# Patient Record
Sex: Male | Born: 1990 | Race: Black or African American | Hispanic: No | Marital: Single | Smoking: Never smoker
Health system: Southern US, Community
[De-identification: ages and names within clinical notes are randomized; demographics above are authoritative.]

## PROBLEM LIST (undated history)

## (undated) DIAGNOSIS — R45851 Suicidal ideations: Secondary | ICD-10-CM

## (undated) DIAGNOSIS — F603 Borderline personality disorder: Secondary | ICD-10-CM

## (undated) DIAGNOSIS — F319 Bipolar disorder, unspecified: Secondary | ICD-10-CM

## (undated) DIAGNOSIS — J45909 Unspecified asthma, uncomplicated: Secondary | ICD-10-CM

## (undated) DIAGNOSIS — F913 Oppositional defiant disorder: Secondary | ICD-10-CM

## (undated) DIAGNOSIS — F909 Attention-deficit hyperactivity disorder, unspecified type: Secondary | ICD-10-CM

## (undated) DIAGNOSIS — F209 Schizophrenia, unspecified: Secondary | ICD-10-CM

## (undated) DIAGNOSIS — R45850 Homicidal ideations: Secondary | ICD-10-CM

---

## 2013-07-01 ENCOUNTER — Encounter (HOSPITAL_COMMUNITY): Payer: Self-pay | Admitting: Emergency Medicine

## 2013-07-01 ENCOUNTER — Emergency Department (HOSPITAL_COMMUNITY)
Admission: EM | Admit: 2013-07-01 | Discharge: 2013-07-01 | Disposition: A | Discharge status: Home / Self Care | Payer: Medicaid Other | Attending: Emergency Medicine | Admitting: Emergency Medicine

## 2013-07-01 DIAGNOSIS — J45909 Unspecified asthma, uncomplicated: Secondary | ICD-10-CM | POA: Diagnosis not present

## 2013-07-01 DIAGNOSIS — M545 Low back pain, unspecified: Secondary | ICD-10-CM | POA: Diagnosis present

## 2013-07-01 DIAGNOSIS — G8929 Other chronic pain: Secondary | ICD-10-CM | POA: Insufficient documentation

## 2013-07-01 DIAGNOSIS — R52 Pain, unspecified: Secondary | ICD-10-CM | POA: Diagnosis not present

## 2013-07-01 HISTORY — DX: Unspecified asthma, uncomplicated: J45.909

## 2013-07-01 MED ORDER — METHOCARBAMOL 500 MG PO TABS: 1000.0000 mg | ORAL_TABLET | Freq: Four times a day (QID) | ORAL | Status: DC | PRN

## 2013-07-01 NOTE — ED Notes (Signed)
i just triaged this pt no change

## 2013-07-01 NOTE — ED Provider Notes (Signed)
CSN: [id number]     Arrival date & time 07/01/13  1701 History   First MD Initiated Contact with Patient 07/01/13 1711  This chart was scribed for non-physician practitioner Wynetta EmeryNicole Dawnisha Marquina, PA-C working with Merrie RoofJohn David Wofford III, * by Valera CastleSteven Perry, ED scribe. This patient was seen in room TR05C/TR05C and the patient's care was started at 5:21 PM.    Chief Complaint  Patient presents with  . Back Pain   (Consider location/radiation/quality/duration/timing/severity/associated sxs/prior Treatment) The history is provided by the patient. No language interpreter was used.   HPI Comments: [name] is a 23 y.o. male who presents to the Emergency Department complaining of 8/10, sharp, pulling, lower back pain, onset 3 weeks ago. He reports he used to have back pain, but states that it had subsided for some time. He denies his back pain radiating to his LE. He denies any recent injuries and trauma to his back. He reports taking Motrin for his pain without relief. He denies any associated symptoms. He denies allergies to medications. He denies having a PCP due to not having insurance. He reports being in the process of applying for insurance. Denies fever, chills, change in bowel or bladder habits, h/o IDVU or cancer, numbness or weakness.   PCP - No PCP Per Patient  Past Medical History  Diagnosis Date  . Asthma    History reviewed. No pertinent past surgical history. No family history on file. History  Substance Use Topics  . Smoking status: Never Smoker   . Smokeless tobacco: Not on file  . Alcohol Use: Yes    Review of Systems  Musculoskeletal: Positive for back pain (lower). Negative for gait problem, joint swelling and neck pain.  Skin: Negative for wound.   Allergies  Review of patient's allergies indicates no known allergies.  Home Medications   Prior to Admission medications   Not on File   BP 148/68  Pulse 86  Temp(Src) 98.7 F (37.1 C) (Oral)  Resp 18  Wt 220  lb (99.791 kg)  SpO2 98%  Physical Exam  Nursing note and vitals reviewed. Constitutional: He is oriented to person, place, and time. He appears well-developed and well-nourished. No distress.  HENT:  Head: Normocephalic and atraumatic.  Mouth/Throat: Oropharynx is clear and moist.  Eyes: EOM are normal. Pupils are equal, round, and reactive to light.  Neck: Normal range of motion. Neck supple.  Cardiovascular: Normal rate, regular rhythm and intact distal pulses.   Pulmonary/Chest: Effort normal. No respiratory distress.  Musculoskeletal: Normal range of motion. He exhibits no edema.  No point tenderness to percussion of lumbar spinal processes.  No TTP or paraspinal muscular spasm. Strength is 5 out of 5 to bilateral lower extremities at hip and knee No saddle anaesthesia.     Ambulates with non-antalgic gait   Neurological: He is alert and oriented to person, place, and time.  Skin: Skin is warm and dry.  Psychiatric: He has a normal mood and affect. His behavior is normal.    ED Course  Procedures (including critical care time)  DIAGNOSTIC STUDIES: Oxygen Saturation is 98% on room air, normal by my interpretation.    COORDINATION OF CARE: 5:24 PM-Discussed treatment plan which includes pain medication, muscle relaxant, and [facility] bedside and pt agreed to plan.   Labs Review Labs Reviewed - No data to display  Imaging Review No results found.   EKG Interpretation None     Medications - No data  to display MDM   Final diagnoses:  Acute exacerbation of chronic low back pain    Filed Vitals:   07/01/13 1709  BP: 148/68  Pulse: 86  Temp: 98.7 F (37.1 C)  TempSrc: Oral  Resp: 18  Weight: 220 lb (99.791 kg)  SpO2: 98%    Medications - No data to display  [name] is a 23 y.o. male presenting with  back pain.  Declines Toradol. No neurological deficits and normal neuro exam.  Patient can walk but states is painful.   No loss of bowel or bladder control.  No concern for cauda equina.  No fever, night sweats, weight loss, h/o cancer, IVDU.  RICE protocol and pain medicine indicated and discussed with patient.  Evaluation does not show pathology that would require ongoing emergent intervention or inpatient treatment. Pt is hemodynamically stable and mentating appropriately. Discussed findings and plan with patient/guardian, who agrees with care plan. All questions answered. Return precautions discussed and outpatient follow up given.    Note: Portions of this report may have been transcribed using voice recognition software. Every effort was made to ensure accuracy; however, inadvertent computerized transcription errors may be present   I personally performed the services described in this documentation, which was scribed in my presence. The recorded information has been reviewed and is accurate.   Wynetta Emeryicole Evelynn Hench, PA-C 07/01/13 1746

## 2013-07-01 NOTE — ED Notes (Signed)
The pt has had lower back pain for 3 weeks.  No known injury

## 2013-07-01 NOTE — Discharge Instructions (Signed)
For pain control you may take up to 800mg of Motrin (also known as ibuprofen). That is usually 4 over the counter pills,  3 times a day. Take with food to minimize stomach irritation  ° °You can also take  tylenol (acetaminophen) 975mg (this is 3 over the counter pills) four times a day. Do not drink alcohol or combine with other medications that have acetaminophen as an ingredient (Read the labels!).   ° °For breakthrough pain you may take Robaxin. Do not drink alcohol, drive or operate heavy machinery when taking Robaxin. ° °Do not hesitate to return to the Emergency Department for any new, worsening or concerning symptoms.  ° °If you do not have a primary care doctor you can establish one at the  ° °[facility]: °[address] °[place] [address] °[phone] ° °After you establish care. Let them know you were seen in the emergency room. They must obtain records for further management.  ° °

## 2013-07-02 ENCOUNTER — Emergency Department (HOSPITAL_COMMUNITY)
Admission: EM | Admit: 2013-07-02 | Discharge: 2013-07-02 | Disposition: A | Discharge status: Home / Self Care | Payer: Medicaid Other | Attending: Emergency Medicine | Admitting: Emergency Medicine

## 2013-07-02 ENCOUNTER — Encounter (HOSPITAL_COMMUNITY): Payer: Self-pay | Admitting: Emergency Medicine

## 2013-07-02 DIAGNOSIS — M545 Low back pain, unspecified: Secondary | ICD-10-CM | POA: Diagnosis not present

## 2013-07-02 DIAGNOSIS — R51 Headache: Secondary | ICD-10-CM | POA: Diagnosis not present

## 2013-07-02 DIAGNOSIS — R52 Pain, unspecified: Secondary | ICD-10-CM | POA: Diagnosis not present

## 2013-07-02 DIAGNOSIS — R110 Nausea: Secondary | ICD-10-CM | POA: Insufficient documentation

## 2013-07-02 DIAGNOSIS — J45909 Unspecified asthma, uncomplicated: Secondary | ICD-10-CM | POA: Insufficient documentation

## 2013-07-02 DIAGNOSIS — M549 Dorsalgia, unspecified: Secondary | ICD-10-CM

## 2013-07-02 DIAGNOSIS — G8929 Other chronic pain: Secondary | ICD-10-CM | POA: Diagnosis not present

## 2013-07-02 DIAGNOSIS — R519 Headache, unspecified: Secondary | ICD-10-CM

## 2013-07-02 DIAGNOSIS — H53149 Visual discomfort, unspecified: Secondary | ICD-10-CM | POA: Insufficient documentation

## 2013-07-02 LAB — COMPREHENSIVE METABOLIC PANEL
ALK PHOS: 67 U/L (ref 39–117)
ALT: 50 U/L (ref 0–53)
AST: 41 U/L — AB (ref 0–37)
Albumin: 4 g/dL (ref 3.5–5.2)
BILIRUBIN TOTAL: 0.3 mg/dL (ref 0.3–1.2)
BUN: 9 mg/dL (ref 6–23)
CO2: 24 mEq/L (ref 19–32)
CREATININE: 1 mg/dL (ref 0.50–1.35)
Calcium: 9.1 mg/dL (ref 8.4–10.5)
Chloride: 100 mEq/L (ref 96–112)
GFR calc Af Amer: >90 mL/min (ref >90)
GFR calc non Af Amer: >90 mL/min (ref >90)
Glucose, Bld: 79 mg/dL (ref 70–99)
POTASSIUM: 4.6 meq/L (ref 3.7–5.3)
Sodium: 137 mEq/L (ref 137–147)
Total Protein: 7.7 g/dL (ref 6.0–8.3)

## 2013-07-02 LAB — CBC WITH DIFFERENTIAL/PLATELET
BASOS ABS: 0 10*3/uL (ref 0.0–0.1)
BASOS PCT: 0 % (ref 0–1)
Eosinophils Absolute: 0.3 10*3/uL (ref 0.0–0.7)
Eosinophils Relative: 5 % (ref 0–5)
HCT: 46.2 % (ref 39.0–52.0)
Hemoglobin: 15.7 g/dL (ref 13.0–17.0)
Lymphocytes Relative: 37 % (ref 12–46)
Lymphs Abs: 1.9 10*3/uL (ref 0.7–4.0)
MCH: 27.7 pg (ref 26.0–34.0)
MCHC: 34 g/dL (ref 30.0–36.0)
MCV: 81.6 fL (ref 78.0–100.0)
Monocytes Absolute: 0.6 10*3/uL (ref 0.1–1.0)
Monocytes Relative: 11 % (ref 3–12)
NEUTROS ABS: 2.5 10*3/uL (ref 1.7–7.7)
NEUTROS PCT: 47 % (ref 43–77)
Platelets: 311 10*3/uL (ref 150–400)
RBC: 5.66 MIL/uL (ref 4.22–5.81)
RDW: 13.2 % (ref 11.5–15.5)
WBC: 5.2 10*3/uL (ref 4.0–10.5)

## 2013-07-02 LAB — URINALYSIS, ROUTINE W REFLEX MICROSCOPIC
Bilirubin Urine: NEGATIVE
Glucose, UA: NEGATIVE mg/dL
Hgb urine dipstick: NEGATIVE
KETONES UR: NEGATIVE mg/dL
Leukocytes, UA: NEGATIVE
Nitrite: NEGATIVE
PH: 6.5 (ref 5.0–8.0)
Protein, ur: NEGATIVE mg/dL
SPECIFIC GRAVITY, URINE: 1.017 (ref 1.005–1.030)
Urobilinogen, UA: 1 mg/dL (ref 0.0–1.0)

## 2013-07-02 LAB — LIPASE, BLOOD: Lipase: 20 U/L (ref 11–59)

## 2013-07-02 MED ORDER — PROMETHAZINE HCL 25 MG PO TABS: 25.0000 mg | ORAL_TABLET | Freq: Four times a day (QID) | ORAL | Status: DC | PRN

## 2013-07-02 MED ORDER — KETOROLAC TROMETHAMINE 60 MG/2ML IM SOLN
60.0000 mg | Freq: Once | INTRAMUSCULAR | Status: AC
  Administered 2013-07-02: 60 mg via INTRAMUSCULAR
  Filled 2013-07-02: qty 2

## 2013-07-02 MED ORDER — ONDANSETRON 4 MG PO TBDP
8.0000 mg | ORAL_TABLET | Freq: Once | ORAL | Status: AC
  Administered 2013-07-02: 8 mg via ORAL
  Filled 2013-07-02: qty 2

## 2013-07-02 NOTE — ED Provider Notes (Signed)
CSN: [id number]     Arrival date & time 07/02/13  1846 History   First MD Initiated Contact with Patient 07/02/13 2008     Chief Complaint  Patient presents with  . Headache  . Back Pain  . Nausea     (Consider location/radiation/quality/duration/timing/severity/associated sxs/prior Treatment) HPI History provided by pt.   Pt presents w/ multiple complaints.  Has acute on chronic, non-traumatic pain mid-back that radiates down to his buttocks.  Pain typical.  No relief w/ robaxin that was prescribed in ER yesterday for symptomatic treatment of same complaint.  Also c/o constant  frontal/occipital headache since this morning.  Gradual onset.  Associated w/ photophobia and nausea as well as nasal congestion.  Had dizziness at onset as well as abdominal pain this morning,  But both have resolved.  Denies vision changes, extremity weakness/paresthesias, urinary sx, bowel dysfunction, sore throat and cough.  No other pertinent PMH.  Past Medical History  Diagnosis Date  . Asthma    History reviewed. No pertinent past surgical history. No family history on file. History  Substance Use Topics  . Smoking status: Never Smoker   . Smokeless tobacco: Not on file  . Alcohol Use: Yes    Review of Systems  All other systems reviewed and are negative.     Allergies  Review of patient's allergies indicates no known allergies.  Home Medications   Prior to Admission medications   Medication Sig Start Date End Date Taking? Authorizing Provider  methocarbamol (ROBAXIN) 500 MG tablet Take 2 tablets (1,000 mg total) by mouth 4 (four) times daily as needed (Pain). 07/01/13  Yes Nicole Pisciotta, PA-C   BP 120/56  Pulse 80  Temp(Src) 98.9 F (37.2 C) (Oral)  Resp 20  SpO2 100% Physical Exam  Nursing note and vitals reviewed. Constitutional: He is oriented to person, place, and time. He appears well-developed and well-nourished.  HENT:  Head: Normocephalic and atraumatic.  Mild tenderness of  frontal and maxillary sinuses  Eyes:  Normal appearance  Neck: Normal range of motion. Neck supple. No rigidity. No Brudzinski's sign and no Kernig's sign noted.  Cardiovascular: Normal rate, regular rhythm and intact distal pulses.   Pulmonary/Chest: Effort normal and breath sounds normal.  Musculoskeletal: Normal range of motion.  5/5 hip abduction/adduction and ankle plantar/dorsiflexion strength.  Nml patellar reflexes.  No saddle anesthesia.  Lymphadenopathy:    He has no cervical adenopathy.  Neurological: He is alert and oriented to person, place, and time. No sensory deficit. Coordination normal.  CN 3-12 intact.  No nystagmus.  5/5 and equal upper and lower extremity strength.  No past pointing.    Skin: Skin is warm and dry. No rash noted.  Psychiatric: He has a normal mood and affect. His behavior is normal.    ED Course  Procedures (including critical care time) Labs Review Labs Reviewed  COMPREHENSIVE METABOLIC PANEL - Abnormal; Notable for the following:    AST 41 (*)    All other components within normal limits  CBC WITH DIFFERENTIAL  LIPASE, BLOOD  URINALYSIS, ROUTINE W REFLEX MICROSCOPIC    Imaging Review No results found.   EKG Interpretation None      MDM   Final diagnoses:  Headache  Chronic back pain    23yo M w/ asthma and chronic back pain, presents w/ typical acute on chronic back pain as well as gradual onset headache and nausea since this morning.  Seen for back pain in ED yesterday and prescribed robaxin for  symptomatic treatment.  Has not had relief w/ this medication.  On exam, afebrile, NAD and non-toxic appearing, mild sinus ttp, no focal neuro deficits or meningeal signs, abd benign, diffuse, mild tenderness mid-low back.  History and exam are not concerning for central cord, cauda equina, CNS infection, intracranial hemorrhage.  Will treat symptomatically for headache and back pain w/ IM toradol and nausea w/ Zofran ODT and reassess shortly.   8:57 PM   Headache and nausea resolved.  Pt d/c'd home w/ phenergan for nausea and to be taken with benadryl to trial for headache.  Referred to [facility] for chronic back pain.  Return precautions discussed. 10:03 PM   Otilio Miuatherine E Lailani Tool, PA-C 07/03/13 [phone]

## 2013-07-02 NOTE — ED Provider Notes (Signed)
Medical screening examination/treatment/procedure(s) were performed by non-physician practitioner and as supervising physician I was immediately available for consultation/collaboration.   EKG Interpretation None        [name] ChurnJohn David Laytoya Ion III, MD 07/02/13 0001

## 2013-07-02 NOTE — Discharge Instructions (Signed)
Take phenergan as needed for nausea.  If you take with benadryl, it may give you headache relief as well, but this may make you sleepy.  Apply a heating pad to sore back muscles and take an anti-inflammatory such as 800mg  ibuprofen for pain.  Avoid activities that aggravate pain.   Follow up with the neurosurgeon you have been referred to.  Return to the ER if headache worsens, you have uncontrolled vomiting, change from typical back pain, associated fever, inability to walk due to leg weakness or loss of control of bladder/bowels.

## 2013-07-02 NOTE — ED Notes (Signed)
Pt here with multiple complaints. Reports 10/10 progressively worsening HA x 3 hours; denies taking anything. Denies blurred vision, double vision but feels "lightheaded". Ambulatory to triage. Pt also has chronic back pain, seen here yesterday for same but states pain medications didn't work. Pt also reports lower abdominal pain starting 3 hours ago with nausea. NAD. Pt AO x 4. PERRLA.

## 2013-07-06 NOTE — ED Provider Notes (Signed)
Medical screening examination/treatment/procedure(s) were performed by non-physician practitioner and as supervising physician I was immediately available for consultation/collaboration.   EKG Interpretation None        Rito Lecomte W. Berkeley Veldman, MD 07/06/13 0337 

## 2013-07-30 ENCOUNTER — Encounter (HOSPITAL_COMMUNITY): Payer: Self-pay | Admitting: Emergency Medicine

## 2013-07-30 ENCOUNTER — Emergency Department (HOSPITAL_COMMUNITY)
Admission: EM | Admit: 2013-07-30 | Discharge: 2013-08-03 | Disposition: A | Discharge status: Home / Self Care | Payer: Medicaid Other | Attending: Emergency Medicine | Admitting: Emergency Medicine

## 2013-07-30 DIAGNOSIS — R443 Hallucinations, unspecified: Secondary | ICD-10-CM | POA: Insufficient documentation

## 2013-07-30 DIAGNOSIS — J45909 Unspecified asthma, uncomplicated: Secondary | ICD-10-CM | POA: Diagnosis not present

## 2013-07-30 DIAGNOSIS — Z8659 Personal history of other mental and behavioral disorders: Secondary | ICD-10-CM | POA: Insufficient documentation

## 2013-07-30 HISTORY — DX: Bipolar disorder, unspecified: F31.9

## 2013-07-30 HISTORY — DX: Schizophrenia, unspecified: F20.9

## 2013-07-30 HISTORY — DX: Oppositional defiant disorder: F91.3

## 2013-07-30 HISTORY — DX: Attention-deficit hyperactivity disorder, unspecified type: F90.9

## 2013-07-30 LAB — COMPREHENSIVE METABOLIC PANEL
ALK PHOS: 65 U/L (ref 39–117)
ALT: 22 U/L (ref 0–53)
AST: 22 U/L (ref 0–37)
Albumin: 4.2 g/dL (ref 3.5–5.2)
BILIRUBIN TOTAL: 0.6 mg/dL (ref 0.3–1.2)
BUN: 12 mg/dL (ref 6–23)
CHLORIDE: 101 meq/L (ref 96–112)
CO2: 28 mEq/L (ref 19–32)
Calcium: 9.6 mg/dL (ref 8.4–10.5)
Creatinine, Ser: 0.96 mg/dL (ref 0.50–1.35)
GLUCOSE: 96 mg/dL (ref 70–99)
POTASSIUM: 4 meq/L (ref 3.7–5.3)
Sodium: 140 mEq/L (ref 137–147)
Total Protein: 7.7 g/dL (ref 6.0–8.3)

## 2013-07-30 LAB — CBC
HCT: 43.6 % (ref 39.0–52.0)
HEMOGLOBIN: 14.9 g/dL (ref 13.0–17.0)
MCH: 27.6 pg (ref 26.0–34.0)
MCHC: 34.2 g/dL (ref 30.0–36.0)
MCV: 80.9 fL (ref 78.0–100.0)
Platelets: 288 10*3/uL (ref 150–400)
RBC: 5.39 MIL/uL (ref 4.22–5.81)
RDW: 12.8 % (ref 11.5–15.5)
WBC: 4.8 10*3/uL (ref 4.0–10.5)

## 2013-07-30 LAB — ETHANOL: Alcohol, Ethyl (B): <11 mg/dL (ref 0–11)

## 2013-07-30 LAB — RAPID URINE DRUG SCREEN, HOSP PERFORMED
AMPHETAMINES: NOT DETECTED
BARBITURATES: NOT DETECTED
Benzodiazepines: NOT DETECTED
Cocaine: NOT DETECTED
Opiates: NOT DETECTED
TETRAHYDROCANNABINOL: NOT DETECTED

## 2013-07-30 LAB — SALICYLATE LEVEL: Salicylate Lvl: <2 mg/dL — ABNORMAL LOW (ref 2.8–20.0)

## 2013-07-30 LAB — ACETAMINOPHEN LEVEL: Acetaminophen (Tylenol), Serum: <15 ug/mL (ref 10–30)

## 2013-07-30 MED ORDER — IBUPROFEN 400 MG PO TABS
600.0000 mg | ORAL_TABLET | Freq: Three times a day (TID) | ORAL | Status: DC | PRN
  Administered 2013-08-01 – 2013-08-03 (×5): 600 mg via ORAL
  Filled 2013-07-30 (×10): qty 1

## 2013-07-30 MED ORDER — ACETAMINOPHEN 325 MG PO TABS
650.0000 mg | ORAL_TABLET | ORAL | Status: DC | PRN
  Administered 2013-08-01: 650 mg via ORAL
  Filled 2013-07-30: qty 2

## 2013-07-30 NOTE — ED Notes (Signed)
Trained Sitters switched. New sitter at the bedside. Sitter taking patient to the bathroom and instructed to get a urine sample.
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
## 2013-07-30 NOTE — ED Provider Notes (Signed)
CSN: [id number]     Arrival date & time 07/30/13  1713 History   First MD Initiated Contact with Patient 07/30/13 1745     Chief Complaint  Patient presents with  . Hallucinations     (Consider location/radiation/quality/duration/timing/severity/associated sxs/prior Treatment) HPI  Pt is a 23 yo M with hx of bipolar w/ psychotic features, schizophrenia, ADD, ODD who presents with visual and auditory hallucinations. Hallucinations began earlier today. He has seen two people, named [place] and [place]. They tell him to go and get bombs and guns and go to a school to kill people. He denies any thoughts of actually wanting to do this himself. He denies thoughts of hurting himself or others. He used to cut his wrists. He states he was last hospitalized for psychiatric issues in [place] about 3 months ago. He previously took multiple psychiatric medications, including Adderall, clonidine, respiratory, and Depakote. He most recently was on Abilify but has not taken this in 2 months. He has a Therapist, sports and psychologist here in [place] and last saw them about 2 months ago.  Past Medical History  Diagnosis Date  . Asthma   . Bipolar 1 disorder   . ODD (oppositional defiant disorder)   . ADHD (attention deficit hyperactivity disorder)    History reviewed. No pertinent past surgical history. History reviewed. No pertinent family history. History  Substance Use Topics  . Smoking status: Never Smoker   . Smokeless tobacco: Not on file  . Alcohol Use: Yes    Review of Systems  Psychiatric/Behavioral: Positive for hallucinations. Negative for suicidal ideas.  All other systems reviewed and are negative.     Allergies  Review of patient's allergies indicates no known allergies.  Home Medications   Prior to Admission medications   Not on File   BP 129/85  Pulse 95  Temp(Src) 98.1 F (36.7 C) (Oral)  Resp 18  Ht 5\' 10"  (1.778 m)  Wt 220 lb (99.791 kg)  BMI 31.57 kg/m2  SpO2  95% Physical Exam  Constitutional: He appears well-developed and well-nourished.  HENT:  Head: Normocephalic and atraumatic.  Mouth/Throat: Oropharynx is clear and moist.  Eyes: Pupils are equal, round, and reactive to light.  Cardiovascular: Normal rate, regular rhythm and normal heart sounds.   Pulmonary/Chest: Effort normal and breath sounds normal.  Abdominal: Soft. He exhibits no mass. There is no tenderness. There is no guarding.  Neurological: He is alert. He has normal strength.  Psychiatric: He is actively hallucinating. He is not agitated, not aggressive and not combative. He expresses no homicidal and no suicidal ideation.    ED Course  Procedures (including critical care time) Labs Review Labs Reviewed  SALICYLATE LEVEL - Abnormal; Notable for the following:    Salicylate Lvl <2.0 (*)    All other components within normal limits  ACETAMINOPHEN LEVEL  CBC  COMPREHENSIVE METABOLIC PANEL  ETHANOL  URINE RAPID DRUG SCREEN (HOSP PERFORMED)    Imaging Review No results found.   EKG Interpretation None      MDM   Final diagnoses:  Hallucinations    23 yo M with hx of schizophrenia, bipolar w/ psychotic features, and other psychiatric problems who presents with active hallucinations in setting of not taking any psychiatric medicines for the last two months. No acute medical issues, and labwork is unremarkable. Medically cleared for psychiatric evaluation.  Levert Feinstein, MD Family Medicine PGY-2   Latrelle Dodrill, MD 07/30/13 [phone]

## 2013-07-30 NOTE — ED Notes (Signed)
AC and security called about pt status.

## 2013-07-30 NOTE — ED Notes (Signed)
Dr. Plunkett at the bedside.  

## 2013-07-30 NOTE — ED Notes (Signed)
Pt reports that today he started hearing and seeing things that he knows are not there. Reports that the voices tell him to hurt others around him. No SI. Denies any drug or alcohol abuse. No other complaints.

## 2013-07-30 NOTE — ED Notes (Signed)
Psychologist, occupational at the bedside.

## 2013-07-30 NOTE — ED Notes (Signed)
Patient reports having hallucinations. Patient states, "I see humans that no one else can see and they tell me to kill people. For instance, they do not like policemen and they tell me to kill policemen. When I am on my medication, I have nightmares and they threaten to hurt me if I do not get off my medication. I have not been on my medication for two months."

## 2013-07-30 NOTE — ED Notes (Signed)
Jane, NT at the bedside sitting with patient.

## 2013-07-31 ENCOUNTER — Encounter (HOSPITAL_COMMUNITY): Payer: Self-pay | Admitting: Emergency Medicine

## 2013-07-31 MED ORDER — ARIPIPRAZOLE 15 MG PO TABS
15.0000 mg | ORAL_TABLET | Freq: Every day | ORAL | Status: DC
  Administered 2013-07-31 – 2013-08-01 (×2): 15 mg via ORAL
  Filled 2013-07-31 (×3): qty 1

## 2013-07-31 MED ORDER — LORAZEPAM 1 MG PO TABS
1.0000 mg | ORAL_TABLET | Freq: Four times a day (QID) | ORAL | Status: DC | PRN
  Administered 2013-07-31 – 2013-08-02 (×4): 1 mg via ORAL
  Filled 2013-07-31 (×6): qty 1

## 2013-07-31 NOTE — BH Assessment (Signed)
Per Keneisha Herbin, AC at [facility], adult unit is at capacity. Contacted the following facilities for placement:  BED AVAILABLE. FAXED CLINICAL INFORMATION: [name]: Per Paula [facility]: Per Maria [facility]: Per Debbie  AT CAPACITY: [facility]: Per [facility]: Per Carla [facility]: Per [facility]: Per Gis[facility]: Per [facility]: Per [facility]: Per [facility]: Per Cedric [facility]: Per [facility]: Per Dana [facility]: Per Kim [facility]: Per Kathleen   Pawan Knechtel Ellis Areal Cochrane Jr, LPC, [facility] Triage Specialist [phone]  

## 2013-07-31 NOTE — ED Provider Notes (Signed)
I saw and evaluated the patient, reviewed the resident's note and I agree with the findings and plan.   EKG Interpretation None      Patient with a history of bipolar disease has been off of his meds and has a auditory hallucinations telling him to hurt others.  He is currently calm and cooperative. Medically clear. TTS to evaluate  Gwyneth Sprout, MD 07/31/13 [phone]

## 2013-07-31 NOTE — ED Notes (Signed)
Resume sitter position

## 2013-07-31 NOTE — ED Notes (Signed)
SPOKE TO  [facility] AND OBTAINED PT ABILIFY DOSE.

## 2013-08-01 ENCOUNTER — Encounter (HOSPITAL_COMMUNITY): Payer: Self-pay | Admitting: Emergency Medicine

## 2013-08-01 LAB — CBG MONITORING, ED: Glucose-Capillary: 116 mg/dL — ABNORMAL HIGH (ref 70–99)

## 2013-08-01 NOTE — ED Notes (Signed)
MD at bedside. 

## 2013-08-01 NOTE — B[facility] Counselor (Addendum)
Tele-reassessment scheduled for 0820 with Becky.  Per Earmon Phoenix, Extender, continue to seek placement.

## 2013-08-01 NOTE — BH Assessment (Signed)
PT was reassessed on this date.  PT denied current AH, command voices, and VH and he reported last episode was yesterday.  He denied current SI, HI, and self harm.  He reported not having an appetite, however he did eat a "little breakfast" this morning.  PT reported  sleeping "okay" last night and waking up at 0700 or 0800 this morning.  He was unsure of his current mood and reported needing to go to another hospital so that he can receive medication in order for [facility] and [facility] not to return.  The PT presented with a flat affect orientated x3.

## 2013-08-01 NOTE — Progress Notes (Signed)
Weekend CSW attempting to contact [facility] regarding disposition plan.   Priyana Mccarey, MSW, LCSWA  Clinical Social Worker   Emergency Dept.  [phone]   

## 2013-08-02 MED ORDER — ARIPIPRAZOLE 15 MG PO TABS
15.0000 mg | ORAL_TABLET | Freq: Every day | ORAL | Status: DC
  Administered 2013-08-02: 15 mg via ORAL
  Filled 2013-08-02 (×3): qty 1

## 2013-08-02 MED ORDER — KETOTIFEN FUMARATE 0.025 % OP SOLN
1.0000 [drp] | Freq: Two times a day (BID) | OPHTHALMIC | Status: DC
  Administered 2013-08-02 – 2013-08-03 (×2): 1 [drp] via OPHTHALMIC
  Filled 2013-08-02: qty 5

## 2013-08-02 NOTE — Progress Notes (Signed)
Per Carlis Abbott at [facility] bed available, referral faxed.  Follow-up calls were placed to the following facilities:  [facility]- per Merita Norton currently at capacity but my have d/c's later and will be accepting from wait list at that time [facility]- per Olegario Messier at capacity [facility]- per Joyce Gross at [employer] Disposition [facility]

## 2013-08-02 NOTE — BH Assessment (Addendum)
[facility] Assessment Progress Note Completed reassessment of pt.  Pt states that he continues to see people and hear voices telling him to harm others, but he would never act on it.  Pt says he thinks he needs to go into the hospital and get back on his meds to feel better.  Spoke with pt's mom, who says that pt is refusing his meds and thinks he should not have to take them since he is now 23 yrs old. She said he was doing well on his meds when d/c'd from the hospital 3 months ago and she wants him to get started back on meds.  Pt has been to [facility] 2x in past 3 months, and mom says they have been waiting to get [facility] record [facility] to re-start him on some meds. She now has that information and says she will take it to [facility] to get it entered into the system.  Mom says that he does not sleep much, paces around, talks and laughs to himself, eats a lot and has gained a lot of weight recently.  She says he has been in and out of hospitals since he was 23 y/o--mostly in the hospital.  She says he sometimes thinks people are saying things about him that they are not, and got in a fight with his brother last week because of that.  Pt's brothers are 70 and 63, living in the home. Mom also says pt gets in occasional fights with step-dad.  Spoke with Julieanne Cotton, NP to assess if pt currently meets criteria for IP, and she says he does and that EDP can get him back started on his medications when mom brings the information over.  TTS will continue to pursue IP placement.

## 2013-08-02 NOTE — ED Notes (Signed)
New sitter, Hazel, CNA, oriented to environment and discussed plan of care with her.

## 2013-08-02 NOTE — ED Provider Notes (Signed)
Patient states he has chronic intermittent itching of his eyes and the like it drops to help with the itching of his eyes he is no conjunctival injection no conjunctival discharge no change in vision no eye pain extraocular movements are intact peripheral vision fields are full to confrontation no shortness of breath no nasal congestion so Zaditor eye drops ordered.  Hurman Horn, MD 08/04/13 0001

## 2013-08-03 ENCOUNTER — Emergency Department (HOSPITAL_COMMUNITY)
Admission: EM | Admit: 2013-08-03 | Discharge: 2013-08-04 | Disposition: A | Discharge status: Other Acute Inpatient Hospital | Payer: Medicaid Other | Source: Home / Self Care | Attending: Emergency Medicine | Admitting: Emergency Medicine

## 2013-08-03 ENCOUNTER — Encounter (HOSPITAL_COMMUNITY): Payer: Self-pay | Admitting: Emergency Medicine

## 2013-08-03 DIAGNOSIS — IMO0002 Reserved for concepts with insufficient information to code with codable children: Secondary | ICD-10-CM

## 2013-08-03 DIAGNOSIS — F319 Bipolar disorder, unspecified: Secondary | ICD-10-CM | POA: Insufficient documentation

## 2013-08-03 DIAGNOSIS — S50812A Abrasion of left forearm, initial encounter: Secondary | ICD-10-CM

## 2013-08-03 DIAGNOSIS — Z79899 Other long term (current) drug therapy: Secondary | ICD-10-CM

## 2013-08-03 DIAGNOSIS — J45909 Unspecified asthma, uncomplicated: Secondary | ICD-10-CM

## 2013-08-03 DIAGNOSIS — X789XXA Intentional self-harm by unspecified sharp object, initial encounter: Secondary | ICD-10-CM | POA: Insufficient documentation

## 2013-08-03 DIAGNOSIS — F209 Schizophrenia, unspecified: Secondary | ICD-10-CM

## 2013-08-03 DIAGNOSIS — Z7289 Other problems related to lifestyle: Secondary | ICD-10-CM

## 2013-08-03 DIAGNOSIS — Z8659 Personal history of other mental and behavioral disorders: Secondary | ICD-10-CM

## 2013-08-03 LAB — CBC
HEMATOCRIT: 45.2 % (ref 39.0–52.0)
Hemoglobin: 16 g/dL (ref 13.0–17.0)
MCH: 28.2 pg (ref 26.0–34.0)
MCHC: 35.4 g/dL (ref 30.0–36.0)
MCV: 79.7 fL (ref 78.0–100.0)
Platelets: 301 10*3/uL (ref 150–400)
RBC: 5.67 MIL/uL (ref 4.22–5.81)
RDW: 12.5 % (ref 11.5–15.5)
WBC: 6.4 10*3/uL (ref 4.0–10.5)

## 2013-08-03 LAB — ACETAMINOPHEN LEVEL: Acetaminophen (Tylenol), Serum: <15 ug/mL (ref 10–30)

## 2013-08-03 LAB — COMPREHENSIVE METABOLIC PANEL
ALBUMIN: 4.8 g/dL (ref 3.5–5.2)
ALK PHOS: 67 U/L (ref 39–117)
ALT: 19 U/L (ref 0–53)
AST: 23 U/L (ref 0–37)
BUN: 7 mg/dL (ref 6–23)
CHLORIDE: 97 meq/L (ref 96–112)
CO2: 27 mEq/L (ref 19–32)
CREATININE: 1.07 mg/dL (ref 0.50–1.35)
Calcium: 9.5 mg/dL (ref 8.4–10.5)
GFR calc Af Amer: >90 mL/min (ref >90)
GFR calc non Af Amer: >90 mL/min (ref >90)
Glucose, Bld: 90 mg/dL (ref 70–99)
Potassium: 3.9 mEq/L (ref 3.7–5.3)
Sodium: 138 mEq/L (ref 137–147)
Total Bilirubin: 0.5 mg/dL (ref 0.3–1.2)
Total Protein: 8.5 g/dL — ABNORMAL HIGH (ref 6.0–8.3)

## 2013-08-03 LAB — ETHANOL: Alcohol, Ethyl (B): <11 mg/dL (ref 0–11)

## 2013-08-03 LAB — SALICYLATE LEVEL

## 2013-08-03 MED ORDER — LORAZEPAM 1 MG PO TABS
1.0000 mg | ORAL_TABLET | Freq: Once | ORAL | Status: AC
  Administered 2013-08-03: 1 mg via ORAL

## 2013-08-03 MED ORDER — LORAZEPAM 1 MG PO TABS
1.0000 mg | ORAL_TABLET | Freq: Three times a day (TID) | ORAL | Status: DC | PRN
  Administered 2013-08-04: 1 mg via ORAL
  Filled 2013-08-03: qty 1

## 2013-08-03 MED ORDER — IBUPROFEN 400 MG PO TABS
600.0000 mg | ORAL_TABLET | Freq: Three times a day (TID) | ORAL | Status: DC | PRN
  Administered 2013-08-04: 600 mg via ORAL
  Filled 2013-08-03 (×2): qty 1

## 2013-08-03 MED ORDER — ARIPIPRAZOLE 15 MG PO TABS: 15.0000 mg | ORAL_TABLET | Freq: Every day | ORAL | Status: DC

## 2013-08-03 MED ORDER — ACETAMINOPHEN 325 MG PO TABS
650.0000 mg | ORAL_TABLET | ORAL | Status: DC | PRN
  Administered 2013-08-04: 650 mg via ORAL
  Filled 2013-08-03: qty 2

## 2013-08-03 NOTE — ED Notes (Signed)
Patient moved up to the nursing station to sit in chair with sitter present as well.

## 2013-08-03 NOTE — ED Notes (Signed)
Patient requested drink. Gave patient sprite to drink.

## 2013-08-03 NOTE — ED Notes (Signed)
Pt bit his right hand and broke the skin.

## 2013-08-03 NOTE — ED Notes (Signed)
At the bedside with the patient to discuss what hallucinations are saying.  They are currently telling him to bite himself, and he reports that "I probably will".  Reviewed ways to cope with voices while awaiting medication.  Patient verbalizes that drinking was an old coping mechanism, but that he can play sports on game system, and watch TV to ignore when hallucinations start.

## 2013-08-03 NOTE — ED Notes (Signed)
Pt. Asked to take a shower.   Pt. Given a change of scrubs and bed linens changed.  Pt. Back in the room,  When asked pt. What is wrong, pt. Stated, "I do not want someone watching me take a shower"  Offered pt. To have a male sitter with him and he stated, "I don't want anyone in there."  Explained to pt. Due to his behaviors this morning, pt. Biting one self and attempting to cut himself with the foil from the applesauce, that I cannot let him be alone.  Explained to pt. That we have to protect him and that we have rules set up to protect all of our patients.  Pt. Is not very happy with our rules.  Reported to Dr. Patria Mane.  He will be into speak with pt.

## 2013-08-03 NOTE — ED Notes (Signed)
Patient attempting to bite himself again.  Discussed with him that if he attempts again, staff will have to keep him safe with use of restraints.  Reviewed again ways to ignore hallucinations, and at this time he will continue to sit at nursing station.

## 2013-08-03 NOTE — ED Notes (Signed)
Per Dr. Patria Mane.  Pt. Can wait out in the waiting area for his mom.

## 2013-08-03 NOTE — ED Notes (Signed)
Pt. Continues to wait for his mom,.  He called her back /.  Pt. Stated, "She is on her way."

## 2013-08-03 NOTE — ED Notes (Signed)
Patient here with suicidal ideations.  Patient states that he is hearing voices and seeing people.  Patient with history of suicidal ideations.  Patient has cut marks on anterior left forearm.

## 2013-08-03 NOTE — ED Notes (Signed)
Pt. Called his mother.  Pt. Stated, "My dad and mom are coming to pick me up. "

## 2013-08-03 NOTE — ED Notes (Signed)
MD aware of pt's behavior.

## 2013-08-03 NOTE — BH Assessment (Signed)
Per Clint Bolder, [facility], adult unit is at capacity. Contacted the following facilities for placement:  BED AVAILABLE. FAXED CLINICAL INFORMATION:  [facility]: Per Rinaldo Cloud   AT CAPACITY:  [facility]: Per [facility]: Per Earl [facility]: Per [facility]: Per [facility]: Per [facility]: Per Marchelle Folks [facility]: Per [facility]: Per La[facility]: Per [facility]: Per Ot[facility]: Per [facility]: Per Dixie  [facility]: Per [facility]: Per [facility]: Per Renae Fickle  DECLINED: [facility]: Per Bertis Ruddy, [facility], [facility] Triage Specialist [phone]

## 2013-08-03 NOTE — ED Notes (Signed)
Patient given a sandwich.

## 2013-08-03 NOTE — ED Provider Notes (Signed)
10:02 AM Patient is doing much better at this time.  He states he was having hallucinations because he was off of his Abilify.  He states that his been back on his Abilify is doing much better.  No longer having hallucinations.  He denies homicidal or suicidal thoughts.  He states he bit his hand today because he is upset and he wanted to go home and no one would listen to him.  He now is calm and compliant.  He is very pleasant to talk with.  He has good insight into his illness.  He follows that [facility] and has a Therapist, sports and psychologist there.  He states his mom is involved.  Lyanne Co, MD 08/03/13 1004

## 2013-08-03 NOTE — ED Provider Notes (Signed)
CSN: [id number]     Arrival date & time 08/03/13  2202 History   First MD Initiated Contact with Patient 08/03/13 2314     Chief Complaint  Patient presents with  . Suicidal     (Consider location/radiation/quality/duration/timing/severity/associated sxs/prior Treatment) HPI Comments: Patient is a 23 year old male with a history of bipolar 1 disorder, ODD, ADHD, and schizophrenia. Patient presents to the emergency department for suicidal thoughts and command hallucinations. Patient states that he sees a boy names [name] and a girl named Field seismologist. He has seen him since he was 23 years old. Patient states that [name] told him to kill himself today because "it would make it easier". Patient states that he was in the emergency department earlier, but discharged himself because [name] told him to do this. Patient states that he cut his left forearm with a knife, then a paperclip, then his brother's hunting knife. He states that his hallucinations have told him to kill himself before and that he attempted to hang himself once as a result. Patient states he has been noncompliant with his psychiatric medications for the last 2 months. His last behavioral health hospitalization was 3 months ago, per patient. Patient is followed by [facility] for psychiatric management. He states he is noncompliant with his medications because when he is on them he sees [place] and [name] in his dreams and they hurt him there.  The history is provided by the patient. No language interpreter was used.    Past Medical History  Diagnosis Date  . Asthma   . Bipolar 1 disorder   . ODD (oppositional defiant disorder)   . ADHD (attention deficit hyperactivity disorder)   . Schizophrenia    History reviewed. No pertinent past surgical history. History reviewed. No pertinent family history. History  Substance Use Topics  . Smoking status: Never Smoker   . Smokeless tobacco: Not on file  . Alcohol Use: Yes    Review of Systems   Psychiatric/Behavioral: Positive for suicidal ideas and hallucinations.  All other systems reviewed and are negative.     Allergies  Review of patient's allergies indicates no known allergies.  Home Medications   Prior to Admission medications   Medication Sig Start Date End Date Taking? Authorizing Provider  ARIPiprazole (ABILIFY) 15 MG tablet Take 1 tablet (15 mg total) by mouth daily. 08/03/13  Yes Lyanne Co, MD   BP 132/76  Pulse 91  Temp(Src) 98.3 F (36.8 C) (Oral)  Resp 17  SpO2 98%  Physical Exam  Nursing note and vitals reviewed. Constitutional: He is oriented to person, place, and time. He appears well-developed and well-nourished. No distress.  HENT:  Head: Normocephalic and atraumatic.  Eyes: Conjunctivae and EOM are normal. No scleral icterus.  Neck: Normal range of motion.  Cardiovascular: Normal rate, regular rhythm and normal heart sounds.   Pulmonary/Chest: Effort normal and breath sounds normal. No respiratory distress. He has no wheezes. He has no rales.  Musculoskeletal: Normal range of motion.  Neurological: He is alert and oriented to person, place, and time.  Skin: Skin is warm and dry. No rash noted. He is not diaphoretic. No erythema. No pallor.  Linear abrasion to volar aspect of L forearm.  Psychiatric: He has a normal mood and affect. His speech is normal. He is actively hallucinating. He expresses suicidal ideation. He expresses no homicidal ideation. He expresses no suicidal plans and no homicidal plans.    ED Course  Procedures (including critical care time) Labs Review Labs Reviewed  COMPREHENSIVE METABOLIC PANEL - Abnormal; Notable for the following:    Total Protein 8.5 (*)    All other components within normal limits  SALICYLATE LEVEL - Abnormal; Notable for the following:    Salicylate Lvl <2.0 (*)    All other components within normal limits  ACETAMINOPHEN LEVEL  CBC  ETHANOL  URINE RAPID DRUG SCREEN (HOSP PERFORMED)     Imaging Review No results found.   EKG Interpretation None      MDM   Final diagnoses:  History of command hallucinations  Self-inflicted injury  Abrasion of forearm, left    Patient presents to the emergency department for suicidal ideations secondary to command hallucinations. Patient noted to have linear abrasion to his volar left forearm secondary to attempting to slit his wrists. Endorses medication compliance. Patient medically cleared and TTS pending. IVC papers have been completed.   Filed Vitals:   08/03/13 2229  BP: 132/76  Pulse: 91  Temp: 98.3 F (36.8 C)  TempSrc: Oral  Resp: 17  SpO2: 98%       Antony MaduraKelly Ewart Carrera, PA-C 08/04/13 [phone]

## 2013-08-03 NOTE — ED Notes (Signed)
Pt agitated and upset because another patient woke him up. Sitter informed this RN that pt took the tinfoil off of his applesauce and tried to use it to cut his wrist, RN at [facility] immediately. Tinfoil and all food items on pt's bedside table removed. Charge RN informed of incident. No pt harm occurred.

## 2013-08-03 NOTE — ED Notes (Signed)
Patient walked to nursing station to ask "who woke him up" so he can "neurtalize the situation".  Informed the patient that he is safe, just as the staff are safe, while here.  Patient ambulated back to room.

## 2013-08-03 NOTE — ED Provider Notes (Signed)
The patient has become acting out this morning.  He was woke by another patient in pod C. who is being loud.  Patient voices staff that he wanted to kill the other patients, that was trying to cut his wrists with applesauce aluminum top.  Patient reports voices are telling him to bite himself, and cause himself to bleed.  Patient states his hand and broke the skin, but no active bleeding noted.  Nursing staff has washed the area we'll place a Band-Aid.  Patient is now sitting in line site at the nursing station.  He has received Ativan.  I have put in a consult to psychiatry for help with medication to help stabilize his mood.  If he continues to escalate, may need Geodon, but at this time it appears to be more behavioral than psychosis.  [name], MD 08/03/13 [phone]

## 2013-08-03 NOTE — ED Notes (Signed)
Spoke with Irving Burton at [facility] and informed her that pt. Will be discharged home per Dr. Patria Mane

## 2013-08-03 NOTE — Progress Notes (Signed)
  CARE MANAGEMENT ED NOTE 08/03/2013  Patient:  [name], [name]   Account Number:  [id number]  Date Initiated:  08/03/2013  Documentation initiated by:  Ferdinand Cava  Subjective/Objective Assessment:   23 yo male presenting to the ED with hallucinations, SI, and HI     Subjective/Objective Assessment Detail:   Patient is doing much better at this time.  He states he was having hallucinations because he was off of his Abilify.  He states that his been back on his Abilify is doing much better.  No longer having hallucinations.  He denies homicidal or suicidal thoughts.  He states he bit his hand today because he is upset and he wanted to go home and no one would listen to him.  He now is calm and compliant.  He is very pleasant to talk with.  He has good insight into his illness.  He follows that [facility] and has a Therapist, sports and psychologist there.  He states his mom is involved.     Action/Plan:   Patient will follow up with [facility] for treatment and medication management   Action/Plan Detail:   Anticipated DC Date:       Status Recommendation to Physician:   Result of Recommendation:      DC Planning Services  Other    Choice offered to / List presented to:            Status of service:  Completed, signed off  ED Comments:   ED Comments Detail:  CM spoke with patient regarding ED visit. Discussed no documented health insurance and discussed affordability of the discharge medication Abilify. Patient stated that he goes through [facility] to obtain his Abilify and he has not had a problem with affordability since he has been with [facility]. Patient had no other questions or concerns.

## 2013-08-03 NOTE — ED Notes (Signed)
Patient request to go back to room.  Discussed that at this time, he will sit at the nursing station.

## 2013-08-03 NOTE — ED Notes (Signed)
Patient sitting in chair, looking around himself. Not wanting to face staff.

## 2013-08-03 NOTE — ED Notes (Signed)
Dr. Campos in to see pt.  

## 2013-08-03 NOTE — ED Notes (Signed)
Pt informed this RN that [name] and Luther Parody are telling him to hurt himself, pt states Luther Parody is telling him to bite himself until he bleeds. Pt states [name] states that this RN is holding him back from being able to hurt himself. Pt states he will not hurt this RN. However pt reports he will hurt himself right now by biting himself. MD informed. See new orders.

## 2013-08-04 ENCOUNTER — Encounter (HOSPITAL_COMMUNITY): Payer: Self-pay | Admitting: Behavioral Health

## 2013-08-04 ENCOUNTER — Inpatient Hospital Stay (HOSPITAL_COMMUNITY)
Admission: AD | Admit: 2013-08-04 | Discharge: 2013-08-13 | DRG: 885 | Disposition: A | Discharge status: Home / Self Care | Payer: Federal, State, Local not specified - Other | Attending: Psychiatry | Admitting: Psychiatry

## 2013-08-04 DIAGNOSIS — F913 Oppositional defiant disorder: Secondary | ICD-10-CM | POA: Diagnosis present

## 2013-08-04 DIAGNOSIS — R45851 Suicidal ideations: Secondary | ICD-10-CM

## 2013-08-04 DIAGNOSIS — F259 Schizoaffective disorder, unspecified: Principal | ICD-10-CM | POA: Diagnosis present

## 2013-08-04 DIAGNOSIS — J45909 Unspecified asthma, uncomplicated: Secondary | ICD-10-CM | POA: Diagnosis present

## 2013-08-04 DIAGNOSIS — G4700 Insomnia, unspecified: Secondary | ICD-10-CM | POA: Diagnosis present

## 2013-08-04 DIAGNOSIS — F411 Generalized anxiety disorder: Secondary | ICD-10-CM | POA: Diagnosis present

## 2013-08-04 DIAGNOSIS — F319 Bipolar disorder, unspecified: Secondary | ICD-10-CM | POA: Diagnosis present

## 2013-08-04 DIAGNOSIS — F909 Attention-deficit hyperactivity disorder, unspecified type: Secondary | ICD-10-CM | POA: Diagnosis present

## 2013-08-04 LAB — RAPID URINE DRUG SCREEN, HOSP PERFORMED
Amphetamines: NOT DETECTED
Barbiturates: NOT DETECTED
Benzodiazepines: NOT DETECTED
Cocaine: NOT DETECTED
Opiates: NOT DETECTED
Tetrahydrocannabinol: NOT DETECTED

## 2013-08-04 MED ORDER — POLYVINYL ALCOHOL 1.4 % OP SOLN
1.0000 [drp] | OPHTHALMIC | Status: DC | PRN
  Filled 2013-08-04: qty 15

## 2013-08-04 MED ORDER — ARIPIPRAZOLE 15 MG PO TABS
15.0000 mg | ORAL_TABLET | Freq: Every day | ORAL | Status: DC
  Administered 2013-08-05: 15 mg via ORAL
  Filled 2013-08-04 (×3): qty 1

## 2013-08-04 MED ORDER — ARIPIPRAZOLE 15 MG PO TABS
15.0000 mg | ORAL_TABLET | Freq: Every day | ORAL | Status: DC
  Administered 2013-08-04: 15 mg via ORAL
  Filled 2013-08-04: qty 1

## 2013-08-04 MED ORDER — OLANZAPINE 10 MG PO TBDP
10.0000 mg | ORAL_TABLET | Freq: Three times a day (TID) | ORAL | Status: DC | PRN
  Administered 2013-08-04 – 2013-08-12 (×9): 10 mg via ORAL
  Filled 2013-08-04 (×9): qty 1

## 2013-08-04 MED ORDER — BACITRACIN-NEOMYCIN-POLYMYXIN OINTMENT TUBE
TOPICAL_OINTMENT | Freq: Every day | CUTANEOUS | Status: DC
  Administered 2013-08-04: 1 via TOPICAL
  Administered 2013-08-05 – 2013-08-08 (×4): 15 via TOPICAL
  Administered 2013-08-09: 1 via TOPICAL
  Administered 2013-08-10: 15 via TOPICAL
  Filled 2013-08-04: qty 15
  Filled 2013-08-04: qty 1

## 2013-08-04 MED ORDER — ZOLPIDEM TARTRATE 5 MG PO TABS
5.0000 mg | ORAL_TABLET | Freq: Every evening | ORAL | Status: DC | PRN
Start: 2013-08-03 — End: 2013-08-04

## 2013-08-04 MED ORDER — ALUM & MAG HYDROXIDE-SIMETH 200-200-20 MG/5ML PO SUSP: 30.0000 mL | ORAL | Status: DC | PRN

## 2013-08-04 MED ORDER — ACETAMINOPHEN 325 MG PO TABS
650.0000 mg | ORAL_TABLET | Freq: Four times a day (QID) | ORAL | Status: DC | PRN
  Administered 2013-08-10 – 2013-08-11 (×2): 650 mg via ORAL
  Filled 2013-08-04 (×2): qty 2

## 2013-08-04 MED ORDER — TRAZODONE HCL 50 MG PO TABS
50.0000 mg | ORAL_TABLET | Freq: Every evening | ORAL | Status: DC | PRN
  Filled 2013-08-04: qty 1

## 2013-08-04 MED ORDER — ALUM & MAG HYDROXIDE-SIMETH 200-200-20 MG/5ML PO SUSP
30.0000 mL | ORAL | Status: DC | PRN
  Administered 2013-08-05: 30 mL via ORAL

## 2013-08-04 MED ORDER — MAGNESIUM HYDROXIDE 400 MG/5ML PO SUSP
30.0000 mL | Freq: Every day | ORAL | Status: DC | PRN
  Administered 2013-08-12: 30 mL via ORAL

## 2013-08-04 MED ORDER — POLYVINYL ALCOHOL 1.4 % OP SOLN
1.0000 [drp] | OPHTHALMIC | Status: DC | PRN
  Administered 2013-08-04 – 2013-08-07 (×4): 1 [drp] via OPHTHALMIC
  Filled 2013-08-04: qty 15

## 2013-08-04 MED ORDER — NICOTINE 21 MG/24HR TD PT24
21.0000 mg | MEDICATED_PATCH | Freq: Every day | TRANSDERMAL | Status: DC
  Filled 2013-08-04: qty 1

## 2013-08-04 MED ORDER — ONDANSETRON HCL 4 MG PO TABS: 4.0000 mg | ORAL_TABLET | Freq: Three times a day (TID) | ORAL | Status: DC | PRN

## 2013-08-04 NOTE — ED Provider Notes (Signed)
Medical screening examination/treatment/procedure(s) were performed by non-physician practitioner and as supervising physician I was immediately available for consultation/collaboration.   EKG Interpretation None       [name], MD 08/04/13 [phone]

## 2013-08-04 NOTE — Progress Notes (Signed)
Adult Psychoeducational Group Note  Date:  08/04/2013 Time:  9:51 PM  Group Topic/Focus:  Wrap-Up Group:   The focus of this group is to help patients review their daily goal of treatment and discuss progress on daily workbooks.  Participation Level:  Minimal  Participation Quality:  Sharing  Affect:  Flat  Cognitive:  Appropriate  Insight: Limited  Engagement in Group:  Limited  Modes of Intervention:  Socialization and Support  Additional Comments:  Patient attended and participated in group tonight. He reports having an OK day. He was in the hospital before coming to [facility] at 2pm. He went for dinner this evening and has just been observing   Zoya Sprecher Fabiola Backer 08/04/2013, 9:51 PM

## 2013-08-04 NOTE — Tx Team (Signed)
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  The patient and family have been given the opportunity to ask questions and make suggestions.  [name] L Aaronjames Kelsay 08/04/2013, 4:21 PM

## 2013-08-04 NOTE — ED Notes (Signed)
PATIENT IS AGREEABLE TO HAVING A ROOM MATE AT [facility].

## 2013-08-04 NOTE — Progress Notes (Signed)
D: Patient in the hallway on approach.  Patient states he had an ok day.  Patient appears flat and suspicious.  Patient states he hears voices that tell him to hurt others.  Patient would not disclose who the voices were telling him to hurt.  Patint states he has passive SI but verbally contracts for safety.  Patient visible on the unit.  Patient calm and cooperative with Clinical research associate during assessment.  Patient states he is unsure if he will take his prescribed medication. A: Staff to monitor Q 15 mins for safety.  Encouragement and support offered. No scheduled medications administered per orders. R: Patient remains safe on the unit.  Patient attended group tonight.  Patient visible on the unit.
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
## 2013-08-05 DIAGNOSIS — R45851 Suicidal ideations: Secondary | ICD-10-CM

## 2013-08-05 MED ORDER — IBUPROFEN 200 MG PO TABS
800.0000 mg | ORAL_TABLET | Freq: Three times a day (TID) | ORAL | Status: DC | PRN
  Administered 2013-08-05 – 2013-08-12 (×5): 800 mg via ORAL
  Filled 2013-08-05 (×6): qty 4

## 2013-08-05 MED ORDER — OLANZAPINE 10 MG PO TBDP
10.0000 mg | ORAL_TABLET | Freq: Every day | ORAL | Status: DC
  Administered 2013-08-05: 10 mg via ORAL
  Filled 2013-08-05 (×2): qty 1

## 2013-08-05 MED ORDER — DIPHENHYDRAMINE HCL 50 MG/ML IJ SOLN
50.0000 mg | INTRAMUSCULAR | Status: AC
  Filled 2013-08-05: qty 1

## 2013-08-05 MED ORDER — TRAZODONE HCL 100 MG PO TABS
100.0000 mg | ORAL_TABLET | Freq: Every day | ORAL | Status: DC
  Administered 2013-08-07: 100 mg via ORAL
  Filled 2013-08-05 (×4): qty 1

## 2013-08-05 MED ORDER — DIPHENHYDRAMINE HCL 50 MG/ML IJ SOLN
INTRAMUSCULAR | Status: AC
  Filled 2013-08-05: qty 1

## 2013-08-05 MED ORDER — LORAZEPAM 2 MG/ML IJ SOLN
INTRAMUSCULAR | Status: AC
  Filled 2013-08-05: qty 1

## 2013-08-05 MED ORDER — LORAZEPAM 2 MG/ML IJ SOLN: 2.0000 mg | Freq: Once | INTRAMUSCULAR | Status: DC

## 2013-08-05 NOTE — [facility] Suicide Risk Assessment (Signed)
   Nursing information obtained from:  Patient Demographic factors:  Male;Adolescent or young adult;Low socioeconomic status;Unemployed Current Mental Status:  Suicidal ideation indicated by patient;Self-harm thoughts;Self-harm behaviors Loss Factors:  Financial problems / change in socioeconomic status Historical Factors:  Prior suicide attempts Risk Reduction Factors:  Sense of responsibility to family;Positive social support Total Time spent with patient: 30 minutes  CLINICAL FACTORS:   Depression:   Anhedonia Comorbid alcohol abuse/dependence Delusional Hopelessness Impulsivity Insomnia Schizophrenia:   Command hallucinatons Less than 78 years old Paranoid or undifferentiated type Currently Psychotic  Psychiatric Specialty Exam: Physical Exam  Psychiatric: His speech is rapid and/or pressured. He is hyperactive and actively hallucinating. Thought content is paranoid and delusional. Cognition and memory are normal. He expresses impulsivity. He exhibits a depressed mood. He expresses suicidal ideation. He expresses suicidal plans.    Review of Systems  Constitutional: Negative.   HENT: Negative.   Eyes: Negative.   Respiratory: Negative.   Cardiovascular: Negative.   Gastrointestinal: Negative.   Genitourinary: Negative.   Musculoskeletal: Negative.   Skin: Negative.   Neurological: Negative.   Endo/Heme/Allergies: Negative.   Psychiatric/Behavioral: Positive for suicidal ideas and hallucinations. The patient is nervous/anxious and has insomnia.     Blood pressure 114/81, pulse 69, temperature 97.6 F (36.4 C), temperature source Oral, resp. rate 20, height 5\' 11"  (1.803 m), weight 112.946 kg (249 lb).Body mass index is 34.74 kg/(m^2).  General Appearance: Fairly Groomed  Patent attorney::  Fair  Speech:  Pressured  Volume:  Increased  Mood:  Irritable  Affect:  Labile  Thought Process:  Circumstantial and Disorganized  Orientation:  Full (Time, Place, and Person)   Thought Content:  Delusions and Hallucinations: Auditory Visual  Suicidal Thoughts:  Yes.  without intent/plan  Homicidal Thoughts:  No  Memory:  Immediate;   Fair Recent;   Fair Remote;   Fair  Judgement:  Impaired  Insight:  Shallow  Psychomotor Activity:  Increased  Concentration:  Fair  Recall:  [employer] of Knowledge:Fair  Language: Good  Akathisia:  No  Handed:  Right  AIMS (if indicated):     Assets:  Communication Skills Desire for Improvement Physical Health  Sleep:  Number of Hours: 6.25   Musculoskeletal: Strength & Muscle Tone: within normal limits Gait & Station: normal Patient leans: N/A  COGNITIVE FEATURES THAT CONTRIBUTE TO RISK:  Closed-mindedness Polarized thinking    SUICIDE RISK:   Moderate:  Frequent suicidal ideation with limited intensity, and duration, some specificity in terms of plans, no associated intent, good self-control, limited dysphoria/symptomatology, some risk factors present, and identifiable protective factors, including available and accessible social support.  PLAN OF CARE:1. Admit for crisis management and stabilization. 2. Medication management to reduce current symptoms to base line and improve the     patient's overall level of functioning 3. Treat health problems as indicated. 4. Develop treatment plan to decrease risk of relapse upon discharge and the need for     readmission. 5. Psycho-social education regarding relapse prevention and self care. 6. Health care follow up as needed for medical problems. 7. Restart home medications where appropriate.   I certify that inpatient services furnished can reasonably be expected to improve the patient's condition.  Bernadene Garside,MD 08/05/2013, 9:27 AM
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
## 2013-08-05 NOTE — BHH Group Notes (Signed)
[facility] [employer] Group Therapy  08/05/2013 , 1:32 PM    Type of Therapy:  M[facility] [employer] Presentation  Participation Level:  Active  Participation Quality:  Attentive  Affect:  Blunted  Cognitive:  Oriented  Insight:  Limited  Engagement in Therapy:  Engaged  Modes of Intervention:  Discussion, Education and Socialization  Summary of Progress/Problems:  Onalee Hua from [employer] came to present his recovery story and play the guitar.  Overall, attentive.  Got up and down multiple times to leave the room and return.    Daryel Gerald B 08/05/2013 , 1:32 PM

## 2013-08-05 NOTE — Tx Team (Signed)
  Interdisciplinary Treatment Plan Update   Date Reviewed:  08/05/2013  Time Reviewed:  8:15 AM  Progress in Treatment:   Attending groups: Yes Participating in groups: Yes Taking medication as prescribed: Yes  Tolerating medication: Yes Family/Significant other contact made: No  Patient understands diagnosis: Yes AEB asking for help getting back on meds for AH/VH Discussing patient identified problems/goals with staff: Yes See initial care plan Medical problems stabilized or resolved: Yes Denies suicidal/homicidal ideation: Yes  In tx team Patient has not harmed self or others: Yes  For review of initial/current patient goals, please see plan of care.  Estimated Length of Stay:  4-5 days  Reason for Continuation of Hospitalization: Depression Hallucinations Medication stabilization  New Problems/Goals identified:  N/A  Discharge Plan or Barriers:   return home with mother, follow up with [name]  Additional Comments:     [name] is an 23 y.o. male.  -Patient was seen by this clinician on 06/04. Patient had been discharged on 06/08 with the understanding that he was going to go to [place]. Patient said that he did not go to [place] on discharge but rather went home and made cuts to his left wrist. He said that he at first used a paper clip then used his brother's knife.  Pt says that he is afraid that "[name] & [name]" will continue to tell him to harm himself. Pt denies that he currently wants to kill himself but says that he does not feel safe by himself. Pt denies current HI.  Patient has been seeing & hearing people that are telling him to go to a school and blow it up and shoot people.  Patient reports that he told his mother that he was seeing people and they were talking to him.  Today they appeared and told him that he should go blow up a school and shoot people. They say they will stop bothering him if he does these things. He knows that it is wrong. Patient has cut  himself in the past after hallucinations have told him to do so. Last incident of this happening was 5-6 months ago. Patient denies HI or SI at this time.  Patient says that he was set up with services at [facility] three months ago when the family moved from [place]. He has been in psychiatric facilities off and on for most of his life he says. Patient's last hospitalization was 3 months ago in [place]. Patient cannot remember the names of the psychiatrist and therapist at [facility]. Patient has not taken medications in two months and has a poor memory of dosages and names of meds.  Today states that when he takes medication, the voices disappear, but admits to non-compliance with medication.  Says he and mother moved her several months ago "for religious reasons."   Attendees:  Signature: Thedore Mins, MD 08/05/2013 8:15 AM   Signature: Richelle Ito, LCSW 08/05/2013 8:15 AM  Signature: Fransisca Kaufmann, NP 08/05/2013 8:15 AM  Signature: Joslyn Devon, RN 08/05/2013 8:15 AM  Signature: Liborio Nixon, RN 08/05/2013 8:15 AM  Signature:  08/05/2013 8:15 AM  Signature:   08/05/2013 8:15 AM  Signature:    Signature:    Signature:    Signature:    Signature:    Signature:      Scribe for Treatment Team:   Richelle Ito, LCSW  08/05/2013 8:15 AM

## 2013-08-05 NOTE — Progress Notes (Signed)
NUTRITION ASSESSMENT  Pt identified as at risk on the Malnutrition Screen Tool  INTERVENTION: 1. Educated patient on the importance of nutrition and encouraged intake of food and beverages. 2. Discussed weight goals. 3. Supplements: none at this time.  NUTRITION DIAGNOSIS: Obesity related to excessive caloric intake related to needs AEB BMI>34.  Goal: Pt to meet >/= 90% of their estimated nutrition needs.  Monitor:  PO intake  Assessment: Patient admitted with hx that includes.   bipolar, oppositional defiant disorder, ADHD, schizophrenia.  Admitted for SI and cutting.   Patient reports fair intake currently and prior to admit.  Weight per epic with noted gain over the past month of 29 lbs.  23 y.o. male  Height: Ht Readings from Last 1 Encounters:  08/04/13 5\' 11"  (1.803 m)    Weight: Wt Readings from Last 1 Encounters:  08/04/13 249 lb (112.946 kg)    Weight Hx: Wt Readings from Last 10 Encounters:  08/04/13 249 lb (112.946 kg)  07/30/13 220 lb (99.791 kg)  07/01/13 220 lb (99.791 kg)    BMI:  Body mass index is 34.74 kg/(m^2). Pt meets criteria for obesity grade 1 based on current BMI.  Estimated Nutritional Needs: Kcal: 25-30 kcal/kg Protein: > 1 gram protein/kg Fluid: 1 ml/kcal  Diet Order: General Pt is also offered choice of unit snacks mid-morning and mid-afternoon.  Pt is eating as desired.   Lab results and medications reviewed.   Oran Rein, RD, LDN Clinical Inpatient Dietitian Pager:  [phone] Weekend and after hours pager:  [phone]

## 2013-08-05 NOTE — Progress Notes (Signed)
Patient ID: [name], male   DOB: 1990-03-21, 21 y.o.   MRN: [id number] D: Pt is awake and active on the unit this AM. Pt endorses AVH to include seeing people who tell him to hurt himself and others. Pt does contract for safety and states that he no longer acts on the hallucinations, although he used to. Pt mood is appropriate and his affect is blunted. However, pt did relate to staff and was open to discussing sports and conversation about TV programming in general. Pt is pleasant and cooperative with staff.   A: Encouraged pt to express needs with staff and administered medication per MD orders. Writer also encouraged pt to participate in groups.  R: Writer will continue to monitor. 15 minute checks are ongoing for safety.
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
## 2013-08-06 MED ORDER — OLANZAPINE 5 MG PO TBDP
15.0000 mg | ORAL_TABLET | Freq: Every day | ORAL | Status: DC
  Administered 2013-08-06: 15 mg via ORAL
  Filled 2013-08-06 (×2): qty 1

## 2013-08-06 MED ORDER — PANTOPRAZOLE SODIUM 40 MG PO TBEC
DELAYED_RELEASE_TABLET | ORAL | Status: AC
  Filled 2013-08-06: qty 1

## 2013-08-06 MED ORDER — PANTOPRAZOLE SODIUM 40 MG PO TBEC
40.0000 mg | DELAYED_RELEASE_TABLET | Freq: Every day | ORAL | Status: DC
  Administered 2013-08-06: 40 mg via ORAL
  Filled 2013-08-06 (×2): qty 1

## 2013-08-06 NOTE — Progress Notes (Signed)
Adult Psychoeducational Group Note  Date:  08/06/2013 Time:  12:34 AM  Group Topic/Focus:  Wrap-Up Group:   The focus of this group is to help patients review their daily goal of treatment and discuss progress on daily workbooks.  Participation Level:  Did Not Attend  Participation Quality:  Did not attend  Affect:  Did not attend  Cognitive:  Did not attend  Insight: None  Engagement in Group:  Did not attend  Modes of Intervention:  Socialization and Support  Additional Comments:  Patient did not attend group  Scot Dock 08/06/2013, 12:34 AM

## 2013-08-06 NOTE — [facility] Suicide Risk Assessment (Signed)
[facility] INPATIENT:  Family/Significant Other Suicide Prevention Education  Suicide Prevention Education:  Education Completed; [name], mother, [phone] has been identified by the patient as the family member/significant other with whom the patient will be residing, and identified as the person(s) who will aid the patient in the event of a mental health crisis (suicidal ideations/suicide attempt).  With written consent from the patient, the family member/significant other has been provided the following suicide prevention education, prior to the and/or following the discharge of the patient.  The suicide prevention education provided includes the following:  Suicide risk factors  Suicide prevention and interventions  National Suicide Hotline telephone number  [facility] assessment telephone number  [facility] Emergency Assistance 911  [facility] and/or Residential Mobile Crisis Unit telephone number  Request made of family/significant other to:  Remove weapons (e.g., guns, rifles, knives), all items previously/currently identified as safety concern.    Remove drugs/medications (over-the-counter, prescriptions, illicit drugs), all items previously/currently identified as a safety concern.  The family member/significant other verbalizes understanding of the suicide prevention education information provided.  The family member/significant other agrees to remove the items of safety concern listed above.  [name] 08/06/2013, 3:38 PM

## 2013-08-06 NOTE — [facility] Group Notes (Signed)
Adult Psychoeducational Group Note  Date:  08/06/2013 Time: 11am Group Topic/Focus:  Leisure and lifestyle changes.  Participation Level:  Active  Participation Quality:  Appropriate  Affect:  Appropriate  Cognitive:  Appropriate  Insight: Good  Engagement in Group:  Engaged  Modes of Intervention:  Support  Additional Comments:    Bethel Born 08/06/2013, 4:13 PM

## 2013-08-06 NOTE — Progress Notes (Signed)
D: Patient denies SI/HI and states that the voices are starting to quiet.  Pt. Is somewhat guarded and depressed appearing.  He states that he is sleeping well and that his appetite is good.  Pt. Attended morning group, participated and showed good insight into the topic of leisure and lifestyle changes.  Pt. Has shown good self control on the unit today, has been up in the dayroom and interacting with others appropriately within the milieu.  A: Patient given emotional support from RN. Patient encouraged to come to staff with concerns and/or questions. Patient's medication routine continued. Patient's orders and plan of care reviewed.   R: Patient remains appropriate and cooperative. Will continue to monitor patient q15 minutes for safety.
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
## 2013-08-06 NOTE — Progress Notes (Signed)
D   Pt paces the hall he is hyperverbal and agitated   He continues to say I can do what I want to to my own body   He did allow staff to put bandages on his arms   He is unpredictable and cannot contract for safety A   Verbal support given   Medications administered and effectiveness monitored   1:1 sitter R   Pt safe at present

## 2013-08-06 NOTE — [facility] Group Notes (Signed)
[facility] Group Notes:  (Counselor/Nursing/MHT/Case Management/Adjunct)  08/06/2013 1:15PM  Type of Therapy:  Group Therapy  Participation Level:  Active  Participation Quality:  Appropriate  Affect:  Flat  Cognitive:  Oriented  Insight:  Improving  Engagement in Group:  Limited  Engagement in Therapy:  Limited  Modes of Intervention:  Discussion, Exploration and Socialization  Summary of Progress/Problems: The topic for group was balance in life.  Pt participated in the discussion about when their life was in balance and out of balance and how this feels.  Pt discussed ways to get back in balance and short term goals they can work on to get where they want to be.  [name] vacillated about whether he is balanced or not.  On one hand, he thinks of himself as "OK" and doing all right.  On the other hand, he acknowledged that he gets angry easily, and will strike out at others particularly if they invade his space or touch him.  When the subject of community came up, he talked about the importance of respecting each other's boundaries.He also talked about the importance of taking personal responsibility, and apologizing.   [name] 08/06/2013 2:37 PM

## 2013-08-07 LAB — URINALYSIS, ROUTINE W REFLEX MICROSCOPIC
BILIRUBIN URINE: NEGATIVE
Glucose, UA: NEGATIVE mg/dL
Hgb urine dipstick: NEGATIVE
Ketones, ur: NEGATIVE mg/dL
LEUKOCYTES UA: NEGATIVE
NITRITE: NEGATIVE
PH: 5.5 (ref 5.0–8.0)
Protein, ur: NEGATIVE mg/dL
Specific Gravity, Urine: 1.016 (ref 1.005–1.030)
UROBILINOGEN UA: 1 mg/dL (ref 0.0–1.0)

## 2013-08-07 MED ORDER — DIPHENHYDRAMINE HCL 50 MG PO CAPS
50.0000 mg | ORAL_CAPSULE | Freq: Once | ORAL | Status: AC
Start: 2013-08-07 — End: 2013-08-07
  Administered 2013-08-07: 50 mg via ORAL
  Filled 2013-08-07: qty 1
  Filled 2013-08-07: qty 2

## 2013-08-07 MED ORDER — PANTOPRAZOLE SODIUM 40 MG PO TBEC
40.0000 mg | DELAYED_RELEASE_TABLET | Freq: Every day | ORAL | Status: DC
Start: 2013-08-08 — End: 2013-08-13
  Administered 2013-08-08 – 2013-08-12 (×5): 40 mg via ORAL
  Filled 2013-08-07 (×6): qty 1

## 2013-08-07 MED ORDER — LORAZEPAM 1 MG PO TABS
1.0000 mg | ORAL_TABLET | Freq: Four times a day (QID) | ORAL | Status: DC | PRN
  Administered 2013-08-07 – 2013-08-10 (×4): 1 mg via ORAL
  Filled 2013-08-07 (×4): qty 1

## 2013-08-07 MED ORDER — OLANZAPINE 10 MG PO TBDP
10.0000 mg | ORAL_TABLET | Freq: Once | ORAL | Status: AC
  Administered 2013-08-07: 10 mg via ORAL
  Filled 2013-08-07: qty 1

## 2013-08-07 MED ORDER — OLANZAPINE 10 MG PO TBDP
20.0000 mg | ORAL_TABLET | Freq: Every day | ORAL | Status: DC
  Administered 2013-08-07: 20 mg via ORAL
  Filled 2013-08-07 (×3): qty 2

## 2013-08-07 MED ORDER — CARBAMAZEPINE ER 200 MG PO TB12
200.0000 mg | ORAL_TABLET | Freq: Two times a day (BID) | ORAL | Status: DC
  Administered 2013-08-07 – 2013-08-13 (×13): 200 mg via ORAL
  Filled 2013-08-07 (×6): qty 1
  Filled 2013-08-07: qty 28
  Filled 2013-08-07: qty 1
  Filled 2013-08-07: qty 28
  Filled 2013-08-07 (×9): qty 1

## 2013-08-07 NOTE — Progress Notes (Signed)
Patient ID: [name], male   DOB: 04/07/1990, 23 y.o.   MRN: [id number] 1-1 Monitoring Note. D. Patient continues to require frequent redirection from staff. [name] continues to be easily agitated and have various somatic complaints. A. 1.1. Monitoring in place for pt safety. R. Patient is safe. Will continue to monitor 1.1. As ordered.    
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
## 2013-08-07 NOTE — Progress Notes (Signed)
Patient ID: [name], male   DOB: 10/31/1990, 23 y.o.   MRN: [id number] 1-1 Monitoring Note. D. Patient presents with irritable mood, affect labile. [name] has been very intrusive, irritable and somatic this morning. He states to Clinical research associatewriter, '' I'm not taking that stomach medication, it's not working. I want to talk to the doctor. What medication is it? What is the Zyprexa for? I need eye drops. You need to put them in my eye for me. Let me sit down. '' He was later noted to pace up the hallway, and then when asking to use the phone became irritable when staff at arms length as ordered. He states '' This is fucking ridiculous, I want to use the phone in private, I don't want a 1.1. '' Patient with poor insight into prior behaviors, and mood remains labile. A. 1.1. Monitored due to patients continued labile mood and unpredictable behaviors. Medications given as ordered, including prn medications for agitation. Discussed above information with Dr. Akintayo/Treatment team. R. Patient continues to require frequent redirection from staff. Will continue to monitor 1/1 as ordered.

## 2013-08-07 NOTE — Progress Notes (Addendum)
Patient ID: [name], male   DOB: April 03, 1990, 23 y.o.   MRN: [id number] 1:1 nursing Note: The patient was very resistant to taking Benadryl for sleep. Stated that he did not want to sleep, that he preferred to remain awake. When he took the medication, it appeared that he cheeked the medication. However, when his mouth was examined the pills were not detected. He was standing next to his bed and the tech asked him to please get into bed so that he does not fall. He then deliberately dropped to one knee. He smiles inappropriately when doing these behaviors. He was examined for potential injury. No injury noted. Vital signs stable with no noted orthostatic changes. Fall risk plan reviewed with patient. Gretta ArabKenisha Herbin, A.C. and Alberteen SamFran Hobson NP notified and felt that this was behavioral and not a true fall.1:1 maintained for safety.

## 2013-08-07 NOTE — Progress Notes (Signed)
Patient ID: [name], male   DOB: 03-15-1990, 23 y.o.   MRN: [id number] 1-1 Monitoring Note. D. Pt continues to be very intrusive, irritable and labile with somatic complaints. He continues to require 1.1 staff for redirection and unpredictable behaviors. Pt has been continuously speaking with staff today due to various somatic complaints. He states ''My stomach hurts, I might have diarrhea, I need to go see a real doctor. Who is my doctors boss? I want to see him. He told me to take a pill for it but he doesn't know my body. '' Patient was redirected and educated by Clinical research associatewriter and MD as he had refused his protonix earlier. He continues to show poor insight. Later he states '' oh that's fine now my stomach has resolved. When can I go home? I want a date to go . '' Patient later back to medication window requesting medication for stomach ache, when he was offered prn maalox he refused. He states ''that doesn't work for me. Don't you have some kind of pill. '' He was offered ginger ale and reminded that his protonix is scheduled at hs per his request. He stomped down hallway back into group. A. 1.1. Monitored due to patients continued labile mood and unpredictable behaviors, and for patients safety. R. Patient continues to require frequent redirection from staff. Will continue to monitor 1/1 as ordered.

## 2013-08-07 NOTE — Progress Notes (Signed)
[facility] Group Notes:  (Nursing/MHT/Case Management/Adjunct)  Date:  08/07/2013  Time:  10:01 PM  Type of Therapy:  Psychoeducational Skills  Participation Level:  Active  Participation Quality:  Attentive  Affect:  Labile  Cognitive:  Appropriate  Insight:  Improving  Engagement in Group:  Developing/Improving  Modes of Intervention:  Education  Summary of Progress/Problems: The patient described his day as having gone "fairly well". He talked about spending his day thinking about some of his past history. He came out and stated that he was still feeling agitated, but could not identify its source. In terms of the theme for the day, his relapse prevention will include taking his medication, listening to music, and trying to be less angry.   [name], [name] S 08/07/2013, 10:01 PM

## 2013-08-07 NOTE — Progress Notes (Signed)
08/07/13 2331  Pain Assessment  Pain Assessment No/denies pain  Pain Score 0  Neurological  Neuro (WDL) WDL  Musculoskeletal  Musculoskeletal (WDL) WDL  Integumentary  Integumentary (WDL) WDL
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
## 2013-08-07 NOTE — Progress Notes (Signed)
Patient ID: [name], male   DOB: [birth year], 23 y.o.   MRN: [id number] [facility]  08/07/2013 10:08 AM Larrell Laural BenesJohnson  MRN:  [id number] Subjective: " I scratched myself and punched the wall yesterday because I was upset. I think I have anger problem.''m hearing voices and it is getting very bad, the voices are telling me to harm people or myself.'' Objective: Patient is seen and his chart is reviewed with the members of the treatment team. Patient reports irritable mood, difficulty controlling his anger, racing thoughts and auditory/visual hallucinations. He states that he is still hearing voices of two people: [name] who is an Hispanic who tells him good stuffs and [name] who is a girl that tells him bad stuffs He is also endorsing recurrent suicidal thoughts but says he has no plan to end his own life. He is very somatic, evasive and demanding. Patient is very paranoid and always request to speak to the provider alone because he does not want other people to hear and use his words against him. Patient can get agitated very easily and his behavior could be unpredictable.  Diagnosis:   DSM5: Schizophrenia Disorders:  Delusional Disorder (297.1) and  Psychotic Disorder (298.8) Obsessive-Compulsive Disorders:   Trauma-Stressor Disorders:   Substance/Addictive Disorders:   Depressive Disorders:  Disruptive Mood Dysregulation Disorder (296.99) Total Time spent with patient: 30 minutes  Axis I: Schizoaffective disorder bipolar type  Axis II: Cluster B Traits Axis III:  Past Medical History  Diagnosis Date  . Asthma   . Bipolar 1 disorder   . ODD (oppositional defiant disorder)   . ADHD (attention deficit hyperactivity disorder)   . Schizophrenia    Axis IV: other psychosocial or environmental problems and problems related to social environment  ADL's:  Intact  Sleep: Fair  Appetite:  Fair  Suicidal Ideation: yes Plan:  denies Intent:  denies Means:  denies Homicidal  Ideation:   AEB (as evidenced by):  Psychiatric Specialty Exam: Physical Exam  Psychiatric: His mood appears anxious. His affect is labile. His speech is rapid and/or pressured. He is aggressive and actively hallucinating. Thought content is paranoid and delusional. Cognition and memory are normal. He expresses impulsivity. He exhibits a depressed mood. He expresses suicidal ideation.    Review of Systems  Constitutional: Negative.   HENT: Negative.   Eyes: Negative.   Respiratory: Negative.   Cardiovascular: Negative.   Gastrointestinal: Negative.   Genitourinary: Negative.   Musculoskeletal: Negative.   Skin: Negative.   Neurological: Negative.   Endo/Heme/Allergies: Negative.   Psychiatric/Behavioral: Positive for depression, suicidal ideas and hallucinations. The patient is nervous/anxious.     Blood pressure 112/72, pulse 57, temperature 97.8 F (36.6 C), temperature source Oral, resp. rate 16, height 5\' 11"  (1.803 m), weight 112.946 kg (249 lb), SpO2 100.00%.Body mass index is 34.74 kg/(m^2).  General Appearance: Casual  Eye Contact::  Good  Speech:  Pressured  Volume:  Increased  Mood:  Angry and Irritable  Affect:  Labile  Thought Process:  Circumstantial and Disorganized  Orientation:  Full (Time, Place, and Person)  Thought Content:  Delusions and Hallucinations: Auditory  Suicidal Thoughts:  Yes.  without intent/plan  Homicidal Thoughts:  No  Memory:  Immediate;   Fair Recent;   Fair Remote;   Fair  Judgement:  Impaired  Insight:  Shallow  Psychomotor Activity:  Increased  Concentration:  Fair  Recall:  [employer]  Fund of Knowledge:Fair  Language: Good  Akathisia:  No  Handed:  Right  AIMS (if indicated):     Assets:  Communication Skills Desire for Improvement Physical Health  Sleep:  Number of Hours: 5.5   Musculoskeletal: Strength & Muscle Tone: within normal limits Gait & Station: normal Patient leans: N/A  Current Medications: Current  Facility-Administered Medications  Medication Dose Route Frequency Provider Last Rate Last Dose  . acetaminophen (TYLENOL) tablet 650 mg  650 mg Oral Q6H PRN Fransisca Kaufmann, NP      . alum & mag hydroxide-simeth (MAALOX/MYLANTA) 200-200-20 MG/5ML suspension 30 mL  30 mL Oral Q4H PRN Fransisca Kaufmann, NP   30 mL at 08/05/13 0615  . carbamazepine (TEGRETOL XR) 12 hr tablet 200 mg  200 mg Oral BID Philomene Haff      . ibuprofen (ADVIL,MOTRIN) tablet 800 mg  800 mg Oral Q8H PRN Kerry Hough, PA-C   800 mg at 08/05/13 2019  . LORazepam (ATIVAN) injection 2 mg  2 mg Intramuscular Once Spencer E Simon, PA-C      . magnesium hydroxide (MILK OF MAGNESIA) suspension 30 mL  30 mL Oral Daily PRN Fransisca Kaufmann, NP      . neomycin-bacitracin-polymyxin (NEOSPORIN) ointment   Topical Daily Fransisca Kaufmann, NP   15 application at 08/07/13 0749  . OLANZapine zydis (ZYPREXA) disintegrating tablet 10 mg  10 mg Oral Q8H PRN Fransisca Kaufmann, NP   10 mg at 08/07/13 0746  . OLANZapine zydis (ZYPREXA) disintegrating tablet 10 mg  10 mg Oral Once Tascha Casares      . OLANZapine zydis (ZYPREXA) disintegrating tablet 20 mg  20 mg Oral QHS Shequila Neglia      . [START ON 08/08/2013] pantoprazole (PROTONIX) EC tablet 40 mg  40 mg Oral QHS Gaspare Netzel      . polyvinyl alcohol (LIQUIFILM TEARS) 1.4 % ophthalmic solution 1 drop  1 drop Both Eyes PRN Fransisca Kaufmann, NP   1 drop at 08/06/13 1034  . traZODone (DESYREL) tablet 100 mg  100 mg Oral QHS Hokulani Rogel        Lab Results:  Results for orders placed during the hospital encounter of 08/04/13 (from the past 48 hour(s))  URINALYSIS, ROUTINE W REFLEX MICROSCOPIC     Status: None   Collection Time    08/06/13 11:00 PM      Result Value Ref Range   Color, Urine YELLOW  YELLOW   APPearance CLEAR  CLEAR   Specific Gravity, Urine 1.016  1.005 - 1.030   pH 5.5  5.0 - 8.0   Glucose, UA NEGATIVE  NEGATIVE mg/dL   Hgb urine dipstick NEGATIVE  NEGATIVE   Bilirubin Urine NEGATIVE   NEGATIVE   Ketones, ur NEGATIVE  NEGATIVE mg/dL   Protein, ur NEGATIVE  NEGATIVE mg/dL   Urobilinogen, UA 1.0  0.0 - 1.0 mg/dL   Nitrite NEGATIVE  NEGATIVE   Leukocytes, UA NEGATIVE  NEGATIVE   Comment: MICROSCOPIC NOT DONE ON URINES WITH NEGATIVE PROTEIN, BLOOD, LEUKOCYTES, NITRITE, OR GLUCOSE <1000 mg/dL.     Performed at [facility]    Physical Findings: AIMS: Facial and Oral Movements Muscles of Facial Expression: None, normal Lips and Perioral Area: None, normal Jaw: None, normal Tongue: None, normal,Extremity Movements Upper (arms, wrists, hands, fingers): None, normal Lower (legs, knees, ankles, toes): None, normal,  , Overall Severity Severity of abnormal movements (highest score from questions above): None, normal Incapacitation due to abnormal movements: None, normal Patient's awareness of abnormal movements (rate only patient's report): No Awareness, Dental Status Current problems with  teeth and/or dentures?: No Does patient usually wear dentures?: No  CIWA:    COWS:     Treatment Plan Summary: Daily contact with patient to assess and evaluate symptoms and progress in treatment Medication management  Plan:1. Admit for crisis management and stabilization. 2. Medication management to reduce current symptoms to base line and improve the  patient's overall level of functioning: -Increase Zyprexa to 20mg  po Qhs for psychosis/delusions -Continue Zyprexa Zydis 10mg  po q8 as needed for Agitation/psychosis -Initiate Tegretol XR 200mg  bid for mood stabilization. 3. Treat health problems as indicated. 4. Develop treatment plan to decrease risk of relapse upon discharge and the need for readmission. 5. Psycho-social education regarding relapse prevention and self care. 6. Health care follow up as needed for medical problems. 7. Continue 1:1 observation for safety.    Medical Decision Making Problem Points:  Established problem, worsening (2), Review of  last therapy session (1) and Review of psycho-social stressors (1) Data Points:  Order Aims Assessment (2) Review or order clinical lab tests (1) Review of medication regiment & side effects (2)  I certify that inpatient services furnished can reasonably be expected to improve the patient's condition.   Thedore MinsAkintayo, Damier Disano, MD 08/07/2013, 10:08 AM

## 2013-08-07 NOTE — Progress Notes (Signed)
Pt has finally settled down to his bed and is resting with eyes closed   Prior to going to his room he continued to ruminate about his abdomen pain and said he has been complaining of it for days but there is no record of same   He became argumentative but eventually did redirect and decided to lay down   He is on a 1:1 for self harm behaviors   He is presently safe

## 2013-08-07 NOTE — [facility] Group Notes (Signed)
[facility] LCSW Group Therapy  08/07/2013  1:05 PM  Type of Therapy:  Group therapy  Participation Level:  Active  Participation Quality:  Attentive  Affect:  Flat  Cognitive:  Oriented  Insight:  Limited  Engagement in Therapy:  Limited  Modes of Intervention:  Discussion, Socialization  Summary of Progress/Problems:  Chaplain was here to lead a group on themes of hope and courage.  Courage is bravery-you know-stand up for yourelf."  'I remind myself that others are dependent on me. You know, family.  I usually don't tell them when I am discouraged, but they still help me."  "I've been in places like this before, so it doesn't really bother me."  Pt was drowsey and drifted off several times.  Daryel Geraldorth, Dina Mobley B 08/07/2013 12:12 PM

## 2013-08-07 NOTE — Progress Notes (Signed)
Pt is in bed resting with eyes closed  No distress noted   Pt on 1:1 for self harm behaviors   Safe at present

## 2013-08-08 LAB — GC/CHLAMYDIA PROBE AMP
CT Probe RNA: NEGATIVE
GC Probe RNA: NEGATIVE

## 2013-08-08 MED ORDER — BENZTROPINE MESYLATE 1 MG PO TABS
1.0000 mg | ORAL_TABLET | Freq: Every day | ORAL | Status: DC
  Administered 2013-08-08: 22:00:00 via ORAL
  Administered 2013-08-09 – 2013-08-12 (×4): 1 mg via ORAL
  Filled 2013-08-08 (×6): qty 1
  Filled 2013-08-08: qty 14
  Filled 2013-08-08: qty 1

## 2013-08-08 MED ORDER — ZIPRASIDONE MESYLATE 20 MG IM SOLR
20.0000 mg | Freq: Once | INTRAMUSCULAR | Status: AC
  Administered 2013-08-08: 20 mg via INTRAMUSCULAR
  Filled 2013-08-08: qty 20

## 2013-08-08 MED ORDER — HALOPERIDOL 5 MG PO TABS
10.0000 mg | ORAL_TABLET | Freq: Every day | ORAL | Status: DC
  Administered 2013-08-08 – 2013-08-12 (×5): 10 mg via ORAL
  Filled 2013-08-08 (×7): qty 2
  Filled 2013-08-08: qty 28

## 2013-08-08 MED ORDER — DIPHENHYDRAMINE HCL 50 MG/ML IJ SOLN
INTRAMUSCULAR | Status: AC
  Administered 2013-08-08: 50 mg via INTRAMUSCULAR
  Filled 2013-08-08: qty 1

## 2013-08-08 MED ORDER — LORAZEPAM 2 MG/ML IJ SOLN
2.0000 mg | Freq: Once | INTRAMUSCULAR | Status: AC
  Administered 2013-08-08: 2 mg via INTRAMUSCULAR

## 2013-08-08 MED ORDER — ZIPRASIDONE MESYLATE 20 MG IM SOLR
INTRAMUSCULAR | Status: AC
  Administered 2013-08-08: 20 mg via INTRAMUSCULAR
  Filled 2013-08-08: qty 20

## 2013-08-08 MED ORDER — TRAZODONE HCL 100 MG PO TABS: 100.0000 mg | ORAL_TABLET | Freq: Every evening | ORAL | Status: DC | PRN

## 2013-08-08 MED ORDER — DIPHENHYDRAMINE HCL 50 MG/ML IJ SOLN
50.0000 mg | Freq: Once | INTRAMUSCULAR | Status: AC
  Administered 2013-08-08: 50 mg via INTRAMUSCULAR
  Filled 2013-08-08: qty 1

## 2013-08-08 MED ORDER — LORAZEPAM 2 MG/ML IJ SOLN
INTRAMUSCULAR | Status: AC
  Administered 2013-08-08: 2 mg via INTRAMUSCULAR
  Filled 2013-08-08: qty 1

## 2013-08-08 MED ORDER — TRAZODONE HCL 100 MG PO TABS
100.0000 mg | ORAL_TABLET | Freq: Every day | ORAL | Status: DC
Start: 2013-08-08 — End: 2013-08-10
  Administered 2013-08-08 – 2013-08-09 (×2): 100 mg via ORAL
  Filled 2013-08-08 (×4): qty 1

## 2013-08-08 NOTE — Progress Notes (Signed)
Patient ID: [name], male   DOB: 11-Jan-1991, 23 y.o.   MRN: [id number] Psychoeducational Group Note  Date:  08/08/2013 Time:0910am  Group Topic/Focus:  Identifying Needs:   The focus of this group is to help patients identify their personal needs that have been historically problematic and identify healthy behaviors to address their needs.  Participation Level:  Active  Participation Quality:  Appropriate  Affect:  Appropriate  Cognitive:  Appropriate  Insight:  Supportive  Engagement in Group:  Supportive  Additional Comments:  Inventory group   [name], Florean Hoobler Brooks 08/08/2013,2:14 PM

## 2013-08-08 NOTE — Progress Notes (Signed)
Patient ID: [name], male   DOB: [birth year], 23 y.o.   MRN: [id number] Psychoeducational Group Note  Date:  08/08/2013 Time:0930am  Group Topic/Focus:  Identifying Needs:   The focus of this group is to help patients identify their personal needs that have been historically problematic and identify healthy behaviors to address their needs.  Participation Level:  Active  Participation Quality:  Appropriate  Affect:  Anxious  Cognitive:  Appropriate  Insight:  Supportive  Engagement in Group:  Supportive  Additional Comments:  Healthy coping skills.   [name], [name] 08/08/2013,2:15 PM

## 2013-08-08 NOTE — [facility] Group Notes (Signed)
[facility] Group Notes:  (Clinical Social Work)  08/08/2013  11:00-11:45AM  Summary of Progress/Problems:   The main focus of today's process group was for the patient to identify ways in which they have in the past sabotaged their own recovery and reasons they may have done this/what they received from doing it.  We then worked to identify a specific plan to avoid doing this when discharged from the hospital for this admission.  The patient initially expressed appropriately that one reason he stops medication when he is discharged from the hospital is that he does not like the side effects of it, that often the medication makes him feel tired.  CSW attempted to process what can be done about this in terms of communicating with the doctor about the side effects.  The patient instead went in the direction of not trusting doctors and was resistant to the ideas presented by CSW and group about how to develop trust.  It was mentioned that patients of any sort should know what medications they are taking, and CSW emphasized that we all have a right to know what we are taking and what the medication is for.  The patient said that if he knows that a medication has a bad side-effect he is not going to take it, that he would rather die without it first, even if his chances of dying without it are significantly greater than if he took the medication.  CSW attempted several times to demonstrate to him what he was saying, and put the question out to the group members who all disagreed with him.  However, this did not help to improve his insight at all.  Type of Therapy:  Group Therapy - Process  Participation Level:  Active  Participation Quality:  Resistant and Sharing  Affect:  Flat  Cognitive:  Alert  Insight:  Lacking  Engagement in Therapy:  Improving  Modes of Intervention:  Clarification, Education, Exploration, Discussion  Ambrose MantleMareida Grossman-Orr, LCSW 08/08/2013, 12:53 PM

## 2013-08-08 NOTE — Progress Notes (Signed)
Patient ID: [name], male   DOB: 01-08-1991, 23 y.o.   MRN: [id number] 1-1 Monitoring Note. D. Patient continues to require frequent redirection from staff. [name] continues to be easily agitated and have various somatic complaints. A. 1.1. Monitoring in place for pt safety. R. Patient is safe. Will continue to monitor 1.1. As ordered.

## 2013-08-08 NOTE — Progress Notes (Signed)
[facility] Group Notes:  (Nursing/MHT/Case Management/Adjunct)  Date:  08/08/2013  Time:  10:34 PM  Type of Therapy:  Psychoeducational Skills  Participation Level:  Active  Participation Quality:  Appropriate  Affect:  Anxious  Cognitive:  Appropriate  Insight:  Improving  Engagement in Group:  Improving  Modes of Intervention:  Education  Summary of Progress/Problems: The patient expressed in group this evening that he felt the same as he did earlier in the day. He stated in group that he did not agree with the medication changes that were made today. He felt that he knew more about his medications than the doctor and that he would like to receive Zyprexa. As a theme for the day, his support system will be comprised of his mother, stepfather, friends, and brothers.   Hazle CocaGOODMAN, Lakaisha Danish S 08/08/2013, 10:34 PM

## 2013-08-08 NOTE — Progress Notes (Signed)
Patient ID: [name], male   DOB: Apr 26, 1990, 23 y.o.   MRN: [id number]  [facility]  08/08/2013 1:17 PM [name]  MRN:  [id number] Subjective: " I did not sleep well last night, I am still hearing voices telling me to hurt myself or somebody. Also, my night medications makes me so drowsy and dizzy.''  Objective: Patient is seen and his chart is reviewed with the members of the treatment team. Patient continues to have mood lability, irritable mood, difficulty controlling his anger, racing thoughts, auditory/visual hallucinations and poor impulse control as evidenced by scratching himself with his finger nails. His is very somatic and demanding and verbalizes that he hearing the voice of [name] who is an Hispanic who tells him good stuffs and [name] who is a girl that tells him bad stuffs. He is endorsing recurrent suicidal thoughts but says he has no plan to end his own life. Patient is very paranoid, he is fixated on people trying to harm him. Diagnosis:   DSM5: Schizophrenia Disorders:  Delusional Disorder (297.1) and  Psychotic Disorder (298.8) Obsessive-Compulsive Disorders:   Trauma-Stressor Disorders:   Substance/Addictive Disorders:   Depressive Disorders:  Disruptive Mood Dysregulation Disorder (296.99) Total Time spent with patient: 30 minutes  Axis I: Schizoaffective disorder bipolar type  Axis II: Cluster B Traits Axis III:  Past Medical History  Diagnosis Date  . Asthma   . Bipolar 1 disorder   . ODD (oppositional defiant disorder)   . ADHD (attention deficit hyperactivity disorder)   . Schizophrenia    Axis IV: other psychosocial or environmental problems and problems related to social environment  ADL's:  Intact  Sleep: Fair  Appetite:  Fair  Suicidal Ideation: yes Plan:  denies Intent:  denies Means:  denies Homicidal Ideation:   AEB (as evidenced by):  Psychiatric Specialty Exam: Physical Exam  Psychiatric: His mood appears anxious.  His affect is labile. His speech is rapid and/or pressured. He is aggressive and actively hallucinating. Thought content is paranoid and delusional. Cognition and memory are normal. He expresses impulsivity. He exhibits a depressed mood. He expresses suicidal ideation.    Review of Systems  Constitutional: Negative.   HENT: Negative.   Eyes: Negative.   Respiratory: Negative.   Cardiovascular: Negative.   Gastrointestinal: Negative.   Genitourinary: Negative.   Musculoskeletal: Negative.   Skin: Negative.   Neurological: Negative.   Endo/Heme/Allergies: Negative.   Psychiatric/Behavioral: Positive for depression, suicidal ideas and hallucinations. The patient is nervous/anxious.     Blood pressure 125/80, pulse 71, temperature 96.7 F (35.9 C), temperature source Oral, resp. rate 18, height 5\' 11"  (1.803 m), weight 112.946 kg (249 lb), SpO2 100.00%.Body mass index is 34.74 kg/(m^2).  General Appearance: Casual  Eye Contact::  Good  Speech:  Pressured  Volume:  Increased  Mood:  Angry and Irritable  Affect:  Labile  Thought Process:  Circumstantial and Disorganized  Orientation:  Full (Time, Place, and Person)  Thought Content:  Delusions and Hallucinations: Auditory  Suicidal Thoughts:  Yes.  without intent/plan  Homicidal Thoughts:  No  Memory:  Immediate;   Fair Recent;   Fair Remote;   Fair  Judgement:  Impaired  Insight:  Shallow  Psychomotor Activity:  Increased  Concentration:  Fair  Recall:  [employer]Fair  Fund of Knowledge:Fair  Language: Good  Akathisia:  No  Handed:  Right  AIMS (if indicated):     Assets:  Communication Skills Desire for Improvement Physical Health  Sleep:  Number of  Hours: 5.5   Musculoskeletal: Strength & Muscle Tone: within normal limits Gait & Station: normal Patient leans: N/A  Current Medications: Current Facility-Administered Medications  Medication Dose Route Frequency Provider Last Rate Last Dose  . acetaminophen (TYLENOL) tablet 650  mg  650 mg Oral Q6H PRN Fransisca KaufmannLaura Davis, NP      . alum & mag hydroxide-simeth (MAALOX/MYLANTA) 200-200-20 MG/5ML suspension 30 mL  30 mL Oral Q4H PRN Fransisca KaufmannLaura Davis, NP   30 mL at 08/05/13 0615  . carbamazepine (TEGRETOL XR) 12 hr tablet 200 mg  200 mg Oral BID Briteny Fulghum   200 mg at 08/08/13 0816  . ibuprofen (ADVIL,MOTRIN) tablet 800 mg  800 mg Oral Q8H PRN [name], PA-C   800 mg at 08/05/13 2019  . LORazepam (ATIVAN) injection 2 mg  2 mg Intramuscular Once IntelSpencer E Simon, PA-C      . LORazepam (ATIVAN) tablet 1 mg  1 mg Oral Q6H PRN Fransisca KaufmannLaura Davis, NP   1 mg at 08/07/13 2036  . magnesium hydroxide (MILK OF MAGNESIA) suspension 30 mL  30 mL Oral Daily PRN Fransisca KaufmannLaura Davis, NP      . neomycin-bacitracin-polymyxin (NEOSPORIN) ointment   Topical Daily Fransisca KaufmannLaura Davis, NP   15 application at 08/08/13 0816  . OLANZapine zydis (ZYPREXA) disintegrating tablet 10 mg  10 mg Oral Q8H PRN Fransisca KaufmannLaura Davis, NP   10 mg at 08/08/13 0606  . OLANZapine zydis (ZYPREXA) disintegrating tablet 20 mg  20 mg Oral QHS Berkeley Veldman   20 mg at 08/07/13 2036  . pantoprazole (PROTONIX) EC tablet 40 mg  40 mg Oral QHS Damauri Minion      . polyvinyl alcohol (LIQUIFILM TEARS) 1.4 % ophthalmic solution 1 drop  1 drop Both Eyes PRN Fransisca KaufmannLaura Davis, NP   1 drop at 08/07/13 2000    Lab Results:  Results for orders placed during the hospital encounter of 08/04/13 (from the past 48 hour(s))  URINALYSIS, ROUTINE W REFLEX MICROSCOPIC     Status: None   Collection Time    08/06/13 11:00 PM      Result Value Ref Range   Color, Urine YELLOW  YELLOW   APPearance CLEAR  CLEAR   Specific Gravity, Urine 1.016  1.005 - 1.030   pH 5.5  5.0 - 8.0   Glucose, UA NEGATIVE  NEGATIVE mg/dL   Hgb urine dipstick NEGATIVE  NEGATIVE   Bilirubin Urine NEGATIVE  NEGATIVE   Ketones, ur NEGATIVE  NEGATIVE mg/dL   Protein, ur NEGATIVE  NEGATIVE mg/dL   Urobilinogen, UA 1.0  0.0 - 1.0 mg/dL   Nitrite NEGATIVE  NEGATIVE   Leukocytes, UA NEGATIVE   NEGATIVE   Comment: MICROSCOPIC NOT DONE ON URINES WITH NEGATIVE PROTEIN, BLOOD, LEUKOCYTES, NITRITE, OR GLUCOSE <1000 mg/dL.     [facility]  GC/CHLAMYDIA PROBE AMP     Status: None   Collection Time    08/06/13 11:00 PM      Result Value Ref Range   CT Probe RNA NEGATIVE  NEGATIVE   GC Probe RNA NEGATIVE  NEGATIVE   Comment: (NOTE)                                                                                               **  Normal Reference Range: Negative**          Assay performed using the Gen-Probe APTIMA COMBO2 (R) Assay.     Acceptable specimen types for this assay include APTIMA Swabs (Unisex,     endocervical, urethral, or vaginal), first void urine, and ThinPrep     liquid based cytology samples.     Performed at [facility]    Physical Findings: AIMS: Facial and Oral Movements Muscles of Facial Expression: None, normal Lips and Perioral Area: None, normal Jaw: None, normal Tongue: None, normal,Extremity Movements Upper (arms, wrists, hands, fingers): None, normal Lower (legs, knees, ankles, toes): None, normal,  , Overall Severity Severity of abnormal movements (highest score from questions above): None, normal Incapacitation due to abnormal movements: None, normal Patient's awareness of abnormal movements (rate only patient's report): No Awareness, Dental Status Current problems with teeth and/or dentures?: No Does patient usually wear dentures?: No  CIWA:    COWS:     Treatment Plan Summary: Daily contact with patient to assess and evaluate symptoms and progress in treatment Medication management  Plan:1. Admit for crisis management and stabilization. 2. Medication management to reduce current symptoms to base line and improve the  patient's overall level of functioning: -Discontinue Zyprexa -Initiate Haldol 10mg  po Qhs for psychosis/delusions -Initiate Trazodone 100mg  po Qhs for insomnia -Initiate Cogentin 1mg  po  qhs for EPS prevention. -Continue Zyprexa Zydis 10mg  po q8 as needed for Agitation/psychosis -Continue Tegretol XR 200mg  bid for mood stabilization. 3. Treat health problems as indicated. 4. Develop treatment plan to decrease risk of relapse upon discharge and the need for readmission. 5. Psycho-social education regarding relapse prevention and self care. 6. Health care follow up as needed for medical problems. 7. Continue 1:1 observation for safety.    Medical Decision Making Problem Points:  Established problem, worsening (2), Review of last therapy session (1) and Review of psycho-social stressors (1) Data Points:  Order Aims Assessment (2) Review or order clinical lab tests (1) Review of medication regiment & side effects (2)  I certify that inpatient services furnished can reasonably be expected to improve the patient's condition.   Thedore Mins, MD 08/08/2013, 1:17 PM

## 2013-08-08 NOTE — Progress Notes (Signed)
Patient ID: [name], male   DOB: 01/14/1991, 23 y.o.   MRN: [id number] 1-1 Monitoring Note. D. Patient continues to require frequent redirection from staff. [name] continues to be easily agitated and have various somatic complaints. A. 1.1. Monitoring in place for pt safety. R. Patient is safe. Will continue to monitor 1.1. As ordered.    

## 2013-08-08 NOTE — Progress Notes (Signed)
Patient ID: [name], male   DOB: [birth year], 23 y.o.   MRN: [id number] 1:1 Nursing notes: The patient is resting in bed with eyes closed. No distress noted. 1:1 maintained for safety. Will continue to monitor.

## 2013-08-08 NOTE — Progress Notes (Signed)
Patient ID: [name], male   DOB: [birth year], 23 y.o.   MRN: [id number] D. Pt reported to MD and staff ''I'm hearing voices in my head and they won't stop. I need some medication. '' A. Orders received from MD for Geodon 20mg  IM , Benadryl 50mg  IM and Ativan 2mg  IM stat.  Pt offered medications and accepted without issue. Pt later agitated requesting to speak to nurse and MD repeatedly questioning medications stating '' I know why the doctor ordered the shot for me, he knew I wouldn't take it in the pill form. He gave me something I'll have an allergic reaction to . '' Patient was educated but continued to show poor understanding of education. He then demanded to go to the quiet room, but immediately came back to unit requesting to speak to MD again. Pt was educated regarding medications and his complaints. Staff continue to have to redirect patient frequently. R. Patient is currently resting quietly, will continue to monitor q 15 minutes for safety.

## 2013-08-08 NOTE — Progress Notes (Signed)
Patient ID: [name], male   DOB: 1990/03/01, 23 y.o.   MRN: [id number] 1:1 Nursing Notes: The patient woke up in a better mood this morning. He was polite and respectful and stated that he hoped he would have a better day today. Requested his medication and was medicated with Zyprexia Zydis PO prn. C/o having some stomach distress. He did not want the Maalox that was available to him and instead opted to drink some ginger ale. No further episodes of self injury. 1:1 maintained for safety. Will continue to monitor.

## 2013-08-08 NOTE — Progress Notes (Signed)
Patient ID: [name], male   DOB: 1990-04-18, 23 y.o.   MRN: [id number] 1-1 Monitoring Note. D. Pt continues to be very intrusive, irritable and labile with somatic complaints again today. He continues to require 1.1 staff for redirection and unpredictable behaviors. Pt has been continuously speaking with staff again today due to various somatic complaints. He was resistant to taking his medication this morning stating ''It makes me tired. The doctor doesn't know my body, he should know that I can't be taking five pills and I don't need medication. '' Patient later did take the medication but he continues to show poor insight. His speech remains pressured and tangential.  A. 1.1. Monitored due to patients continued labile mood and unpredictable behaviors, and for patients safety. He remains very manipulative on the unit. R. Patient continues to require frequent redirection from staff, again today. Will continue to monitor 1/1 as ordered.

## 2013-08-08 NOTE — Progress Notes (Signed)
Patient ID: [name], male   DOB: 1990/11/22, 23 y.o.   MRN: [id number] 1:1 Nursing Note: The patient was in better control of his behavior this evening. He was upset about the doctor making changes to his medications, but was able to express his anger verbally using appropriate language. Praised for maintaining control. Reports that the auditory hallucinations have subsided and he attributes this to the Zyprexa. He was not pleased with the medication changes and initially refused his HS medication, agreed to take all of it and have a discussion with the doctor tomorrow. 1:1 maintained for safety. Will continue to monitor.

## 2013-08-09 NOTE — Progress Notes (Signed)
Patient ID: [name], male   DOB: 06/28/1990, 23 y.o.   MRN: [id number] 1-1 Monitoring Note. D. Patient has been interactive on the unit. [name] states '' I want to talk to the doctor about coming off 1.1. '' I was good yesterday, and I'm doing better. He is able this morning to contract for safety. A. 1.1. Monitoring in place for pt safety. R. Patient is safe. Will continue to monitor 1.1. As ordered.

## 2013-08-09 NOTE — Progress Notes (Signed)
Patient ID: Mare LoanRazell Camero, male   DOB: [birth year], 23 y.o.   MRN: [id number] Psychoeducational Group Note  Date:  08/09/2013 Time:  0930am  Group Topic/Focus:  Making Healthy Choices:   The focus of this group is to help patients identify negative/unhealthy choices they were using prior to admission and identify positive/healthier coping strategies to replace them upon discharge.  Participation Level:  Active  Participation Quality:  Monopolizing  Affect:  Blunted and Irritable  Cognitive:  Appropriate  Insight:  Lacking  Engagement in Group:  Lacking  Additional Comments:  Healthy support systems.   [name], Tykia Mellone Brooks 08/09/2013,10:27 AM

## 2013-08-09 NOTE — Progress Notes (Signed)
Patient ID: [name], male   DOB: 11-Feb-1991, 23 y.o.   MRN: [id number] D. The patient is calmer this evening with decreased mood lability, agitation and impulsivity. He is reporting decreased auditory hallucinations but states he occasionally hears voices talking to him. His behavior can still be unpredictable.  A. Met with patient frequently throughout the evening to assess and give verbal support. Encouraged to attend evening group. Reviewed and administered HS medication. R. The patient attended and actively participated in evening group. Responds well to positive reinforcement. Contracts for safety. Compliant with medication.

## 2013-08-09 NOTE — Progress Notes (Signed)
[facility] Post 1:1 Observation Documentation  For the first (8) hours following discontinuation of 1:1 precautions, a progress note entry by nursing staff should be documented at least every 2 hours, reflecting the patient's behavior, condition, mood, and conversation.  Use the progress notes for additional entries.  Time 1:1 discontinued:    Patient's Behavior:  The patient is calm and maintaining control. Interacting appropriately in the milieu. Patient's Condition:  His thoughts are relevant and logical, but still experiencing auditory hallucinations.  Patient's Conversation:   He requested prn medication for auditory hallucinations.  Talmadge ChadGlover, Briahna Pescador Lynn 08/09/2013, 8:00 PM

## 2013-08-09 NOTE — Progress Notes (Signed)
Patient ID: [name], male   DOB: October 05, 1990, 23 y.o.   MRN: [id number] 1:1 Nursing Note: The patient is awake in bathroom performing his ADLs. He did not sleep well last night and complained of having bad dreams. He is pleasant and a little more low key this morning. Denies any a/v hallucinations. Denies any thoughts of self harm and verbally contracted for safety. 1:1 maintained for safety. Will continue to monitor.

## 2013-08-09 NOTE — Progress Notes (Signed)
Patient ID: [name], male   DOB: 12-25-1990, [age] y.o.   MRN:  Post 1-1 Monitoring Note. D. Patient is cooperative and able to contract for safety. Orders received for 1.1. To be discontinued. His behavior is calm and cooperative, attending unit programming. Pt mood is appropriate. A. Support and encouragement provided. R. Patient is in no acute distress at this time.

## 2013-08-09 NOTE — Progress Notes (Signed)
[facility] Post 1:1 Observation Documentation  For the first (8) hours following discontinuation of 1:1 precautions, a progress note entry by nursing staff should be documented at least every 2 hours, reflecting the patient's behavior, condition, mood, and conversation.  Use the progress notes for additional entries.  Time 1:1 discontinued:  1050  Patient's Behavior:  Cooperative, calm and appropriate  Patient's Condition:  Appropriate   Patient's Conversation: appropriate, '' i just hope to go home next week''   Malva LimesStrader, Maryn Freelove 08/09/2013, 6:00 PM

## 2013-08-09 NOTE — Progress Notes (Signed)
[facility] Post 1:1 Observation Documentation  For the first (8) hours following discontinuation of 1:1 precautions, a progress note entry by nursing staff should be documented at least every 2 hours, reflecting the patient's behavior, condition, mood, and conversation.  Use the progress notes for additional entries.  Time 1:1 discontinued:  1050  Patient's Behavior:  Calm, cooperative   Patient's Condition:  appropriate  Patient's Conversation:  ''I'm doing fine.[name]''  [name] 08/09/2013, 4:23 PM

## 2013-08-09 NOTE — Progress Notes (Signed)
Patient ID: [name], male   DOB: 10/12/1990, 23 y.o.   MRN: [id number] 1:1 Nursing Note: The patient was resting in bed with eyes closed. No distress noted. 1:1 maintained for safety. Will continue to monitor for safety.

## 2013-08-09 NOTE — Progress Notes (Signed)
[facility] Group Notes:  (Nursing/MHT/Case Management/Adjunct)  Date:  08/09/2013  Time:  9:26 PM  Type of Therapy:  Psychoeducational Skills  Participation Level:  Active  Participation Quality:  Appropriate  Affect:  Appropriate  Cognitive:  Appropriate  Insight:  Improving  Engagement in Group:  Developing/Improving  Modes of Intervention:  Education  Summary of Progress/Problems: The patient stated that he had a good day overall, but would not offer any additional details. As a goal for tomorrow, he intends to focus more on himself and take all of his medication.   Hazle CocaGOODMAN, Angeliah Wisdom S 08/09/2013, 9:26 PM

## 2013-08-09 NOTE — Progress Notes (Signed)
Patient ID: [name], male   DOB: Feb 03, 1991, 23 y.o.   MRN: [id number] Post 1-1 Monitoring Note. D. Patient is cooperative and able to contract for safety.His behavior is calm and cooperative, attending unit programming. Pt mood is appropriate. A. Support and encouragement provided. R. Patient is in no acute distress at this time.

## 2013-08-09 NOTE — [facility] Group Notes (Signed)
[facility] Group Notes:  (Clinical Social Work)  08/09/2013   11:15am-12:00pm  Summary of Progress/Problems:  The main focus of today's process group was to listen to a variety of genres of music and to identify that different types of music provoke different responses.  The patient then was able to identify personally what was soothing for them, as well as energizing.  Handouts were used to record feelings evoked, as well as how patient can personally use this knowledge in sleep habits, with depression, and with other symptoms.  The patient expressed understanding of concepts, as well as knowledge of how each type of music affected him/her and how this can be used at home as a wellness/recovery tool.  He slept through the early part of group, but was eager to catch up once he awakened.  Type of Therapy:  Music Therapy   Participation Level:  Active  Participation Quality:  Attentive and Sharing  Affect:  Flat  Cognitive:  Oriented  Insight:  Engaged  Engagement in Therapy:  Engaged  Modes of Intervention:   Activity, Exploration  Ambrose MantleMareida Grossman-Orr, LCSW 08/09/2013, 12:30pm

## 2013-08-09 NOTE — Progress Notes (Signed)
Patient ID: [name], male   DOB: [birth year], 23 y.o.   MRN: [id number] Psychoeducational Group Note  Date:  08/09/2013 Time:  0910am  Group Topic/Focus:  Making Healthy Choices:   The focus of this group is to help patients identify negative/unhealthy choices they were using prior to admission and identify positive/healthier coping strategies to replace them upon discharge.  Participation Level:  Active  Participation Quality:  Intrusive  Affect:  Blunted  Cognitive:  Appropriate  Insight:  Resistant  Engagement in Group:  Resistant  Additional Comments:  Inventory group- pt is on a 1:1.   [name], [name] 08/09/2013,10:26 AM

## 2013-08-09 NOTE — Progress Notes (Signed)
Patient ID: [name], male   DOB: 1991/01/20, 23 y.o.   MRN: [id number]  [facility]  08/09/2013 2:40 PM Suresh Laural BenesJohnson  MRN:  [id number] Subjective: " I slept better last night but I am still hearing voices.''  Objective: Patient is seen and his chart is reviewed with the members of the treatment team. Patient reports decreased mood lability, agitation, impulsivity, racing thoughts and auditory/visual hallucinations. He is reporting decreased auditory hallucinations and suicidal thoughts. However, his behavior can be unpredictable.  Patient is very paranoid, he is fixated on people trying to harm him and repeatedly saying that the police are after him. He is now compliant with his medications and has not verbalized any adverse reactions. Diagnosis:   DSM5: Schizophrenia Disorders:  Delusional Disorder (297.1) and  Psychotic Disorder (298.8) Obsessive-Compulsive Disorders:   Trauma-Stressor Disorders:   Substance/Addictive Disorders:   Depressive Disorders:  Disruptive Mood Dysregulation Disorder (296.99) Total Time spent with patient: 30 minutes  Axis I: Schizoaffective disorder bipolar type  Axis II: Cluster B Traits Axis III:  Past Medical History  Diagnosis Date  . Asthma   . Bipolar 1 disorder   . ODD (oppositional defiant disorder)   . ADHD (attention deficit hyperactivity disorder)   . Schizophrenia    Axis IV: other psychosocial or environmental problems and problems related to social environment  ADL's:  Intact  Sleep: Fair  Appetite:  Fair  Suicidal Ideation: yes Plan:  denies Intent:  denies Means:  denies Homicidal Ideation:   AEB (as evidenced by):  Psychiatric Specialty Exam: Physical Exam  Psychiatric: His mood appears anxious. His affect is labile. His speech is rapid and/or pressured. He is aggressive and actively hallucinating. Thought content is paranoid and delusional. Cognition and memory are normal. He expresses impulsivity. He  exhibits a depressed mood. He expresses suicidal ideation.    Review of Systems  Constitutional: Negative.   HENT: Negative.   Eyes: Negative.   Respiratory: Negative.   Cardiovascular: Negative.   Gastrointestinal: Negative.   Genitourinary: Negative.   Musculoskeletal: Negative.   Skin: Negative.   Neurological: Negative.   Endo/Heme/Allergies: Negative.   Psychiatric/Behavioral: Positive for depression, suicidal ideas and hallucinations. The patient is nervous/anxious.     Blood pressure 115/76, pulse 79, temperature 96.5 F (35.8 C), temperature source Oral, resp. rate 18, height 5\' 11"  (1.803 m), weight 112.946 kg (249 lb), SpO2 100.00%.Body mass index is 34.74 kg/(m^2).  General Appearance: Casual  Eye Contact::  Good  Speech:  Pressured  Volume:  Increased  Mood:  Angry and Irritable  Affect:  Labile  Thought Process:  Circumstantial and Disorganized  Orientation:  Full (Time, Place, and Person)  Thought Content:  Delusions and Hallucinations: Auditory  Suicidal Thoughts:  Yes.  without intent/plan  Homicidal Thoughts:  No  Memory:  Immediate;   Fair Recent;   Fair Remote;   Fair  Judgement:  Impaired  Insight:  Shallow  Psychomotor Activity:  Increased  Concentration:  Fair  Recall:  [employer]Fair  Fund of Knowledge:Fair  Language: Good  Akathisia:  No  Handed:  Right  AIMS (if indicated):     Assets:  Communication Skills Desire for Improvement Physical Health  Sleep:  Number of Hours: 5.5   Musculoskeletal: Strength & Muscle Tone: within normal limits Gait & Station: normal Patient leans: N/A  Current Medications: Current Facility-Administered Medications  Medication Dose Route Frequency Provider Last Rate Last Dose  . acetaminophen (TYLENOL) tablet 650 mg  650 mg Oral Q6H PRN  Fransisca Kaufmann, NP      . alum & mag hydroxide-simeth (MAALOX/MYLANTA) 200-200-20 MG/5ML suspension 30 mL  30 mL Oral Q4H PRN Fransisca Kaufmann, NP   30 mL at 08/05/13 0615  . benztropine  (COGENTIN) tablet 1 mg  1 mg Oral QHS Oceane Fosse      . carbamazepine (TEGRETOL XR) 12 hr tablet 200 mg  200 mg Oral BID Steel Kerney   200 mg at 08/09/13 0630  . haloperidol (HALDOL) tablet 10 mg  10 mg Oral QHS Kamil Mchaffie   10 mg at 08/08/13 2137  . ibuprofen (ADVIL,MOTRIN) tablet 800 mg  800 mg Oral Q8H PRN Kerry Hough, PA-C   800 mg at 08/08/13 2001  . LORazepam (ATIVAN) injection 2 mg  2 mg Intramuscular Once [employer], PA-C      . LORazepam (ATIVAN) tablet 1 mg  1 mg Oral Q6H PRN Fransisca Kaufmann, NP   1 mg at 08/09/13 0025  . magnesium hydroxide (MILK OF MAGNESIA) suspension 30 mL  30 mL Oral Daily PRN Fransisca Kaufmann, NP      . neomycin-bacitracin-polymyxin (NEOSPORIN) ointment   Topical Daily Fransisca Kaufmann, NP   1 application at 08/09/13 0831  . OLANZapine zydis (ZYPREXA) disintegrating tablet 10 mg  10 mg Oral Q8H PRN Fransisca Kaufmann, NP   10 mg at 08/09/13 0025  . pantoprazole (PROTONIX) EC tablet 40 mg  40 mg Oral QHS Haiden Rawlinson   40 mg at 08/08/13 2137  . polyvinyl alcohol (LIQUIFILM TEARS) 1.4 % ophthalmic solution 1 drop  1 drop Both Eyes PRN Fransisca Kaufmann, NP   1 drop at 08/07/13 2000  . traZODone (DESYREL) tablet 100 mg  100 mg Oral QHS Shantella Blubaugh   100 mg at 08/08/13 2137    Lab Results:  No results found for this or any previous visit (from the past 48 hour(s)).  Physical Findings: AIMS: Facial and Oral Movements Muscles of Facial Expression: None, normal Lips and Perioral Area: None, normal Jaw: None, normal Tongue: None, normal,Extremity Movements Upper (arms, wrists, hands, fingers): None, normal Lower (legs, knees, ankles, toes): None, normal,  , Overall Severity Severity of abnormal movements (highest score from questions above): None, normal Incapacitation due to abnormal movements: None, normal Patient's awareness of abnormal movements (rate only patient's report): No Awareness, Dental Status Current problems with teeth and/or dentures?: No Does  patient usually wear dentures?: No  CIWA:    COWS:     Treatment Plan Summary: Daily contact with patient to assess and evaluate symptoms and progress in treatment Medication management  Plan:1. Admit for crisis management and stabilization. 2. Medication management to reduce current symptoms to base line and improve the  patient's overall level of functioning: -Discontinue Zyprexa -Continue Haldol 10mg  po Qhs for psychosis/delusions -Continue Trazodone 100mg  po Qhs for insomnia -Continue Cogentin 1mg  po qhs for EPS prevention. -Continue Zyprexa Zydis 10mg  po q8 as needed for Agitation/psychosis -Continue Tegretol XR 200mg  bid for mood stabilization. 3. Treat health problems as indicated. 4. Develop treatment plan to decrease risk of relapse upon discharge and the need for readmission. 5. Psycho-social education regarding relapse prevention and self care. 6. Health care follow up as needed for medical problems. 7. Discontinue 1:1 observation     Medical Decision Making Problem Points:  Established problem, improving (1), Review of last therapy session (1) and Review of psycho-social stressors (1) Data Points:  Order Aims Assessment (2) Review or order clinical lab tests (1) Review of medication regiment &  side effects (2)  I certify that inpatient services furnished can reasonably be expected to improve the patient's condition.   Thedore MinsAkintayo, Vern Prestia, MD 08/09/2013, 2:40 PM

## 2013-08-10 MED ORDER — TRAZODONE HCL 150 MG PO TABS
150.0000 mg | ORAL_TABLET | Freq: Every day | ORAL | Status: DC
  Administered 2013-08-10 – 2013-08-12 (×3): 150 mg via ORAL
  Filled 2013-08-10 (×4): qty 1
  Filled 2013-08-10: qty 14

## 2013-08-10 NOTE — Progress Notes (Signed)
Adult Activity Group Note   Group Topic: Art  Participation Level: Minimal  Participation Quality: Attentive and Resistant  Affect:  Blunted  Activity: Patients asked to create art to coincide with daily unit theme using any combination of markers, crayons, color pencils, construction paper, magazine clippings, glue, and scissors.   Additional Comments: Pt was encouraged to draw a picture of Wellness. Pt drew a picture of weights. He enjoys lifting wts. Pt did the activity quickly but was attentive during group.

## 2013-08-10 NOTE — Progress Notes (Signed)
Patient ID: [name], male   DOB: 16-May-1990, 23 y.o.   MRN: [id number]  [facility] MD Progress Note  08/10/2013 10:25 AM [name]  MRN:  [id number] Subjective: " I did not sleep well last night but the voices that I have been hearing are less.''  Objective: Patient is seen and his chart is reviewed with the members of the treatment team. Patient reports that he had difficulty sleeping last night but reporting decreased paranoia, mood lability, agitation, impulsivity, racing thoughts and auditory/visual hallucinations. However, he remains somatic and his behavior can be unpredictable. He continues to fixate ob people trying to poison his food and saying that the police are out to get him. He has been attending the unit milieu, compliant with his medications and has not verbalized any adverse reactions. Diagnosis:   DSM5: Schizophrenia Disorders:  Delusional Disorder (297.1) and  Psychotic Disorder (298.8) Obsessive-Compulsive Disorders:   Trauma-Stressor Disorders:   Substance/Addictive Disorders:   Depressive Disorders:  Disruptive Mood Dysregulation Disorder (296.99) Total Time spent with patient: 20 minutes  Axis I: Schizoaffective disorder bipolar type  Axis II: Cluster B Traits Axis III:  Past Medical History  Diagnosis Date  . Asthma   . Bipolar 1 disorder   . ODD (oppositional defiant disorder)   . ADHD (attention deficit hyperactivity disorder)   . Schizophrenia    Axis IV: other psychosocial or environmental problems and problems related to social environment  ADL's:  Intact  Sleep: Fair  Appetite:  Fair  Suicidal Ideation: yes Plan:  denies Intent:  denies Means:  denies Homicidal Ideation:   AEB (as evidenced by):  Psychiatric Specialty Exam: Physical Exam  Psychiatric: His mood appears anxious. His speech is rapid and/or pressured. He is aggressive. Thought content is paranoid and delusional. Cognition and memory are normal. He expresses impulsivity. He  exhibits a depressed mood.    Review of Systems  Constitutional: Negative.   HENT: Negative.   Eyes: Negative.   Respiratory: Negative.   Cardiovascular: Negative.   Gastrointestinal: Negative.   Genitourinary: Negative.   Musculoskeletal: Negative.   Skin: Negative.   Neurological: Negative.   Endo/Heme/Allergies: Negative.   Psychiatric/Behavioral: Positive for depression and hallucinations. The patient is nervous/anxious.     Blood pressure 125/71, pulse 82, temperature 97 F (36.1 C), temperature source Oral, resp. rate 18, height 5\' 11"  (1.803 m), weight 112.946 kg (249 lb), SpO2 100.00%.Body mass index is 34.74 kg/(m^2).  General Appearance: Casual  Eye Contact::  Good  Speech:  Pressured  Volume:  Increased  Mood:  Angry and Irritable  Affect:  Labile  Thought Process:  Circumstantial and Disorganized  Orientation:  Full (Time, Place, and Person)  Thought Content:  Delusions and Hallucinations: Auditory  Suicidal Thoughts:  Yes.  without intent/plan  Homicidal Thoughts:  No  Memory:  Immediate;   Fair Recent;   Fair Remote;   Fair  Judgement:  Impaired  Insight:  Shallow  Psychomotor Activity:  Increased  Concentration:  Fair  Recall:  [employer] of Knowledge:Fair  Language: Good  Akathisia:  No  Handed:  Right  AIMS (if indicated):     Assets:  Communication Skills Desire for Improvement Physical Health  Sleep:  Number of Hours: 5.5   Musculoskeletal: Strength & Muscle Tone: within normal limits Gait & Station: normal Patient leans: N/A  Current Medications: Current Facility-Administered Medications  Medication Dose Route Frequency Provider Last Rate Last Dose  . acetaminophen (TYLENOL) tablet 650 mg  650 mg Oral Q6H PRN  Fransisca KaufmannLaura Davis, NP      . alum & mag hydroxide-simeth (MAALOX/MYLANTA) 200-200-20 MG/5ML suspension 30 mL  30 mL Oral Q4H PRN Fransisca KaufmannLaura Davis, NP   30 mL at 08/05/13 0615  . benztropine (COGENTIN) tablet 1 mg  1 mg Oral QHS [name]    1 mg at 08/09/13 2157  . carbamazepine (TEGRETOL XR) 12 hr tablet 200 mg  200 mg Oral BID [name]   200 mg at 08/10/13 0607  . haloperidol (HALDOL) tablet 10 mg  10 mg Oral QHS [name]   10 mg at 08/09/13 2157  . ibuprofen (ADVIL,MOTRIN) tablet 800 mg  800 mg Oral Q8H PRN [name], PA-C   800 mg at 08/09/13 1706  . LORazepam (ATIVAN) tablet 1 mg  1 mg Oral Q6H PRN Fransisca KaufmannLaura Davis, NP   1 mg at 08/09/13 1952  . magnesium hydroxide (MILK OF MAGNESIA) suspension 30 mL  30 mL Oral Daily PRN Fransisca KaufmannLaura Davis, NP      . neomycin-bacitracin-polymyxin (NEOSPORIN) ointment   Topical Daily Fransisca KaufmannLaura Davis, NP   15 application at 08/10/13 0815  . OLANZapine zydis (ZYPREXA) disintegrating tablet 10 mg  10 mg Oral Q8H PRN Fransisca KaufmannLaura Davis, NP   10 mg at 08/09/13 1952  . pantoprazole (PROTONIX) EC tablet 40 mg  40 mg Oral QHS [name]   40 mg at 08/09/13 2157  . polyvinyl alcohol (LIQUIFILM TEARS) 1.4 % ophthalmic solution 1 drop  1 drop Both Eyes PRN Fransisca KaufmannLaura Davis, NP   1 drop at 08/07/13 2000  . traZODone (DESYREL) tablet 150 mg  150 mg Oral QHS [name]        Lab Results:  No results found for this or any previous visit (from the past 48 hour(s)).  Physical Findings: AIMS: Facial and Oral Movements Muscles of Facial Expression: None, normal Lips and Perioral Area: None, normal Jaw: None, normal Tongue: None, normal,Extremity Movements Upper (arms, wrists, hands, fingers): None, normal Lower (legs, knees, ankles, toes): None, normal,  , Overall Severity Severity of abnormal movements (highest score from questions above): None, normal Incapacitation due to abnormal movements: None, normal Patient's awareness of abnormal movements (rate only patient's report): No Awareness, Dental Status Current problems with teeth and/or dentures?: No Does patient usually wear dentures?: No  CIWA:    COWS:     Treatment Plan Summary: Daily contact with patient to assess and evaluate symptoms and  progress in treatment Medication management  Plan:1. Admit for crisis management and stabilization. 2. Medication management to reduce current symptoms to base line and improve the  patient's overall level of functioning: -Continue Haldol 10mg  po Qhs for psychosis/delusions -Increase Trazodone to 150mg  po Qhs for insomnia -Continue Cogentin 1mg  po qhs for EPS prevention. -Continue Zyprexa Zydis 10mg  po q8 as needed for Agitation/psychosis -Continue Tegretol XR 200mg  bid for mood stabilization. 3. Treat health problems as indicated. 4. Develop treatment plan to decrease risk of relapse upon discharge and the need for readmission. 5. Psycho-social education regarding relapse prevention and self care. 6. Health care follow up as needed for medical problems.     Medical Decision Making Problem Points:  Established problem, improving (1), Review of last therapy session (1) and Review of psycho-social stressors (1) Data Points:  Order Aims Assessment (2) Review or order clinical lab tests (1) Review of medication regiment & side effects (2)  I certify that inpatient services furnished can reasonably be expected to improve the patient's condition.   Thedore MinsAkintayo, Laurita Peron, MD  08/10/2013, 10:25 AM

## 2013-08-10 NOTE — Progress Notes (Signed)
D: pt stated he is wanting to go home and wants to know when he will be going home. Writer confirmed to pt that he will be leaving sometime this week. Pt denies si/hi/avh. Pt c/o HA 7/10. Pt is appropriate on unit. No self harm witnessed. Pt compliant and taking all medications A: ibuprofen 800mg  given for HA. scheduled medications given. q15 min safety check. Support and encouragement offered R: pt remains safe on unit. No signs of distress noted.

## 2013-08-10 NOTE — Progress Notes (Signed)
The focus of this group is to help patients review their daily goal of treatment and discuss progress on daily workbooks. Pt attended the evening group session and responded to all discussion prompts from the Writer. Pt said that today was a good day, the highlight of which was the strawberry ice cream he enjoyed during snack earlier. Pt was focused on knowing when exactly this week he would be discharged and was frustrated that the current shift's Nursing Staff was unable to tell him when. Pt told the group that he planned to drink less and take his meds upon leaving as a way to stay well. Pt appeared distracted during group and left early.

## 2013-08-10 NOTE — [facility] Group Notes (Signed)
[facility] LCSW Group Therapy  08/10/2013 1:15 pm  Type of Therapy: Process Group Therapy  Participation Level:  Active  Participation Quality:  Appropriate  Affect:  Flat  Cognitive:  Oriented  Insight:  Improving  Engagement in Group:  Limited  Engagement in Therapy:  Limited  Modes of Intervention:  Activity, Clarification, Education, Problem-solving and Support  Summary of Progress/Problems: Today's group addressed the issue of overcoming obstacles.  Patients were asked to identify their biggest obstacle post d/c that stands in the way of their on-going success, and then problem solve as to how to manage this.  [name] ParentsRaz talked about his desire to drink alcohol, and how he knows this is problematic.  "I like the taste, and when I am around others who drink, I can't stop."  Others suggested he not go to parties, at least not until he is not feeling tempted, that he not hang out with negative friends, and that he go to AA or NA mtgs.  They even told him of meetings he might like.  He appreciated the feedback.  [name], [name] 08/10/2013   4:39 PM

## 2013-08-10 NOTE — Progress Notes (Signed)
Patient ID: [name], male   DOB: 1990/03/16, 23 y.o.   MRN: [id number]  Activity: Music, Relaxation, and Wellness Pt's listen to music while they closed their eyes and visualized being in a quiet peaceful place.  Pt participated and described his peaceful place being at a war zone. Pt  appeared fixated on war.

## 2013-08-10 NOTE — [facility] Group Notes (Signed)
[facility]   08/10/2013 9:59 AM  Participation Quality:  Engaged  Mood/Affect:  Flat  Depression Rating:  denies  Anxiety Rating:  denies  Thoughts of Suicide:  No Will you contract for safety?   NA  Current AVH:  Denies  Plan for Discharge/Comments:  [name] is focused on a date of d/c.  When I told him I did not know, he initially accepted it, but then came back wanting to know if it was this week.  When I assured him it would be this week, he then wanted to know what day it would be.  "It would help me if I just knew the date."  Transportation Means:  mother  Supports: mother  [name], [name] DaubRodney Singleton

## 2013-08-10 NOTE — Progress Notes (Signed)
Patient ID: [name], male   DOB: December 30, 1990, 23 y.o.   MRN: [id number] D: pt. Somatic reports stomach pain, then diarrhea since three weeks ago at home, but none noted by staff. Pt. Smiling, childlike. A: Writer introduced self to client, reviewed symptoms, encouraged him to speak with physician. Staff will monitor q7015min for safety, group encouraged. R: pt. Is safe on the unit, attended group.

## 2013-08-10 NOTE — Tx Team (Signed)
  Interdisciplinary Treatment Plan Update   Date Reviewed:  08/10/2013  Time Reviewed:  8:20 AM  Progress in Treatment:   Attending groups: Yes Participating in groups: Yes Taking medication as prescribed: Yes  Tolerating medication: Yes Family/Significant other contact made: Yes  Patient understands diagnosis: Yes  Discussing patient identified problems/goals with staff: Yes Medical problems stabilized or resolved: Yes Denies suicidal/homicidal ideation: Yes Patient has not harmed self or others: Yes  For review of initial/current patient goals, please see plan of care.  Estimated Length of Stay:  3-4 days  Reason for Continuation of Hospitalization: Hallucinations Medication stabilization Other; describe mood instability, paranoia  New Problems/Goals identified:  N/A  Discharge Plan or Barriers:   return home, follow up outpt  Additional Comments:  " I slept better last night but I am still hearing voices.'' Erlin demonstrates decreased mood lability, agitation, impulsivity, racing thoughts and auditory/visual hallucinations. He is reporting decreased auditory hallucinations and suicidal thoughts. However, his behavior can be unpredictable. Patient is very paranoid, he is fixated on people trying to harm him and repeatedly saying that the police are after him.   Attendees:  Signature: Thedore MinsMojeed Akintayo, MD 08/10/2013 8:20 AM   Signature: Richelle Itood Letti Towell, LCSW 08/10/2013 8:20 AM  Signature: Fransisca KaufmannLaura Davis, NP 08/10/2013 8:20 AM  Signature: Joslyn Devonaroline Beaudry, RN 08/10/2013 8:20 AM  Signature: Liborio NixonPatrice White, RN 08/10/2013 8:20 AM  Signature:  08/10/2013 8:20 AM  Signature:   08/10/2013 8:20 AM  Signature:    Signature:    Signature:    Signature:    Signature:    Signature:      Scribe for Treatment Team:   Richelle Itood Tameka Hoiland, LCSW  08/10/2013 8:20 AM

## 2013-08-11 NOTE — Progress Notes (Signed)
D: Pt denies SI/HI/AVH. Pt is pleasant and cooperative. Pt avoided Clinical research associatewriter, but answered questions.   A: Pt was offered support and encouragement. Pt was given scheduled medications. Pt was encourage to attend groups. Q 15 minute checks were done for safety.   R:Pt attends groups and interacts well with peers and staff. Pt is taking medication. Pt has no complaints at this time.Pt receptive to treatment and safety maintained on unit.

## 2013-08-11 NOTE — [facility] Group Notes (Signed)
Adult Psychoeducational Group Note  Date:  08/11/2013 Time:  9:36 PM  Group Topic/Focus:  Wrap-Up Group:   The focus of this group is to help patients review their daily goal of treatment and discuss progress on daily workbooks.  Participation Level:  Minimal  Participation Quality:  Appropriate  Affect:  Appropriate  Cognitive:  Appropriate  Insight: Good  Engagement in Group:  Engaged  Modes of Intervention:  Discussion  Additional Comments:  [name] stated his day was good.  He is discharging on Thursday and plans to attend Singleton birthday party for his niece.  He also said his medication has been making him dizzy here and there.  [name], [name] 08/11/2013, 9:36 PM

## 2013-08-11 NOTE — Progress Notes (Signed)
Adult Art Group  Date:  08/11/2013 Time:  9:30 AM  Group Topic/Focus: Art  Participation Level:  Active  Participation Quality:  Appropriate  Affect:  Appropriate  Activity: Patients asked to create art to coincide with daily unit theme using any combination of markers, crayons, color pencils, construction paper, magazine clippings, glue, and scissors.   Additional Comments:  Pts talked about different aspects of hygiene and how it relates to recovery. Pts used pictures from magazines to identify different parts of hygiene and used them to make a collage.  [name] 08/11/2013, 2:49 PM 

## 2013-08-11 NOTE — Progress Notes (Signed)
Patient ID: [name], male   DOB: 1990/08/21, 21 y.o.   MRN: [id number]  [facility]  08/11/2013 10:57 AM [name]  MRN:  [id number] Subjective: Patient reports: '' I slept better last night and I have been feeling less irritable or agitated.''  Objective: Patient is seen and his chart is reviewed with the members of the treatment team. Patient is endorsing decreased mood swings, irritability, racing thoughts and auditory/visual hallucinations. However, he remains somatic, paranoid and his behavior can be unpredictable. He continues to fixate on people trying to poison his food and saying that the police are out to get him. He has been attending the unit milieu, compliant with his medications and has not endorsed any adverse reactions. Diagnosis:   DSM5: Schizophrenia Disorders:  Delusional Disorder (297.1) and  Psychotic Disorder (298.8) Obsessive-Compulsive Disorders:   Trauma-Stressor Disorders:   Substance/Addictive Disorders:   Depressive Disorders:  Disruptive Mood Dysregulation Disorder (296.99) Total Time spent with patient: 20 minutes  Axis I: Schizoaffective disorder bipolar type  Axis II: Cluster B Traits Axis III:  Past Medical History  Diagnosis Date  . Asthma    Axis IV: other psychosocial or environmental problems and problems related to social environment  ADL's:  Intact  Sleep: Fair  Appetite:  Fair  Suicidal Ideation: yes Plan:  denies Intent:  denies Means:  denies Homicidal Ideation:   AEB (as evidenced by):  Psychiatric Specialty Exam: Physical Exam  Psychiatric: His speech is normal. His mood appears anxious. He is actively hallucinating. Thought content is paranoid. Cognition and memory are normal. He expresses impulsivity. He exhibits a depressed mood.    Review of Systems  Constitutional: Negative.   HENT: Negative.   Eyes: Negative.   Respiratory: Negative.   Cardiovascular: Negative.   Gastrointestinal: Negative.    Genitourinary: Negative.   Musculoskeletal: Negative.   Skin: Negative.   Neurological: Negative.   Endo/Heme/Allergies: Negative.   Psychiatric/Behavioral: Positive for depression and hallucinations. The patient is nervous/anxious.     Blood pressure 107/74, pulse 73, temperature 97.9 F (36.6 C), temperature source Oral, resp. rate 16, height 5\' 11"  (1.803 m), weight 112.946 kg (249 lb), SpO2 100.00%.Body mass index is 34.74 kg/(m^2).  General Appearance: Casual  Eye Contact::  Good  Speech:  Pressured  Volume:  Increased  Mood:  Angry and Irritable  Affect:  Labile  Thought Process:  Circumstantial and Disorganized  Orientation:  Full (Time, Place, and Person)  Thought Content:  Delusions and Hallucinations: Auditory  Suicidal Thoughts:  Yes.  without intent/plan  Homicidal Thoughts:  No  Memory:  Immediate;   Fair Recent;   Fair Remote;   Fair  Judgement:  Impaired  Insight:  Shallow  Psychomotor Activity:  Increased  Concentration:  Fair  Recall:  [employer]  Fund of Knowledge:Fair  Language: Good  Akathisia:  No  Handed:  Right  AIMS (if indicated):     Assets:  Communication Skills Desire for Improvement Physical Health  Sleep:  Number of Hours: 6.75   Musculoskeletal: Strength & Muscle Tone: within normal limits Gait & Station: normal Patient leans: N/A  Current Medications: Current Facility-Administered Medications  Medication Dose Route Frequency Provider Last Rate Last Dose  . acetaminophen (TYLENOL) tablet 650 mg  650 mg Oral Q6H PRN Fransisca KaufmannLaura Davis, NP   650 mg at 08/11/13 0745  . alum & mag hydroxide-simeth (MAALOX/MYLANTA) 200-200-20 MG/5ML suspension 30 mL  30 mL Oral Q4H PRN Fransisca KaufmannLaura Davis, NP   30 mL at 08/05/13 0615  .  benztropine (COGENTIN) tablet 1 mg  1 mg Oral QHS Tashanti Dalporto   1 mg at 08/10/13 2124  . carbamazepine (TEGRETOL XR) 12 hr tablet 200 mg  200 mg Oral BID Teka Chanda   200 mg at 08/11/13 0737  . haloperidol (HALDOL) tablet 10 mg  10 mg  Oral QHS Tayten Bergdoll   10 mg at 08/10/13 2125  . ibuprofen (ADVIL,MOTRIN) tablet 800 mg  800 mg Oral Q8H PRN [name], PA-C   800 mg at 08/10/13 1152  . LORazepam (ATIVAN) tablet 1 mg  1 mg Oral Q6H PRN Fransisca KaufmannLaura Davis, NP   1 mg at 08/10/13 1530  . magnesium hydroxide (MILK OF MAGNESIA) suspension 30 mL  30 mL Oral Daily PRN Fransisca KaufmannLaura Davis, NP      . neomycin-bacitracin-polymyxin (NEOSPORIN) ointment   Topical Daily Fransisca KaufmannLaura Davis, NP   15 application at 08/10/13 0815  . OLANZapine zydis (ZYPREXA) disintegrating tablet 10 mg  10 mg Oral Q8H PRN Fransisca KaufmannLaura Davis, NP   10 mg at 08/09/13 1952  . pantoprazole (PROTONIX) EC tablet 40 mg  40 mg Oral QHS Shawntelle Ungar   40 mg at 08/10/13 2124  . polyvinyl alcohol (LIQUIFILM TEARS) 1.4 % ophthalmic solution 1 drop  1 drop Both Eyes PRN Fransisca KaufmannLaura Davis, NP   1 drop at 08/07/13 2000  . traZODone (DESYREL) tablet 150 mg  150 mg Oral QHS Rhys Lichty   150 mg at 08/10/13 2124    Lab Results:  No results found for this or any previous visit (from the past 48 hour(s)).  Physical Findings: AIMS: Facial and Oral Movements Muscles of Facial Expression: None, normal Lips and Perioral Area: None, normal Jaw: None, normal Tongue: None, normal,Extremity Movements Upper (arms, wrists, hands, fingers): None, normal Lower (legs, knees, ankles, toes): None, normal, Trunk Movements Neck, shoulders, hips: None, normal, Overall Severity Severity of abnormal movements (highest score from questions above): None, normal Incapacitation due to abnormal movements: None, normal Patient's awareness of abnormal movements (rate only patient's report): No Awareness, Dental Status Current problems with teeth and/or dentures?: No Does patient usually wear dentures?: No  CIWA:    COWS:     Treatment Plan Summary: Daily contact with patient to assess and evaluate symptoms and progress in treatment Medication management  Plan:1. Admit for crisis management and  stabilization. 2. Medication management to reduce current symptoms to base line and improve the  patient's overall level of functioning: -Continue Haldol 10mg  po Qhs for psychosis/delusions -Continue Trazodone  150mg  po Qhs for insomnia -Continue Cogentin 1mg  po qhs for EPS prevention. -Continue Zyprexa Zydis 10mg  po q8 as needed for Agitation/psychosis -Continue Tegretol XR 200mg  bid for mood stabilization. 3. Treat health problems as indicated. 4. Develop treatment plan to decrease risk of relapse upon discharge and the need for readmission. 5. Psycho-social education regarding relapse prevention and self care. 6. Health care follow up as needed for medical problems. 7. Tegretol level on 08/13/13.     Medical Decision Making Problem Points:  Established problem, improving (1), Review of last therapy session (1) and Review of psycho-social stressors (1) Data Points:  Order Aims Assessment (2) Review or order clinical lab tests (1) Review of medication regiment & side effects (2)  I certify that inpatient services furnished can reasonably be expected to improve the patient's condition.   Thedore MinsAkintayo, Maxamilian Amadon, MD 08/11/2013, 10:57 AM

## 2013-08-11 NOTE — Progress Notes (Signed)
D: Patient denies SI/HI and auditory and visual hallucinations. The patient has an anxious mood and affect. The patient is somatic and presents with multiple complaints throughout the day. The patient complained of headache, dizziness, pain, anxiety, and a stomach ache all within an hour. The patient was noticed to be calm and jovial when not interacting with staff after vocalizing complaints to RN.  A: Patient given emotional support from RN. Patient encouraged to come to staff with concerns and/or questions. Patient's medication routine continued. Patient's orders and plan of care reviewed. Patient's vital signs were reviewed and patient was given PRN medications as appropriate.   R: Patient remains cooperative. Patient continues to be somatic and remains incongruent with his statements (vocalizing pain, anxiety and then laughing in dayroom a moment later). Will continue to monitor patient q15 minutes for safety.

## 2013-08-11 NOTE — [facility] Group Notes (Signed)
[facility] LCSW Group Therapy  08/11/2013 , 1:21 PM   Type of Therapy:  Group Therapy  Participation Level:  Active  Participation Quality:  Attentive  Affect:  Appropriate  Cognitive:  Alert  Insight:  Improving  Engagement in Therapy:  Engaged  Modes of Intervention:  Discussion, Exploration and Socialization  Summary of Progress/Problems: Today's group focused on the term Diagnosis.  Participants were asked to define the term, and then pronounce whether it is a negative, positive or neutral term.  Was active throughout, other than the 2 times he went to use the bathroom.  He stated he would never relapse, but later talked about going to clubs and bars.  Other patients called him on this, and he sheepishly acknowledged the disparity.  He also talked about relapse by not taking meds "because my dreams tell me not to take them."  Daryel Geraldorth, Rodney B 08/11/2013 , 1:21 PM

## 2013-08-12 DIAGNOSIS — F259 Schizoaffective disorder, unspecified: Principal | ICD-10-CM

## 2013-08-12 NOTE — [facility] Group Notes (Signed)
[facility]   08/12/2013 10:22 AM  Participation Quality:  Did not attend    [name], [name] B

## 2013-08-12 NOTE — Progress Notes (Signed)
Adult Activity Group Note   Group Topic: Art  Participation Level: Minimal  Participation Quality: Inattentive  Affect:  Flat  Activity: Patients asked to create art to coincide with daily unit theme using any combination of markers, crayons, color pencils, construction paper, magazine clippings, glue, and scissors.   Additional Comments: Pt came late to group but did write a leaf describing what he likes about summer: beach and fun.

## 2013-08-12 NOTE — Progress Notes (Signed)
Patient ID: [name], male   DOB: 09-20-1990, 23 y.o.   MRN: [id number] D: Patient is somatic in behavior. Complaining of headache, but refusing medicine, complaining of dizziness etc. Patient is redirectable. Patient denied homicidal and suicidal thoughts.  A: Patient given support and encouragement.  R: Patient compliant with medications and treatment plan.

## 2013-08-12 NOTE — Progress Notes (Signed)
Patient ID: [name], male   DOB: May 02, 1990, 23 y.o.   MRN: [id number]  [facility]  08/12/2013 11:20 AM [name]  MRN:  [id number] Subjective: Patient reports "I'm excited about going home tomorrow. I'm better because of the medicine. My mood is more stable. My mother will help me take my medication."   Objective:  Patient is visible on on the unit. Patient is endorsing decreased mood swings, irritability, racing thoughts and auditory/visual hallucinations. He remains compliant with his medications and denies adverse effects. Patient remains somatically focused but is easily redirected to other topics. He continues to express some delusions about following the directions that he receives in dreams.  Nursing staff have documented his calm demeanor after expressing multiple somatic complaints. Patient has been attending groups and he has been attentive in his participation quality. He reports an intention to comply with medications after discharging acknowledging his behavior problems when not taking them.   Diagnosis:   DSM5: Schizophrenia Disorders:  Delusional Disorder (297.1) and  Psychotic Disorder (298.8) Obsessive-Compulsive Disorders:   Trauma-Stressor Disorders:   Substance/Addictive Disorders:   Depressive Disorders:  Disruptive Mood Dysregulation Disorder (296.99) Total Time spent with patient: 15 minutes  Axis I: Schizoaffective disorder bipolar type  Axis II: Cluster B Traits Axis III:  Past Medical History  Diagnosis Date  . Asthma    Axis IV: other psychosocial or environmental problems and problems related to social environment  ADL's:  Intact  Sleep: Fair  Appetite:  Fair  Suicidal Ideation:  Denies Homicidal Ideation:  Denies AEB (as evidenced by):  Psychiatric Specialty Exam: Physical Exam  Psychiatric: His speech is normal. His mood appears anxious. Cognition and memory are normal. He expresses impulsivity.    Review of Systems   Constitutional: Negative.   HENT: Negative.   Eyes: Negative.   Respiratory: Negative.   Cardiovascular: Negative.   Gastrointestinal: Negative.   Genitourinary: Negative.   Musculoskeletal: Negative.   Skin: Negative.   Neurological: Negative.   Endo/Heme/Allergies: Negative.   Psychiatric/Behavioral: Positive for depression and hallucinations. The patient is nervous/anxious.     Blood pressure 100/55, pulse 89, temperature 97 F (36.1 C), temperature source Oral, resp. rate 20, height 5\' 11"  (1.803 m), weight 112.946 kg (249 lb), SpO2 100.00%.Body mass index is 34.74 kg/(m^2).  General Appearance: Casual  Eye Contact::  Good  Speech:  Clear and Coherent  Volume:  Normal  Mood:  Euthymic  Affect:  Labile  Thought Process:  Circumstantial  Orientation:  Full (Time, Place, and Person)  Thought Content:  Delusions and Rumination  Suicidal Thoughts:  No  Homicidal Thoughts:  No  Memory:  Immediate;   Fair Recent;   Fair Remote;   Fair  Judgement:  Impaired  Insight:  Shallow  Psychomotor Activity:  Increased  Concentration:  Fair  Recall:  FiservFair  Fund of Knowledge:Fair  Language: Good  Akathisia:  No  Handed:  Right  AIMS (if indicated):     Assets:  Communication Skills Desire for Improvement Physical Health  Sleep:  Number of Hours: 6.75   Musculoskeletal: Strength & Muscle Tone: within normal limits Gait & Station: normal Patient leans: N/A  Current Medications: Current Facility-Administered Medications  Medication Dose Route Frequency Provider Last Rate Last Dose  . acetaminophen (TYLENOL) tablet 650 mg  650 mg Oral Q6H PRN Fransisca KaufmannLaura Davis, NP   650 mg at 08/11/13 0745  . alum & mag hydroxide-simeth (MAALOX/MYLANTA) 200-200-20 MG/5ML suspension 30 mL  30 mL Oral Q4H PRN  Fransisca KaufmannLaura Davis, NP   30 mL at 08/05/13 0615  . benztropine (COGENTIN) tablet 1 mg  1 mg Oral QHS Mojeed Akintayo   1 mg at 08/11/13 2104  . carbamazepine (TEGRETOL XR) 12 hr tablet 200 mg  200 mg Oral  BID Mojeed Akintayo   200 mg at 08/12/13 0741  . haloperidol (HALDOL) tablet 10 mg  10 mg Oral QHS Mojeed Akintayo   10 mg at 08/11/13 2103  . ibuprofen (ADVIL,MOTRIN) tablet 800 mg  800 mg Oral Q8H PRN [name], PA-C   800 mg at 08/10/13 1152  . LORazepam (ATIVAN) tablet 1 mg  1 mg Oral Q6H PRN Fransisca KaufmannLaura Davis, NP   1 mg at 08/10/13 1530  . magnesium hydroxide (MILK OF MAGNESIA) suspension 30 mL  30 mL Oral Daily PRN Fransisca KaufmannLaura Davis, NP   30 mL at 08/12/13 0603  . neomycin-bacitracin-polymyxin (NEOSPORIN) ointment   Topical Daily Fransisca KaufmannLaura Davis, NP   15 application at 08/10/13 0815  . OLANZapine zydis (ZYPREXA) disintegrating tablet 10 mg  10 mg Oral Q8H PRN Fransisca KaufmannLaura Davis, NP   10 mg at 08/09/13 1952  . pantoprazole (PROTONIX) EC tablet 40 mg  40 mg Oral QHS Mojeed Akintayo   40 mg at 08/11/13 2104  . polyvinyl alcohol (LIQUIFILM TEARS) 1.4 % ophthalmic solution 1 drop  1 drop Both Eyes PRN Fransisca KaufmannLaura Davis, NP   1 drop at 08/07/13 2000  . traZODone (DESYREL) tablet 150 mg  150 mg Oral QHS Mojeed Akintayo   150 mg at 08/11/13 2104    Lab Results:  No results found for this or any previous visit (from the past 48 hour(s)).  Physical Findings: AIMS: Facial and Oral Movements Muscles of Facial Expression: None, normal Lips and Perioral Area: None, normal Jaw: None, normal Tongue: None, normal,Extremity Movements Upper (arms, wrists, hands, fingers): None, normal Lower (legs, knees, ankles, toes): None, normal, Trunk Movements Neck, shoulders, hips: None, normal, Overall Severity Severity of abnormal movements (highest score from questions above): None, normal Incapacitation due to abnormal movements: None, normal Patient's awareness of abnormal movements (rate only patient's report): No Awareness, Dental Status Current problems with teeth and/or dentures?: No Does patient usually wear dentures?: No  CIWA:    COWS:     Treatment Plan Summary: Daily contact with patient to assess and evaluate  symptoms and progress in treatment Medication management  Plan:1. Continue crisis management and stabilization. 2. Medication management to reduce current symptoms to base line and improve the  patient's overall level of functioning: -Continue Haldol 10mg  po Qhs for psychosis/delusions -Continue Trazodone  150mg  po Qhs for insomnia -Continue Cogentin 1mg  po qhs for EPS prevention. -Continue Zyprexa Zydis 10mg  po q8 as needed for Agitation/psychosis -Continue Tegretol XR 200mg  bid for mood stabilization. 3. Treat health problems as indicated. 4. Develop treatment plan to decrease risk of relapse upon discharge and the need for readmission. 5. Psycho-social education regarding relapse prevention and self care. 6. Health care follow up as needed for medical problems. 7. Tegretol level on 08/13/13. Anticipate discharge tomorrow.   Medical Decision Making Problem Points:  Established problem, improving (1), Review of last therapy session (1) and Review of psycho-social stressors (1) Data Points:  Order Aims Assessment (2) Review or order clinical lab tests (1) Review of medication regiment & side effects (2)  I certify that inpatient services furnished can reasonably be expected to improve the patient's condition.   Fransisca KaufmannDAVIS, LAURA, NP-C 08/12/2013, 11:20 AM  Patient seen, evaluated and I  agree with notes by Nurse Practitioner. Corena Pilgrim, MD

## 2013-08-12 NOTE — Progress Notes (Signed)
Adult Activity Group Note   Date:08/12/2013    Time: 2:15pm  Group Topic: Increased Cognitive Functioning   Participation Level: Active   Participation Quality: Resistant   Affect: Slightly irratable, resistant to participate but did.  Activity: Trivia. Patients will be asked to commonalities on ordinary items that have no appeared direct connection.   Additional Comments:

## 2013-08-12 NOTE — Progress Notes (Signed)
D: Patient denies SI/HI and auditory and visual hallucinations. The patient has an anxious mood and affect. The patient remains somatic and has multiple complaints of anxiety, agitation, dizziness, stomach cramps, and headaches throughout the day. When observed within milieu, patient is jovial and upbeat with no signs of malaise.   A: Patient given emotional support from RN. Patient encouraged to come to staff with concerns and/or questions. Patient's medication routine continued. Patient's orders and plan of care reviewed.  R: Patient remains cooperative. Patient continues to be somatic and remains incongruent with his statements (vocalizing complaints and then appearing jovial and happy moments later). Will continue to monitor patient q15 minutes for safety.

## 2013-08-12 NOTE — [facility] Group Notes (Signed)
[facility]  08/12/2013 , 10:23 AM    Type of Therapy:  [employer] Presentation  Participation Level:  Active  Participation Quality:  Attentive  Affect:  Blunted  Cognitive:  Oriented  Insight:  Limited  Engagement in Therapy:  Engaged  Modes of Intervention:  Discussion, Education and Socialization  Summary of Progress/Problems:  Onalee HuaDavid from [employer] came to present his recovery story and play the guitar.  Slept for most of group.  Left, came back, and listened intently as Onalee HuaDavid played the guitar.  Daryel Geraldorth, Rodney B 08/12/2013 , 10:23 AM

## 2013-08-13 LAB — CARBAMAZEPINE LEVEL, TOTAL: Carbamazepine Lvl: 7.2 ug/mL (ref 4.0–12.0)

## 2013-08-13 MED ORDER — HALOPERIDOL 10 MG PO TABS: 10.0000 mg | ORAL_TABLET | Freq: Every day | ORAL | Status: DC

## 2013-08-13 MED ORDER — CARBAMAZEPINE ER 200 MG PO TB12: 200.0000 mg | ORAL_TABLET | Freq: Two times a day (BID) | ORAL | Status: DC

## 2013-08-13 MED ORDER — BENZTROPINE MESYLATE 1 MG PO TABS: 1.0000 mg | ORAL_TABLET | Freq: Every day | ORAL | Status: DC

## 2013-08-13 MED ORDER — TRAZODONE HCL 150 MG PO TABS: 150.0000 mg | ORAL_TABLET | Freq: Every day | ORAL | Status: DC

## 2013-08-13 NOTE — [facility] Suicide Risk Assessment (Signed)
   Demographic Factors:  Male, Low socioeconomic status and Unemployed  Total Time spent with patient: 20 minutes  Psychiatric Specialty Exam: Physical Exam  Psychiatric: He has a normal mood and affect. His speech is normal and behavior is normal. Judgment and thought content normal. Cognition and memory are normal.    Review of Systems  Constitutional: Negative.   HENT: Negative.   Eyes: Negative.   Respiratory: Negative.   Cardiovascular: Negative.   Gastrointestinal: Negative.   Genitourinary: Negative.   Musculoskeletal: Negative.   Skin: Negative.   Neurological: Negative.   Endo/Heme/Allergies: Negative.   Psychiatric/Behavioral: Negative.     Blood pressure 124/64, pulse 79, temperature 96.8 F (36 C), temperature source Oral, resp. rate 20, height 5\' 11"  (1.803 m), weight 112.946 kg (249 lb), SpO2 100.00%.Body mass index is 34.74 kg/(m^2).  General Appearance: Fairly Groomed  Patent attorneyye Contact::  Negative  Speech:  Clear and Coherent and Normal Rate  Volume:  Normal  Mood:  Euthymic  Affect:  Appropriate  Thought Process:  Negative  Orientation:  Full (Time, Place, and Person)  Thought Content:  Negative  Suicidal Thoughts:  No  Homicidal Thoughts:  No  Memory:  Immediate;   Poor Recent;   Fair Remote;   Fair  Judgement:  Fair  Insight:  Fair  Psychomotor Activity:  Normal  Concentration:  Fair  Recall:  FiservFair  Fund of Knowledge:Fair  Language: Fair  Akathisia:  No  Handed:  Right  AIMS (if indicated):     Assets:  Communication Skills Desire for Improvement Physical Health  Sleep:  Number of Hours: 6.75    Musculoskeletal: Strength & Muscle Tone: within normal limits Gait & Station: normal Patient leans: N/A   Mental Status Per Nursing Assessment::   On Admission:  Suicidal ideation indicated by patient;Self-harm thoughts;Self-harm behaviors  Current Mental Status by Physician: patient denies suicidal ideation, intent or plan  Loss  Factors: Financial problems/change in socioeconomic status  Historical Factors: poor impulse control  Risk Reduction Factors:   Sense of responsibility to family, Living with another person, especially a relative and Positive social support  Continued Clinical Symptoms:  Resolving paranoia, mood symptoms and psychosis  Cognitive Features That Contribute To Risk:  Closed-mindedness Polarized thinking    Suicide Risk:  Minimal: No identifiable suicidal ideation.  Patients presenting with no risk factors but with morbid ruminations; may be classified as minimal risk based on the severity of the depressive symptoms  Discharge Diagnoses:   AXIS I:  Schizoaffective disorder  AXIS II:  Cluster C Traits AXIS III:   Past Medical History  Diagnosis Date  . Asthma   . Bipolar 1 disorder   . ODD (oppositional defiant disorder)   . ADHD (attention deficit hyperactivity disorder)   . Schizophrenia    AXIS IV:  other psychosocial or environmental problems and problems related to social environment AXIS V:  61-70 mild symptoms  Plan Of Care/Follow-up recommendations:  Activity:  as tolerated Diet:  healthy Tests:  Tegretol level: 7.2 Other:  patient to keep his after care appointment  Is patient on multiple antipsychotic therapies at discharge:  No   Has Patient had three or more failed trials of antipsychotic monotherapy by history:  No  Recommended Plan for Multiple Antipsychotic Therapies: NA    Thedore MinsAkintayo, Mojeed, MD 08/13/2013, 9:13 AM

## 2013-08-13 NOTE — Discharge Summary (Signed)
Physician Discharge Summary Note  Patient:  [name] is an 23 y.o., male MRN:  [id number] DOB:  09-14-1990 Patient phone:  [phone] (home)  Patient address:   [address] [place] [id number],  Total Time spent with patient: 20 minutes  Date of Admission:  08/04/2013 Date of Discharge: 08/13/13  Reason for Admission:  Psychosis, Aggression  Discharge Diagnoses: Principal Problem:   Schizoaffective disorder   Psychiatric Specialty Exam: Physical Exam  Review of Systems  Constitutional: Negative.   HENT: Negative.   Eyes: Negative.   Respiratory: Negative.   Cardiovascular: Negative.   Gastrointestinal: Negative.   Genitourinary: Negative.   Musculoskeletal: Negative.   Skin: Negative.   Neurological: Negative.   Endo/Heme/Allergies: Negative.   Psychiatric/Behavioral: Negative.     Blood pressure 124/64, pulse 79, temperature 96.8 F (36 C), temperature source Oral, resp. rate 20, height 5\' 11"  (1.803 m), weight 112.946 kg (249 lb), SpO2 100.00%.Body mass index is 34.74 kg/(m^2).  See Physician SRA                                                  Past Psychiatric History: See H&P Diagnosis:  Hospitalizations:  Outpatient Care:  Substance Abuse Care:  Self-Mutilation:  Suicidal Attempts:  Violent Behaviors:   Musculoskeletal: Strength & Muscle Tone: within normal limits Gait & Station: normal Patient leans: N/A  DSM5:  AXIS I: Schizoaffective disorder  AXIS II: Cluster C Traits  AXIS III:  Past Medical History   Diagnosis  Date   .  Asthma    .  Bipolar 1 disorder    .  ODD (oppositional defiant disorder)    .  ADHD (attention deficit hyperactivity disorder)    .  Schizophrenia     AXIS IV: other psychosocial or environmental problems and problems related to social environment  AXIS V: 61-70 mild symptoms   Level of Care:  OP  Hospital Course:  [name] is a 23 year old male [facility] of [facility] thoughts and command auditory hallucinations. He was recently in the ED [facility] Patient did not go there but instead made cuts to his left wrist. He was also having thoughts of blowing up a school or shooting people. The patient reports seeing and hearing people tell him to do bad things. Patient states today during his psychiatric admission assessment "I tried to kill myself. People tell me to do that. I hear voices. I see them. There is an Hispanic guy named [name] and a girl named Field seismologistKayla. I have been experiencing them since I was [age]. I'm just used to it. I feel depressed sometimes. I get aggressive when I'm off my medications. I was biting my hand and acting up." The patient was cooperative with his admission assessment. Notes from the ED indicate that patient was hearing voices telling him to kill other and that he tried to cut his wrist with an aluminum container of applesauce. Erland indicates the desire to take his medications orally and is not open to the idea of a long acting injectable at this time.          [name] was admitted to the adult 400 unit. He was evaluated and his symptoms were identified. Medication management was discussed and initiated. Patient was started on  Tegretol XR 200 mg BID for improved mood stability, Haldol 10 mg hs for psychosis, Cogentin 1 mg hs for EPS prevention, and Trazodone 150 mg hs insomnia. He was oriented to the unit and encouraged to participate in unit programming. Medical problems were identified and treated appropriately. Home medication was restarted as needed.        The patient was evaluated each day by a clinical provider to ascertain the patient's response to treatment. The patient at first was very somatically focused as he complained of constant abdominal pain. However, he never appeared to be in any acute distress.  Improvement was noted by the patient's report of decreasing symptoms,  improved sleep and appetite, affect, medication tolerance, behavior, and participation in unit programming.  He was asked each day to complete a self inventory noting mood, mental status, pain, new symptoms, anxiety and concerns.         He responded well to medication and being in a therapeutic and supportive environment. Positive and appropriate behavior was noted and the patient was motivated for recovery. The patient worked closely with the treatment team and case manager to develop a discharge plan with appropriate goals. Coping skills, problem solving as well as relaxation therapies were also part of the unit programming.         By the day of discharge he was in much improved condition than upon admission. Symptoms were reported as significantly decreased or resolved completely.  The patient denied SI/HI and voiced no AVH. He was motivated to continue taking medication with a goal of continued improvement in mental health.  Jery Freas was discharged home with a plan to follow up as noted below.  Consults:  None  Significant Diagnostic Studies:  Routine admission labs, Tegretol level, UDS negative   Discharge Vitals:   Blood pressure 124/64, pulse 79, temperature 96.8 F (36 C), temperature source Oral, resp. rate 20, height 5\' 11"  (1.803 m), weight 112.946 kg (249 lb), SpO2 100.00%. Body mass index is 34.74 kg/(m^2). Lab Results:   Results for orders placed during the hospital encounter of 08/04/13 (from the past 72 hour(s))  CARBAMAZEPINE LEVEL, TOTAL     Status: None   Collection Time    08/13/13  6:14 AM      Result Value Ref Range   Carbamazepine Lvl 7.2  4.0 - 12.0 ug/mL   Comment: Performed at [facility]    Physical Findings: AIMS: Facial and Oral Movements Muscles of Facial Expression: None, normal Lips and Perioral Area: None, normal Jaw: None, normal Tongue: None, normal,Extremity Movements Upper (arms, wrists, hands, fingers): None, normal Lower (legs,  knees, ankles, toes): None, normal, Trunk Movements Neck, shoulders, hips: None, normal, Overall Severity Severity of abnormal movements (highest score from questions above): None, normal Incapacitation due to abnormal movements: None, normal Patient's awareness of abnormal movements (rate only patient's report): No Awareness, Dental Status Current problems with teeth and/or dentures?: No Does patient usually wear dentures?: No  CIWA:    COWS:     Psychiatric Specialty Exam: See Psychiatric Specialty Exam and Suicide Risk Assessment completed by Attending Physician prior to discharge.  Discharge destination:  Home  Is patient on multiple antipsychotic therapies at discharge:  No   Has Patient had three or more failed trials of antipsychotic monotherapy by history:  No  Recommended Plan for Multiple Antipsychotic Therapies: NA     Medication List    STOP taking these medications       ARIPiprazole 15 MG  tablet  Commonly known as:  ABILIFY      TAKE these medications     Indication   benztropine 1 MG tablet  Commonly known as:  COGENTIN  Take 1 tablet (1 mg total) by mouth at bedtime.   Indication:  Extrapyramidal Reaction caused by Medications     carbamazepine 200 MG 12 hr tablet  Commonly known as:  TEGRETOL XR  Take 1 tablet (200 mg total) by mouth 2 (two) times daily.   Indication:  Manic-Depression     haloperidol 10 MG tablet  Commonly known as:  HALDOL  Take 1 tablet (10 mg total) by mouth at bedtime.   Indication:  Psychosis, Schizophrenia, Severe Problems with Behavior     traZODone 150 MG tablet  Commonly known as:  DESYREL  Take 1 tablet (150 mg total) by mouth at bedtime.   Indication:  Trouble Sleeping           Follow-up Information   Follow up with [facility]. (Go to the walk-in clinic M-F between 8 and 9AM for your hospital follow up appointment)    Contact information:   [address]  [place]  [phone]      Follow-up  recommendations:   Activity: as tolerated  Diet: healthy  Tests: Tegretol level: 7.2  Other: patient to keep his after care appointment   Comments:   Take all your medications as prescribed by your mental healthcare provider.  Report any adverse effects and or reactions from your medicines to your outpatient provider promptly.  Patient is instructed and cautioned to not engage in alcohol and or illegal drug use while on prescription medicines.  In the event of worsening symptoms, patient is instructed to call the crisis hotline, 911 and or go to the nearest ED for appropriate evaluation and treatment of symptoms.  Follow-up with your primary care provider for your other medical issues, concerns and or health care needs.   Total Discharge Time:  Greater than 30 minutes.  SignedFransisca Kaufmann: DAVIS, LAURA  NP-C  08/13/2013, 9:16 AM  Patient seen, evaluated and I agree with notes by Nurse Practitioner. Thedore MinsMojeed Evamaria Detore, MD

## 2013-08-13 NOTE — Tx Team (Signed)
  Interdisciplinary Treatment Plan Update   Date Reviewed:  08/13/2013  Time Reviewed:  11:07 AM  Progress in Treatment:   Attending groups: Yes Participating in groups: Yes Taking medication as prescribed: Yes  Tolerating medication: Yes Family/Significant other contact made: Yes  Patient understands diagnosis: Yes  Discussing patient identified problems/goals with staff: Yes Medical problems stabilized or resolved: Yes Denies suicidal/homicidal ideation: Yes Patient has not harmed self or others: Yes  For review of initial/current patient goals, please see plan of care.  Estimated Length of Stay:  D/C today  Reason for Continuation of Hospitalization:   New Problems/Goals identified:  N/A  Discharge Plan or Barriers:   return home, follow up outpt  Additional Comments:  Attendees:  Signature: Thedore MinsMojeed Akintayo, MD 08/13/2013 11:07 AM   Signature: Richelle Itood North, LCSW 08/13/2013 11:07 AM  Signature: Fransisca KaufmannLaura Davis, NP 08/13/2013 11:07 AM  Signature: Joslyn Devonaroline Beaudry, RN 08/13/2013 11:07 AM  Signature: Liborio NixonPatrice White, RN 08/13/2013 11:07 AM  Signature:  08/13/2013 11:07 AM  Signature:   08/13/2013 11:07 AM  Signature:    Signature:    Signature:    Signature:    Signature:    Signature:      Scribe for Treatment Team:   Richelle Itood North, LCSW  08/13/2013 11:07 AM

## 2013-08-13 NOTE — [facility] Group Notes (Signed)
[facility] Group Notes:  (Nursing/MHT/Case Management/Adjunct)  Date:  08/13/2013  Time:  9:42 AM  Type of Therapy:  Nurse Education  Participation Level:  None  Participation Quality:  Inattentive  Affect:  Flat  Cognitive:  Oriented  Insight:  None  Engagement in Group:  Poor  Modes of Intervention:  Discussion  Summary of Progress/Problems: Patient attended group but had his eyes closed. Patient did not speak during group.  Marzetta BoardDopson, Christa E 08/13/2013, 9:42 AM

## 2013-08-13 NOTE — Progress Notes (Signed)
Adult Activity Group Note   Group Topic: Art  Participation Level: Minimal  Participation Quality: Drowsy, Inattentive and Redirectable  Affect:  Flat and Resistant  Activity: Patients asked to create art to coincide with daily unit theme using any combination of markers, crayons, color pencils, construction paper, magazine clippings, glue, and scissors.   Additional Comments: Pt drew a self-portrait quickly, a stick figure. He said he is going home today.

## 2013-08-13 NOTE — Progress Notes (Signed)
Patient ID: [name], male   DOB: December 08, 1990, 23 y.o.   MRN: [id number] D: Took over patient's care @ 2330. Patient in bed sleeping. Respiration regular and unlabored. No sign of distress noted at this time A: 15 mins checks for safety. R: Patient is safe.

## 2013-08-13 NOTE — Discharge Summary (Signed)
Pt went home with mother, belongings returned, d/c instructions reviewed with mother and patient

## 2013-08-13 NOTE — Progress Notes (Signed)
Patient ID: [name], male   DOB: 02-Jun-1990, 23 y.o.   MRN: [id number]  D: Pt. Denies SI/HI and A/V Hallucinations. Patient reported feeling pain in abdomen but refused intervention. Patient is observed in the milieu talking to other patients and staff with no distress.   A: Support and encouragement provided to the patient. Scheduled medications administered to patient per physician's orders.  R: Patient is receptive and cooperative but intrusive at times. Patient reports that he feels ready to go home today. Q15 minute checks are maintained for safety.

## 2013-08-13 NOTE — Progress Notes (Signed)
[facility] Adult Case [facility] Discharge Plan :  Will you be returning to the same living situation after discharge: Yes,  home At discharge, do you have transportation home?:Yes,  family Do you have the ability to pay for your medications:Yes,  Mental Health  Release of information consent forms completed and in the chart;  Patient's signature needed at discharge.  Patient to Follow up at: Follow-up Information   Follow up with [facility]. (Go to the walk-in clinic M-F between 8 and 9AM for your hospital follow up appointment)    Contact information:   [address]  [place]  [phone]      Patient denies SI/HI:   Yes,  yes    Safety Planning and Suicide Prevention discussed:  Yes,  yes  Daryel Geraldorth, Rodney B 08/13/2013, 11:07 AM

## 2013-08-18 ENCOUNTER — Encounter (HOSPITAL_COMMUNITY): Payer: Self-pay | Admitting: Emergency Medicine

## 2013-08-18 ENCOUNTER — Emergency Department (EMERGENCY_DEPARTMENT_HOSPITAL)
Admission: EM | Admit: 2013-08-18 | Discharge: 2013-08-19 | Disposition: A | Discharge status: Home / Self Care | Payer: Medicaid Other | Source: Home / Self Care | Attending: Emergency Medicine | Admitting: Emergency Medicine

## 2013-08-18 DIAGNOSIS — F319 Bipolar disorder, unspecified: Secondary | ICD-10-CM | POA: Insufficient documentation

## 2013-08-18 DIAGNOSIS — F209 Schizophrenia, unspecified: Secondary | ICD-10-CM | POA: Insufficient documentation

## 2013-08-18 DIAGNOSIS — R45851 Suicidal ideations: Secondary | ICD-10-CM | POA: Diagnosis not present

## 2013-08-18 DIAGNOSIS — R443 Hallucinations, unspecified: Secondary | ICD-10-CM | POA: Insufficient documentation

## 2013-08-18 DIAGNOSIS — F909 Attention-deficit hyperactivity disorder, unspecified type: Secondary | ICD-10-CM

## 2013-08-18 DIAGNOSIS — I2510 Atherosclerotic heart disease of native coronary artery without angina pectoris: Secondary | ICD-10-CM | POA: Diagnosis not present

## 2013-08-18 DIAGNOSIS — F259 Schizoaffective disorder, unspecified: Secondary | ICD-10-CM

## 2013-08-18 DIAGNOSIS — F913 Oppositional defiant disorder: Secondary | ICD-10-CM

## 2013-08-18 DIAGNOSIS — J45909 Unspecified asthma, uncomplicated: Secondary | ICD-10-CM

## 2013-08-18 DIAGNOSIS — Z008 Encounter for other general examination: Secondary | ICD-10-CM | POA: Diagnosis present

## 2013-08-18 DIAGNOSIS — Z79899 Other long term (current) drug therapy: Secondary | ICD-10-CM | POA: Diagnosis not present

## 2013-08-18 LAB — COMPREHENSIVE METABOLIC PANEL
ALK PHOS: 68 U/L (ref 39–117)
ALT: 32 U/L (ref 0–53)
AST: 28 U/L (ref 0–37)
Albumin: 4.1 g/dL (ref 3.5–5.2)
BILIRUBIN TOTAL: 0.2 mg/dL — AB (ref 0.3–1.2)
BUN: 8 mg/dL (ref 6–23)
CHLORIDE: 102 meq/L (ref 96–112)
CO2: 28 meq/L (ref 19–32)
Calcium: 9.7 mg/dL (ref 8.4–10.5)
Creatinine, Ser: 0.93 mg/dL (ref 0.50–1.35)
GFR calc non Af Amer: >90 mL/min (ref >90)
GLUCOSE: 96 mg/dL (ref 70–99)
POTASSIUM: 4.6 meq/L (ref 3.7–5.3)
SODIUM: 142 meq/L (ref 137–147)
Total Protein: 7.8 g/dL (ref 6.0–8.3)

## 2013-08-18 LAB — RAPID URINE DRUG SCREEN, HOSP PERFORMED
Amphetamines: NOT DETECTED
Barbiturates: NOT DETECTED
Benzodiazepines: NOT DETECTED
Cocaine: NOT DETECTED
Opiates: NOT DETECTED
Tetrahydrocannabinol: NOT DETECTED

## 2013-08-18 LAB — CBC
HEMATOCRIT: 43.5 % (ref 39.0–52.0)
HEMOGLOBIN: 14.9 g/dL (ref 13.0–17.0)
MCH: 27.5 pg (ref 26.0–34.0)
MCHC: 34.3 g/dL (ref 30.0–36.0)
MCV: 80.3 fL (ref 78.0–100.0)
Platelets: 284 10*3/uL (ref 150–400)
RBC: 5.42 MIL/uL (ref 4.22–5.81)
RDW: 12.8 % (ref 11.5–15.5)
WBC: 3.3 10*3/uL — AB (ref 4.0–10.5)

## 2013-08-18 LAB — SALICYLATE LEVEL: Salicylate Lvl: <2 mg/dL — ABNORMAL LOW (ref 2.8–20.0)

## 2013-08-18 LAB — ACETAMINOPHEN LEVEL: Acetaminophen (Tylenol), Serum: <15 ug/mL (ref 10–30)

## 2013-08-18 LAB — ETHANOL: Alcohol, Ethyl (B): <11 mg/dL (ref 0–11)

## 2013-08-18 MED ORDER — LORAZEPAM 1 MG PO TABS
2.0000 mg | ORAL_TABLET | Freq: Four times a day (QID) | ORAL | Status: DC | PRN
  Administered 2013-08-18 – 2013-08-19 (×2): 2 mg via ORAL
  Filled 2013-08-18 (×2): qty 2

## 2013-08-18 MED ORDER — ONDANSETRON HCL 4 MG PO TABS: 4.0000 mg | ORAL_TABLET | Freq: Three times a day (TID) | ORAL | Status: DC | PRN

## 2013-08-18 MED ORDER — CARBAMAZEPINE ER 200 MG PO TB12
200.0000 mg | ORAL_TABLET | Freq: Two times a day (BID) | ORAL | Status: DC
  Administered 2013-08-19: 200 mg via ORAL
  Filled 2013-08-18 (×4): qty 1

## 2013-08-18 MED ORDER — BENZTROPINE MESYLATE 1 MG PO TABS
1.0000 mg | ORAL_TABLET | Freq: Every day | ORAL | Status: DC
  Administered 2013-08-18: 1 mg via ORAL
  Filled 2013-08-18: qty 1

## 2013-08-18 MED ORDER — ZOLPIDEM TARTRATE 5 MG PO TABS: 5.0000 mg | ORAL_TABLET | Freq: Every evening | ORAL | Status: DC | PRN

## 2013-08-18 MED ORDER — HALOPERIDOL 5 MG PO TABS
10.0000 mg | ORAL_TABLET | Freq: Every day | ORAL | Status: DC
  Administered 2013-08-18: 10 mg via ORAL
  Filled 2013-08-18: qty 2

## 2013-08-18 MED ORDER — LORAZEPAM 1 MG PO TABS
ORAL_TABLET | ORAL | Status: AC
Start: 2013-08-18 — End: 2013-08-19
  Filled 2013-08-18: qty 2

## 2013-08-18 MED ORDER — TRAZODONE HCL 100 MG PO TABS
150.0000 mg | ORAL_TABLET | Freq: Every day | ORAL | Status: DC
  Administered 2013-08-18: 150 mg via ORAL
  Filled 2013-08-18: qty 3

## 2013-08-18 MED ORDER — ALUM & MAG HYDROXIDE-SIMETH 200-200-20 MG/5ML PO SUSP: 30.0000 mL | ORAL | Status: DC | PRN

## 2013-08-18 MED ORDER — ACETAMINOPHEN 325 MG PO TABS
650.0000 mg | ORAL_TABLET | ORAL | Status: DC | PRN
  Administered 2013-08-19: 650 mg via ORAL
  Filled 2013-08-18: qty 2

## 2013-08-18 NOTE — ED Notes (Signed)
Pt states "I've been hearing voices since I was [age];" has been hearing voices telling him "to kill myself because then everything would be over." Pt reports wanting to cut his wrists but mother has stopped him twice today; scars noted to L wrist. States "voices started telling me to kill myself yesterday, but I haven't heard them say that today; they are still talking to me though." Pt calm and cooperative. Pt reports compliance with medications, states "it's useless to take medications because they don't work." When asked if medications have worked in the past, pt reports "they used to work, but my doctor took me off of the ones that work. I don't know why." Unable to recall medications

## 2013-08-18 NOTE — Progress Notes (Signed)
Patient Discharge Instructions:  After Visit Summary (AVS):   Faxed to:  08/18/13 Discharge Summary Note:   Faxed to:  08/18/13 Psychiatric Admission Assessment Note:   Faxed to:  08/18/13 Suicide Risk Assessment - Discharge Assessment:   Faxed to:  08/18/13 Faxed/Sent to the Next Level Care provider:  08/18/13 [facility] @ [phone]  Jerelene ReddenSheena E Pendleton, 08/18/2013, 2:06 PM

## 2013-08-18 NOTE — BH Assessment (Signed)
Clinician consulted with [name] SievertSpencer Simon, PA who recommends pt for inpatient admission. Per [name] EndoKelly, [facility] as pt will need to have own room. [facility] Jannifer [facility].  It is recommended that [facility] to be re-evaluated by psychiatry in the the AM. Clinician provided updates to Dr. Preston Singleton.   [name], MSW, LCSW Triage Specialist [phone]

## 2013-08-18 NOTE — ED Notes (Signed)
Pt states hearing voices that are telling him to bite his hands, states the voices are "[name] and Dorathy DaftKayla" and occasionally

## 2013-08-18 NOTE — ED Provider Notes (Signed)
I saw and evaluated the patient, reviewed the resident's note and I agree with the findings and plan.   .Face to face Exam:  General:  Awake HEENT:  Atraumatic Resp:  Normal effort Abd:  Nondistended Neuro:No focal weakness Lymph: No adenopathy   Nelia Shiobert L Harvest Stanco, MD 08/18/13 1537

## 2013-08-18 NOTE — ED Notes (Signed)
Pt requesting more ativan, states that the voices are coming back and he's trying not to listen to them, but it is very hard. RN notified
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
## 2013-08-18 NOTE — ED Provider Notes (Signed)
[facility] awaiting psych team disposition. Pt notes hx intermittent mid abd pain w heavy lifting in the past 1-2 months, no acute or abrupt change today.  No fevers. No nv. Normal appetite.  abd soft nt. Normal bs. No hernia.      Suzi RootsKevin E Emberlyn Burlison, MD 08/19/13 0001

## 2013-08-18 NOTE — ED Provider Notes (Signed)
CSN: [id number]     Arrival date & time 08/18/13  1304 History   First MD Initiated Contact with Patient 08/18/13 1338     Chief Complaint  Patient presents with  . Suicidal  . Paranoid     (Consider location/radiation/quality/duration/timing/severity/associated sxs/prior Treatment) HPI  Patient is a 23 year old male with a history of schizophrenia, bipolar disorder, ADD, and ADHD who presents complaining of auditory hallucinations and suicidal ideation. He has been hearing voices of 2 different people who tell him to hurt himself and to hurt others. He had planned to harm himself with a knife earlier today, but his mother took the knife away and made him come to the emergency room. He did not actually harm himself today. He says he has been compliant with his medicine over the last few weeks, but dislikes the medicine and wants to switch to something different. He notes that he's been dizzy since being on this medicine. The dizziness is that the spinning sensation, and a feeling of lightheadedness. He endorses some occasional abdominal pain which occurs when he is lifting weights. Also endorses having had some diarrhea over the last few days. He's been able to tolerate oral intake without any nausea or vomiting. He denies any chest pain or shortness of breath. He states that he drinks about a fifth of liquor approximately 4 times per month. His last drink was 8 days ago. He denies any drug use. He does not smoke cigarettes.  Past Medical History  Diagnosis Date  . Asthma   . Bipolar 1 disorder   . ODD (oppositional defiant disorder)   . ADHD (attention deficit hyperactivity disorder)   . Schizophrenia    History reviewed. No pertinent past surgical history. History reviewed. No pertinent family history. History  Substance Use Topics  . Smoking status: Never Smoker   . Smokeless tobacco: Not on file  . Alcohol Use: Yes    Review of Systems  Constitutional: Negative for fever.   Respiratory: Negative for shortness of breath.   Cardiovascular: Negative for chest pain.  Gastrointestinal: Positive for abdominal pain and diarrhea.  Neurological: Positive for dizziness and light-headedness.  Psychiatric/Behavioral: Positive for suicidal ideas, hallucinations and self-injury.  All other systems reviewed and are negative.   Allergies  Review of patient's allergies indicates no known allergies.  Home Medications   Prior to Admission medications   Medication Sig Start Date End Date Taking? Authorizing Provider  benztropine (COGENTIN) 1 MG tablet Take 1 tablet (1 mg total) by mouth at bedtime. 08/13/13   Fransisca KaufmannLaura Davis, NP  carbamazepine (TEGRETOL XR) 200 MG 12 hr tablet Take 1 tablet (200 mg total) by mouth 2 (two) times daily. 08/13/13   Fransisca KaufmannLaura Davis, NP  haloperidol (HALDOL) 10 MG tablet Take 1 tablet (10 mg total) by mouth at bedtime. 08/13/13   Fransisca KaufmannLaura Davis, NP  traZODone (DESYREL) 150 MG tablet Take 1 tablet (150 mg total) by mouth at bedtime. 08/13/13   Fransisca KaufmannLaura Davis, NP   BP 123/62  Pulse 82  Temp(Src) 98.3 F (36.8 C) (Oral)  Resp 20  Ht 5\' 10"  (1.778 m)  Wt 230 lb (104.327 kg)  BMI 33.00 kg/m2  SpO2 95% Physical Exam  Constitutional: He is oriented to person, place, and time. He appears well-developed and well-nourished. No distress.  HENT:  Head: Normocephalic and atraumatic.  Eyes: EOM are normal.  Cardiovascular: Normal rate, regular rhythm and normal heart sounds.   Pulmonary/Chest: Effort normal and breath sounds normal.  Abdominal: Soft. Bowel sounds  are normal. He exhibits no distension. There is no rebound and no guarding.  Very mild TTP midline.  Musculoskeletal: He exhibits no tenderness.  Neurological: He is alert and oriented to person, place, and time. He has normal strength. No cranial nerve deficit. Coordination normal.  Skin: Skin is warm and dry. He is not diaphoretic.  Psychiatric: His speech is normal. He is slowed and actively  hallucinating. He is not aggressive, not hyperactive and not combative. He expresses inappropriate judgment. He expresses homicidal and suicidal ideation. He expresses suicidal plans.    ED Course  Procedures (including critical care time) Labs Review Labs Reviewed  CBC - Abnormal; Notable for the following:    WBC 3.3 (*)    All other components within normal limits  COMPREHENSIVE METABOLIC PANEL - Abnormal; Notable for the following:    Total Bilirubin 0.2 (*)    All other components within normal limits  SALICYLATE LEVEL - Abnormal; Notable for the following:    Salicylate Lvl <2.0 (*)    All other components within normal limits  ACETAMINOPHEN LEVEL  ETHANOL  URINE RAPID DRUG SCREEN (HOSP PERFORMED)    Imaging Review No results found.   EKG Interpretation None      MDM   Final diagnoses:  Schizoaffective disorder, unspecified type  Suicidal ideation   23 year old male with history of schizophrenia and multiple other mental health diagnoses, presenting with auditory hallucinations and suicidal and homicidal ideation. He endorses mild abdominal pain, and his abdominal exam is benign. His lab work is unremarkable. His dizziness is most likely secondary to his psychiatric medications. Neurological exam is unremarkable. He is stable for psychiatric evaluation and likely placement.  Levert FeinsteinBrittany McIntyre, MD Family Medicine PGY-2   Latrelle DodrillBrittany J McIntyre, MD 08/18/13 [phone]

## 2013-08-18 NOTE — ED Notes (Signed)
He states hes been cutting himself at home to try to kill himself. He states his mother stopped him before he could cut too deep. He has scars to L wrist. He states hes been hearing voices telling him to hurt himself and others and they wont leave him alone. States his head hurts because the voices wont stop. He denies alcohol or drug use

## 2013-08-18 NOTE — ED Notes (Addendum)
Sitter states pt has been trying to choke self with sheets, removed sheets, removed call bell, pt also biting self on right hand, pt states "[name] tells me to do this and she will leave me alone" .Dr. Preston FleetingGlick notified-- orders received

## 2013-08-18 NOTE — ED Notes (Addendum)
Patient endorses SI, AVH at present. States that he hears and sees "[place] and [place]". States that he does not want to take his Tegretol because he feels that it is not working along with his other medications."I don't know why I took the others before". States that medications need adjusting or changing because he still hears the voices. Rates anxiety 10/10, depression 2/10. Reports pain 7/10 in abdomen off and on for one to two months. Request an exam by a doctor. Reports fall 6/22, dizzy in shower. Patient upset [facility]. [facility] because he is familiar with staff and environment.  Encouragement offered. Patient declines Tegretol at present. Dr. Denton LankSteinl made aware of patient request. Reminded of fall prevention precautions to take [facility].  Patient safety maintained, Q 15 checks in place.

## 2013-08-18 NOTE — ED Provider Notes (Signed)
TTS consultation appreciated.They [facility] where [facility] can be evaluated by psychiatrist in the morning. Case has been [facility] with [facility]Steinl [facility] in transfer.  Dione Boozeavid Glick, MD 08/18/13 2144

## 2013-08-19 ENCOUNTER — Encounter (HOSPITAL_COMMUNITY): Payer: Self-pay | Admitting: Emergency Medicine

## 2013-08-19 ENCOUNTER — Emergency Department (HOSPITAL_COMMUNITY)
Admission: EM | Admit: 2013-08-19 | Discharge: 2013-08-22 | Disposition: A | Discharge status: Other Acute Inpatient Hospital | Payer: Medicaid Other | Attending: Emergency Medicine | Admitting: Emergency Medicine

## 2013-08-19 ENCOUNTER — Encounter (HOSPITAL_COMMUNITY): Payer: Self-pay | Admitting: Psychiatry

## 2013-08-19 DIAGNOSIS — R45850 Homicidal ideations: Secondary | ICD-10-CM

## 2013-08-19 DIAGNOSIS — R45851 Suicidal ideations: Secondary | ICD-10-CM

## 2013-08-19 DIAGNOSIS — F250 Schizoaffective disorder, bipolar type: Secondary | ICD-10-CM

## 2013-08-19 DIAGNOSIS — F259 Schizoaffective disorder, unspecified: Secondary | ICD-10-CM | POA: Diagnosis present

## 2013-08-19 DIAGNOSIS — R4689 Other symptoms and signs involving appearance and behavior: Secondary | ICD-10-CM | POA: Diagnosis present

## 2013-08-19 DIAGNOSIS — R443 Hallucinations, unspecified: Secondary | ICD-10-CM

## 2013-08-19 LAB — CBC
HCT: 46.4 % (ref 39.0–52.0)
Hemoglobin: 15.8 g/dL (ref 13.0–17.0)
MCH: 27.4 pg (ref 26.0–34.0)
MCHC: 34.1 g/dL (ref 30.0–36.0)
MCV: 80.4 fL (ref 78.0–100.0)
PLATELETS: 307 10*3/uL (ref 150–400)
RBC: 5.77 MIL/uL (ref 4.22–5.81)
RDW: 12.9 % (ref 11.5–15.5)
WBC: 5.7 10*3/uL (ref 4.0–10.5)

## 2013-08-19 LAB — RAPID URINE DRUG SCREEN, HOSP PERFORMED
Amphetamines: NOT DETECTED
Barbiturates: NOT DETECTED
Benzodiazepines: NOT DETECTED
Cocaine: NOT DETECTED
OPIATES: NOT DETECTED
Tetrahydrocannabinol: NOT DETECTED
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
## 2013-08-19 NOTE — Progress Notes (Signed)
CSW spoke with [facility] who states that patient is being considered for admission although [facility]. No beds expected to be available today. CSW will make referrals to other facilities and continue to monitor bed [facility].   Samuella BruinKristin Drinkard, MSW, [facility] Clinical Social Worker [facility] [phone]

## 2013-08-19 NOTE — [facility] Suicide Risk Assessment (Signed)
Suicide Risk Assessment  Discharge Assessment     Demographic Factors:  Male and Adolescent or young adult  Total Time spent with patient: 45 minutes  Psychiatric Specialty Exam:     Blood pressure 109/71, pulse 79, temperature 98.5 F (36.9 C), temperature source Oral, resp. rate 20, height 5\' 10"  (1.778 m), weight 104.327 kg (230 lb), SpO2 100.00%.Body mass index is 33 kg/(m^2).  General Appearance: Well Groomed  Patent attorneyye Contact::  Good  Speech:  Clear and Coherent  Volume:  Normal  Mood:  Euthymic  Affect:  Appropriate  Thought Process:  Coherent  Orientation:  Full (Time, Place, and Person)  Thought Content:  Negative  Suicidal Thoughts:  No  Homicidal Thoughts:  No  Memory:  Immediate;   Good Recent;   Good Remote;   Good  Judgement:  Intact  Insight:  Fair  Psychomotor Activity:  Normal  Concentration:  Good  Recall:  Good  Fund of Knowledge:Good  Language: Good  Akathisia:  Negative  Handed:  Right  AIMS (if indicated):     Assets:  Engineer, maintenanceCommunication Skills Housing Physical Health Social Support  Sleep:       Musculoskeletal: Strength & Muscle Tone: within normal limits Gait & Station: normal Patient leans: N/A   Mental Status Per Nursing Assessment::   On Admission:     Current Mental Status by Physician: NA  Loss Factors: NA  Historical Factors: NA  Risk Reduction Factors:   NA  Continued Clinical Symptoms:  Schizophrenia:   Depressive state  Cognitive Features That Contribute To Risk:  Closed-mindedness    Suicide Risk:  Mild:  Suicidal ideation of limited frequency, intensity, duration, and specificity.  There are no identifiable plans, no associated intent, mild dysphoria and related symptoms, good self-control (both objective and subjective assessment), few other risk factors, and identifiable protective factors, including available and accessible social support.  Discharge Diagnoses:   AXIS I:  Schizoaffective Disorder AXIS II:   Deferred AXIS III:   Past Medical History  Diagnosis Date  . Asthma   . Bipolar 1 disorder   . ODD (oppositional defiant disorder)   . ADHD (attention deficit hyperactivity disorder)   . Schizophrenia    AXIS IV:  chronic mental illness AXIS V:  61-70 mild symptoms  Plan Of Care/Follow-up recommendations:  Activity:  resume usual activity Diet:  resume usual diet  Is patient on multiple antipsychotic therapies at discharge:  No   Has Patient had three or more failed trials of antipsychotic monotherapy by history:  No  Recommended Plan for Multiple Antipsychotic Therapies: NA    TAYLOR,GERALD D 08/19/2013, 2:39 PM

## 2013-08-19 NOTE — ED Notes (Signed)
Pt aaox3. Pt very anxious.  Staff just left patient room.  Pt denies SI/HI/AH/VH.  Pt reports "I am just feeling very anxious right now."  Will continue to monitor.

## 2013-08-19 NOTE — ED Notes (Addendum)
Pt has 2 bags of belongs and  2 cellphones.  Seen and wanded by security.
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
## 2013-08-19 NOTE — ED Notes (Addendum)
Pt anxious at this time.  Pt requesting ativan.  See MAR.  Pt requesting to leave and wants to speak with doctor.  Pt reports "the voices are telling me to stab myself.  Their names are kayla and joshua.  See look at my wrist.  I took a plastic fork and stab my wrist."  Pt pointed to right wrist.  Pt wrist wnl, skin intact no redness or bleeding noted.  Pt now requesting to leave.  Dr Ladona Ridgeltaylor made aware.  MD at bedside

## 2013-08-19 NOTE — ED Notes (Signed)
Pt brought in by EMS for medical clearance  Pt was just discharged today at 1500 from here  Pt states he is suicidal and is hearing voices and states his medication is not helping  Pt cut his left wrist with a paperclip very superficial  Pt rating his pain 8/10

## 2013-08-20 DIAGNOSIS — R443 Hallucinations, unspecified: Secondary | ICD-10-CM

## 2013-08-20 DIAGNOSIS — F329 Major depressive disorder, single episode, unspecified: Secondary | ICD-10-CM

## 2013-08-20 DIAGNOSIS — R45851 Suicidal ideations: Secondary | ICD-10-CM

## 2013-08-20 DIAGNOSIS — F3289 Other specified depressive episodes: Secondary | ICD-10-CM

## 2013-08-20 LAB — COMPREHENSIVE METABOLIC PANEL
ALT: 37 U/L (ref 0–53)
AST: 30 U/L (ref 0–37)
Albumin: 4.3 g/dL (ref 3.5–5.2)
Alkaline Phosphatase: 74 U/L (ref 39–117)
BUN: 12 mg/dL (ref 6–23)
CO2: 25 meq/L (ref 19–32)
Calcium: 9.5 mg/dL (ref 8.4–10.5)
Chloride: 96 mEq/L (ref 96–112)
Creatinine, Ser: 0.98 mg/dL (ref 0.50–1.35)
GFR calc Af Amer: >90 mL/min (ref >90)
Glucose, Bld: 87 mg/dL (ref 70–99)
Potassium: 4.1 mEq/L (ref 3.7–5.3)
SODIUM: 137 meq/L (ref 137–147)
TOTAL PROTEIN: 7.9 g/dL (ref 6.0–8.3)
Total Bilirubin: <0.2 mg/dL — ABNORMAL LOW (ref 0.3–1.2)

## 2013-08-20 LAB — SALICYLATE LEVEL: Salicylate Lvl: <2 mg/dL — ABNORMAL LOW (ref 2.8–20.0)

## 2013-08-20 LAB — ETHANOL

## 2013-08-20 LAB — ACETAMINOPHEN LEVEL

## 2013-08-20 MED ORDER — IBUPROFEN 200 MG PO TABS
600.0000 mg | ORAL_TABLET | Freq: Three times a day (TID) | ORAL | Status: DC
  Administered 2013-08-20 (×2): 600 mg via ORAL
  Filled 2013-08-20 (×3): qty 3

## 2013-08-20 MED ORDER — HALOPERIDOL LACTATE 5 MG/ML IJ SOLN: 5.0000 mg | Freq: Once | INTRAMUSCULAR | Status: DC

## 2013-08-20 MED ORDER — DIPHENHYDRAMINE HCL 50 MG/ML IJ SOLN: 50.0000 mg | Freq: Once | INTRAMUSCULAR | Status: DC

## 2013-08-20 MED ORDER — HALOPERIDOL LACTATE 5 MG/ML IJ SOLN
INTRAMUSCULAR | Status: AC
  Administered 2013-08-20: 5 mg via INTRAMUSCULAR
  Filled 2013-08-20: qty 1

## 2013-08-20 MED ORDER — BENZTROPINE MESYLATE 1 MG PO TABS
1.0000 mg | ORAL_TABLET | Freq: Every day | ORAL | Status: DC
  Administered 2013-08-20 – 2013-08-21 (×2): 1 mg via ORAL
  Filled 2013-08-20 (×2): qty 1

## 2013-08-20 MED ORDER — ACETAMINOPHEN 325 MG PO TABS
650.0000 mg | ORAL_TABLET | ORAL | Status: DC | PRN
  Administered 2013-08-20 – 2013-08-21 (×2): 650 mg via ORAL
  Filled 2013-08-20 (×2): qty 2

## 2013-08-20 MED ORDER — LORAZEPAM 2 MG/ML IJ SOLN
INTRAMUSCULAR | Status: AC
  Administered 2013-08-20: 2 mg via INTRAMUSCULAR
  Filled 2013-08-20: qty 1

## 2013-08-20 MED ORDER — LORAZEPAM 2 MG/ML IJ SOLN: 2.0000 mg | Freq: Once | INTRAMUSCULAR | Status: DC

## 2013-08-20 MED ORDER — NICOTINE 21 MG/24HR TD PT24: 21.0000 mg | MEDICATED_PATCH | Freq: Every day | TRANSDERMAL | Status: DC

## 2013-08-20 MED ORDER — IBUPROFEN 200 MG PO TABS
600.0000 mg | ORAL_TABLET | Freq: Three times a day (TID) | ORAL | Status: DC | PRN
  Administered 2013-08-20 – 2013-08-22 (×2): 600 mg via ORAL
  Filled 2013-08-20 (×2): qty 3

## 2013-08-20 MED ORDER — CARBAMAZEPINE ER 200 MG PO TB12
200.0000 mg | ORAL_TABLET | Freq: Two times a day (BID) | ORAL | Status: DC
  Administered 2013-08-20 – 2013-08-22 (×4): 200 mg via ORAL
  Filled 2013-08-20 (×5): qty 1

## 2013-08-20 MED ORDER — ONDANSETRON HCL 4 MG PO TABS: 4.0000 mg | ORAL_TABLET | Freq: Three times a day (TID) | ORAL | Status: DC | PRN

## 2013-08-20 MED ORDER — ALUM & MAG HYDROXIDE-SIMETH 200-200-20 MG/5ML PO SUSP
30.0000 mL | ORAL | Status: DC | PRN
  Administered 2013-08-22: 30 mL via ORAL
  Filled 2013-08-20: qty 30

## 2013-08-20 MED ORDER — HALOPERIDOL LACTATE 5 MG/ML IJ SOLN
5.0000 mg | Freq: Once | INTRAMUSCULAR | Status: AC
  Administered 2013-08-20: 5 mg via INTRAMUSCULAR
  Filled 2013-08-20: qty 1

## 2013-08-20 MED ORDER — TRAZODONE HCL 50 MG PO TABS: 150.0000 mg | ORAL_TABLET | Freq: Every day | ORAL | Status: DC

## 2013-08-20 MED ORDER — LORAZEPAM 1 MG PO TABS
1.0000 mg | ORAL_TABLET | Freq: Three times a day (TID) | ORAL | Status: DC | PRN
  Administered 2013-08-20 – 2013-08-21 (×2): 1 mg via ORAL
  Filled 2013-08-20 (×4): qty 1

## 2013-08-20 MED ORDER — ZIPRASIDONE MESYLATE 20 MG IM SOLR
10.0000 mg | Freq: Once | INTRAMUSCULAR | Status: AC
  Administered 2013-08-20: 10 mg via INTRAMUSCULAR
  Filled 2013-08-20: qty 20

## 2013-08-20 MED ORDER — ZOLPIDEM TARTRATE 5 MG PO TABS
5.0000 mg | ORAL_TABLET | Freq: Every evening | ORAL | Status: DC | PRN
  Administered 2013-08-20: 5 mg via ORAL
  Filled 2013-08-20: qty 1

## 2013-08-20 MED ORDER — TRAZODONE HCL 50 MG PO TABS
150.0000 mg | ORAL_TABLET | Freq: Every day | ORAL | Status: DC
  Administered 2013-08-21: 150 mg via ORAL
  Filled 2013-08-20 (×2): qty 1

## 2013-08-20 MED ORDER — DIPHENHYDRAMINE HCL 50 MG/ML IJ SOLN
INTRAMUSCULAR | Status: AC
  Administered 2013-08-20: 50 mg via INTRAMUSCULAR
  Filled 2013-08-20: qty 1

## 2013-08-20 MED ORDER — HALOPERIDOL 5 MG PO TABS
10.0000 mg | ORAL_TABLET | Freq: Every day | ORAL | Status: DC
  Administered 2013-08-20 – 2013-08-21 (×2): 10 mg via ORAL
  Filled 2013-08-20 (×2): qty 2

## 2013-08-20 NOTE — ED Notes (Signed)
Patient reports that he has the right to say he is going to hurt himself. States that he is upset to be under IVC. Reports that he is going to starve himself to death and will throw any food or drink brought to his room down the hall. Patient is attention seeking. States that he wants to be restrained. States that he harms himself because of the voices and for relief and pleasure.

## 2013-08-20 NOTE — ED Notes (Signed)
Patient gave Clinical research associatewriter a paperclip that he had hidden in his sock. Patient asked for a drink. States that he "changed his mind". Patient hopes to go home within three days".

## 2013-08-20 NOTE — Consult Note (Signed)
  Psychiatric Specialty Exam: Physical Exam  ROS  Blood pressure 113/69, pulse 84, temperature 97.3 F (36.3 C), temperature source Oral, resp. rate 18, height 5\' 10"  (1.778 m), weight 113.399 kg (250 lb), SpO2 98.00%.Body mass index is 35.87 kg/(m^2).  General Appearance: Well Groomed  Patent attorneyye Contact::  Good  Speech:  Clear and Coherent  Volume:  Normal  Mood:  Irritable  Affect:  Labile  Thought Process:  seems to be enjoying accusing staff of being rude to him and being disruptive  Orientation:  Full (Time, Place, and Person)  Thought Content:  claims he hears voices he has heard for years and has given them names  Suicidal Thoughts:  Yes.  without intent/plan  Homicidal Thoughts:  No  Memory:  Immediate;   Good Recent;   Good Remote;   Good  Judgement:  Poor  Insight:  Lacking  Psychomotor Activity:  Normal  Concentration:  Good  Recall:  Good  Akathisia:  Negative  Handed:  Right  AIMS (if indicated):     Assets:  ArchitectCommunication Skills Financial Resources/Insurance Housing Leisure Time Physical Health Social Support Transportation  Sleep:   good  Mr Laural BenesJohnson was just here yesterday and was not suicidal.  He is frequently suicidal saying the 2 voices he has heard and named tell him to do things such as to kill himself. When it seems convenient he no longer responds to these "voices".  His mother yesterday indicated this had been going on for years and seemed not to be too concerned as she goes through this with him all the time.  This time he made scratches on his arms once again with the same story.  He was very silly on the unit, accusing the staff of being rude to him while was being rude to them.  He was given an injection because he was threatening staff and loudly threatening in front of other psychotic patients who began to get agitated themselves.  Currently he is calm and cooperative.the plan is [facility] as no one else will take him.  He  likely will clear up before that when he decides he wants to go home.

## 2013-08-20 NOTE — ED Notes (Addendum)
Patient resting with eyes closed. Respirations even and unlabored. Passive ROM.

## 2013-08-20 NOTE — ED Notes (Signed)
Patient is arousable and moves extremities on command.

## 2013-08-20 NOTE — ED Notes (Signed)
Patient pacing and verbally aggressive.  Unable to redirect.  Verbal threats to this Clinical research associatewriter.  IM medication per MD verbal order.  Security and [employer] present.

## 2013-08-20 NOTE — ED Notes (Signed)
Informed MD of restaint removal and pt contracts for safety.  MD in to see patient.

## 2013-08-20 NOTE — ED Notes (Signed)
Patient is positive for SI. Reports passive AVH. Rates anxiety 6/10 and depression 3/10. Patient is restless. Asking if he is voluntary or IVC.  Encouragement offered, given Motrin and bedside table, and snack.  Patient safety maintained, Q 15 checks continue.

## 2013-08-20 NOTE — Progress Notes (Signed)
Dud to pt aggression, need for 4 point restraints due to aggression towards self and staff, patient recommended for [facility], priority. CSW completed referral and obtained auth# [id number] for 08/20/2013 to 08/26/2013.   Byrd HesselbachKristen Reed, LCSW [phone]  ED CSW 08/20/2013 1149am

## 2013-08-20 NOTE — ED Notes (Signed)
Patient has SI, AH. States that he is going to hurt himself. Patient scratching at right arm and biting at his hands. [facility]  Patient encouraged to rest. Bed searched. Patient states he will jump off the bed to bust his head/break his neck. Stretcher removed.   Patient threw mattress, trash, and slammed door as [employer] and [employer] left his room.  Security called.

## 2013-08-20 NOTE — ED Notes (Signed)
Removed soft restaint from left arm.  ROM done.  Patient verbalizes he is "calmer"

## 2013-08-20 NOTE — ED Notes (Signed)
Patient is standing up in his room, and is digging his nails into his skin. [employer] and 3 security officers are standing with him

## 2013-08-20 NOTE — ED Notes (Signed)
Patient in room banging his head on glass window in the door. Writer went asked patient to stop. Writer informed patient that staff could not let him hurt himself and that we would put him in 4 points restraints if we had too. Patient told nurse " No you won't put me in restraints." "I have not even heard of anything like that." Patient grab his head and writer asked patient if he felt dizzy and patient replied "yes.". Writer told patient he needed to lay now. Patient verbalized understanding. Patient also asked writer to wipe his scratches with alcohol pads. Writer got alcohol pads and clean patient wounds. Patient informed Clinical research associatewriter he was going to lay down and go to sleep. Patient laid down and went to sleep.

## 2013-08-20 NOTE — Consult Note (Signed)
  Review of Systems  Constitutional: Negative.   HENT: Negative.   Eyes: Negative.   Respiratory: Negative.   Cardiovascular: Negative.   Gastrointestinal: Negative.   Genitourinary: Negative.   Musculoskeletal: Negative.   Skin: Negative.   Neurological: Negative.   Endo/Heme/Allergies: Negative.   Psychiatric/Behavioral: Positive for depression and hallucinations.    

## 2013-08-20 NOTE — ED Notes (Signed)
All resrtraints removed.  Contracted for safety.  Ambulated to BR.  NAD.

## 2013-08-20 NOTE — ED Notes (Signed)
Removed soft restaint from right leg. ROM done. Patient in NAD.  Requesting urinal.

## 2013-08-20 NOTE — ED Notes (Signed)
Patient went to bathroom. When he went back to his room he was given his mattress and he started banging his head up against the glass part of the door.

## 2013-08-20 NOTE — ED Notes (Signed)
Patient continues to be verbally and physically threatning. Picking at open wound.  Refuses to cooperate with requests to stay in room. 4 point restraints initiated.  BP 121/87. Pulse 78. Denies pain.  Fluids offered.  Urinal provided.  Continue to verbally deescalate.

## 2013-08-20 NOTE — ED Notes (Addendum)
Patient given pair of gray/blue checked boxers from his duffle bag. Bag returned to [employer] closet. Used boxers place in plastic bag with gym shoes. Bags returned to [facility] [facility] hall closet.

## 2013-08-20 NOTE — ED Provider Notes (Signed)
CSN: [id number]     Arrival date & time 08/19/13  2227 History   First MD Initiated Contact with Patient 08/19/13 2312     Chief Complaint  Patient presents with  . Medical Clearance     (Consider location/radiation/quality/duration/timing/severity/associated sxs/prior Treatment) HPI Pt with xh of bipolar, ODD, ADHD, schizophrenia presents with c/o hearing voices and feeling suicidal.  He was discharged this morning from [facility] ED after evaluation by psychiatry. However, he states that the voices have gotten worse since leaving the ED. He also states that he did not tell anyone about the voices earlier today.  He has no specific plan, but states the voices are telling him to kill himself and that he needs to be [facility].  No fever/chills, no vomiting.  Denies substance abuse.  There are no other associated systemic symptoms, there are no other alleviating or modifying factors.   Past Medical History  Diagnosis Date  . Asthma   . Bipolar 1 disorder   . ODD (oppositional defiant disorder)   . ADHD (attention deficit hyperactivity disorder)   . Schizophrenia    History reviewed. No pertinent past surgical history. History reviewed. No pertinent family history. History  Substance Use Topics  . Smoking status: Never Smoker   . Smokeless tobacco: Not on file  . Alcohol Use: Yes    Review of Systems ROS reviewed and all otherwise negative except for mentioned in HPI    Allergies  Review of patient's allergies indicates no known allergies.  Home Medications   Prior to Admission medications   Medication Sig Start Date End Date Taking? Authorizing Provider  carbamazepine (TEGRETOL XR) 200 MG 12 hr tablet Take 1 tablet (200 mg total) by mouth 2 (two) times daily. 08/13/13  Yes Fransisca KaufmannLaura Davis, NP  benztropine (COGENTIN) 1 MG tablet Take 1 tablet (1 mg total) by mouth at bedtime. 08/13/13   Fransisca KaufmannLaura Davis, NP  haloperidol (HALDOL) 10 MG tablet Take 1  tablet (10 mg total) by mouth at bedtime. 08/13/13   Fransisca KaufmannLaura Davis, NP  traZODone (DESYREL) 150 MG tablet Take 1 tablet (150 mg total) by mouth at bedtime. 08/13/13   Fransisca KaufmannLaura Davis, NP   BP 111/76  Pulse 78  Temp(Src) 97.5 F (36.4 C) (Oral)  Resp 18  Ht 5\' 10"  (1.778 m)  Wt 250 lb (113.399 kg)  BMI 35.87 kg/m2  SpO2 97% Vitals reviewed Physical Exam Physical Examination: General appearance - alert, well appearing, and in no distress Mental status - alert, oriented to person, place, and time Eyes - pupils equal and reactive, extraocular eye movements intact Mouth - mucous membranes moist, pharynx normal without lesions Chest - clear to auscultation, no wheezes, rales or rhonchi, symmetric air entry Heart - normal rate, regular rhythm, normal S1, S2, no murmurs, rubs, clicks or gallops Abdomen - soft, nontender, nondistended, no masses or organomegaly Extremities - peripheral pulses normal, no pedal edema, no clubbing or cyanosis Skin - normal coloration and turgor, no rashes, no suspicious skin lesions noted Psych- flat affect, calm and cooperatiev  ED Course  Procedures (including critical care time)  1:16 AM  I have filled out IVC paperwork on patient as he is continuing to threaten to kill himself while back in the psych ED. Please see nursing notes He is unwilling to take medications voluntarily.  I have ordered geodon 10mg  IM as well  CRITICAL CARE Performed by: Ethelda ChickLINKER,MARTHA K  ?  Total critical care time: 35  Critical care  time was exclusive of separately billable procedures and treating other patients.  Critical care was necessary to treat or prevent imminent or life-threatening deterioration.  Critical care was time spent personally by me on the following activities: development of treatment plan with patient and/or surrogate as well as nursing, discussions with consultants, evaluation of patient's response to treatment, examination of patient, obtaining history from  patient or surrogate, ordering and performing treatments and interventions, ordering and review of laboratory studies, ordering and review of radiographic studies, pulse oximetry and re-evaluation of patient's condition. Labs Review Labs Reviewed  COMPREHENSIVE METABOLIC PANEL - Abnormal; Notable for the following:    Total Bilirubin <0.2 (*)    All other components within normal limits  SALICYLATE LEVEL - Abnormal; Notable for the following:    Salicylate Lvl <2.0 (*)    All other components within normal limits  ACETAMINOPHEN LEVEL  CBC  ETHANOL  URINE RAPID DRUG SCREEN (HOSP PERFORMED)    Imaging Review No results found.   EKG Interpretation None      MDM   Final diagnoses:  Suicidal ideation  Hallucination   Pt presenting with hallucinations and c/o suicidal ideations.  He states symptoms have gotten worse since leaving ED earlier today.  Pt is medically cleared, he was moved back to psych ED where he became very agitated- see notes above, required chemical restraints.  Pt will be evaluated by TTS.  Psych holding orders written.     Ethelda ChickMartha K Linker, MD 08/21/13 [phone]

## 2013-08-20 NOTE — ED Notes (Signed)
Patient appears asleep. Respirations even and unlabored.

## 2013-08-20 NOTE — ED Notes (Signed)
Patient slammed his head against the wall. Patient laughed, said he was dizzy and refused to sit. Patient states "They think they can take me. I have the strength of superman".

## 2013-08-21 ENCOUNTER — Encounter (HOSPITAL_COMMUNITY): Payer: Self-pay | Admitting: Psychiatry

## 2013-08-21 DIAGNOSIS — F29 Unspecified psychosis not due to a substance or known physiological condition: Secondary | ICD-10-CM

## 2013-08-21 DIAGNOSIS — F259 Schizoaffective disorder, unspecified: Secondary | ICD-10-CM

## 2013-08-21 MED ORDER — DIPHENHYDRAMINE HCL 50 MG/ML IJ SOLN
25.0000 mg | Freq: Once | INTRAMUSCULAR | Status: AC
  Administered 2013-08-21: 25 mg via INTRAMUSCULAR
  Filled 2013-08-21: qty 1

## 2013-08-21 MED ORDER — ZIPRASIDONE MESYLATE 20 MG IM SOLR
INTRAMUSCULAR | Status: AC
  Administered 2013-08-21: 20 mg via INTRAMUSCULAR
  Filled 2013-08-21: qty 20

## 2013-08-21 MED ORDER — LORAZEPAM 2 MG/ML IJ SOLN
2.0000 mg | Freq: Four times a day (QID) | INTRAMUSCULAR | Status: DC
  Administered 2013-08-21: 2 mg via INTRAMUSCULAR
  Filled 2013-08-21 (×2): qty 1

## 2013-08-21 MED ORDER — LORAZEPAM 2 MG/ML IJ SOLN
2.0000 mg | Freq: Once | INTRAMUSCULAR | Status: AC
  Administered 2013-08-21: 2 mg via INTRAMUSCULAR

## 2013-08-21 MED ORDER — HALOPERIDOL LACTATE 5 MG/ML IJ SOLN
5.0000 mg | Freq: Once | INTRAMUSCULAR | Status: AC
  Administered 2013-08-21: 5 mg via INTRAMUSCULAR
  Filled 2013-08-21: qty 1

## 2013-08-21 MED ORDER — ZIPRASIDONE MESYLATE 20 MG IM SOLR
20.0000 mg | Freq: Once | INTRAMUSCULAR | Status: AC
  Administered 2013-08-21: 20 mg via INTRAMUSCULAR

## 2013-08-21 NOTE — ED Notes (Signed)
Patient was in front of nursing station and lowered himself to floor.  Escorted to room.  BP 136/73 97.7 pulse 84  o2 @ 100% on room air. Gatorade given and encouraged to stay in bed.  HOB elevated.

## 2013-08-21 NOTE — ED Notes (Signed)
Continuously at nurses desk asking questions over and over.  Unable to redirect.  Demanding.

## 2013-08-21 NOTE — Progress Notes (Signed)
CS[facility] with [place].   Byrd HesselbachKristen Reed, LCSW [phone]  ED CSW 08/21/2013 1050am

## 2013-08-21 NOTE — ED Notes (Signed)
Pt states he does not want to go to [facility].  He states, "[facility] then I want to go home.  I don't understand why I can't go back there." He states he is going to speak with the MD tomorrow to see if they can change his status back to voluntary so that he can be released to his mother.

## 2013-08-21 NOTE — Progress Notes (Signed)
Patient refused vitals. When the writer walked into the patients room with the vital sign machine he stated "No".

## 2013-08-21 NOTE — ED Notes (Signed)
Patient alert. States that he is having nightmares of [place] and [place]. States "I need to hurt". States that self harm stops [place]la and Joshua. States his medications are not strong enough. Patient requests more medications to sleep.  Encouragement offered.   Q 15 safety checks continue.

## 2013-08-21 NOTE — Progress Notes (Signed)
[facility], for pri[facility]. CSW awaiting return call.   Byrd HesselbachKristen Reed, LCSW [phone]  ED CSW 08/21/2013 1000am

## 2013-08-21 NOTE — ED Notes (Signed)
Patient is agitated and restless.  Continues to demand to see the doctor. Continues to demand an explanation for IVC.  Stated "I'll take care of that person" in reference to the magistrate who signed off on IVC paperwork.  Calm redirection given.  Is requesting prn IM medications.  Reported to provider.

## 2013-08-21 NOTE — Consult Note (Signed)
[facility] Face-to-Face Psychiatry Consult   Reason for Consult:  Psychosis Referring Physician:  EDP  [name] is an 23 y.o. male. Total Time spent with patient: 20 minutes  Assessment: AXIS I:  Schizoaffective Disorderwith psychosis AXIS II:  Deferred AXIS III:   Past Medical History  Diagnosis Date  . Asthma   . Bipolar 1 disorder   . ODD (oppositional defiant disorder)   . ADHD (attention deficit hyperactivity disorder)   . Schizophrenia    AXIS IV:  other psychosocial or environmental problems, problems related to social environment and problems with primary support group AXIS V:  21-30 behavior considerably influenced by delusions or hallucinations OR serious impairment in judgment, communication OR inability to function in almost all areas  Plan:  Recommend psychiatric Inpatient admission when medically cleared. .Dr. Louretta Shorten assessed the patient and concurs with the plan.  Subjective:   [name] is a 23 y.o. male patient admitted with psychosis.  HPI:  The patient was discharge and returned with command hallucinations to kill himself or others.  He had to be placed in restraints last night and had to be given Geodon this am and Ativan by lunch time.  Aggressive, pacing, irritable.  Past Psychiatric History: Past Medical History  Diagnosis Date  . Asthma   . Bipolar 1 disorder   . ODD (oppositional defiant disorder)   . ADHD (attention deficit hyperactivity disorder)   . Schizophrenia     reports that he has never smoked. He does not have any smokeless tobacco history on file. He reports that he drinks alcohol. He reports that he does not use illicit drugs. History reviewed. No pertinent family history.         Allergies:  No Known Allergies  ACT Assessment Complete:  Yes:    Educational Status    Risk to Self: Risk to self Is patient at risk for suicide?: Yes Substance abuse history and/or treatment for substance abuse?: No  Risk to Others:     Abuse:    Prior Inpatient Therapy:    Prior Outpatient Therapy:    Additional Information:                    Objective: Blood pressure 120/82, pulse 82, temperature 98.6 F (37 C), temperature source Oral, resp. rate 18, height $RemoveBe'5\' 10"'mDeOsLrRJ$  (1.778 m), weight 250 lb (113.399 kg), SpO2 98.00%.Body mass index is 35.87 kg/(m^2). Results for orders placed during the hospital encounter of 08/19/13 (from the past 72 hour(s))  ACETAMINOPHEN LEVEL     Status: None   Collection Time    08/19/13 10:36 PM      Result Value Ref Range   Acetaminophen (Tylenol), Serum <15.0  10 - 30 ug/mL   Comment:            THERAPEUTIC CONCENTRATIONS VARY     SIGNIFICANTLY. A RANGE OF 10-30     ug/mL MAY BE AN EFFECTIVE     CONCENTRATION FOR MANY PATIENTS.     HOWEVER, SOME ARE BEST TREATED     AT CONCENTRATIONS OUTSIDE THIS     RANGE.     ACETAMINOPHEN CONCENTRATIONS     >150 ug/mL AT 4 HOURS AFTER     INGESTION AND >50 ug/mL AT 12     HOURS AFTER INGESTION ARE     OFTEN ASSOCIATED WITH TOXIC     REACTIONS.  CBC     Status: None   Collection Time    08/19/13 10:36 PM  Result Value Ref Range   WBC 5.7  4.0 - 10.5 K/uL   RBC 5.77  4.22 - 5.81 MIL/uL   Hemoglobin 15.8  13.0 - 17.0 g/dL   HCT 46.4  39.0 - 52.0 %   MCV 80.4  78.0 - 100.0 fL   MCH 27.4  26.0 - 34.0 pg   MCHC 34.1  30.0 - 36.0 g/dL   RDW 12.9  11.5 - 15.5 %   Platelets 307  150 - 400 K/uL  COMPREHENSIVE METABOLIC PANEL     Status: Abnormal   Collection Time    08/19/13 10:36 PM      Result Value Ref Range   Sodium 137  137 - 147 mEq/L   Potassium 4.1  3.7 - 5.3 mEq/L   Chloride 96  96 - 112 mEq/L   CO2 25  19 - 32 mEq/L   Glucose, Bld 87  70 - 99 mg/dL   BUN 12  6 - 23 mg/dL   Creatinine, Ser 0.98  0.50 - 1.35 mg/dL   Calcium 9.5  8.4 - 10.5 mg/dL   Total Protein 7.9  6.0 - 8.3 g/dL   Albumin 4.3  3.5 - 5.2 g/dL   AST 30  0 - 37 U/L   ALT 37  0 - 53 U/L   Alkaline Phosphatase 74  39 - 117 U/L   Total Bilirubin  <0.2 (*) 0.3 - 1.2 mg/dL   GFR calc non Af Amer >90  >90 mL/min   GFR calc Af Amer >90  >90 mL/min   Comment: (NOTE)     The eGFR has been calculated using the CKD EPI equation.     This calculation has not been validated in all clinical situations.     eGFR's persistently <90 mL/min signify possible Chronic Kidney     Disease.  ETHANOL     Status: None   Collection Time    08/19/13 10:36 PM      Result Value Ref Range   Alcohol, Ethyl (B) <11  0 - 11 mg/dL   Comment:            LOWEST DETECTABLE LIMIT FOR     SERUM ALCOHOL IS 11 mg/dL     FOR MEDICAL PURPOSES ONLY  SALICYLATE LEVEL     Status: Abnormal   Collection Time    08/19/13 10:36 PM      Result Value Ref Range   Salicylate Lvl <3.7 (*) 2.8 - 20.0 mg/dL  URINE RAPID DRUG SCREEN (HOSP PERFORMED)     Status: None   Collection Time    08/19/13 10:46 PM      Result Value Ref Range   Opiates NONE DETECTED  NONE DETECTED   Cocaine NONE DETECTED  NONE DETECTED   Benzodiazepines NONE DETECTED  NONE DETECTED   Amphetamines NONE DETECTED  NONE DETECTED   Tetrahydrocannabinol NONE DETECTED  NONE DETECTED   Barbiturates NONE DETECTED  NONE DETECTED   Comment:            DRUG SCREEN FOR MEDICAL PURPOSES     ONLY.  IF CONFIRMATION IS NEEDED     FOR ANY PURPOSE, NOTIFY LAB     WITHIN 5 DAYS.                LOWEST DETECTABLE LIMITS     FOR URINE DRUG SCREEN     Drug Class       Cutoff (ng/mL)     Amphetamine  1000     Barbiturate      200     Benzodiazepine   144     Tricyclics       818     Opiates          300     Cocaine          300     THC              50   Labs are reviewed and are pertinent for no medical issues noted.  Current Facility-Administered Medications  Medication Dose Route Frequency Ahyana Skillin Last Rate Last Dose  . acetaminophen (TYLENOL) tablet 650 mg  650 mg Oral Q4H PRN Threasa Beards, MD   650 mg at 08/20/13 2311  . alum & mag hydroxide-simeth (MAALOX/MYLANTA) 200-200-20 MG/5ML suspension 30 mL   30 mL Oral PRN Threasa Beards, MD      . benztropine (COGENTIN) tablet 1 mg  1 mg Oral QHS Lurena Nida, NP   1 mg at 08/20/13 2237  . carbamazepine (TEGRETOL XR) 12 hr tablet 200 mg  200 mg Oral BID Lurena Nida, NP   200 mg at 08/21/13 0914  . diphenhydrAMINE (BENADRYL) injection 50 mg  50 mg Intravenous Once Clarene Reamer, MD      . haloperidol (HALDOL) tablet 10 mg  10 mg Oral QHS Lurena Nida, NP   10 mg at 08/20/13 2238  . ibuprofen (ADVIL,MOTRIN) tablet 600 mg  600 mg Oral Q8H PRN Threasa Beards, MD   600 mg at 08/20/13 2023  . LORazepam (ATIVAN) injection 2 mg  2 mg Intravenous Once Clarene Reamer, MD      . LORazepam (ATIVAN) tablet 1 mg  1 mg Oral Q8H PRN Threasa Beards, MD   1 mg at 08/21/13 0839  . ondansetron (ZOFRAN) tablet 4 mg  4 mg Oral Q8H PRN Threasa Beards, MD      . traZODone (DESYREL) tablet 150 mg  150 mg Oral QHS Lurena Nida, NP       Current Outpatient Prescriptions  Medication Sig Dispense Refill  . carbamazepine (TEGRETOL XR) 200 MG 12 hr tablet Take 1 tablet (200 mg total) by mouth 2 (two) times daily.  60 tablet  0  . benztropine (COGENTIN) 1 MG tablet Take 1 tablet (1 mg total) by mouth at bedtime.  30 tablet  0  . haloperidol (HALDOL) 10 MG tablet Take 1 tablet (10 mg total) by mouth at bedtime.  30 tablet  0  . traZODone (DESYREL) 150 MG tablet Take 1 tablet (150 mg total) by mouth at bedtime.  30 tablet  0    Psychiatric Specialty Exam:     Blood pressure 120/82, pulse 82, temperature 98.6 F (37 C), temperature source Oral, resp. rate 18, height $RemoveBe'5\' 10"'hAVteIaYU$  (1.778 m), weight 250 lb (113.399 kg), SpO2 98.00%.Body mass index is 35.87 kg/(m^2).  General Appearance: Casual  Eye Contact::  Fair  Speech:  Normal Rate  Volume:  Normal  Mood:  Anxious  Affect:  Flat  Thought Process:  Auditory hallucinations telling him to kill himself and others  Orientation:  Full (Time, Place, and Person)  Thought Content:  Hallucinations: Auditory  Suicidal  Thoughts:  No  Homicidal Thoughts:  No  Memory:  Immediate;   Fair Recent;   Fair Remote;   Fair  Judgement:  Poor  Insight:  Lacking  Psychomotor Activity:  Normal  Concentration:  Fair  Recall:  Smiley Houseman of [place]: Fair  Akathisia:  No  Handed:  Right  AIMS (if indicated):     Assets:  Leisure Time Physical Health Resilience  Sleep:      Musculoskeletal: Strength & Muscle Tone: within normal limits Gait & Station: normal Patient leans: N/A  Treatment Plan Summary: Daily contact with patient to assess and evaluate symptoms and progress in treatment Medication management; admit to [facility]--on the priority  list  Waylan Boga, [place] 08/21/2013 11:48 AM  Patient is seen face to face for psych evaluation, formulated treatment plan and reviewed the information documented and agree with the treatment plan.  JONNALAGADDA,JANARDHAHA R. 08/24/2013 12:36 PM

## 2013-08-21 NOTE — ED Notes (Signed)
Patient escalating and banging on wall and window.  Refuse to cooperate.  Hyperverbal.  Threatening to bang.

## 2013-08-21 NOTE — ED Notes (Signed)
NP requested staff to assist pt to restroom for safety (fall prevention).

## 2013-08-22 ENCOUNTER — Encounter (HOSPITAL_COMMUNITY): Payer: Self-pay | Admitting: Psychiatry

## 2013-08-22 DIAGNOSIS — R4689 Other symptoms and signs involving appearance and behavior: Secondary | ICD-10-CM | POA: Diagnosis present

## 2013-08-22 DIAGNOSIS — F259 Schizoaffective disorder, unspecified: Secondary | ICD-10-CM

## 2013-08-22 DIAGNOSIS — F909 Attention-deficit hyperactivity disorder, unspecified type: Secondary | ICD-10-CM

## 2013-08-22 MED ORDER — POLYVINYL ALCOHOL 1.4 % OP SOLN
1.0000 [drp] | OPHTHALMIC | Status: DC | PRN
  Filled 2013-08-22: qty 15

## 2013-08-22 MED ORDER — LORAZEPAM 1 MG PO TABS
2.0000 mg | ORAL_TABLET | Freq: Four times a day (QID) | ORAL | Status: DC | PRN
  Administered 2013-08-22: 2 mg via ORAL
  Filled 2013-08-22: qty 2

## 2013-08-22 NOTE — ED Notes (Signed)
Up at the desk looking at magazines.

## 2013-08-22 NOTE — ED Notes (Signed)
Heartburn resolved

## 2013-08-22 NOTE — ED Notes (Signed)
Pt reports increasing anxiety about transfer and when it will occur

## 2013-08-22 NOTE — ED Notes (Signed)
Up tot he bathroom to bathroom to shower and change scrubs

## 2013-08-22 NOTE — Consult Note (Signed)
[facility] Face-to-Face Psychiatry Consult   Reason for Consult:  Hallucinations Referring Physician:  EDP  [name] is an 23 y.o. male. Total Time spent with patient: 20 minutes  Assessment: AXIS I:  ADHD, hyperactive type and Schizoaffective Disorder AXIS II:  Deferred AXIS III:   Past Medical History  Diagnosis Date  . Asthma   . Bipolar 1 disorder   . ODD (oppositional defiant disorder)   . ADHD (attention deficit hyperactivity disorder)   . Schizophrenia    AXIS IV:  other psychosocial or environmental problems, problems related to social environment and problems with primary support group AXIS V:  21-30 behavior considerably influenced by delusions or hallucinations OR serious impairment in judgment, communication OR inability to function in almost all areas  Plan:  Recommend psychiatric Inpatient admission when medically cleared.  Subjective:   [name] is a 23 y.o. male patient admitted with command hallucinations to kill himself and others.  HPI:  Patient continues with attention seeking behaviors.  He requests to go home, if he can't go to [facility].  Pacing and agitated at times, redirection needed.  Past Psychiatric History: Past Medical History  Diagnosis Date  . Asthma   . Bipolar 1 disorder   . ODD (oppositional defiant disorder)   . ADHD (attention deficit hyperactivity disorder)   . Schizophrenia     reports that he has never smoked. He does not have any smokeless tobacco history on file. He reports that he drinks alcohol. He reports that he does not use illicit drugs. History reviewed. No pertinent family history.         Allergies:  No Known Allergies  ACT Assessment Complete:  Yes:    Educational Status    Risk to Self: Risk to self Is patient at risk for suicide?: Yes Substance abuse history and/or treatment for substance abuse?: No  Risk to Others:    Abuse:    Prior Inpatient Therapy:    Prior Outpatient Therapy:    Additional Information:                     Objective: Blood pressure 109/74, pulse 79, temperature 97.5 F (36.4 C), temperature source Oral, resp. rate 20, height $RemoveBe'5\' 10"'xNNavXnhw$  (1.778 m), weight 250 lb (113.399 kg), SpO2 97.00%.Body mass index is 35.87 kg/(m^2). Results for orders placed during the hospital encounter of 08/19/13 (from the past 72 hour(s))  ACETAMINOPHEN LEVEL     Status: None   Collection Time    08/19/13 10:36 PM      Result Value Ref Range   Acetaminophen (Tylenol), Serum <15.0  10 - 30 ug/mL   Comment:            THERAPEUTIC CONCENTRATIONS VARY     SIGNIFICANTLY. A RANGE OF 10-30     ug/mL MAY BE AN EFFECTIVE     CONCENTRATION FOR MANY PATIENTS.     HOWEVER, SOME ARE BEST TREATED     AT CONCENTRATIONS OUTSIDE THIS     RANGE.     ACETAMINOPHEN CONCENTRATIONS     >150 ug/mL AT 4 HOURS AFTER     INGESTION AND >50 ug/mL AT 12     HOURS AFTER INGESTION ARE     OFTEN ASSOCIATED WITH TOXIC     REACTIONS.  CBC     Status: None   Collection Time    08/19/13 10:36 PM      Result Value Ref Range   WBC 5.7  4.0 - 10.5 K/uL  RBC 5.77  4.22 - 5.81 MIL/uL   Hemoglobin 15.8  13.0 - 17.0 g/dL   HCT 46.4  39.0 - 52.0 %   MCV 80.4  78.0 - 100.0 fL   MCH 27.4  26.0 - 34.0 pg   MCHC 34.1  30.0 - 36.0 g/dL   RDW 12.9  11.5 - 15.5 %   Platelets 307  150 - 400 K/uL  COMPREHENSIVE METABOLIC PANEL     Status: Abnormal   Collection Time    08/19/13 10:36 PM      Result Value Ref Range   Sodium 137  137 - 147 mEq/L   Potassium 4.1  3.7 - 5.3 mEq/L   Chloride 96  96 - 112 mEq/L   CO2 25  19 - 32 mEq/L   Glucose, Bld 87  70 - 99 mg/dL   BUN 12  6 - 23 mg/dL   Creatinine, Ser 0.98  0.50 - 1.35 mg/dL   Calcium 9.5  8.4 - 10.5 mg/dL   Total Protein 7.9  6.0 - 8.3 g/dL   Albumin 4.3  3.5 - 5.2 g/dL   AST 30  0 - 37 U/L   ALT 37  0 - 53 U/L   Alkaline Phosphatase 74  39 - 117 U/L   Total Bilirubin <0.2 (*) 0.3 - 1.2 mg/dL   GFR calc non Af Amer >90  >90 mL/min   GFR calc Af Amer >90  >90  mL/min   Comment: (NOTE)     The eGFR has been calculated using the CKD EPI equation.     This calculation has not been validated in all clinical situations.     eGFR's persistently <90 mL/min signify possible Chronic Kidney     Disease.  ETHANOL     Status: None   Collection Time    08/19/13 10:36 PM      Result Value Ref Range   Alcohol, Ethyl (B) <11  0 - 11 mg/dL   Comment:            LOWEST DETECTABLE LIMIT FOR     SERUM ALCOHOL IS 11 mg/dL     FOR MEDICAL PURPOSES ONLY  SALICYLATE LEVEL     Status: Abnormal   Collection Time    08/19/13 10:36 PM      Result Value Ref Range   Salicylate Lvl <2.2 (*) 2.8 - 20.0 mg/dL  URINE RAPID DRUG SCREEN (HOSP PERFORMED)     Status: None   Collection Time    08/19/13 10:46 PM      Result Value Ref Range   Opiates NONE DETECTED  NONE DETECTED   Cocaine NONE DETECTED  NONE DETECTED   Benzodiazepines NONE DETECTED  NONE DETECTED   Amphetamines NONE DETECTED  NONE DETECTED   Tetrahydrocannabinol NONE DETECTED  NONE DETECTED   Barbiturates NONE DETECTED  NONE DETECTED   Comment:            DRUG SCREEN FOR MEDICAL PURPOSES     ONLY.  IF CONFIRMATION IS NEEDED     FOR ANY PURPOSE, NOTIFY LAB     WITHIN 5 DAYS.                LOWEST DETECTABLE LIMITS     FOR URINE DRUG SCREEN     Drug Class       Cutoff (ng/mL)     Amphetamine      1000     Barbiturate      200  Benzodiazepine   323     Tricyclics       557     Opiates          300     Cocaine          300     THC              50   Labs are reviewed and are pertinent for no medical issues needed.  Current Facility-Administered Medications  Medication Dose Route Frequency Okey Zelek Last Rate Last Dose  . acetaminophen (TYLENOL) tablet 650 mg  650 mg Oral Q4H PRN Threasa Beards, MD   650 mg at 08/21/13 1548  . alum & mag hydroxide-simeth (MAALOX/MYLANTA) 200-200-20 MG/5ML suspension 30 mL  30 mL Oral PRN Threasa Beards, MD   30 mL at 08/22/13 1101  . benztropine (COGENTIN)  tablet 1 mg  1 mg Oral QHS Lurena Nida, NP   1 mg at 08/21/13 2136  . carbamazepine (TEGRETOL XR) 12 hr tablet 200 mg  200 mg Oral BID Lurena Nida, NP   200 mg at 08/22/13 1043  . diphenhydrAMINE (BENADRYL) injection 50 mg  50 mg Intravenous Once Clarene Reamer, MD      . haloperidol (HALDOL) tablet 10 mg  10 mg Oral QHS Lurena Nida, NP   10 mg at 08/21/13 2136  . ibuprofen (ADVIL,MOTRIN) tablet 600 mg  600 mg Oral Q8H PRN Threasa Beards, MD   600 mg at 08/22/13 0133  . LORazepam (ATIVAN) injection 2 mg  2 mg Intramuscular Q6H Waylan Boga, NP   2 mg at 08/21/13 1239  . LORazepam (ATIVAN) tablet 2 mg  2 mg Oral Q6H PRN Waylan Boga, NP      . ondansetron [facility]) tablet 4 mg  4 mg Oral Q8H PRN Threasa Beards, MD      . polyvinyl alcohol (LIQUIFILM TEARS) 1.4 % ophthalmic solution 1 drop  1 drop Both Eyes PRN Lurena Nida, NP      . traZODone (DESYREL) tablet 150 mg  150 mg Oral QHS Lurena Nida, NP   150 mg at 08/21/13 2136   Current Outpatient Prescriptions  Medication Sig Dispense Refill  . carbamazepine (TEGRETOL XR) 200 MG 12 hr tablet Take 1 tablet (200 mg total) by mouth 2 (two) times daily.  60 tablet  0  . benztropine (COGENTIN) 1 MG tablet Take 1 tablet (1 mg total) by mouth at bedtime.  30 tablet  0  . haloperidol (HALDOL) 10 MG tablet Take 1 tablet (10 mg total) by mouth at bedtime.  30 tablet  0  . traZODone (DESYREL) 150 MG tablet Take 1 tablet (150 mg total) by mouth at bedtime.  30 tablet  0    Psychiatric Specialty Exam:     Blood pressure 109/74, pulse 79, temperature 97.5 F (36.4 C), temperature source Oral, resp. rate 20, height $RemoveBe'5\' 10"'SWOwOYDIn$  (1.778 m), weight 250 lb (113.399 kg), SpO2 97.00%.Body mass index is 35.87 kg/(m^2).  General Appearance: Casual  Eye Contact::  Fair  Speech:  Normal Rate  Volume:  Normal  Mood:  Anxious and Irritable  Affect:  Blunt  Thought Process:  Coherent  Orientation:  Full (Time, Place, and Person)  Thought Content:   Hallucinations: Auditory Command:  kill himself and others Visual  Suicidal Thoughts:  No  Homicidal Thoughts:  No  Memory:  Immediate;   Fair Recent;   Fair Remote;   Fair  Judgement:  Poor  Insight:  Lacking  Psychomotor Activity:  Increased  Concentration:  Fair  Recall:  McNeil: Fair  Akathisia:  No  Handed:  Right  AIMS (if indicated):     Assets:  Financial Resources/Insurance Housing Leisure Time Physical Health Resilience  Sleep:      Musculoskeletal: Strength & Muscle Tone: within normal limits Gait & Station: normal Patient leans: N/A  Treatment Plan Summary: Daily contact with patient to assess and evaluate symptoms and progress in treatment Medication management; admit to [facility].  Waylan Boga, [place] 08/22/2013 11:22 AM  I have personally seen the patient and agreed with the findings and involved in the treatment plan. Berniece Andreas, MD

## 2013-08-22 NOTE — ED Notes (Signed)
IVC papers, MAR, transfer report, EMTELA, belongings sent w/ sheriff.

## 2013-08-22 NOTE — ED Notes (Signed)
Up to the bathroom 

## 2013-08-22 NOTE — ED Notes (Signed)
Dr arfeen and jamison into see 

## 2013-08-22 NOTE — ED Notes (Signed)
May change ativan to Morrie SheldonPO-VORB jamison NP

## 2013-08-22 NOTE — ED Notes (Signed)
Sheriff will be here in 20 mins to transport

## 2013-08-22 NOTE — ED Notes (Signed)
MAR, IVC papers, and [facility]

## 2013-08-22 NOTE — ED Notes (Addendum)
[facility]

## 2013-08-22 NOTE — ED Notes (Signed)
Message left for sheriff 

## 2013-08-22 NOTE — ED Notes (Addendum)
Up to the desk asking about being transferred to other hospital.  Reports he slept well, requesting to talk to the MD, pt reports that if he can't go to the other hospital to day, he wants to go home.  Pt is aware that he will see the psychiatrist this morning.

## 2013-08-22 NOTE — ED Notes (Signed)
Up in hall/activity room walking around, nad, pt c/o dizzyness, encouraged to sit down until it resolves, pt initially declined to sit and reported that he was fine and only a little dizzy.  Dizzyness resolved after a few minutes.

## 2013-08-22 NOTE — ED Notes (Signed)
Sheriff will pick up in approx 2 hrs

## 2013-09-02 ENCOUNTER — Emergency Department (HOSPITAL_COMMUNITY)
Admission: EM | Admit: 2013-09-02 | Discharge: 2013-09-03 | Disposition: A | Discharge status: Home / Self Care | Payer: Medicaid Other | Attending: Emergency Medicine | Admitting: Emergency Medicine

## 2013-09-02 ENCOUNTER — Encounter (HOSPITAL_COMMUNITY): Payer: Self-pay | Admitting: Emergency Medicine

## 2013-09-02 DIAGNOSIS — F913 Oppositional defiant disorder: Secondary | ICD-10-CM | POA: Diagnosis not present

## 2013-09-02 DIAGNOSIS — F209 Schizophrenia, unspecified: Secondary | ICD-10-CM | POA: Diagnosis not present

## 2013-09-02 DIAGNOSIS — F259 Schizoaffective disorder, unspecified: Secondary | ICD-10-CM | POA: Diagnosis present

## 2013-09-02 DIAGNOSIS — F909 Attention-deficit hyperactivity disorder, unspecified type: Secondary | ICD-10-CM | POA: Insufficient documentation

## 2013-09-02 DIAGNOSIS — R443 Hallucinations, unspecified: Secondary | ICD-10-CM | POA: Diagnosis not present

## 2013-09-02 DIAGNOSIS — F319 Bipolar disorder, unspecified: Secondary | ICD-10-CM | POA: Diagnosis not present

## 2013-09-02 DIAGNOSIS — Z8659 Personal history of other mental and behavioral disorders: Secondary | ICD-10-CM

## 2013-09-02 DIAGNOSIS — J45909 Unspecified asthma, uncomplicated: Secondary | ICD-10-CM | POA: Insufficient documentation

## 2013-09-02 DIAGNOSIS — R45851 Suicidal ideations: Secondary | ICD-10-CM | POA: Diagnosis present

## 2013-09-02 DIAGNOSIS — R4689 Other symptoms and signs involving appearance and behavior: Secondary | ICD-10-CM

## 2013-09-02 LAB — COMPREHENSIVE METABOLIC PANEL
ALK PHOS: 77 U/L (ref 39–117)
ALT: 21 U/L (ref 0–53)
AST: 22 U/L (ref 0–37)
Albumin: 4.5 g/dL (ref 3.5–5.2)
Anion gap: 13 (ref 5–15)
BUN: 9 mg/dL (ref 6–23)
CALCIUM: 9.3 mg/dL (ref 8.4–10.5)
CO2: 28 mEq/L (ref 19–32)
Chloride: 100 mEq/L (ref 96–112)
Creatinine, Ser: 0.93 mg/dL (ref 0.50–1.35)
Glucose, Bld: 95 mg/dL (ref 70–99)
POTASSIUM: 4.5 meq/L (ref 3.7–5.3)
Sodium: 141 mEq/L (ref 137–147)
TOTAL PROTEIN: 8 g/dL (ref 6.0–8.3)
Total Bilirubin: <0.2 mg/dL — ABNORMAL LOW (ref 0.3–1.2)

## 2013-09-02 LAB — SALICYLATE LEVEL

## 2013-09-02 LAB — CBC
HEMATOCRIT: 43.1 % (ref 39.0–52.0)
Hemoglobin: 14.9 g/dL (ref 13.0–17.0)
MCH: 27.6 pg (ref 26.0–34.0)
MCHC: 34.6 g/dL (ref 30.0–36.0)
MCV: 79.8 fL (ref 78.0–100.0)
Platelets: 294 10*3/uL (ref 150–400)
RBC: 5.4 MIL/uL (ref 4.22–5.81)
RDW: 12.7 % (ref 11.5–15.5)
WBC: 4.2 10*3/uL (ref 4.0–10.5)

## 2013-09-02 LAB — ACETAMINOPHEN LEVEL: Acetaminophen (Tylenol), Serum: <15 ug/mL (ref 10–30)

## 2013-09-02 LAB — RAPID URINE DRUG SCREEN, HOSP PERFORMED
AMPHETAMINES: NOT DETECTED
BARBITURATES: NOT DETECTED
BENZODIAZEPINES: NOT DETECTED
Cocaine: NOT DETECTED
Opiates: NOT DETECTED
Tetrahydrocannabinol: NOT DETECTED

## 2013-09-02 LAB — ETHANOL: Alcohol, Ethyl (B): <11 mg/dL (ref 0–11)

## 2013-09-02 MED ORDER — ALUM & MAG HYDROXIDE-SIMETH 200-200-20 MG/5ML PO SUSP: 30.0000 mL | ORAL | Status: DC | PRN

## 2013-09-02 MED ORDER — BENZTROPINE MESYLATE 1 MG PO TABS
1.0000 mg | ORAL_TABLET | Freq: Every day | ORAL | Status: DC
  Administered 2013-09-02: 1 mg via ORAL
  Filled 2013-09-02: qty 1

## 2013-09-02 MED ORDER — CARBAMAZEPINE ER 200 MG PO TB12
200.0000 mg | ORAL_TABLET | Freq: Two times a day (BID) | ORAL | Status: DC
  Administered 2013-09-02 – 2013-09-03 (×2): 200 mg via ORAL
  Filled 2013-09-02 (×6): qty 1

## 2013-09-02 MED ORDER — TRAZODONE HCL 50 MG PO TABS
150.0000 mg | ORAL_TABLET | Freq: Every day | ORAL | Status: DC
  Administered 2013-09-02: 150 mg via ORAL
  Filled 2013-09-02 (×2): qty 1

## 2013-09-02 MED ORDER — IBUPROFEN 200 MG PO TABS
600.0000 mg | ORAL_TABLET | Freq: Three times a day (TID) | ORAL | Status: DC | PRN
  Administered 2013-09-02: 600 mg via ORAL
  Filled 2013-09-02: qty 3

## 2013-09-02 MED ORDER — HALOPERIDOL 5 MG PO TABS
10.0000 mg | ORAL_TABLET | Freq: Every day | ORAL | Status: DC
  Administered 2013-09-02: 10 mg via ORAL
  Filled 2013-09-02: qty 2

## 2013-09-02 MED ORDER — ZIPRASIDONE MESYLATE 20 MG IM SOLR
20.0000 mg | Freq: Once | INTRAMUSCULAR | Status: AC
  Administered 2013-09-02: 20 mg via INTRAMUSCULAR
  Filled 2013-09-02 (×2): qty 20

## 2013-09-02 MED ORDER — LORAZEPAM 1 MG PO TABS
1.0000 mg | ORAL_TABLET | Freq: Three times a day (TID) | ORAL | Status: DC | PRN
  Administered 2013-09-02 – 2013-09-03 (×2): 1 mg via ORAL
  Filled 2013-09-02 (×2): qty 1

## 2013-09-02 MED ORDER — ONDANSETRON HCL 4 MG PO TABS: 4.0000 mg | ORAL_TABLET | Freq: Three times a day (TID) | ORAL | Status: DC | PRN

## 2013-09-02 MED ORDER — NICOTINE 21 MG/24HR TD PT24: 21.0000 mg | MEDICATED_PATCH | Freq: Every day | TRANSDERMAL | Status: DC

## 2013-09-02 MED ORDER — ZOLPIDEM TARTRATE 5 MG PO TABS: 5.0000 mg | ORAL_TABLET | Freq: Every evening | ORAL | Status: DC | PRN

## 2013-09-02 MED ORDER — HALOPERIDOL 5 MG PO TABS
10.0000 mg | ORAL_TABLET | Freq: Every day | ORAL | Status: DC
  Filled 2013-09-02: qty 2

## 2013-09-02 NOTE — ED Provider Notes (Addendum)
Patient with history of schizoaffective disorder. He is here for suicidal ideation and auditory hallucinations. Is apparently been actively suicidal while in the emergency department. He has tried to hang himself with bed sheets and stab himself with a plastic silverware. After assessment by behavioral health, they've come to the conclusion that patient would be much safer and [facility]. I've spoken with Dr[facility] Gwendolyn GrantWalden at [facility] who agrees to accept in transfer.  Geoffery Lyonsouglas Felissa Blouch, MD 09/02/13 1539  When I informed the patient of his impending [facility], he became threatening and verbally abusive. He informed me he is no longer suicidal or homicidal and wants to go home. I have informed him he is now involuntarily committed and have initiated the paperwork.  Geoffery Lyonsouglas Selby Slovacek, MD 09/02/13 [phone]
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
## 2013-09-02 NOTE — ED Notes (Signed)
[facility] in [employer] custody

## 2013-09-02 NOTE — ED Notes (Signed)
geodon 20 mg given IM in right deltoid without incident.

## 2013-09-02 NOTE — ED Notes (Addendum)
Pt trying to dig wrist with spoon, spoon removed from possession. Threw applesauce against wall, threw drink against wall. Pt laughing, threw blankets on floor, when sitter picked blankets up and put in laundry, pt requested another sitter.

## 2013-09-02 NOTE — ED Notes (Signed)
Pt from home, [facility], with threats to harm himself. Pt refuses to state what his plan is. Pt states he has been hearing voices and seeing different people since he was 23 years old. Pt states he normally takes meds for this but has not had his meds in "a couple of days". Pt States the voices are telling him to harm himself and others if they mess with him. Pt noted to have cut marks to bilateral arms, no bleeding noted. Pt states he has been cutting himself since he was 23 years old. Pt also reports not being able to sleep x5 days because [name] and Calyn(the voices) are telling him not to sleep. Pt states he has been dx with bipolar mania and also schizophrenia.

## 2013-09-02 NOTE — ED Notes (Signed)
LUNCH ORDERED 

## 2013-09-02 NOTE — ED Notes (Signed)
Pt biting himself and picking at scabs in order to make himself bleed for self mutilation.

## 2013-09-02 NOTE — ED Notes (Signed)
Pt moved to rm C20 with sitter, pt trying to "choke" self with blankets, continuing to bite self. Redirected to other activities, watching TV,

## 2013-09-02 NOTE — ED Provider Notes (Signed)
Medical screening examination/treatment/procedure(s) were performed by non-physician practitioner and as supervising physician I was immediately available for consultation/collaboration.   EKG Interpretation None        Lyanne CoKevin M Yarelin Reichardt, MD 09/02/13 1600

## 2013-09-02 NOTE — ED Notes (Signed)
Dr. Judd Lienelo notified of pt's request to leave-- IVC papers taken out, signed and faxed to magistrate. [employer] and security at bedside

## 2013-09-02 NOTE — ED Notes (Signed)
[employer] at bedside.  Pt talking to two other people in the room (JoshuIvin Bootya and NowataKatelyn) not present in the room.  Pt stating "I want to donate blood to the red cross" when Phlebotomy present and wanting to leave.  Pt advise to sit down to have the blood work drawn, pt cooperative.  Pt appeared anxious with [employer] present but cooperative with staff.
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
## 2013-09-02 NOTE — ED Provider Notes (Signed)
CSN: [id number]     Arrival date & time 09/02/13  1009 History   First MD Initiated Contact with Patient 09/02/13 1040     Chief Complaint  Patient presents with  . Suicidal  . Hallucinations     (Consider location/radiation/quality/duration/timing/severity/associated sxs/prior Treatment) HPI Comments: 23 year old male with a past medical history of bipolar 1 disorder, ODD, ADHD, schizophrenia and asthma presents to the emergency department with the police with hallucinations beginning this morning. It is noted [facility] for similar symptoms. Patient states her are to people present in the room with him names Dorathy DaftKayla and [name] who are telling him to cut his wrists and hurt other people and try to kill himself. Per the police officers, patient called them and when he got to his home he had a knife to his wrist. Patient does not recall calling the police. Level V caveat due to psychosis.  The history is provided by the police and the patient.    Past Medical History  Diagnosis Date  . Asthma   . Bipolar 1 disorder   . ODD (oppositional defiant disorder)   . ADHD (attention deficit hyperactivity disorder)   . Schizophrenia    History reviewed. No pertinent past surgical history. History reviewed. No pertinent family history. History  Substance Use Topics  . Smoking status: Never Smoker   . Smokeless tobacco: Not on file  . Alcohol Use: Yes    Review of Systems  Unable to perform ROS: Psychiatric disorder      Allergies  Review of patient's allergies indicates no known allergies.  Home Medications   Prior to Admission medications   Medication Sig Start Date End Date Taking? Authorizing Provider  benztropine (COGENTIN) 1 MG tablet Take 1 tablet (1 mg total) by mouth at bedtime. 08/13/13   Fransisca KaufmannLaura Davis, NP  carbamazepine (TEGRETOL XR) 200 MG 12 hr tablet Take 1 tablet (200 mg total) by mouth 2 (two) times daily. 08/13/13   Fransisca KaufmannLaura Davis, NP   haloperidol (HALDOL) 10 MG tablet Take 1 tablet (10 mg total) by mouth at bedtime. 08/13/13   Fransisca KaufmannLaura Davis, NP  traZODone (DESYREL) 150 MG tablet Take 1 tablet (150 mg total) by mouth at bedtime. 08/13/13   Fransisca KaufmannLaura Davis, NP   BP 127/81  Pulse 66  Temp(Src) 98.7 F (37.1 C) (Oral)  Resp 19  SpO2 98% Physical Exam  Nursing note and vitals reviewed. Constitutional: He is oriented to person, place, and time. He appears well-developed and well-nourished. No distress.  HENT:  Head: Normocephalic and atraumatic.  Mouth/Throat: Oropharynx is clear and moist.  Eyes: Conjunctivae are normal.  Neck: Normal range of motion. Neck supple.  Cardiovascular: Normal rate, regular rhythm and normal heart sounds.   Pulmonary/Chest: Effort normal and breath sounds normal.  Musculoskeletal: Normal range of motion. He exhibits no edema.  Neurological: He is alert and oriented to person, place, and time.  Skin: Skin is warm and dry. He is not diaphoretic.  Psychiatric: His speech is tangential. He is actively hallucinating (auditory and visual). He expresses suicidal ideation.    ED Course  Procedures (including critical care time) Labs Review Labs Reviewed  COMPREHENSIVE METABOLIC PANEL - Abnormal; Notable for the following:    Total Bilirubin <0.2 (*)    All other components within normal limits  SALICYLATE LEVEL - Abnormal; Notable for the following:    Salicylate Lvl <2.0 (*)    All other components within normal limits  CBC  ETHANOL  ACETAMINOPHEN LEVEL  URINE RAPID DRUG SCREEN (HOSP PERFORMED)    Imaging Review No results found.   EKG Interpretation None      MDM   Final diagnoses:  Suicidal ideation  History of schizoaffective disorder   Pt presenting hallucinating, SI. Non-toxic appearing and in NAD. AFVSS. [facility] Labs pending. TTS consult.  Medically cleared.  It is noted pt found to hang himself with the sheets and stab himself with plastic  silverware, [facility].  Trevor MaceRobyn M Albert, PA-C 09/02/13 1556

## 2013-09-02 NOTE — ED Notes (Signed)
[facility] and also sitter. Pt belongings placed at nurses station and labeled.

## 2013-09-03 ENCOUNTER — Encounter (HOSPITAL_COMMUNITY): Payer: Self-pay | Admitting: Registered Nurse

## 2013-09-03 DIAGNOSIS — F259 Schizoaffective disorder, unspecified: Secondary | ICD-10-CM

## 2013-09-03 NOTE — Consult Note (Signed)
Face to face evaluation and I agree with this note 
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
## 2013-09-03 NOTE — Discharge Instructions (Signed)
Aggression Physically aggressive behavior is common among small children. When frustrated or angry, toddlers may act out. Often, they will push, bite, or hit. Most children show less physical aggression as they grow up. Their language and interpersonal skills improve, too. But continued aggressive behavior is a sign of a problem. This behavior can lead to aggression and delinquency in adolescence and adulthood. Aggressive behavior can be psychological or physical. Forms of psychological aggression include threatening or bullying others. Forms of physical aggression include:  Pushing.  Hitting.  Slapping.  Kicking.  Stabbing.  Shooting.  Raping. PREVENTION  Encouraging the following behaviors can help manage aggression:  Respecting others and valuing differences.  Participating in school and community functions, including sports, music, after-school programs, community groups, and volunteer work.  Talking with an adult when they are sad, depressed, fearful, anxious, or angry. Discussions with a parent or other family member, Veterinary surgeoncounselor, Runner, broadcasting/film/videoteacher, or coach can help.  Avoiding alcohol and drug use.  Dealing with disagreements without aggression, such as conflict resolution. To learn this, children need parents and caregivers to model respectful communication and problem solving.  Limiting exposure to aggression and violence, such as video games that are not age appropriate, violence in the media, or domestic violence. Document Released: 12/10/2006 Document Revised: 05/07/2011 Document Reviewed: 04/20/2010 [facility] Patient Information 2015 [place]. This information is not intended to replace advice given to you by your health care provider. Make sure you discuss any questions you have with your health care provider.  Depression, Adult Depression refers to feeling sad, low, down in the dumps, blue, gloomy, or empty. In general, there are two kinds of depression: 1. Depression  that we all experience from time to time because of upsetting life experiences, including the loss of a job or the ending of a relationship (normal sadness or normal grief). This kind of depression is considered normal, is short lived, and resolves within a few days to 2 weeks. (Depression experienced after the loss of a loved one is called bereavement. Bereavement often lasts longer than 2 weeks but normally gets better with time.) 2. Clinical depression, which lasts longer than normal sadness or normal grief or interferes with your ability to function at home, at work, and in school. It also interferes with your personal relationships. It affects almost every aspect of your life. Clinical depression is an illness. Symptoms of depression also can be caused by conditions other than normal sadness and grief or clinical depression. Examples of these conditions are listed as follows:  Physical illness--Some physical illnesses, including underactive thyroid gland (hypothyroidism), severe anemia, specific types of cancer, diabetes, uncontrolled seizures, heart and lung problems, strokes, and chronic pain are commonly associated with symptoms of depression.  Side effects of some prescription medicine--In some people, certain types of prescription medicine can cause symptoms of depression.  Substance abuse--Abuse of alcohol and illicit drugs can cause symptoms of depression. SYMPTOMS Symptoms of normal sadness and normal grief include the following:  Feeling sad or crying for short periods of time.  Not caring about anything (apathy).  Difficulty sleeping or sleeping too much.  No longer able to enjoy the things you used to enjoy.  Desire to be by oneself all the time (social isolation).  Lack of energy or motivation.  Difficulty concentrating or remembering.  Change in appetite or weight.  Restlessness or agitation. Symptoms of clinical depression include the same symptoms of normal sadness or  normal grief and also the following symptoms:  Feeling sad or crying  all the time.  Feelings of guilt or worthlessness.  Feelings of hopelessness or helplessness.  Thoughts of suicide or the desire to harm yourself (suicidal ideation).  Loss of touch with reality (psychotic symptoms). Seeing or hearing things that are not real (hallucinations) or having false beliefs about your life or the people around you (delusions and paranoia). DIAGNOSIS  The diagnosis of clinical depression usually is based on the severity and duration of the symptoms. Your caregiver also will ask you questions about your medical history and substance use to find out if physical illness, use of prescription medicine, or substance abuse is causing your depression. Your caregiver also may order blood tests. TREATMENT  Typically, normal sadness and normal grief do not require treatment. However, sometimes antidepressant medicine is prescribed for bereavement to ease the depressive symptoms until they resolve. The treatment for clinical depression depends on the severity of your symptoms but typically includes antidepressant medicine, counseling with a mental health professional, or a combination of both. Your caregiver will help to determine what treatment is best for you. Depression caused by physical illness usually goes away with appropriate medical treatment of the illness. If prescription medicine is causing depression, talk with your caregiver about stopping the medicine, decreasing the dose, or substituting another medicine. Depression caused by abuse of alcohol or illicit drugs abuse goes away with abstinence from these substances. Some adults need professional help in order to stop drinking or using drugs. SEEK IMMEDIATE CARE IF:  You have thoughts about hurting yourself or others.  You lose touch with reality (have psychotic symptoms).  You are taking medicine for depression and have a serious side effect. FOR  MORE INFORMATION National Alliance on Mental Illness: www.nami.[employer] of Mental Health: http://www.maynard.net/ Document Released: 02/10/2000 Document Revised: 08/14/2011 Document Reviewed: 05/14/2011 [facility] Patient Information 2015 [place], [place]. This information is not intended to replace advice given to you by your health care provider. Make sure you discuss any questions you have with your health care provider.  Schizoaffective Disorder Schizoaffective disorder (ScAD) is a mental illness. It causes symptoms that are a mixture of schizophrenia (a psychotic disorder) and an affective (mood) disorder. The schizophrenic symptoms may include delusions, hallucinations, or odd behavior. The mood symptoms may be similar to major depression or bipolar disorder. ScAD may interfere with personal relationships or normal daily activities. People with ScAD are at increased risk for job loss, social isolation,physical health problems, anxiety and substance use disorders, and suicide. ScAD usually occurs in cycles. Periods of severe symptoms are followed by periods of less severe symptoms or improvement. The illness affects men and women equally but usually appears at an earlier age (teenage or early adult years) in men. People who have family members with schizophrenia, bipolar disorder, or ScAD are at higher risk of developing ScAD. SYMPTOMS  At any one time, people with ScAD may have psychotic symptoms only or both psychotic and mood symptoms. The psychotic symptoms include one or more of the following:  Hearing, seeing, or feeling things that are not there (hallucinations).   Having fixed, false beliefs (delusions). The delusions usually are of being attacked, harassed, cheated, persecuted, or conspired against (paranoid delusions).  Speaking in a way that makes no sense to others (disorganized speech). The psychotic symptoms of ScAD may also include confusing or odd behavior or any of the  negative symptoms of schizophrenia. These include loss of motivation for normal daily activities, such as bathing or grooming, withdrawal from other people, and lack of  emotions.  The mood symptoms of ScAD occur more often than not. They resemble major depressive disorder or bipolar mania. Symptoms of major depression include depressed mood and four or more of the following:  Loss of interest in usually pleasurable activities (anhedonia).  Sleeping more or less than normal.  Feeling worthless or excessively guilty.  Lack of energy or motivation.  Trouble concentrating.  Eating more or less than usual.  Thinking a lot about death or suicide. Symptoms of bipolar mania include abnormally elevated or irritable mood and increased energy or activity, plus three or more of the following:   More confidence than normal or feeling that you are able to do anything (grandiosity).  Feeling rested with less sleep than normal.   Being easily distracted.   Talking more than usual or feeling pressured to keep talking.   Feeling that your thoughts are racing.  Engaging in high-risk activities such as buying sprees or foolish business decisions. DIAGNOSIS  ScAD is diagnosed through an assessment by your health care provider. Your health care provider will observe and ask questions about your thoughts, behavior, mood, and ability to function in daily life. Your health care provider may also ask questions about your medical history and use of drugs, including prescription medicines. Your health care provider may also order blood tests and imaging exams. Certain medical conditions and substances can cause symptoms that resemble ScAD. Your health care provider may refer you to a mental health specialist for evaluation.  ScAD is divided into two types. The depressive type is diagnosed if your mood symptoms are limited to major depression. The bipolar type is diagnosed if your mood symptoms are manic or  a mixture of manic and depressive symptoms TREATMENT  ScAD is usually a life-long illness. Long-term treatment is necessary. The following treatments are available:  Medicine--Different types of medicine are used to treat ScAD. The exact combination depends on the type and severity of your symptoms. Antipsychotic medicine is used to control psychotic symptomssuch as delusions, paranoia, and hallucinations. Mood stabilizers can even the highs and lows of bipolar manic mood swings. Antidepressant medicines are used to treat major depressive symptoms.  Counseling or talk therapy--Individual, group, or family counseling may be helpful in providing education, support, and guidance. Many people with ScAD also benefit from social skills and job skills (vocational) training. A combination of medicine and counseling is usually best for managing the disorder over time. A procedure in which electricity is applied to the brain through the scalp (electroconvulsive therapy) may be used to treat people with severe manic symptoms that do not respond to medicine and counseling. HOME CARE INSTRUCTIONS   Take all your medicine as prescribed.  Check with your health care provider before starting new prescription or over-the-counter medicines.  Keep all follow up appointments with your health care provider. SEEK MEDICAL CARE IF:   If you are not able to take your medicines as prescribed.  If your symptoms get worse. SEEK IMMEDIATE MEDICAL CARE IF:   You have serious thoughts about hurting yourself or others. Document Released: 06/25/2006 Document Revised: 12/03/2012 Document Reviewed: 09/26/2012 [facility] Patient Information 2015 [place], [place]. This information is not intended to replace advice given to you by your health care provider. Make sure you discuss any questions you have with your health care provider.

## 2013-09-03 NOTE — Consult Note (Signed)
[place] [facility] Consult   Reason for Consult:  Schizoaffective Disorder Referring Physician:  EDP  [name] is an 23 y.o. male. Total Time spent with patient: 45 minutes  Assessment: AXIS I:  Schizoaffective Disorder AXIS II:  Deferred AXIS III:   Past Medical History  Diagnosis Date  . Asthma   . Bipolar 1 disorder   . ODD (oppositional defiant disorder)   . ADHD (attention deficit hyperactivity disorder)   . Schizophrenia    AXIS IV:  other psychosocial or environmental problems AXIS V:  61-70 mild symptoms  Plan:  No evidence of imminent risk to self or others at present.   Patient does not meet criteria for psychiatric inpatient admission. Supportive therapy provided about ongoing stressors. Discussed crisis plan, support from social network, calling 911, coming to the Emergency Department, and calling Suicide Hotline.  Subjective:   [name] is a 23 y.o. male patient.  HPI:  Patient states "I was sent here from the other hospital.  Patient states that he just got out of [place] "but I don't like my medicine.  But I was thinking these are the ones that is working.  I got a appointment Monday the 12th with my doctor."  Patient states that now he does not want to changes his medications; Patient denies suicidal/homicidal ideation, psychosis, and paranoia.  Patient also states that his mother keeps up with his medicine and his doctor appointments.   Spoke with [name] (mother of patient) "He had came to the hospital cause he said his medicine was making him dizzy and wanted to get it changed."  Informed mother that patient no longer wanted to change his medicine and that he was denying auditory/visual hallucinations, suicidal/homicidal ideation, and paranoia and our plans were to discharge him home to follow up with his primary psych provider; Mother was in agreement and could pick him up in an hour. HPI Elements:   Location:  Medication  adjustment. Quality:  wanting to change medications. Severity:  wanting to change medications. Timing:  3 days. Review of Systems  Musculoskeletal: Negative.   Neurological: Negative for headaches.  Psychiatric/Behavioral: Negative for depression (Denies), suicidal ideas (Denies), hallucinations (Denies), memory loss (Denies) and substance abuse (Denies). The patient is not nervous/anxious (Denies) and does not have insomnia (Denies).     History reviewed. No pertinent family history.  Past Psychiatric History: Past Medical History  Diagnosis Date  . Asthma   . Bipolar 1 disorder   . ODD (oppositional defiant disorder)   . ADHD (attention deficit hyperactivity disorder)   . Schizophrenia     reports that he has never smoked. He does not have any smokeless tobacco history on file. He reports that he drinks alcohol. He reports that he does not use illicit drugs. History reviewed. No pertinent family history. Family History Substance Abuse: No Family Supports: Yes, List: Living Arrangements: Parent;Other relatives Can pt return to current living arrangement?: Yes Abuse/Neglect [facility]) Physical Abuse: Denies Verbal Abuse: Denies Sexual Abuse: Denies Allergies:  No Known Allergies  ACT Assessment Complete:  Yes:    Educational Status    Risk to Self: Risk to self Suicidal Ideation: Yes-Currently Present Suicidal Intent: Yes-Currently Present Is patient at risk for suicide?: Yes Suicidal Plan?: Yes-Currently Present Specify Current Suicidal Plan:  (choke self (during assmnt pt ties sheet around neck)) Access to Means: Yes Specify Access to Suicidal Means:  (access to sheets) What has been your use of drugs/alcohol within the last 12 months?:  (patient  denies alcohol and drug use) Previous Attempts/Gestures: Yes How many times?:  (Patient reports multiple prior attempts) Other Self Harm Risks:  (none reported ) Triggers for Past Attempts: Hallucinations Intentional Self  Injurious Behavior: Cutting;Damaging (choking self ) Family Suicide History: Unknown Recent stressful life event(s): Other (Comment) ("My medications make me feel dizzy") Persecutory voices/beliefs?: No Depression: Yes Depression Symptoms: Feeling angry/irritable;Feeling worthless/self pity;Loss of interest in usual pleasures;Guilt;Fatigue;Isolating;Tearfulness;Despondent;Insomnia Substance abuse history and/or treatment for substance abuse?:  (negative UDS, negative ETOH) Suicide prevention information given to non-admitted patients: Not applicable  Risk to Others: Risk to Others Homicidal Ideation: No Thoughts of Harm to Others: No Comment - Thoughts of Harm to Others:  (n/a) Current Homicidal Intent: No Current Homicidal Plan: No Access to Homicidal Means: No Describe Access to Homicidal Means:  (n/a) Identified Victim:  (n/a) History of harm to others?: No Assessment of Violence: None Noted Violent Behavior Description:  (patient is calm and cooperative ) Does patient have access to weapons?: No Criminal Charges Pending?: No Does patient have a court date: No  Abuse: Abuse/Neglect Assessment (Assessment to be complete while patient is alone) Physical Abuse: Denies Verbal Abuse: Denies Sexual Abuse: Denies Exploitation of patient/patient's resources: Denies Self-Neglect: Denies  Prior Inpatient Therapy: Prior Inpatient Therapy Prior Inpatient Therapy: Yes Prior Therapy Dates: Multiple (multiple prevous admissions; discharged ) Prior Therapy Facilty/Provider(s): Facilities in [place] Reason for Treatment: psychosis  Prior Outpatient Therapy: Prior Outpatient Therapy Prior Outpatient Therapy: Yes Prior Therapy Dates: Last 3 months Prior Therapy Facilty/Provider(s): [facility] Reason for Treatment: med managment & therapy  Additional Information: Additional Information 1:1 In Past 12 Months?: Yes CIRT Risk: No Elopement Risk: No Does patient have medical clearance?: Yes     Objective: Blood pressure 98/62, pulse 87, temperature 97.6 F (36.4 C), temperature source Oral, resp. rate 18, SpO2 97.00%.There is no weight on file to calculate BMI. Results for orders placed during the hospital encounter of 09/02/13 (from the past 72 hour(s))  CBC     Status: None   Collection Time    09/02/13 11:04 AM      Result Value Ref Range   WBC 4.2  4.0 - 10.5 K/uL   RBC 5.40  4.22 - 5.81 MIL/uL   Hemoglobin 14.9  13.0 - 17.0 g/dL   HCT 43.1  39.0 - 52.0 %   MCV 79.8  78.0 - 100.0 fL   MCH 27.6  26.0 - 34.0 pg   MCHC 34.6  30.0 - 36.0 g/dL   RDW 12.7  11.5 - 15.5 %   Platelets 294  150 - 400 K/uL  COMPREHENSIVE METABOLIC PANEL     Status: Abnormal   Collection Time    09/02/13 11:04 AM      Result Value Ref Range   Sodium 141  137 - 147 mEq/L   Potassium 4.5  3.7 - 5.3 mEq/L   Chloride 100  96 - 112 mEq/L   CO2 28  19 - 32 mEq/L   Glucose, Bld 95  70 - 99 mg/dL   BUN 9  6 - 23 mg/dL   Creatinine, Ser 0.93  0.50 - 1.35 mg/dL   Calcium 9.3  8.4 - 10.5 mg/dL   Total Protein 8.0  6.0 - 8.3 g/dL   Albumin 4.5  3.5 - 5.2 g/dL   AST 22  0 - 37 U/L   Comment: HEMOLYSIS AT THIS LEVEL MAY AFFECT RESULT   ALT 21  0 - 53 U/L   Alkaline Phosphatase 77  39 - 117 U/L   Total Bilirubin <0.2 (*) 0.3 - 1.2 mg/dL   GFR calc non Af Amer >90  >90 mL/min   GFR calc Af Amer >90  >90 mL/min   Comment: (NOTE)     The eGFR has been calculated using the CKD EPI equation.     This calculation has not been validated in all clinical situations.     eGFR's persistently <90 mL/min signify possible Chronic Kidney     Disease.   Anion gap 13  5 - 15  ETHANOL     Status: None   Collection Time    09/02/13 11:04 AM      Result Value Ref Range   Alcohol, Ethyl (B) <11  0 - 11 mg/dL   Comment:            LOWEST DETECTABLE LIMIT FOR     SERUM ALCOHOL IS 11 mg/dL     FOR MEDICAL PURPOSES ONLY  ACETAMINOPHEN LEVEL     Status: None   Collection Time    09/02/13 11:04 AM      Result  Value Ref Range   Acetaminophen (Tylenol), Serum <15.0  10 - 30 ug/mL   Comment:            THERAPEUTIC CONCENTRATIONS VARY     SIGNIFICANTLY. A RANGE OF 10-30     ug/mL MAY BE AN EFFECTIVE     CONCENTRATION FOR MANY PATIENTS.     HOWEVER, SOME ARE BEST TREATED     AT CONCENTRATIONS OUTSIDE THIS     RANGE.     ACETAMINOPHEN CONCENTRATIONS     >150 ug/mL AT 4 HOURS AFTER     INGESTION AND >50 ug/mL AT 12     HOURS AFTER INGESTION ARE     OFTEN ASSOCIATED WITH TOXIC     REACTIONS.  SALICYLATE LEVEL     Status: Abnormal   Collection Time    09/02/13 11:04 AM      Result Value Ref Range   Salicylate Lvl <7.5 (*) 2.8 - 20.0 mg/dL  URINE RAPID DRUG SCREEN (HOSP PERFORMED)     Status: None   Collection Time    09/02/13  4:04 PM      Result Value Ref Range   Opiates NONE DETECTED  NONE DETECTED   Cocaine NONE DETECTED  NONE DETECTED   Benzodiazepines NONE DETECTED  NONE DETECTED   Amphetamines NONE DETECTED  NONE DETECTED   Tetrahydrocannabinol NONE DETECTED  NONE DETECTED   Barbiturates NONE DETECTED  NONE DETECTED   Comment:            DRUG SCREEN FOR MEDICAL PURPOSES     ONLY.  IF CONFIRMATION IS NEEDED     FOR ANY PURPOSE, NOTIFY LAB     WITHIN 5 DAYS.                LOWEST DETECTABLE LIMITS     FOR URINE DRUG SCREEN     Drug Class       Cutoff (ng/mL)     Amphetamine      1000     Barbiturate      200     Benzodiazepine   170     Tricyclics       017     Opiates          300     Cocaine          300     THC  50   Labs are reviewed see above values.  Medications reviewed and no changes made.  Current Facility-Administered Medications  Medication Dose Route Frequency Provider Last Rate Last Dose  . alum & mag hydroxide-simeth (MAALOX/MYLANTA) 200-200-20 MG/5ML suspension 30 mL  30 mL Oral PRN Illene Labrador, PA-C      . benztropine (COGENTIN) tablet 1 mg  1 mg Oral QHS Illene Labrador, PA-C   1 mg at 09/02/13 2233  . carbamazepine (TEGRETOL XR) 12 hr tablet  200 mg  200 mg Oral BID Illene Labrador, PA-C   200 mg at 09/03/13 7425  . haloperidol (HALDOL) tablet 10 mg  10 mg Oral QHS Illene Labrador, PA-C   10 mg at 09/02/13 1419  . ibuprofen (ADVIL,MOTRIN) tablet 600 mg  600 mg Oral Q8H PRN Illene Labrador, PA-C   600 mg at 09/02/13 2240  . LORazepam (ATIVAN) tablet 1 mg  1 mg Oral Q8H PRN Illene Labrador, PA-C   1 mg at 09/03/13 0910  . nicotine (NICODERM CQ - dosed in mg/24 hours) patch 21 mg  21 mg Transdermal Daily Robyn M Albert, PA-C      . ondansetron [facility]) tablet 4 mg  4 mg Oral Q8H PRN Illene Labrador, PA-C      . traZODone (DESYREL) tablet 150 mg  150 mg Oral QHS Illene Labrador, PA-C   150 mg at 09/02/13 2233  . zolpidem (AMBIEN) tablet 5 mg  5 mg Oral QHS PRN Illene Labrador, PA-C       Current Outpatient Prescriptions  Medication Sig Dispense Refill  . benztropine (COGENTIN) 1 MG tablet Take 1 tablet (1 mg total) by mouth at bedtime.  30 tablet  0  . carbamazepine (TEGRETOL XR) 200 MG 12 hr tablet Take 1 tablet (200 mg total) by mouth 2 (two) times daily.  60 tablet  0  . haloperidol (HALDOL) 10 MG tablet Take 1 tablet (10 mg total) by mouth at bedtime.  30 tablet  0  . traZODone (DESYREL) 150 MG tablet Take 1 tablet (150 mg total) by mouth at bedtime.  30 tablet  0    Psychiatric Specialty Exam:     Blood pressure 98/62, pulse 87, temperature 97.6 F (36.4 C), temperature source Oral, resp. rate 18, SpO2 97.00%.There is no weight on file to calculate BMI.  General Appearance: Casual  Eye Contact::  Good  Speech:  Clear and Coherent and Normal Rate  Volume:  Normal  Mood:  I'm happy; feed good"  Affect:  Congruent  Thought Process:  Circumstantial and Goal Directed  Orientation:  Full (Time, Place, and Person)  Thought Content:  Rumination  Suicidal Thoughts:  No  Homicidal Thoughts:  No  Memory:  Immediate;   Good Recent;   Good Remote;   Good  Judgement:  Fair  Insight:  Present  Psychomotor Activity:  Normal   Concentration:  Fair  Recall:  Good  Fund of Knowledge:Fair  Language: Fair  Akathisia:  No  Handed:  Right  AIMS (if indicated):     Assets:  Communication Skills Desire for Improvement Housing Social Support Transportation  Sleep:      Musculoskeletal: Strength & Muscle Tone: within normal limits Gait & Station: normal Patient leans: N/A  Treatment Plan Summary:   Discharge home; Mother (guardian) will pick up.  Patient to follow up with primary psych provider (Keep scheduled appointment)     Earleen Newport, FNP-BC 09/03/2013 10:51 AM

## 2013-09-03 NOTE — Progress Notes (Signed)
[facility] provided pt with a [employer] Orange Card application, highlighting [facility], to help patient establish primary care.  °

## 2013-09-23 ENCOUNTER — Encounter (HOSPITAL_COMMUNITY): Payer: Self-pay | Admitting: Emergency Medicine

## 2013-09-23 ENCOUNTER — Emergency Department (HOSPITAL_COMMUNITY)
Admission: EM | Admit: 2013-09-23 | Discharge: 2013-09-24 | Disposition: A | Discharge status: Home / Self Care | Payer: Medicaid Other | Attending: Emergency Medicine | Admitting: Emergency Medicine

## 2013-09-23 DIAGNOSIS — R4689 Other symptoms and signs involving appearance and behavior: Secondary | ICD-10-CM

## 2013-09-23 DIAGNOSIS — F911 Conduct disorder, childhood-onset type: Secondary | ICD-10-CM | POA: Diagnosis not present

## 2013-09-23 DIAGNOSIS — F4324 Adjustment disorder with disturbance of conduct: Secondary | ICD-10-CM

## 2013-09-23 DIAGNOSIS — F319 Bipolar disorder, unspecified: Secondary | ICD-10-CM | POA: Insufficient documentation

## 2013-09-23 DIAGNOSIS — Z79899 Other long term (current) drug therapy: Secondary | ICD-10-CM | POA: Insufficient documentation

## 2013-09-23 DIAGNOSIS — R45851 Suicidal ideations: Secondary | ICD-10-CM | POA: Diagnosis not present

## 2013-09-23 DIAGNOSIS — R45850 Homicidal ideations: Secondary | ICD-10-CM | POA: Diagnosis not present

## 2013-09-23 DIAGNOSIS — F29 Unspecified psychosis not due to a substance or known physiological condition: Secondary | ICD-10-CM | POA: Insufficient documentation

## 2013-09-23 DIAGNOSIS — J45909 Unspecified asthma, uncomplicated: Secondary | ICD-10-CM | POA: Diagnosis not present

## 2013-09-23 DIAGNOSIS — F259 Schizoaffective disorder, unspecified: Secondary | ICD-10-CM | POA: Insufficient documentation

## 2013-09-23 DIAGNOSIS — F23 Brief psychotic disorder: Secondary | ICD-10-CM

## 2013-09-23 LAB — CBC WITH DIFFERENTIAL/PLATELET
BASOS ABS: 0 10*3/uL (ref 0.0–0.1)
BASOS PCT: 0 % (ref 0–1)
EOS ABS: 0.1 10*3/uL (ref 0.0–0.7)
Eosinophils Relative: 1 % (ref 0–5)
HCT: 46.3 % (ref 39.0–52.0)
Hemoglobin: 16.2 g/dL (ref 13.0–17.0)
Lymphocytes Relative: 45 % (ref 12–46)
Lymphs Abs: 1.9 10*3/uL (ref 0.7–4.0)
MCH: 27.6 pg (ref 26.0–34.0)
MCHC: 35 g/dL (ref 30.0–36.0)
MCV: 78.9 fL (ref 78.0–100.0)
Monocytes Absolute: 0.4 10*3/uL (ref 0.1–1.0)
Monocytes Relative: 10 % (ref 3–12)
NEUTROS ABS: 1.8 10*3/uL (ref 1.7–7.7)
NEUTROS PCT: 44 % (ref 43–77)
PLATELETS: 294 10*3/uL (ref 150–400)
RBC: 5.87 MIL/uL — ABNORMAL HIGH (ref 4.22–5.81)
RDW: 12.5 % (ref 11.5–15.5)
WBC: 4.2 10*3/uL (ref 4.0–10.5)

## 2013-09-23 LAB — COMPREHENSIVE METABOLIC PANEL
ALBUMIN: 4.4 g/dL (ref 3.5–5.2)
ALK PHOS: 88 U/L (ref 39–117)
ALT: 28 U/L (ref 0–53)
AST: 21 U/L (ref 0–37)
Anion gap: 14 (ref 5–15)
BILIRUBIN TOTAL: 0.4 mg/dL (ref 0.3–1.2)
BUN: 11 mg/dL (ref 6–23)
CHLORIDE: 102 meq/L (ref 96–112)
CO2: 26 mEq/L (ref 19–32)
Calcium: 10.4 mg/dL (ref 8.4–10.5)
Creatinine, Ser: 0.92 mg/dL (ref 0.50–1.35)
GFR calc Af Amer: >90 mL/min (ref >90)
GFR calc non Af Amer: >90 mL/min (ref >90)
Glucose, Bld: 104 mg/dL — ABNORMAL HIGH (ref 70–99)
POTASSIUM: 4.2 meq/L (ref 3.7–5.3)
SODIUM: 142 meq/L (ref 137–147)
Total Protein: 8.7 g/dL — ABNORMAL HIGH (ref 6.0–8.3)

## 2013-09-23 LAB — ETHANOL: Alcohol, Ethyl (B): <11 mg/dL (ref 0–11)

## 2013-09-23 LAB — ACETAMINOPHEN LEVEL: Acetaminophen (Tylenol), Serum: <15 ug/mL (ref 10–30)

## 2013-09-23 LAB — SALICYLATE LEVEL: Salicylate Lvl: <2 mg/dL — ABNORMAL LOW (ref 2.8–20.0)

## 2013-09-23 MED ORDER — BENZTROPINE MESYLATE 1 MG/ML IJ SOLN
2.0000 mg | Freq: Once | INTRAMUSCULAR | Status: AC
  Administered 2013-09-23: 1 mg via INTRAMUSCULAR
  Filled 2013-09-23: qty 2

## 2013-09-23 MED ORDER — CARBAMAZEPINE ER 100 MG PO TB12
100.0000 mg | ORAL_TABLET | Freq: Two times a day (BID) | ORAL | Status: DC
  Administered 2013-09-23 – 2013-09-24 (×2): 100 mg via ORAL
  Filled 2013-09-23 (×4): qty 1

## 2013-09-23 MED ORDER — ZIPRASIDONE MESYLATE 20 MG IM SOLR
INTRAMUSCULAR | Status: AC
  Filled 2013-09-23: qty 20

## 2013-09-23 MED ORDER — FLUOXETINE HCL 10 MG PO CAPS
10.0000 mg | ORAL_CAPSULE | Freq: Every day | ORAL | Status: DC
  Administered 2013-09-23 – 2013-09-24 (×2): 10 mg via ORAL
  Filled 2013-09-23 (×3): qty 1

## 2013-09-23 MED ORDER — TRAZODONE HCL 50 MG PO TABS
150.0000 mg | ORAL_TABLET | Freq: Every day | ORAL | Status: DC
  Filled 2013-09-23 (×2): qty 1

## 2013-09-23 MED ORDER — HALOPERIDOL LACTATE 5 MG/ML IJ SOLN
INTRAMUSCULAR | Status: AC
  Filled 2013-09-23: qty 2

## 2013-09-23 MED ORDER — BENZTROPINE MESYLATE 1 MG PO TABS
1.0000 mg | ORAL_TABLET | Freq: Every day | ORAL | Status: DC
  Filled 2013-09-23: qty 1

## 2013-09-23 MED ORDER — CHLORPROMAZINE HCL 25 MG PO TABS
100.0000 mg | ORAL_TABLET | Freq: Two times a day (BID) | ORAL | Status: DC
  Administered 2013-09-23 – 2013-09-24 (×2): 100 mg via ORAL
  Filled 2013-09-23 (×2): qty 4

## 2013-09-23 MED ORDER — DIPHENHYDRAMINE HCL 50 MG/ML IJ SOLN
50.0000 mg | Freq: Once | INTRAMUSCULAR | Status: AC
  Administered 2013-09-23: 50 mg via INTRAMUSCULAR

## 2013-09-23 MED ORDER — HALOPERIDOL 5 MG PO TABS
10.0000 mg | ORAL_TABLET | Freq: Every day | ORAL | Status: DC
  Filled 2013-09-23: qty 2

## 2013-09-23 MED ORDER — HALOPERIDOL LACTATE 5 MG/ML IJ SOLN
10.0000 mg | Freq: Once | INTRAMUSCULAR | Status: AC
  Administered 2013-09-23: 10 mg via INTRAMUSCULAR

## 2013-09-23 MED ORDER — DIPHENHYDRAMINE HCL 50 MG/ML IJ SOLN
INTRAMUSCULAR | Status: AC
  Administered 2013-09-23: 50 mg via INTRAMUSCULAR
  Filled 2013-09-23: qty 1

## 2013-09-23 NOTE — ED Notes (Signed)
Bed: [facility] Expected date:  Expected time:  Means of arrival:  Comments: EMS- 23yo, SI/HI/Hallucinations

## 2013-09-23 NOTE — ED Notes (Signed)
Pt having tongue, MD notified, cogentin ordered

## 2013-09-23 NOTE — ED Notes (Signed)
Pt states that he is hearing voices telling him to hurt himself and hurt others.  States that his voices are telling him not to take his medications.  Called 911 today.  Old scars on arms from self harm.

## 2013-09-23 NOTE — Consult Note (Addendum)
[name] Face-to-Face Psychiatry Consult   Reason for Consult:  Hearing voices and not taking his medications Referring Physician:  ER MD  [name] is an 23 y.o. male. Total Time spent with patient: 30 minutes  Assessment: AXIS I:  Schizoaffective Disorder AXIS II:  Deferred AXIS III:   Past Medical History  Diagnosis Date  . Asthma   . Bipolar 1 disorder   . ODD (oppositional defiant disorder)   . ADHD (attention deficit hyperactivity disorder)   . Schizophrenia    AXIS IV:  chronic mental illness and boredom AXIS V:  61-70 mild symptoms  Plan:  No evidence of imminent risk to self or others at present.    Subjective:   [name] is a 23 y.o. male patient admitted with bizarre behaviors, hearing voices and not taking his medications.  HPI:  [name] is a regular visitor to the ER with the same kind of bizarre behaviors as today.  They do not seem to be schizophrenic but appear to be more playing with staff, challenging them with things such as trying to swallow latex gloves, stabbing himself with a drinking straw, choking himself in front of staff.  He seems to enjoy doing these things as opposed to responding to voices.  He just as quickly turns it off and says "I am okay now".  Inpatient stayshave not made much difference in the course of his behavior.  He has nothing to do all day and says he plays video games all day.  He seems to get bored and gets himself to the ER though he would not agree with that assessment, probably.  He says he has not been taking his medication "but now I will".      HPI Elements:   Location:  hallucinations. Quality:  annoying. Severity:  telling him scary things. Timing:  no precipitants. Duration:  several years at least. Context:  as above.  Past Psychiatric History: Past Medical History  Diagnosis Date  . Asthma   . Bipolar 1 disorder   . ODD (oppositional defiant disorder)   . ADHD (attention deficit hyperactivity disorder)    . Schizophrenia     reports that he has never smoked. He does not have any smokeless tobacco history on file. He reports that he drinks alcohol. He reports that he does not use illicit drugs. No family history on file.         Allergies:  No Known Allergies  ACT Assessment Complete:  Yes:    Educational Status    Risk to Self: Risk to self with the past 6 months Is patient at risk for suicide?: Yes Substance abuse history and/or treatment for substance abuse?: No  Risk to Others:    Abuse:    Prior Inpatient Therapy:    Prior Outpatient Therapy:    Additional Information:                    Objective: Blood pressure 150/84, pulse 105, temperature 99 F (37.2 C), temperature source Oral, resp. rate 18, SpO2 99.00%.There is no weight on file to calculate BMI. Results for orders placed during the hospital encounter of 09/23/13 (from the past 72 hour(s))  CBC WITH DIFFERENTIAL     Status: Abnormal   Collection Time    09/23/13  1:30 PM      Result Value Ref Range   WBC 4.2  4.0 - 10.5 K/uL   RBC 5.87 (*) 4.22 - 5.81 MIL/uL   Hemoglobin 16.2  13.0 - 17.0 g/dL   HCT 93.8  86.3 - 81.5 %   MCV 78.9  78.0 - 100.0 fL   MCH 27.6  26.0 - 34.0 pg   MCHC 35.0  30.0 - 36.0 g/dL   RDW 16.5  57.1 - 32.2 %   Platelets 294  150 - 400 K/uL   Neutrophils Relative % 44  43 - 77 %   Neutro Abs 1.8  1.7 - 7.7 K/uL   Lymphocytes Relative 45  12 - 46 %   Lymphs Abs 1.9  0.7 - 4.0 K/uL   Monocytes Relative 10  3 - 12 %   Monocytes Absolute 0.4  0.1 - 1.0 K/uL   Eosinophils Relative 1  0 - 5 %   Eosinophils Absolute 0.1  0.0 - 0.7 K/uL   Basophils Relative 0  0 - 1 %   Basophils Absolute 0.0  0.0 - 0.1 K/uL  COMPREHENSIVE METABOLIC PANEL     Status: Abnormal   Collection Time    09/23/13  1:39 PM      Result Value Ref Range   Sodium 142  137 - 147 mEq/L   Potassium 4.2  3.7 - 5.3 mEq/L   Chloride 102  96 - 112 mEq/L   CO2 26  19 - 32 mEq/L   Glucose, Bld 104 (*) 70 - 99  mg/dL   BUN 11  6 - 23 mg/dL   Creatinine, Ser 7.38  0.50 - 1.35 mg/dL   Calcium 92.6  8.4 - 01.8 mg/dL   Total Protein 8.7 (*) 6.0 - 8.3 g/dL   Albumin 4.4  3.5 - 5.2 g/dL   AST 21  0 - 37 U/L   ALT 28  0 - 53 U/L   Alkaline Phosphatase 88  39 - 117 U/L   Total Bilirubin 0.4  0.3 - 1.2 mg/dL   GFR calc non Af Amer >90  >90 mL/min   GFR calc Af Amer >90  >90 mL/min   Comment: (NOTE)     The eGFR has been calculated using the CKD EPI equation.     This calculation has not been validated in all clinical situations.     eGFR's persistently <90 mL/min signify possible Chronic Kidney     Disease.   Anion gap 14  5 - 15   Labs are reviewed and are pertinent for no psychiatric issue.  Current Facility-Administered Medications  Medication Dose Route Frequency Provider Last Rate Last Dose  . benztropine mesylate (COGENTIN) injection 2 mg  2 mg Intramuscular Once Benjaman Pott, MD       Current Outpatient Prescriptions  Medication Sig Dispense Refill  . carbamazepine (TEGRETOL XR) 100 MG 12 hr tablet Take 100 mg by mouth 2 (two) times daily.      . chlorproMAZINE (THORAZINE) 100 MG tablet Take 100 mg by mouth 2 (two) times daily.      Marland Kitchen FLUoxetine (PROZAC) 10 MG capsule Take 10 mg by mouth daily.      . benztropine (COGENTIN) 1 MG tablet Take 1 tablet (1 mg total) by mouth at bedtime.  30 tablet  0  . haloperidol (HALDOL) 10 MG tablet Take 1 tablet (10 mg total) by mouth at bedtime.  30 tablet  0  . traZODone (DESYREL) 150 MG tablet Take 1 tablet (150 mg total) by mouth at bedtime.  30 tablet  0    Psychiatric Specialty Exam:     Blood pressure 150/84, pulse 105, temperature 99  F (37.2 C), temperature source Oral, resp. rate 18, SpO2 99.00%.There is no weight on file to calculate BMI.  General Appearance: Casual  Eye Contact::  Good  Speech:  currently having a dystonic reaction  Volume:  Normal  Mood:  Euthymic  Affect:  Appropriate  Thought Process:  Coherent  Orientation:   Full (Time, Place, and Person)  Thought Content:  Hallucinations: Auditory  Suicidal Thoughts:  No  Homicidal Thoughts:  No  Memory:  Immediate;   Good Recent;   Good Remote;   Good  Judgement:  Intact  Insight:  Shallow  Psychomotor Activity:  Normal  Concentration:  Good  Recall:  Good  Fund of Knowledge:Good  Language: Good  Akathisia:  Negative  Handed:  Right  AIMS (if indicated):     Assets:  Immunologist Social Support Transportation Vocational/Educational  Sleep:      Musculoskeletal: Strength & Muscle Tone: within normal limits Gait & Station: normal Patient leans: N/A  Treatment Plan Summary: If he continues not suicidal or homicidal after the dystonia is controlled will ikely discharge home  Clarene Reamer 09/23/2013 2:19 PM  [name] Gauger is asleep after taking cogentin and benadryl for his dystonic reaction, so will keep him overnight and reassess in the morning.

## 2013-09-23 NOTE — ED Notes (Signed)
Pt sitting quietly at this time on stretcher, states that his jaw stiffness is improving

## 2013-09-23 NOTE — ED Notes (Addendum)
rn was told pt took a bunch of his pills, rn made pt spit pills out into her gloved hand. Pt spit out 6 chlorpromazine pills fully intact Reports that "kayla" a visual hallucination told him to take the pills because she is the boss.

## 2013-09-23 NOTE — ED Notes (Signed)
Patient calm, cooperative at present. Denies SI, HI, AVH. States he came in to hospital because of AH. Inquired if he will get the medications that he came in with while in the hospital.  Encouragement offered.  Q 15 safety checks in place.

## 2013-09-23 NOTE — ED Notes (Signed)
Pt experiencing jaw and mouth stiffness.

## 2013-09-23 NOTE — ED Provider Notes (Signed)
CSN: [id number]     Arrival date & time 09/23/13  1311 History   First MD Initiated Contact with Patient 09/23/13 1330     Chief Complaint  Patient presents with  . SI   . HI   . Hallucinations     (Consider location/radiation/quality/duration/timing/severity/associated sxs/prior Treatment) HPI 23 year old male with schizophrenia states she is noncompliant with his medications states he is intolerant of Geodon but can take Haldol states the [employer] and FBI urine the ceiling trying to get to him, he states any visible voice of a person named [name] that he can see brothers cannot is telling him to overdose and hurt others, patient is trying to fight the police and security and emergency department, patient grabbed a bunch of his pills and tried overdosing the emergency department and the nurse took the pills away from the patient, patient grabbed an examination glove and tried to shove that down his mouth to choke on it and it was taken away from the patient as well, he states the voice [name] tells him not to take his medications until he was told by her to overdose when he arrived to the emergency department, the patient denies any trauma fever chest pain cough shortness breath abdominal pain vomiting or other concerns. Patient states he has not been taking his psychiatric medications. The patient states he called 911 to come to the emergency department but now wants to leave so he can fight the police and security. The patient also wants to leave the emergency department so he can go buy a shotgun. Past Medical History  Diagnosis Date  . Asthma   . Bipolar 1 disorder   . ODD (oppositional defiant disorder)   . ADHD (attention deficit hyperactivity disorder)   . Schizophrenia    No past surgical history on file. No family history on file. History  Substance Use Topics  . Smoking status: Never Smoker   . Smokeless tobacco: Not on file  . Alcohol Use: Yes    Review of Systems  10 Systems  reviewed and are negative for acute change except as noted in the HPI.  Allergies  Review of patient's allergies indicates no known allergies.  Home Medications   Prior to Admission medications   Medication Sig Start Date End Date Taking? Authorizing Provider  carbamazepine (TEGRETOL XR) 100 MG 12 hr tablet Take 100 mg by mouth 2 (two) times daily.   Yes Historical Provider, MD  chlorproMAZINE (THORAZINE) 100 MG tablet Take 100 mg by mouth 2 (two) times daily.   Yes Historical Provider, MD  FLUoxetine (PROZAC) 10 MG capsule Take 10 mg by mouth daily.   Yes Historical Provider, MD  benztropine (COGENTIN) 1 MG tablet Take 1 tablet (1 mg total) by mouth at bedtime. 08/13/13   Fransisca Kaufmann, NP  haloperidol (HALDOL) 10 MG tablet Take 1 tablet (10 mg total) by mouth at bedtime. 08/13/13   Fransisca Kaufmann, NP  traZODone (DESYREL) 150 MG tablet Take 1 tablet (150 mg total) by mouth at bedtime. 08/13/13   Fransisca Kaufmann, NP   BP 123/63  Pulse 89  Temp(Src) 99 F (37.2 C) (Oral)  Resp 18  SpO2 96% Physical Exam  Nursing note and vitals reviewed. Constitutional: He is oriented to person, place, and time.  Awake, alert, nontoxic appearance.  HENT:  Head: Atraumatic.  Eyes: Right eye exhibits no discharge. Left eye exhibits no discharge.  Neck: Neck supple.  Cardiovascular: Normal rate and regular rhythm.   No murmur heard. Pulmonary/Chest:  Effort normal and breath sounds normal. No respiratory distress. He has no wheezes. He has no rales. He exhibits no tenderness.  Abdominal: Soft. Bowel sounds are normal. He exhibits no distension. There is no tenderness. There is no rebound and no guarding.  Musculoskeletal: He exhibits no tenderness.  Baseline ROM, no obvious new focal weakness.  Neurological: He is alert and oriented to person, place, and time.  Mental status and motor strength appears baseline for patient and situation.  Skin: No rash noted.  Psychiatric:  Patient is oriented to person place  and time but anxious depressed agitated combative trying to choke himself on examination glove also trying to overdose on his medications upon arrival to the ED also trying to fight police and security also trying to leave the ED to go buy a shotgun patient has ongoing suicidal and homicidal ideation with hallucinations of the voice of a person named Dorathy DaftKayla telling him to perform all his behaviors and he also believes the [employer] and [employer] are hiding in the ceiling trying to get into his brain to control him.    ED Course  Procedures (including critical care time) Called by RN staff emergently to see Pt in Triage due to risk of elopement with SI, HI, hallucinations. IVC Emergency Cert and First Exam completed. 1345 He states he is intolerant of Geodon so he was given Haldol since he tolerates that for control of his acute psychosis.1345  CRITICAL CARE Performed by: Hurman HornBEDNAR,Orella Cushman M Total critical care time: 40min including assess Pt and initiate IVC. Critical care time was exclusive of separately billable procedures and treating other patients. Critical care was necessary to treat or prevent imminent or life-threatening deterioration. Critical care was time spent personally by me on the following activities: development of treatment plan with patient and/or surrogate as well as nursing, discussions with consultants, evaluation of patient's response to treatment, examination of patient, obtaining history from patient or surrogate, ordering and performing treatments and interventions, ordering and review of laboratory studies, ordering and review of radiographic studies, pulse oximetry and re-evaluation of patient's condition.  Psychiatrist interviewed Pt in ED and may rescind IVC since Pt has pattern of similar behavior and [facility] and returned to ED within 2 days per records as one of prior recent ED visits. Pt has had several ED visits within last few months for behavioral  issues.  Labs Review Labs Reviewed  CBC WITH DIFFERENTIAL - Abnormal; Notable for the following:    RBC 5.87 (*)    All other components within normal limits  COMPREHENSIVE METABOLIC PANEL - Abnormal; Notable for the following:    Glucose, Bld 104 (*)    Total Protein 8.7 (*)    All other components within normal limits  SALICYLATE LEVEL - Abnormal; Notable for the following:    Salicylate Lvl <2.0 (*)    All other components within normal limits  ETHANOL  ACETAMINOPHEN LEVEL  URINE RAPID DRUG SCREEN (HOSP PERFORMED)  URINALYSIS, ROUTINE W REFLEX MICROSCOPIC    Imaging Review No results found.   EKG Interpretation None      MDM   Final diagnoses:  Acute psychosis  Aggression  Schizoaffective disorder, unspecified type  Suicidal ideation  Homicidal ideation    Dispo pnd.    Hurman HornJohn M Burnette Sautter, MD 09/23/13 2219

## 2013-09-23 NOTE — ED Notes (Signed)
While staff was in room administering IM Benadryl per order, pt kept moving his hand towards the writer's vaginal region, asked pt to move hand, pt needs frequent redirection, c/o dizziness explained to pt that he needs to remain on stretcher, side rails placed in upright position, oriented pt to call bell system, pt moves siderail down every time staff moves it in upright position, call bell within reach, stretcher locked.

## 2013-09-23 NOTE — ED Notes (Signed)
Pt continuing to try and get up off of stretcher despite staff instruction to stay sitting

## 2013-09-24 DIAGNOSIS — F4324 Adjustment disorder with disturbance of conduct: Secondary | ICD-10-CM

## 2013-09-24 LAB — URINALYSIS, ROUTINE W REFLEX MICROSCOPIC
BILIRUBIN URINE: NEGATIVE
Glucose, UA: NEGATIVE mg/dL
Hgb urine dipstick: NEGATIVE
KETONES UR: NEGATIVE mg/dL
LEUKOCYTES UA: NEGATIVE
Nitrite: NEGATIVE
PH: 5.5 (ref 5.0–8.0)
Protein, ur: NEGATIVE mg/dL
Specific Gravity, Urine: 1.021 (ref 1.005–1.030)
UROBILINOGEN UA: 1 mg/dL (ref 0.0–1.0)

## 2013-09-24 LAB — RAPID URINE DRUG SCREEN, HOSP PERFORMED
Amphetamines: NOT DETECTED
BARBITURATES: NOT DETECTED
Benzodiazepines: NOT DETECTED
COCAINE: NOT DETECTED
Opiates: NOT DETECTED
TETRAHYDROCANNABINOL: NOT DETECTED

## 2013-09-24 NOTE — Consult Note (Signed)
  Psychiatric Specialty Exam: Physical Exam  ROS  Blood pressure 121/64, pulse 93, temperature 98.4 F (36.9 C), temperature source Oral, resp. rate 18, SpO2 98.00%.There is no weight on file to calculate BMI.  General Appearance: Well Groomed  Patent attorneyye Contact::  Good  Speech:  Clear and Coherent  Volume:  Normal  Mood:  Euthymic  Affect:  Appropriate  Thought Process:  Coherent and Logical  Orientation:  Full (Time, Place, and Person)  Thought Content:  Negative  Suicidal Thoughts:  No  Homicidal Thoughts:  No  Memory:  Immediate;   Good Recent;   Good Remote;   Good  Judgement:  Intact  Insight:  Fair  Psychomotor Activity:  Normal  Concentration:  Good  Recall:  Good  Akathisia:  Negative  Handed:  Right  AIMS (if indicated):     Assets:  Communication Skills Housing Social Support  Sleep:   good  [name] is fine this morning. No psychosis,no suicidal or homicidal thoughts.  Says "I was just kidding" when asked about his shenanigans yesterday.  Based on his past history with the ER he can come right back with equally outrageous behavior, seemingly out of boredom if he chooses.  He wants to go home.Discharge home today.

## 2013-09-24 NOTE — [facility] Counselor (Signed)
Dr Ladona Ridgelaylor rescinded pt's IVC. [facility] and original placed in [facility] IVC NB.  Evette Cristalaroline Paige Kolby Schara, [place] Assessment Counselor

## 2013-09-24 NOTE — [facility] Suicide Risk Assessment (Signed)
Suicide Risk Assessment  Discharge Assessment     Demographic Factors:  Male and Adolescent or young adult  Total Time spent with patient: 20 minutes  Psychiatric Specialty Exam:     Blood pressure 121/64, pulse 93, temperature 98.4 F (36.9 C), temperature source Oral, resp. rate 18, SpO2 98.00%.There is no weight on file to calculate BMI.  General Appearance: Well Groomed  Patent attorneyye Contact::  Good  Speech:  Clear and Coherent  Volume:  Normal  Mood:  Euthymic  Affect:  Appropriate  Thought Process:  Coherent and Logical  Orientation:  Full (Time, Place, and Person)  Thought Content:  Negative  Suicidal Thoughts:  No  Homicidal Thoughts:  No  Memory:  Immediate;   Good Recent;   Good Remote;   Good  Judgement:  Intact  Insight:  Fair  Psychomotor Activity:  Normal  Concentration:  Good  Recall:  Good  Fund of Knowledge:Good  Language: Good  Akathisia:  Negative  Handed:  Right  AIMS (if indicated):     Assets:  ArchitectCommunication Skills Financial Resources/Insurance Housing Social Support  Sleep:       Musculoskeletal: Strength & Muscle Tone: within normal limits Gait & Station: normal Patient leans: N/A   Mental Status Per Nursing Assessment::   On Admission:     Current Mental Status by Physician: NA  Loss Factors: NA  Historical Factors: NA  Risk Reduction Factors:   NA  Continued Clinical Symptoms:  Previous Psychiatric Diagnoses and Treatments  Cognitive Features That Contribute To Risk:  Closed-mindedness    Suicide Risk:  Minimal: No identifiable suicidal ideation.  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 Asthma   . Bipolar 1 disorder   . ODD (oppositional defiant disorder)    . ADHD (attention deficit hyperactivity disorder)   . Schizophrenia    AXIS IV:  chronic boredom and misbehavior AXIS V:  mild  Plan Of Care/Follow-up recommendations:  Activity:  resume usual activity Diet:  reasume usual diet  Is patient on multiple antipsychotic therapies at discharge:  No   Has Patient had three or more failed trials of antipsychotic monotherapy by history:  No  Recommended Plan for Multiple Antipsychotic Therapies: NA    TAYLOR,GERALD D 09/24/2013, 9:55 AM

## 2013-10-04 ENCOUNTER — Emergency Department (HOSPITAL_COMMUNITY)
Admission: EM | Admit: 2013-10-04 | Discharge: 2013-10-04 | Disposition: A | Discharge status: Home / Self Care | Payer: Federal, State, Local not specified - Other | Attending: Emergency Medicine | Admitting: Emergency Medicine

## 2013-10-04 ENCOUNTER — Encounter (HOSPITAL_COMMUNITY): Payer: Self-pay

## 2013-10-04 DIAGNOSIS — Z79899 Other long term (current) drug therapy: Secondary | ICD-10-CM | POA: Diagnosis not present

## 2013-10-04 DIAGNOSIS — J45909 Unspecified asthma, uncomplicated: Secondary | ICD-10-CM | POA: Insufficient documentation

## 2013-10-04 DIAGNOSIS — R45851 Suicidal ideations: Secondary | ICD-10-CM | POA: Insufficient documentation

## 2013-10-04 DIAGNOSIS — F209 Schizophrenia, unspecified: Secondary | ICD-10-CM | POA: Diagnosis not present

## 2013-10-04 DIAGNOSIS — F203 Undifferentiated schizophrenia: Secondary | ICD-10-CM

## 2013-10-04 HISTORY — DX: Homicidal ideations: R45.850

## 2013-10-04 HISTORY — DX: Borderline personality disorder: F60.3

## 2013-10-04 HISTORY — DX: Suicidal ideations: R45.851

## 2013-10-04 LAB — COMPREHENSIVE METABOLIC PANEL
ALBUMIN: 4.3 g/dL (ref 3.5–5.2)
ALK PHOS: 78 U/L (ref 39–117)
ALT: 22 U/L (ref 0–53)
AST: 23 U/L (ref 0–37)
Anion gap: 13 (ref 5–15)
BUN: 6 mg/dL (ref 6–23)
CO2: 27 mEq/L (ref 19–32)
Calcium: 9.3 mg/dL (ref 8.4–10.5)
Chloride: 101 mEq/L (ref 96–112)
Creatinine, Ser: 1.12 mg/dL (ref 0.50–1.35)
GFR calc non Af Amer: >90 mL/min (ref >90)
Glucose, Bld: 94 mg/dL (ref 70–99)
POTASSIUM: 4.2 meq/L (ref 3.7–5.3)
SODIUM: 141 meq/L (ref 137–147)
TOTAL PROTEIN: 8 g/dL (ref 6.0–8.3)
Total Bilirubin: 0.3 mg/dL (ref 0.3–1.2)

## 2013-10-04 LAB — ETHANOL: Alcohol, Ethyl (B): <11 mg/dL (ref 0–11)

## 2013-10-04 LAB — CBC
HCT: 45.7 % (ref 39.0–52.0)
HEMOGLOBIN: 15.9 g/dL (ref 13.0–17.0)
MCH: 27.5 pg (ref 26.0–34.0)
MCHC: 34.8 g/dL (ref 30.0–36.0)
MCV: 79.1 fL (ref 78.0–100.0)
Platelets: 334 10*3/uL (ref 150–400)
RBC: 5.78 MIL/uL (ref 4.22–5.81)
RDW: 12.9 % (ref 11.5–15.5)
WBC: 5.9 10*3/uL (ref 4.0–10.5)

## 2013-10-04 LAB — RAPID URINE DRUG SCREEN, HOSP PERFORMED
Amphetamines: NOT DETECTED
Barbiturates: NOT DETECTED
Benzodiazepines: NOT DETECTED
COCAINE: NOT DETECTED
OPIATES: NOT DETECTED
TETRAHYDROCANNABINOL: NOT DETECTED

## 2013-10-04 LAB — ACETAMINOPHEN LEVEL: Acetaminophen (Tylenol), Serum: <15 ug/mL (ref 10–30)

## 2013-10-04 MED ORDER — DIPHENHYDRAMINE HCL 25 MG PO CAPS
50.0000 mg | ORAL_CAPSULE | Freq: Two times a day (BID) | ORAL | Status: DC
  Administered 2013-10-04: 50 mg via ORAL
  Filled 2013-10-04: qty 2

## 2013-10-04 MED ORDER — TRAZODONE HCL 50 MG PO TABS: 150.0000 mg | ORAL_TABLET | Freq: Every day | ORAL | Status: DC

## 2013-10-04 MED ORDER — IBUPROFEN 400 MG PO TABS: 600.0000 mg | ORAL_TABLET | Freq: Three times a day (TID) | ORAL | Status: DC | PRN

## 2013-10-04 MED ORDER — NICOTINE 21 MG/24HR TD PT24: 21.0000 mg | MEDICATED_PATCH | Freq: Every day | TRANSDERMAL | Status: DC | PRN

## 2013-10-04 MED ORDER — FLUOXETINE HCL 10 MG PO CAPS
10.0000 mg | ORAL_CAPSULE | Freq: Every day | ORAL | Status: DC
  Administered 2013-10-04: 10 mg via ORAL
  Filled 2013-10-04: qty 1

## 2013-10-04 MED ORDER — ACETAMINOPHEN 325 MG PO TABS: 650.0000 mg | ORAL_TABLET | ORAL | Status: DC | PRN

## 2013-10-04 MED ORDER — ALUM & MAG HYDROXIDE-SIMETH 200-200-20 MG/5ML PO SUSP: 30.0000 mL | ORAL | Status: DC | PRN

## 2013-10-04 MED ORDER — ONDANSETRON HCL 4 MG PO TABS: 4.0000 mg | ORAL_TABLET | Freq: Three times a day (TID) | ORAL | Status: DC | PRN

## 2013-10-04 MED ORDER — CHLORPROMAZINE HCL 25 MG PO TABS: 100.0000 mg | ORAL_TABLET | Freq: Two times a day (BID) | ORAL | Status: DC

## 2013-10-04 MED ORDER — HALOPERIDOL 5 MG PO TABS: 10.0000 mg | ORAL_TABLET | Freq: Every day | ORAL | Status: DC

## 2013-10-04 MED ORDER — CHLORPROMAZINE HCL 25 MG PO TABS
100.0000 mg | ORAL_TABLET | Freq: Two times a day (BID) | ORAL | Status: DC
  Administered 2013-10-04: 100 mg via ORAL
  Filled 2013-10-04: qty 4

## 2013-10-04 MED ORDER — NICOTINE 21 MG/24HR TD PT24: 21.0000 mg | MEDICATED_PATCH | Freq: Every day | TRANSDERMAL | Status: DC

## 2013-10-04 NOTE — ED Notes (Signed)
[employer] at bedside 

## 2013-10-04 NOTE — ED Notes (Signed)
telepsych in progress 

## 2013-10-04 NOTE — ED Notes (Signed)
Pt. Seeking admission for psychiatric evaluation.  Si/hi thoughts. Visual and auditory hallucinations - "joshua and kaylap" saying, "to kill self and others around him and involved in situation."  Pt. States, "gf gave him a optiatium.
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
## 2013-10-04 NOTE — Discharge Instructions (Signed)
Schizophrenia °Schizophrenia is a mental illness. It may cause disturbed or disorganized thinking, speech, or behavior. People with schizophrenia have problems functioning in one or more areas of life: work, school, home, or relationships. People with schizophrenia are at increased risk for suicide, certain chronic physical illnesses, and unhealthy behaviors, such as smoking and drug use. °People who have family members with schizophrenia are at higher risk of developing the illness. Schizophrenia affects men and women equally but usually appears at an earlier age (teenage or early adult years) in men.  °SYMPTOMS °The earliest symptoms are often subtle (prodrome) and may go unnoticed until the illness becomes more severe (first-break psychosis). Symptoms of schizophrenia may be continuous or may come and go in severity. Episodes often are triggered by major life events, such as family stress, college, military service, marriage, pregnancy or child birth, divorce, or loss of a loved one. People with schizophrenia may see, hear, or feel things that do not exist (hallucinations). They may have false beliefs in spite of obvious proof to the contrary (delusions). Sometimes speech is incoherent or behavior is odd or withdrawn.  °DIAGNOSIS °Schizophrenia is diagnosed through an assessment by your caregiver. Your caregiver will ask questions about your thoughts, behavior, mood, and ability to function in daily life. Your caregiver may ask questions about your medical history and use of alcohol or drugs, including prescription medication. Your caregiver may also order blood tests and imaging exams. Certain medical conditions and substances can cause symptoms that resemble schizophrenia. Your caregiver may refer you to a mental health specialist for evaluation. There are three major criterion for a diagnosis of schizophrenia: °· Two or more of the following five symptoms are present for a month or longer: °¨ Delusions. Often  the delusions are that you are being attacked, harassed, cheated, persecuted or conspired against (persecutory delusions). °¨ Hallucinations.   °¨ Disorganized speech that does not make sense to others. °¨ Grossly disorganized (confused or unfocused) behavior or extremely overactive or underactive motor activity (catatonia). °¨ Negative symptoms such as bland or blunted emotions (flat affect), loss of will power (avolition), and withdrawal from social contacts (social isolation). °· Level of functioning in one or more major areas of life (work, school, relationships, or self-care) is markedly below the level of functioning before the onset of illness.   °· There are continuous signs of illness (either mild symptoms or decreased level of functioning) for at least 6 months or longer. °TREATMENT  °Schizophrenia is a long-term illness. It is best controlled with continuous treatment rather than treatment only when symptoms occur. The following treatments are used to manage schizophrenia: °· Medication--Medication is the most effective and important form of treatment for schizophrenia. Antipsychotic medications are usually prescribed to help manage schizophrenia. Other types of medication may be added to relieve any symptoms that may occur despite the use of antipsychotic medications. °· Counseling or talk therapy--Individual, group, or family counseling may be helpful in providing education, support, and guidance. Many people with schizophrenia also benefit from social skills and job skills (vocational) training. °A combination of medication and counseling is best for managing the disorder over time. A procedure in which electricity is applied to the brain through the scalp (electroconvulsive therapy) may be used to treat catatonic schizophrenia or schizophrenia in people who cannot take or do not respond to medication and counseling. °Document Released: 02/10/2000 Document Revised: 10/15/2012 Document Reviewed:  05/07/2012 °[employer]® Patient Information ©2015 [employer]. This information is not intended to replace advice given to you by   your health care provider. Make sure you discuss any questions you have with your health care provider. ° °
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
## 2013-10-04 NOTE — ED Provider Notes (Signed)
Patient not seen by me and I was not directly involved in the patient care. Behavioral Health/Psychiatry has evaluated the patient and, as per their note and recommendation, patient was to be discharged. Follow-up recommendations and medications determined by/prescribed by Behavioral Health/Psychiatry.   Gilda Creasehristopher J. Donnajean Chesnut, MD 10/04/13 [phone]

## 2013-10-04 NOTE — ED Notes (Addendum)
PT HAS CALLED HIS MOTHER AND SHE IS COMING TO TAKE HIM HOME. BELONGINGS RETURNED TO PT.HE HAS ALSO SIGNED THE NO HARM CONTRACT.

## 2013-10-04 NOTE — BH Assessment (Signed)
[name], AC at [facility], confirmed adult unit is currently at capacity. Contacted the following facilities for placement:  BED AVAILABLE, FAXED CLINICAL INFORMATION: [facility], per [facility], per [facility], per [facility], per Lisa [facility], per [facility], per Sh[facility], per Claudine  AT CAPACITY: [facility], per Jennifer [facility], per Rick [facility], per [facility], per [facility], per Shannon [facility], per Tom [facility], per Jessica [facility], per [name] [facility], per Jennifer  NO RESPONSE: [name] Brynn Marr   [name], LPC, NCC Triage Specialist [phone]  

## 2013-10-04 NOTE — BH Assessment (Signed)
[facility] Assessment Progress Note  Disposition: Clinician consulted with Alberteen SamFran Hobson, NP who states that patient meets inpatient criteria for crisis stabilization at this time. [facility] TTS to seek alternative placement.   EDP Hyacinth MeekerMiller and RN informed by Clinical research associatewriter.   Janann ColonelGregory Pickett Jr. MSW, LCSW Therapeutic Triage Services-Triage Specialist   Phone: [phone] Fax: [phone]

## 2013-10-06 ENCOUNTER — Emergency Department (HOSPITAL_COMMUNITY)
Admission: EM | Admit: 2013-10-06 | Discharge: 2013-10-06 | Disposition: A | Discharge status: Home / Self Care | Payer: Medicaid Other | Attending: Emergency Medicine | Admitting: Emergency Medicine

## 2013-10-06 ENCOUNTER — Encounter (HOSPITAL_COMMUNITY): Payer: Self-pay | Admitting: Emergency Medicine

## 2013-10-06 DIAGNOSIS — F259 Schizoaffective disorder, unspecified: Secondary | ICD-10-CM | POA: Insufficient documentation

## 2013-10-06 DIAGNOSIS — R443 Hallucinations, unspecified: Secondary | ICD-10-CM | POA: Insufficient documentation

## 2013-10-06 DIAGNOSIS — R45851 Suicidal ideations: Secondary | ICD-10-CM | POA: Diagnosis not present

## 2013-10-06 DIAGNOSIS — F319 Bipolar disorder, unspecified: Secondary | ICD-10-CM | POA: Diagnosis not present

## 2013-10-06 LAB — I-STAT CHEM 8, ED
BUN: 7 mg/dL (ref 6–23)
Calcium, Ion: 1.11 mmol/L — ABNORMAL LOW (ref 1.12–1.23)
Chloride: 100 mEq/L (ref 96–112)
Creatinine, Ser: 1.2 mg/dL (ref 0.50–1.35)
Glucose, Bld: 98 mg/dL (ref 70–99)
HEMATOCRIT: 48 % (ref 39.0–52.0)
HEMOGLOBIN: 16.3 g/dL (ref 13.0–17.0)
Potassium: 3.6 mEq/L — ABNORMAL LOW (ref 3.7–5.3)
SODIUM: 140 meq/L (ref 137–147)
TCO2: 28 mmol/L (ref 0–100)

## 2013-10-06 LAB — CBC WITH DIFFERENTIAL/PLATELET
BASOS PCT: 0 % (ref 0–1)
Basophils Absolute: 0 10*3/uL (ref 0.0–0.1)
EOS ABS: 0.1 10*3/uL (ref 0.0–0.7)
Eosinophils Relative: 2 % (ref 0–5)
HCT: 42.2 % (ref 39.0–52.0)
Hemoglobin: 14.8 g/dL (ref 13.0–17.0)
Lymphocytes Relative: 38 % (ref 12–46)
Lymphs Abs: 2.6 10*3/uL (ref 0.7–4.0)
MCH: 27.8 pg (ref 26.0–34.0)
MCHC: 35.1 g/dL (ref 30.0–36.0)
MCV: 79.3 fL (ref 78.0–100.0)
Monocytes Absolute: 0.5 10*3/uL (ref 0.1–1.0)
Monocytes Relative: 7 % (ref 3–12)
NEUTROS PCT: 53 % (ref 43–77)
Neutro Abs: 3.7 10*3/uL (ref 1.7–7.7)
PLATELETS: 330 10*3/uL (ref 150–400)
RBC: 5.32 MIL/uL (ref 4.22–5.81)
RDW: 12.8 % (ref 11.5–15.5)
WBC: 6.9 10*3/uL (ref 4.0–10.5)

## 2013-10-06 LAB — RAPID URINE DRUG SCREEN, HOSP PERFORMED
Amphetamines: NOT DETECTED
Barbiturates: NOT DETECTED
Benzodiazepines: NOT DETECTED
COCAINE: NOT DETECTED
Opiates: NOT DETECTED
Tetrahydrocannabinol: NOT DETECTED

## 2013-10-06 LAB — ETHANOL

## 2013-10-06 MED ORDER — ACETAMINOPHEN 325 MG PO TABS
650.0000 mg | ORAL_TABLET | ORAL | Status: DC | PRN
  Filled 2013-10-06: qty 2

## 2013-10-06 MED ORDER — HALOPERIDOL LACTATE 5 MG/ML IJ SOLN: 10.0000 mg | Freq: Once | INTRAMUSCULAR | Status: DC

## 2013-10-06 MED ORDER — HALOPERIDOL 5 MG PO TABS
10.0000 mg | ORAL_TABLET | Freq: Once | ORAL | Status: DC
  Filled 2013-10-06: qty 2

## 2013-10-06 MED ORDER — ONDANSETRON HCL 4 MG PO TABS: 4.0000 mg | ORAL_TABLET | Freq: Three times a day (TID) | ORAL | Status: DC | PRN

## 2013-10-06 MED ORDER — LORAZEPAM 1 MG PO TABS: 1.0000 mg | ORAL_TABLET | Freq: Three times a day (TID) | ORAL | Status: DC | PRN

## 2013-10-06 MED ORDER — ZOLPIDEM TARTRATE 5 MG PO TABS: 5.0000 mg | ORAL_TABLET | Freq: Every evening | ORAL | Status: DC | PRN

## 2013-10-06 NOTE — BH Assessment (Signed)
Patient evaluated by Dr. Ladona Ridgelaylor and he recommends discharge home. Patient already has a outpatient provider [name]([facility]). Writer encouraged patient to follow up with his outpatient provider [facility]. Writer also provided patient with [facility]'s address and phone number.

## 2013-10-06 NOTE — ED Notes (Signed)
Pt here for medical evaluation. Pt is hearing voices telling him to do things including hurting himself and other. Pt sts he does not want to do that but that's what they are telling him. Pt paranoid that [employer] is here and reading his thoughts. Pt took paper off wall and starting eating. Pt sts he poured his medications out because the voices told him to.

## 2013-10-06 NOTE — ED Notes (Signed)
Patient offered Haldol per MD order. Patient states that he is allergic to Haldol and describes anaphylactic symptoms. MD Palumbo aware. Currently plan is to hold medication until tele psych is complete.

## 2013-10-06 NOTE — ED Notes (Signed)
[employer] remains at bedside and they agree [facility]. MD aware.

## 2013-10-06 NOTE — ED Notes (Signed)
Patient continues to rest quietly in bed. Respirations equal and unlabored, skin warm and dry. No acute distress noted. Safety maintain.

## 2013-10-06 NOTE — [facility] Suicide Risk Assessment (Signed)
Suicide Risk Assessment  Discharge Assessment     Demographic Factors:  Male and Adolescent or young adult  Total Time spent with patient: 45 minutes  Psychiatric Specialty Exam:     Blood pressure 128/91, pulse 92, temperature 98 F (36.7 C), temperature source Oral, resp. rate 20, SpO2 99.00%.There is no weight on file to calculate BMI.  General Appearance: Casual  Eye Contact::  Good  Speech:  Clear and Coherent  Volume:  Normal  Mood:  Euthymic  Affect:  Appropriate  Thought Process:  Coherent and Logical  Orientation:  Full (Time, Place, and Person)  Thought Content:  Negative  Suicidal Thoughts:  No  Homicidal Thoughts:  No  Memory:  Immediate;   Good Recent;   Good Remote;   Good  Judgement:  Intact  Insight:  Shallow  Psychomotor Activity:  Normal  Concentration:  Good  Recall:  Good  Fund of Knowledge:Good  Language: Good  Akathisia:  Negative  Handed:  Right  AIMS (if indicated):     Assets:  ArchitectCommunication Skills Financial Resources/Insurance Housing Physical Health Social Support Transportation  Sleep:       Musculoskeletal: Strength & Muscle Tone: within normal limits Gait & Station: normal Patient leans: N/A   Mental Status Per Nursing Assessment::   On Admission:     Current Mental Status by Physician: NA  Loss Factors: NA  Historical Factors: NA  Risk Reduction Factors:   NA  Continued Clinical Symptoms:  Schizophrenia:   Paranoid or undifferentiated type  Cognitive Features That Contribute To Risk:  Closed-mindedness    Suicide Risk:  Minimal: No identifiable suicidal ideation.  Patients presenting with no risk factors but with morbid ruminations; may be classified as minimal risk based on the severity of the depressive symptoms  Discharge Diagnoses:   AXIS I:  Schizoaffective Disorder AXIS II:  Deferred AXIS III:   Past Medical History  Diagnosis Date  . Asthma   . Bipolar 1 disorder   . ODD (oppositional defiant  disorder)   . ADHD (attention deficit hyperactivity disorder)   . Schizophrenia   . Suicidal ideation   . Homicidal ideation   . Explosive personality disorder    AXIS IV:  chronic mental illness AXIS V:  61-70 mild symptoms  Plan Of Care/Follow-up recommendations:  Activity:  resume usual activity Diet:  resume usual diet  Is patient on multiple antipsychotic therapies at discharge:  No   Has Patient had three or more failed trials of antipsychotic monotherapy by history:  No  Recommended Plan for Multiple Antipsychotic Therapies: NA    TAYLOR,GERALD D 10/06/2013, 10:12 AM

## 2013-10-06 NOTE — ED Notes (Signed)
Patient made aware by writer that TTS counsleor would be down to speak with hi and do an assessment on him and discuss the plan of care with him. Patient verbalized understanding and ham sandwich and orange juice given to patient Patient continues to remain calm and cooperative at this time.  Will continue to monitor patient. Safety maintain.

## 2013-10-06 NOTE — ED Notes (Signed)
Patient came to desk asking when will he see a counselor. Patient informed by Clinical research associatewriter that counselor was with another patient and would see him as soon as possible. Patient verbalized understanding and went back to his room safety maintain.

## 2013-10-06 NOTE — ED Provider Notes (Signed)
CSN: [id number]     Arrival date & time 10/06/13  0047 History   First MD Initiated Contact with Patient 10/06/13 0053     Chief Complaint  Patient presents with  . Medical Clearance  . Hallucinations     (Consider location/radiation/quality/duration/timing/severity/associated sxs/prior Treatment) Patient is a 23 y.o. male presenting with mental health disorder. The history is provided by the patient.  Mental Health Problem Presenting symptoms: delusional and hallucinations   Presenting symptoms: no self mutilation and no suicide attempt   Patient accompanied by:  Law enforcement Degree of incapacity (severity):  Moderate Onset quality:  Gradual Timing:  Constant Progression:  Unchanged Chronicity:  Chronic Context: noncompliance   Context: not alcohol use   Treatment compliance:  Untreated Relieved by:  Nothing Worsened by:  Nothing tried Ineffective treatments:  None tried Associated symptoms: no abdominal pain and no chest pain   Risk factors: hx of mental illness     Past Medical History  Diagnosis Date  . Asthma   . Bipolar 1 disorder   . ODD (oppositional defiant disorder)   . ADHD (attention deficit hyperactivity disorder)   . Schizophrenia   . Suicidal ideation   . Homicidal ideation   . Explosive personality disorder    History reviewed. No pertinent past surgical history. History reviewed. No pertinent family history. History  Substance Use Topics  . Smoking status: Never Smoker   . Smokeless tobacco: Not on file  . Alcohol Use: No     Comment: Patient denies    Review of Systems  Cardiovascular: Negative for chest pain.  Gastrointestinal: Negative for abdominal pain.  Psychiatric/Behavioral: Positive for hallucinations. Negative for self-injury.  All other systems reviewed and are negative.     Allergies  Carbamazepine and Strattera  Home Medications   Prior to Admission medications   Medication Sig Start Date End Date Taking? Authorizing  Provider  chlorproMAZINE (THORAZINE) 100 MG tablet Take 100 mg by mouth 2 (two) times daily.    Historical Provider, MD  FLUoxetine (PROZAC) 10 MG capsule Take 10 mg by mouth daily.    Historical Provider, MD  haloperidol (HALDOL) 10 MG tablet Take 1 tablet (10 mg total) by mouth at bedtime. 08/13/13   Fransisca Kaufmann, NP  traZODone (DESYREL) 150 MG tablet Take 1 tablet (150 mg total) by mouth at bedtime. 08/13/13   Fransisca Kaufmann, NP   BP 150/104  Pulse 95  Temp(Src) 98.7 F (37.1 C) (Oral)  Resp 18  SpO2 95% Physical Exam  Constitutional: He appears well-developed and well-nourished. No distress.  HENT:  Head: Normocephalic and atraumatic.  Mouth/Throat: Oropharynx is clear and moist.  Eyes: Conjunctivae are normal. Pupils are equal, round, and reactive to light.  Neck: Normal range of motion. Neck supple.  Cardiovascular: Normal rate, regular rhythm and intact distal pulses.   Pulmonary/Chest: Effort normal and breath sounds normal. He has no wheezes. He has no rales.  Abdominal: Soft. Bowel sounds are normal. There is no tenderness. There is no rebound and no guarding.  Musculoskeletal: Normal range of motion.  Neurological: He is alert. He has normal reflexes.  Skin: Skin is warm and dry.  Psychiatric: His mood appears not anxious. His speech is not rapid and/or pressured. He is not agitated. Thought content is paranoid and delusional. He expresses no suicidal plans and no homicidal plans.    ED Course  Procedures (including critical care time) Labs Review Labs Reviewed  I-STAT CHEM 8, ED - Abnormal; Notable for the following:  Potassium 3.6 (*)    Calcium, Ion 1.11 (*)    All other components within normal limits  CBC WITH DIFFERENTIAL  URINE RAPID DRUG SCREEN (HOSP PERFORMED)  ETHANOL    Imaging Review No results found.   EKG Interpretation None      MDM   Final diagnoses:  None    Can be seen and dispo-ed by psych [facility].  Orders placed.   Will transfer  Communicated with Dr. Lynelle DoctorKnapp regarding same.      Jasmine AweApril K Damico Partin-Rasch, MD 10/06/13 [phone]

## 2013-10-06 NOTE — Consult Note (Signed)
[facility]   Reason for Consult:  Reportedly hearing voices Referring Physician:  ER MD  [name] is an 23 y.o. male. Total Time spent with patient: 45 minutes  Assessment: AXIS I:  Schizoaffective Disorder AXIS II:  Deferred AXIS III:   Past Medical History  Diagnosis Date  . Asthma   . Bipolar 1 disorder   . ODD (oppositional defiant disorder)   . ADHD (attention deficit hyperactivity disorder)   . Schizophrenia   . Suicidal ideation   . Homicidal ideation   . Explosive personality disorder    AXIS IV:  chronic mental illness AXIS V:  61-70 mild symptoms  Plan:  No evidence of imminent risk to self or others at present.    Subjective:   [name] is a 23 y.o. male patient admitted with reported voices.  HPI:  [name] is a frequent visitor to the ER.  He [facility] because there are more people to talk to there.  This morning he says since he took his medication he is not hearing voices.  He denies any suicidal ideation and wants to be discharged.  He has a history of being overly dramatic and is is hard to know when he is serious or not. HPI Elements:   Location:  hearing voices. Quality:  says he is not hearing voices this morning. Severity:  regular visits to the ER. Timing:  says he missed a dose of his medicines. Duration:  years. Context:  as above.  Past Psychiatric History: Past Medical History  Diagnosis Date  . Asthma   . Bipolar 1 disorder   . ODD (oppositional defiant disorder)   . ADHD (attention deficit hyperactivity disorder)   . Schizophrenia   . Suicidal ideation   . Homicidal ideation   . Explosive personality disorder     reports that he has never smoked. He does not have any smokeless tobacco history on file. He reports that he does not drink alcohol or use illicit drugs. History reviewed. No pertinent family history.         Allergies:   Allergies  Allergen Reactions  .  Carbamazepine Anaphylaxis and Cough    Cough up blood  . Haldol [Haloperidol Lactate] Anaphylaxis    Patient reports that he stopped taking this because of throat swelling.   [name]. Strattera [Atomoxetine] Anaphylaxis and Cough    Cough up blood    ACT Assessment Complete:  Yes:    Educational Status    Risk to Self: Risk to self with the past 6 months Is patient at risk for suicide?: Yes Substance abuse history and/or treatment for substance abuse?: No  Risk to Others:    Abuse:    Prior Inpatient Therapy:    Prior Outpatient Therapy:    Additional Information:                    Objective: Blood pressure 128/91, pulse 92, temperature 98 F (36.7 C), temperature source Oral, resp. rate 20, SpO2 99.00%.There is no weight on file to calculate BMI. Results for orders placed during the hospital encounter of 10/06/13 (from the past 72 hour(s))  URINE RAPID DRUG SCREEN (HOSP PERFORMED)     Status: None   Collection Time    10/06/13  1:14 AM      Result Value Ref Range   Opiates NONE DETECTED  NONE DETECTED   Cocaine NONE DETECTED  NONE DETECTED   Benzodiazepines NONE  DETECTED  NONE DETECTED   Amphetamines NONE DETECTED  NONE DETECTED   Tetrahydrocannabinol NONE DETECTED  NONE DETECTED   Barbiturates NONE DETECTED  NONE DETECTED   Comment:            DRUG SCREEN FOR MEDICAL PURPOSES     ONLY.  IF CONFIRMATION IS NEEDED     FOR ANY PURPOSE, NOTIFY LAB     WITHIN 5 DAYS.                LOWEST DETECTABLE LIMITS     FOR URINE DRUG SCREEN     Drug Class       Cutoff (ng/mL)     Amphetamine      1000     Barbiturate      200     Benzodiazepine   200     Tricyclics       300     Opiates          300     Cocaine          300     THC              50  CBC WITH DIFFERENTIAL     Status: None   Collection Time    10/06/13  1:19 AM      Result Value Ref Range   WBC 6.9  4.0 - 10.5 K/uL   RBC 5.32  4.22 - 5.81 MIL/uL   Hemoglobin 14.8  13.0 - 17.0 g/dL   HCT 16.1  09.6 -  04.5 %   MCV 79.3  78.0 - 100.0 fL   MCH 27.8  26.0 - 34.0 pg   MCHC 35.1  30.0 - 36.0 g/dL   RDW 40.9  81.1 - 91.4 %   Platelets 330  150 - 400 K/uL   Neutrophils Relative % 53  43 - 77 %   Neutro Abs 3.7  1.7 - 7.7 K/uL   Lymphocytes Relative 38  12 - 46 %   Lymphs Abs 2.6  0.7 - 4.0 K/uL   Monocytes Relative 7  3 - 12 %   Monocytes Absolute 0.5  0.1 - 1.0 K/uL   Eosinophils Relative 2  0 - 5 %   Eosinophils Absolute 0.1  0.0 - 0.7 K/uL   Basophils Relative 0  0 - 1 %   Basophils Absolute 0.0  0.0 - 0.1 K/uL  ETHANOL     Status: None   Collection Time    10/06/13  1:19 AM      Result Value Ref Range   Alcohol, Ethyl (B) <11  0 - 11 mg/dL   Comment:            LOWEST DETECTABLE LIMIT FOR     SERUM ALCOHOL IS 11 mg/dL     FOR MEDICAL PURPOSES ONLY  I-STAT CHEM 8, ED     Status: Abnormal   Collection Time    10/06/13  1:21 AM      Result Value Ref Range   Sodium 140  137 - 147 mEq/L   Potassium 3.6 (*) 3.7 - 5.3 mEq/L   Chloride 100  96 - 112 mEq/L   BUN 7  6 - 23 mg/dL   Creatinine, Ser 7.82  0.50 - 1.35 mg/dL   Glucose, Bld 98  70 - 99 mg/dL   Calcium, Ion 9.56 (*) 1.12 - 1.23 mmol/L   TCO2 28  0 - 100 mmol/L   Hemoglobin  16.3  13.0 - 17.0 g/dL   HCT 16.1  09.6 - 04.5 %   Labs are reviewed and are pertinent for no psychiatric issues.  Current Facility-Administered Medications  Medication Dose Route Frequency Provider Last Rate Last Dose  . acetaminophen (TYLENOL) tablet 650 mg  650 mg Oral Q4H PRN April K Palumbo-Rasch, MD      . LORazepam (ATIVAN) tablet 1 mg  1 mg Oral Q8H PRN April K Palumbo-Rasch, MD      . ondansetron [facility]) tablet 4 mg  4 mg Oral Q8H PRN April K Palumbo-Rasch, MD      . zolpidem [facility]) tablet 5 mg  5 mg Oral QHS PRN April Smitty Cords, MD       Current Outpatient Prescriptions  Medication Sig Dispense Refill  . chlorproMAZINE (THORAZINE) 100 MG tablet Take 100 mg by mouth 2 (two) times daily.      Marland Kitchen FLUoxetine (PROZAC) 10 MG capsule  Take 10 mg by mouth daily.      . haloperidol (HALDOL) 10 MG tablet Take 1 tablet (10 mg total) by mouth at bedtime.  30 tablet  0  . traZODone (DESYREL) 150 MG tablet Take 1 tablet (150 mg total) by mouth at bedtime.  30 tablet  0    Psychiatric Specialty Exam:     Blood pressure 128/91, pulse 92, temperature 98 F (36.7 C), temperature source Oral, resp. rate 20, SpO2 99.00%.There is no weight on file to calculate BMI.  General Appearance: Casual  Eye Contact::  Good  Speech:  Clear and Coherent  Volume:  Normal  Mood:  Euthymic  Affect:  Appropriate  Thought Process:  Coherent and Logical  Orientation:  Full (Time, Place, and Person)  Thought Content:  Negative  Suicidal Thoughts:  No  Homicidal Thoughts:  No  Memory:  Immediate;   Good Recent;   Good Remote;   Good  Judgement:  Intact  Insight:  Shallow  Psychomotor Activity:  Normal  Concentration:  Good  Recall:  Good  Fund of Knowledge:Good  Language: Good  Akathisia:  Negative  Handed:  Right  AIMS (if indicated):   0  Assets:  Communication Skills Housing Intimacy Physical Health Social Support Transportation  Sleep:      Musculoskeletal: Strength & Muscle Tone: within normal limits Gait & Station: normal Patient leans: N/A  Treatment Plan Summary: does not want to be at this hospital, now denies any voices or paranoia since he took his medications.  Discharge home to be followed outpatient  Rodell Marrs D 10/06/2013 9:56 AM

## 2013-10-06 NOTE — ED Notes (Signed)
Bed: [id number] Expected date: 10/06/13 Expected time:  Means of arrival:  Comments: Hold for R. [employer]Johnson

## 2013-10-06 NOTE — ED Notes (Signed)
[facility]  [facility] via ambulatory with a steady gait escorted by GP/ Upon writer entering patient room patient was sitting on bed. Patient stated " I don't want to be here at this hospital" when asked why by writer patient stated " I like it at the other hospital because I have a sitter who cam sit and talk with me and [facility]. Now my mom is 45 minutes away from me and she has to waste gas coming all the way over here to see me. Patient also states that  "if I can't go back to the other hospital then I want to go home. The only reason I'm here is because I stop taking my medications today because I was mad at my girlfriend." Writer informed patient that he might have to stay here and see the psychiatrist in the morning and that he would also speak with a TTS counselor this morning. Patient stated " If they don't let me go back to the over hospital I"m going to leave because I cam voluntarily" Writer informed patient that she would make EDP and TTS counselors aware of his request . Patient also states " I'm getting angry because I was where I wanted to get treatment at and they moved me. I don't have nothing against you nurses here I just like the other hospital better. I get to see more people. Patient denies SI, HI and AVH at this time. Patient also verbally contracts for safety. Encouragement and support provided to patient and safety maintain.

## 2013-10-06 NOTE — ED Notes (Signed)
TTS counselor Camelia Engerri, made aware of patient status and feelings about being transferred here. Counselor contacted the EDP and informed him also. Will continue to monitor patient and keep him informed.

## 2013-10-07 ENCOUNTER — Emergency Department (HOSPITAL_COMMUNITY)
Admission: EM | Admit: 2013-10-07 | Discharge: 2013-10-08 | Disposition: A | Discharge status: Home / Self Care | Payer: Medicaid Other | Attending: Emergency Medicine | Admitting: Emergency Medicine

## 2013-10-07 ENCOUNTER — Encounter (HOSPITAL_COMMUNITY): Payer: Self-pay | Admitting: Emergency Medicine

## 2013-10-07 DIAGNOSIS — F29 Unspecified psychosis not due to a substance or known physiological condition: Secondary | ICD-10-CM | POA: Insufficient documentation

## 2013-10-07 DIAGNOSIS — F308 Other manic episodes: Secondary | ICD-10-CM | POA: Insufficient documentation

## 2013-10-07 DIAGNOSIS — IMO0002 Reserved for concepts with insufficient information to code with codable children: Secondary | ICD-10-CM | POA: Diagnosis not present

## 2013-10-07 DIAGNOSIS — F23 Brief psychotic disorder: Secondary | ICD-10-CM

## 2013-10-07 DIAGNOSIS — F259 Schizoaffective disorder, unspecified: Secondary | ICD-10-CM | POA: Diagnosis not present

## 2013-10-07 DIAGNOSIS — F209 Schizophrenia, unspecified: Secondary | ICD-10-CM | POA: Diagnosis not present

## 2013-10-07 DIAGNOSIS — Z79899 Other long term (current) drug therapy: Secondary | ICD-10-CM | POA: Diagnosis not present

## 2013-10-07 DIAGNOSIS — R443 Hallucinations, unspecified: Secondary | ICD-10-CM | POA: Diagnosis not present

## 2013-10-07 DIAGNOSIS — J45909 Unspecified asthma, uncomplicated: Secondary | ICD-10-CM | POA: Insufficient documentation

## 2013-10-07 DIAGNOSIS — Z008 Encounter for other general examination: Secondary | ICD-10-CM | POA: Insufficient documentation

## 2013-10-07 LAB — COMPREHENSIVE METABOLIC PANEL
ALBUMIN: 4.2 g/dL (ref 3.5–5.2)
ALT: 20 U/L (ref 0–53)
AST: 23 U/L (ref 0–37)
Alkaline Phosphatase: 77 U/L (ref 39–117)
Anion gap: 13 (ref 5–15)
BUN: 7 mg/dL (ref 6–23)
CALCIUM: 9.3 mg/dL (ref 8.4–10.5)
CHLORIDE: 103 meq/L (ref 96–112)
CO2: 25 meq/L (ref 19–32)
CREATININE: 0.78 mg/dL (ref 0.50–1.35)
GFR calc Af Amer: >90 mL/min (ref >90)
GFR calc non Af Amer: >90 mL/min (ref >90)
Glucose, Bld: 97 mg/dL (ref 70–99)
Potassium: 4.4 mEq/L (ref 3.7–5.3)
SODIUM: 141 meq/L (ref 137–147)
Total Bilirubin: 0.4 mg/dL (ref 0.3–1.2)
Total Protein: 8.2 g/dL (ref 6.0–8.3)

## 2013-10-07 LAB — RAPID URINE DRUG SCREEN, HOSP PERFORMED
AMPHETAMINES: NOT DETECTED
BENZODIAZEPINES: NOT DETECTED
Barbiturates: NOT DETECTED
COCAINE: NOT DETECTED
OPIATES: NOT DETECTED
Tetrahydrocannabinol: NOT DETECTED

## 2013-10-07 LAB — CBC
HCT: 45.9 % (ref 39.0–52.0)
Hemoglobin: 15.9 g/dL (ref 13.0–17.0)
MCH: 27.4 pg (ref 26.0–34.0)
MCHC: 34.6 g/dL (ref 30.0–36.0)
MCV: 79.1 fL (ref 78.0–100.0)
PLATELETS: 373 10*3/uL (ref 150–400)
RBC: 5.8 MIL/uL (ref 4.22–5.81)
RDW: 12.9 % (ref 11.5–15.5)
WBC: 4.7 10*3/uL (ref 4.0–10.5)

## 2013-10-07 LAB — ETHANOL

## 2013-10-07 LAB — ACETAMINOPHEN LEVEL: Acetaminophen (Tylenol), Serum: <15 ug/mL (ref 10–30)

## 2013-10-07 LAB — SALICYLATE LEVEL

## 2013-10-07 MED ORDER — ZIPRASIDONE MESYLATE 20 MG IM SOLR
10.0000 mg | Freq: Once | INTRAMUSCULAR | Status: DC
  Filled 2013-10-07: qty 20

## 2013-10-07 MED ORDER — STERILE WATER FOR INJECTION IJ SOLN
INTRAMUSCULAR | Status: AC
  Filled 2013-10-07: qty 10

## 2013-10-07 MED ORDER — LIDOCAINE HCL (PF) 1 % IJ SOLN
INTRAMUSCULAR | Status: AC
  Filled 2013-10-07: qty 5

## 2013-10-07 MED ORDER — FLUOXETINE HCL 10 MG PO CAPS
10.0000 mg | ORAL_CAPSULE | Freq: Every day | ORAL | Status: DC
  Administered 2013-10-07 – 2013-10-08 (×2): 10 mg via ORAL
  Filled 2013-10-07 (×2): qty 1

## 2013-10-07 MED ORDER — LORAZEPAM 2 MG/ML IJ SOLN
2.0000 mg | Freq: Once | INTRAMUSCULAR | Status: AC
Start: 2013-10-07 — End: 2013-10-07
  Administered 2013-10-07: 2 mg via INTRAMUSCULAR
  Filled 2013-10-07: qty 1

## 2013-10-07 NOTE — ED Notes (Signed)
The pt iks n ot making much sense.  He thinks that we are attempting to control his mind and place it on the news.  He is talking to someone that is not in the room

## 2013-10-07 NOTE — ED Notes (Addendum)
Pt. Refusing Geodon. He states that he can not take it because it causes his throat to swell and he throws up blood. MD made aware.

## 2013-10-07 NOTE — ED Provider Notes (Signed)
CSN: [id number]     Arrival date & time 10/07/13  1621 History   First MD Initiated Contact with Patient 10/07/13 1859     Chief Complaint  Patient presents with  . Psychiatric Evaluation     (Consider location/radiation/quality/duration/timing/severity/associated sxs/prior Treatment) HPI Comments: 23 year old male with history of schizoaffective disorder,  aggressive behavior presents the ER for worsening psychosis. Patient in hearing voices from different people various times a day and stop [place] as various with no specific reason. Patient schizophrenia history and has not take his medicines regularly. Patient supposed to be on Thorazine and Prozac. Patient seen recently [facility] long psychiatry. Patient feels his thoughts and behavior is not controlled. While waiting to be seen patient snuck a bottle of peroxide and drink some of it because he thought it was alcohol as a voices were telling him to drink it.   The history is provided by the patient.    Past Medical History  Diagnosis Date  . Asthma   . Bipolar 1 disorder   . ODD (oppositional defiant disorder)   . ADHD (attention deficit hyperactivity disorder)   . Schizophrenia   . Suicidal ideation   . Homicidal ideation   . Explosive personality disorder    History reviewed. No pertinent past surgical history. No family history on file. History  Substance Use Topics  . Smoking status: Never Smoker   . Smokeless tobacco: Not on file  . Alcohol Use: No     Comment: Patient denies    Review of Systems  Constitutional: Negative for fever and chills.  HENT: Negative for congestion.   Eyes: Negative for visual disturbance.  Respiratory: Negative for shortness of breath.   Cardiovascular: Negative for chest pain.  Gastrointestinal: Negative for vomiting and abdominal pain.  Genitourinary: Negative for dysuria and flank pain.  Musculoskeletal: Negative for back pain, neck pain and neck stiffness.  Skin: Negative for rash.   Neurological: Negative for light-headedness and headaches.  Psychiatric/Behavioral: Positive for hallucinations and behavioral problems. Negative for suicidal ideas.      Allergies  Carbamazepine; Haldol; Strattera; and Other  Home Medications   Prior to Admission medications   Medication Sig Start Date End Date Taking? Authorizing Provider  chlorproMAZINE (THORAZINE) 100 MG tablet Take 100 mg by mouth daily.    Yes Historical Provider, MD  FLUoxetine (PROZAC) 10 MG capsule Take 10 mg by mouth daily.   Yes Historical Provider, MD   BP 138/63  Pulse 87  Temp(Src) 98.5 F (36.9 C) (Oral)  Resp 20  SpO2 98% Physical Exam  Nursing note and vitals reviewed. Constitutional: He appears well-developed and well-nourished.  HENT:  Head: Normocephalic and atraumatic.  Eyes: Conjunctivae and EOM are normal. Right eye exhibits no discharge. Left eye exhibits no discharge.  Neck: Normal range of motion. Neck supple. No tracheal deviation present.  Cardiovascular: Normal rate and regular rhythm.   Pulmonary/Chest: Effort normal.  Abdominal: He exhibits no distension.  Musculoskeletal: He exhibits no edema.  Neurological: He is alert.  Skin: Skin is warm. No rash noted.  Psychiatric: He is agitated and actively hallucinating.  Patient talking to people that are not in the rhythm, intermittent hallucinations. Intermittent aggression in ER.    ED Course  Procedures (including critical care time) Labs Review Labs Reviewed  SALICYLATE LEVEL - Abnormal; Notable for the following:    Salicylate Lvl <2.0 (*)    All other components within normal limits  ACETAMINOPHEN LEVEL  CBC  COMPREHENSIVE METABOLIC PANEL  ETHANOL  URINE RAPID DRUG SCREEN (HOSP PERFORMED)    Imaging Review No results found.   EKG Interpretation None      MDM   Final diagnoses:  Acute psychosis  Schizoaffective disorder, unspecified type   Patient with known history of uncontrolled schizophrenia and  explosive personality disorder. Patient presents with worsening psychiatric symptoms in hearing voices. While waiting in pot C. for evaluation he drank peroxide because a voice told him to a fibers alcohol in it. Patient's having worsening agitation the ER multiple attempts to calm him and have him sit in the bed. IM dose of Geodon ordered to help control his symptoms to finish the evaluation and protect surrounding staff and himself. Behavioral health/psychiatry paged for assistance.  Patient has possible allergy to Geodon, Ativan given. Patient's behavior and hallucinations improved in ER. Psychiatric assessment in the morning.  Filed Vitals:   10/07/13 1634 10/07/13 2315  BP: 138/63 119/76  Pulse: 87 81  Temp: 98.5 F (36.9 C) 98.2 F (36.8 C)  TempSrc: Oral Oral  Resp: 20 18  SpO2: 98% 99%        Enid SkeensJoshua M Dashaun Onstott, MD 10/08/13 [phone]

## 2013-10-07 NOTE — ED Notes (Addendum)
Informed pt this RN would be as soon as possible to evaluate. Pt playing on computer in room. Informed pt that is not allowed.  Pt attempting to close door; asked pt to stay in room with door open. Pt taking medications from nurse. Stating "I'm a doctor, How does this work\? Is that insulin?"

## 2013-10-07 NOTE — Consult Note (Signed)
Case discussed, and agree with plan 

## 2013-10-07 NOTE — ED Notes (Addendum)
Pt came out of room dranking bottle of peroxide unaware of amount. No vomiting after drinking.  GDP at bedside. Dr.Zavitz aware of situation

## 2013-10-07 NOTE — ED Notes (Signed)
The pt is unable to void at present

## 2013-10-07 NOTE — ED Notes (Signed)
Zavitz at bedside. 

## 2013-10-07 NOTE — ED Notes (Signed)
Informed security of patient being on the computer and walking around nursing station.  Consulting civil engineerCharge RN at bedside

## 2013-10-07 NOTE — ED Notes (Signed)
Informed Poison Control of possible ingestion of peroxide. Suggests post 4hr Tylenol levels.

## 2013-10-08 ENCOUNTER — Encounter (HOSPITAL_COMMUNITY): Payer: Self-pay | Admitting: Emergency Medicine

## 2013-10-08 ENCOUNTER — Emergency Department (HOSPITAL_COMMUNITY)
Admission: EM | Admit: 2013-10-08 | Discharge: 2013-10-09 | Disposition: A | Discharge status: Home / Self Care | Payer: Medicaid Other | Source: Home / Self Care | Attending: Emergency Medicine | Admitting: Emergency Medicine

## 2013-10-08 DIAGNOSIS — R45850 Homicidal ideations: Secondary | ICD-10-CM | POA: Insufficient documentation

## 2013-10-08 DIAGNOSIS — F209 Schizophrenia, unspecified: Secondary | ICD-10-CM | POA: Insufficient documentation

## 2013-10-08 DIAGNOSIS — T50902A Poisoning by unspecified drugs, medicaments and biological substances, intentional self-harm, initial encounter: Secondary | ICD-10-CM

## 2013-10-08 DIAGNOSIS — F603 Borderline personality disorder: Secondary | ICD-10-CM | POA: Insufficient documentation

## 2013-10-08 DIAGNOSIS — F319 Bipolar disorder, unspecified: Secondary | ICD-10-CM

## 2013-10-08 DIAGNOSIS — F259 Schizoaffective disorder, unspecified: Secondary | ICD-10-CM

## 2013-10-08 DIAGNOSIS — R45851 Suicidal ideations: Secondary | ICD-10-CM

## 2013-10-08 DIAGNOSIS — R4689 Other symptoms and signs involving appearance and behavior: Secondary | ICD-10-CM

## 2013-10-08 LAB — CBC
HCT: 45.5 % (ref 39.0–52.0)
Hemoglobin: 15.6 g/dL (ref 13.0–17.0)
MCH: 26.9 pg (ref 26.0–34.0)
MCHC: 34.3 g/dL (ref 30.0–36.0)
MCV: 78.4 fL (ref 78.0–100.0)
Platelets: 407 10*3/uL — ABNORMAL HIGH (ref 150–400)
RBC: 5.8 MIL/uL (ref 4.22–5.81)
RDW: 12.7 % (ref 11.5–15.5)
WBC: 6.4 10*3/uL (ref 4.0–10.5)

## 2013-10-08 LAB — COMPREHENSIVE METABOLIC PANEL
ALK PHOS: 79 U/L (ref 39–117)
ALT: 21 U/L (ref 0–53)
AST: 29 U/L (ref 0–37)
Albumin: 4.6 g/dL (ref 3.5–5.2)
Anion gap: 17 — ABNORMAL HIGH (ref 5–15)
BILIRUBIN TOTAL: 0.5 mg/dL (ref 0.3–1.2)
BUN: 7 mg/dL (ref 6–23)
CHLORIDE: 100 meq/L (ref 96–112)
CO2: 23 mEq/L (ref 19–32)
Calcium: 9.3 mg/dL (ref 8.4–10.5)
Creatinine, Ser: 0.95 mg/dL (ref 0.50–1.35)
GFR calc Af Amer: >90 mL/min (ref >90)
GFR calc non Af Amer: >90 mL/min (ref >90)
Glucose, Bld: 100 mg/dL — ABNORMAL HIGH (ref 70–99)
Potassium: 3.7 mEq/L (ref 3.7–5.3)
SODIUM: 140 meq/L (ref 137–147)
TOTAL PROTEIN: 8.5 g/dL — AB (ref 6.0–8.3)

## 2013-10-08 LAB — ACETAMINOPHEN LEVEL: Acetaminophen (Tylenol), Serum: <15 ug/mL (ref 10–30)

## 2013-10-08 LAB — SALICYLATE LEVEL

## 2013-10-08 LAB — ETHANOL: Alcohol, Ethyl (B): <11 mg/dL (ref 0–11)

## 2013-10-08 MED ORDER — DIPHENHYDRAMINE HCL 50 MG/ML IJ SOLN
50.0000 mg | Freq: Once | INTRAMUSCULAR | Status: AC
  Administered 2013-10-08: 50 mg via INTRAMUSCULAR
  Filled 2013-10-08: qty 1

## 2013-10-08 MED ORDER — LORAZEPAM 1 MG PO TABS
1.0000 mg | ORAL_TABLET | ORAL | Status: DC | PRN
  Administered 2013-10-08: 1 mg via ORAL
  Filled 2013-10-08: qty 1

## 2013-10-08 MED ORDER — LORAZEPAM 2 MG/ML IJ SOLN
2.0000 mg | Freq: Once | INTRAMUSCULAR | Status: AC
  Administered 2013-10-08: 2 mg via INTRAMUSCULAR
  Filled 2013-10-08: qty 1

## 2013-10-08 NOTE — Discharge Instructions (Signed)
Take your meds as prescribed.   Follow up with your psychiatrist.   Return to ER if you have thoughts of harming yourself or others, hallucinations.    Emergency Department Resource Guide 1) Find a Doctor and Pay Out of Pocket Although you won't have to find out who is covered by your insurance plan, it is a good idea to ask around and get recommendations. You will then need to call the office and see if the doctor you have chosen will accept you as a new patient and what types of options they offer for patients who are self-pay. Some doctors offer discounts or will set up payment plans for their patients who do not have insurance, but you will need to ask so you aren't surprised when you get to your appointment.  2) Contact Your Local Health Department Not all health departments have doctors that can see patients for sick visits, but many do, so it is worth a call to see if yours does. If you don't know where your local health department is, you can check in your phone book. The CDC also has a tool to help you locate your state's health department, and many state websites also have listings of all of their local health departments.  3) Find a Walk-in Clinic If your illness is not likely to be very severe or complicated, you may want to try a walk in clinic. These are popping up all over the country in pharmacies, drugstores, and shopping centers. They're usually staffed by nurse practitioners or physician assistants that have been trained to treat common illnesses and complaints. They're usually fairly quick and inexpensive. However, if you have serious medical issues or chronic medical problems, these are probably not your best option.  No Primary Care Doctor: - Call Health Connect at  [phone] - they can help you locate a primary care doctor that  accepts your insurance, provides certain services, etc. - Physician Referral Service- [phone]  Chronic Pain Problems: Organization          Address  Phone   Notes  [facility]  [phone] Patients need to be referred by their primary care doctor.   Medication Assistance: Organization         Address  Phone   Notes  [facility] [address] [place], [place] [address] [phone] --Must be a resident of [facility] [place] -- Must have NO insurance coverage whatsoever (no Medicaid/ Medicare, etc.) -- The pt. MUST have a primary care doctor that directs their care regularly and follows them in the community   MedAssist  [phone]   [employer]  [phone]    Agencies that provide inexpensive medical care: Organization         Address  Phone   Notes  [facility]  [phone]   [facility]    [phone]   [facility] [address] [place], [place] [address] [phone]   [facility] [address], [place] [phone]   Planned Parenthood    [phone]   [facility]    [phone]   [facility]  [address],  Phone:  [phone], Fax:  [phone] Hours of Operation:  9 am - 6 pm, M-F.  Also accepts Medicaid/Medicare and self-pay.  [facility]  [address]  [address], [place] Phone: [phone], Fax: [phone]. Hours of Operation:  8:30 am - 5:30 pm, M-F.  Also accepts Medicaid and self-pay.  [facility] [address] [place] Phone: [phone]   [place], [place], [place] [phone], Ext. [phone] Mondays & Thursdays: 7-9 AM.  First 15 patients are seen on a first come, first serve basis.    [place] Providers:  Organization         Address  Phone   Notes  [facility] [address], [place] [phone] Also accepts self-pay patients.  [facility] [address], [place]  [phone]   [place], [address], [place] [phone]   [facility] [address], [place] [phone]   Lucianne Lei [address], [place]   [phone] Only accepts [place] Access [place] patients after they have their name applied to their card.   Self-Pay (no insurance) in [facility]:  Organization         Address  Phone   Notes  Sickle Cell Patients, [facility] Internal [facility] [place] [phone]   [facility] [place] [phone]   [facility] [place]  [place], [place], [place] [phone]   [facility]/Dr. Osei-Bonsu  [address] [place] or [address], [place] [phone] Phone number for both [place] and [place] locations is the same.  Urgent Medical and [facility] [address] [place] [phone]   [facility] [address] [place] or [address] [phone]   [facility] [address], [place] [phone], phone; [phone], fax Sees patients 1st and 3rd Saturday of every month.  Must not qualify for public or private insurance (i.e. Medicaid, Medicare, [place] Health Choice, Veterans' Benefits)  Household income should be no more than 200% of the poverty level The clinic cannot treat you if you are pregnant or think you are pregnant  Sexually transmitted diseases are not treated at the clinic.    Dental Care: Organization         Address  Phone  Notes  [facility] Department of [facility] [phone] Accepts children up to age 74 who are enrolled in [place] or [place]; pregnant women with a Medicaid card; and  children who have applied for Medicaid or [place] Health Choice, but were declined, whose parents can pay a reduced fee at time of service.  [facility] Department of [facility]  [address], [place] [phone] Accepts children up to age 79 who are enrolled in [place] or [place]; pregnant women with a Medicaid card; and children who have applied for Medicaid or [place] Health Choice, but were declined, whose parents can pay a reduced fee at time of service.  [facility] PRO[facility]  [place] [phone] Patients are seen by appointment only. Walk-ins are not accepted. [place] will see patients 64 years of age and older. Monday - Tuesday (8am-5pm) Most Wednesdays (8:30-5pm) $30 per visit, cash only  [place]  [address], [place] [phone] Patients are seen by appointment only. Walk-ins are not accepted. [place] will see patients 31 years of age and older. One Wednesday Evening (Monthly: Volunteer Based).  $30 per visit, cash only  [place]  [phone] for adults; Children under age 53, call Graduate Pediatric Dentistry at [phone]. Children aged 35-14, please call [phone] to request a pediatric application.  Dental services are provided in all areas of dental care including fillings, crowns and bridges, complete and partial dentures, implants, gum treatment, root canals, and extractions. Preventive care is also provided. Treatment is provided to both adults and children. Patients are selected via a lottery and there is often a waiting list.   [facility] [address] [place]  [phone] [email or web address]   [facility] [address] [place], [place] [phone], Ext. [phone] Second and Fourth Thursday of each month, opens at 6:30 AM; Clinic ends at 9 AM.  Patients are seen on a first-come first-served  basis, and a limited number are seen during each clinic.   [facility]  [address] [place]ard Danker [place], [place] [phone]   Eligibility Requirements You must have lived in [place], [place], or [place] counties for at least the last three months.   You cannot be eligible for state or federal sponsored [employer], including [employer], [place], or [employer].   You generally cannot be eligible for healthcare insurance through your employer.    How to apply: Eligibility screenings are held every Tuesday and Wednesday afternoon from 1:00 pm until 4:00 pm. You do not need an appointment for the interview!  [facility] [address], [place], [place]   [place]  [place] Department  [place]  [phone]    Behavioral Health Resources in the Community: Intensive Outpatient Programs Organization         Address  Phone  Notes  [place] [address], [place], [place] [phone]   [facility] Outpatient [address] [place], [place]   ADS: Alcohol & Drug Svcs [address], [place], [place]   [place] [address],  [place], [place] or [phone]   Substance Abuse Resources Organization         Address  Phone  Notes  Alcohol and Drug Services  [phone]   [place]  [phone]   The [place]   [place]  [phone]   Residential & Outpatient Substance Abuse Program  [phone]   Psychological Services Organization         Address  Phone  Notes  [facility] [place]  [place]  [phone]   [place] [address], [place] or [phone]    Mobile Crisis Teams Organization          Address  Phone  Notes  [facility], Mobile Crisis Care Unit  [phone]   [facility] Psychotherapeutic [facility]  [address] [place], [place]   [place] Levels [address], [place] [phone]    Self-Help/Support Groups Organization         Address  Phone             Notes  [place] of [place] -  variety of support groups  [phone] Call for more information  Narcotics Anonymous (NA), Caring [facility] [address], [place]  2 meetings at this location   Residential Facilities manager         Address  Phone  Notes  [facility]idential [facility] [place],    [place]  [phone]   [facility]  [address] [place] [id number], [place], [place]   [place], [place] [phone] Admissions: 8am-3pm M-F  Incentives Substance [place] [address]    [place], [place] [phone]   [facility] [address] [place], [place], [place]   [facility] [address]  [place], [place]   [facility]ight Programs - Intensive Outpatient [address], [place], [place]   [facility].,  [place], [place] [phone] or [phone]   [facility]idential Treatment Services (RTS) [address], [place], [place] Accepts Medicaid  [facility] [place] [address],  [place] [phone] Substance Abuse/Addiction Treatment   [facility] Organization         Address  Phone  Notes  [facility]  [phone]   Domenic Schwab, PhD [address] Arlis [place], [place]   [phone] or [phone]   [place], [place] [phone]   [facility] [address] [place], [place] [phone] Insurance/Medicaid/sponsorship  through [facility] and Families [address] [place]                                    [place], [place] [phone] [place] [address], [place] [phone]    Dr. Adele Schilder  [phone]   [facility] Dept. 1) [address]  2) [place] 3)  [place] [address], [place] [phone]  [phone]   [place] [phone] or [phone] (After Hours)

## 2013-10-08 NOTE — ED Notes (Signed)
Pt here for hearing voices and seeing things. Pt states the voices have been telling him to do crazy things. Pt denies the voices telling him to harm himself but states they are currently telling him to "kill that cop". Pt has superficial lacerations to left forearm, when asked what happened to his arm pt states "I had to get the tracking device out of my arm, the [employer] is tracking me". Pt currently calm but thoughts all over the place. Dr. Micheline Mazeocherty made aware.

## 2013-10-08 NOTE — BH Assessment (Signed)
Tele Assessment Note   [name] is a 23 y.o. male who presents via IVC petition, initiated by [facility] physician--Dr. Jodi Mourning.  Pt returns to the emerg dept after being psychiatrically cleared and d/c'd by Dr. Carolanne Grumbling on 10/07/11.  When pt arrived at the [facility], he was not making any sense to medical staff, he stated that they were trying to control his mind and place it on the news.  Pt was speaking to someone that was not present in the hosp room.  Pt told this write that he was hearing voices when he came to the hospital w/o command to harm himself.  Pt denies SI/HI, stating his last SI attempt was approx 4 mos ago.  Pt says he is no longer hearing voices, since being administered meds(prozac and ativan) by medical staff and wants a prescription and want to return home.    Pt has been restless and agitated while in the emerg dept.  Pt was utilizing the computer in the hospital room after being told he was not allowed to use, he was constantly at the nurses station and he was taking medications from the nurses, telling them he was a doctor and asking how the medications work.  Pt then came out of his room ingesting a bottle of peroxide(amt unk); poison control was contacted and consulted on protocol.  Pt told this Clinical research associate he thought it was "soda" and told medical staff the voices told him to drink it.  Pt has been aggressive toward staff at [facility] per petition.     Axis I: Schizoaffective Disorder Axis II: Deferred Axis III:  Past Medical History  Diagnosis Date  . Asthma   . Bipolar 1 disorder   . ODD (oppositional defiant disorder)   . ADHD (attention deficit hyperactivity disorder)   . Schizophrenia   . Suicidal ideation   . Homicidal ideation   . Explosive personality disorder    Axis IV: other psychosocial or environmental problems, problems related to social environment and problems with primary support group Axis V: 21-30 behavior considerably influenced by  delusions or hallucinations OR serious impairment in judgment, communication OR inability to function in almost all areas  Past Medical History:  Past Medical History  Diagnosis Date  . Asthma   . Bipolar 1 disorder   . ODD (oppositional defiant disorder)   . ADHD (attention deficit hyperactivity disorder)   . Schizophrenia   . Suicidal ideation   . Homicidal ideation   . Explosive personality disorder     History reviewed. No pertinent past surgical history.  Family History: History reviewed. No pertinent family history.  Social History:  reports that he has never smoked. He does not have any smokeless tobacco history on file. He reports that he does not drink alcohol or use illicit drugs.  Additional Social History:  Alcohol / Drug Use Pain Medications: See MAR  Prescriptions: See MAR  Over the Counter: See MAR  History of alcohol / drug use?: No history of alcohol / drug abuse Longest period of sobriety (when/how long): None   CIWA: CIWA-Ar BP: 119/76 mmHg Pulse Rate: 81 COWS:    PATIENT STRENGTHS: (choose at least two) Supportive family/friends  Allergies:  Allergies  Allergen Reactions  . Carbamazepine Anaphylaxis, Other (See Comments) and Cough    Cough up blood  . Haldol [Haloperidol Lactate] Anaphylaxis and Other (See Comments)    Patient reports that he stopped taking this because of throat swelling.   [name] [Atomoxetine]  Anaphylaxis, Other (See Comments) and Cough    Cough up blood  . Other Nausea Only    Apple Juice, stomach hurts and itchy throat     Home Medications:  (Not in a hospital admission)  OB/GYN Status:  No LMP for male patient.  General Assessment Data [facility] Is this a Tele or Face-to-Face Assessment?: Tele Assessment Is this an Initial Assessment or a Re-assessment for this encounter?: Initial Assessment Living Arrangements: Parent;Other relatives (Lives with mom/dad and siblings ) Can pt return to  current living arrangement?: Yes Admission Status: Involuntary Is patient capable of signing voluntary admission?: No Transfer from: Acute Hospital Referral Source: MD  Medical Screening Exam [facility] Walk-in ONLY) Medical Exam completed: No Reason for MSE not completed: Other: (None )  [facility] Crisis [facility] Living Arrangements: Parent;Other relatives (Lives with mom/dad and siblings ) Name of Psychiatrist: [facility] Name of Therapist: [facility]  Education Status Is patient currently in school?: No Current Grade: None  Highest grade of school patient has completed: None  Name of school: None  Contact person: None   Risk to self with the past 6 months Suicidal Ideation: No-Not Currently/Within Last 6 Months Suicidal Intent: No-Not Currently/Within Last 6 Months Is patient at risk for suicide?: No Suicidal Plan?: No Specify Current Suicidal Plan: None  Access to Means: No Specify Access to Suicidal Means: None  What has been your use of drugs/alcohol within the last 12 months?: Pt denies  Previous Attempts/Gestures: Yes How many times?:  (Multiple ) Other Self Harm Risks: None  Triggers for Past Attempts: Hallucinations;Unpredictable Intentional Self Injurious Behavior: None Comment - Self Injurious Behavior: Pt has hx of biting, scratching, hitting himself  Family Suicide History: No Recent stressful life event(s): Other (Comment) (Chronic MH ) Persecutory voices/beliefs?: No Depression: No (None reported ) Depression Symptoms:  (None reported ) Substance abuse history and/or treatment for substance abuse?: No Suicide prevention information given to non-admitted patients: Not applicable  Risk to Others within the past 6 months Homicidal Ideation: No Thoughts of Harm to Others: No Comment - Thoughts of Harm to Others: None  Current Homicidal Intent: No Current Homicidal Plan: No Access to Homicidal Means: No Describe Access to Homicidal Means: None   Identified Victim: None  History of harm to others?: No Assessment of Violence: None Noted Violent Behavior Description: None  Does patient have access to weapons?: No Criminal Charges Pending?: No Does patient have a court date: No  Psychosis Hallucinations: Auditory Delusions: None noted  Mental Status Report Appear/Hygiene: Disheveled;In scrubs Eye Contact: Good Motor Activity: Agitation Speech: Loud;Logical/coherent Level of Consciousness: Alert Mood: Other (Comment) (Appropriate ) Affect: Appropriate to circumstance;Anxious Anxiety Level: Minimal Thought Processes: Relevant Judgement: Impaired Orientation: Person;Place;Time;Situation Obsessive Compulsive Thoughts/Behaviors: None  Cognitive Functioning Concentration: Normal Memory: Recent Intact;Remote Intact IQ: Average Insight: Poor Impulse Control: Poor Appetite: Good Weight Loss: 0 Weight Gain: 0 Sleep: No Change Total Hours of Sleep: 6 Vegetative Symptoms: None  ADLScreening [facility] Assessment Services) Patient's cognitive ability adequate to safely complete daily activities?: Yes Patient able to express need for assistance with ADLs?: Yes Independently performs ADLs?: Yes (appropriate for developmental age)  Prior Inpatient Therapy Prior Inpatient Therapy: Yes Prior Therapy Dates: Multple  [facility] and other facilities  Reason for Treatment: Psychosis   Prior Outpatient Therapy Prior Outpatient Therapy: Yes Prior Therapy Dates: Current Prior Therapy Facilty/Provider(s): [facility] Reason for Treatment: Med Mgmt and Therapy  ADL Screening (condition  at time of admission) Patient's cognitive ability adequate to safely complete daily activities?: Yes Is the patient deaf or have difficulty hearing?: No Does the patient have difficulty seeing, even when wearing glasses/contacts?: No Does the patient have difficulty concentrating, remembering, or making decisions?:  Yes Patient able to express need for assistance with ADLs?: Yes Does the patient have difficulty dressing or bathing?: No Independently performs ADLs?: Yes (appropriate for developmental age) Does the patient have difficulty walking or climbing stairs?: No Weakness of Legs: None Weakness of Arms/Hands: None  Home Assistive Devices/Equipment Home Assistive Devices/Equipment: None  Therapy Consults (therapy consults require a physician order) PT Evaluation Needed: No OT Evalulation Needed: No SLP Evaluation Needed: No Abuse/Neglect Assessment (Assessment to be complete while patient is alone) Physical Abuse: Denies Verbal Abuse: Denies Sexual Abuse: Denies Exploitation of patient/patient's resources: Denies Self-Neglect: Denies Values / Beliefs Cultural Requests During Hospitalization: None Spiritual Requests During Hospitalization: None Consults Spiritual Care Consult Needed: No Social Work Consult Needed: No   Nutrition Screen- [facility] Adult/WL/AP Patient's home diet: Regular  Additional Information 1:1 In Past 12 Months?: No CIRT Risk: No Elopement Risk: No Does patient have medical clearance?: Yes     Disposition:  Disposition Initial Assessment Completed for this Encounter: Yes Disposition of Patient: Referred to (AM psych eval to uphold/rescind IVC ) Type of inpatient treatment program: Adult Patient referred to: Other (Comment) (AM psych eval to uphold/rescind IVC )  Beatrix Shipper C 10/08/2013 12:05 AM

## 2013-10-08 NOTE — ED Notes (Signed)
Pt presents with aggressive behavior, took 6 10 mg fluoxetine and 7 thorazine pills, unknown if anything was taken. Attempting to take needles out of hand at triage and has superficial lacerations to left wrist. Pt is argumentative and states, "the voices in my head make me do this"   Verbally combative.

## 2013-10-08 NOTE — ED Notes (Signed)
ivc papers were refax to the magistrate office and were confirm by the sec Victorino DikeJennifer that the papers were  Received and [employer] will be able to serve the papers

## 2013-10-08 NOTE — ED Notes (Signed)
Pt received injections-- Ativan 2mg   IM in right deltoid, benadryl 50mg  IM left deltoid. meds not scanned at present due to emergency situation.  Pt agreeable to receive medicine if security will leave the hallway outside his door. Explained to pt that security will leave when I ask them to, and he will need to stop behaviors that are threatening. Pt went to the door of his room, glared at security guard, stating " Dorathy DaftKayla has told me to hurt you, but I am not going to because you have not done anything to me. " pt is upset that his sitter changed this morning, stated "you lied to me, I wanted to have the other sitter. I want a new one, I am not getting along with this one." Informed pt that sitter was not changing,

## 2013-10-08 NOTE — ED Notes (Signed)
Pt reports that he has to use the bathroom, but when informed that because he is a risk to self and others he would not be able to go into the bathroom and close the door pt became upset, refused to use the urinal and informed this RN that he would just hold it or urinate on himself.

## 2013-10-08 NOTE — Progress Notes (Signed)
[facility]  Email sent to [name] (EDCP)

## 2013-10-08 NOTE — Consult Note (Signed)
[name]   Reason for Singleton:  Paranoid schizophrenia and auditory hallucinations Referring Physician:  EDP  [name] is an 23 y.o. male. Total Time spent with patient: 45 minutes  Assessment: AXIS I:  Schizoaffective Disorder AXIS II:  Deferred AXIS III:   Past Medical History  Diagnosis Date  . Asthma   . Bipolar 1 disorder   . ODD (oppositional defiant disorder)   . ADHD (attention deficit hyperactivity disorder)   . Schizophrenia   . Suicidal ideation   . Homicidal ideation   . Explosive personality disorder    AXIS IV:  other psychosocial or environmental problems, problems related to social environment and problems with primary support group AXIS V:  41-50 serious symptoms  Plan:  Supportive therapy provided about ongoing stressors.  Subjective:   [name] is a 23 y.o. male patient admitted with auditory hallucinations.  HPI:  Patient is seen face to face for psychiatric evaluation and case discussed with staff RN, Santiago Glad and Case manager - Seth Bake. [name] is a 23 y.o. male with chronic paranoid schizophrenia came to [facility] for auditory hallucinations and non compliance with medication management and placed on IVC petition, initiated by [employer] physician--Dr. Reather Converse. Patient returns to the [facility] after being psychiatrically cleared and d/c'd by Dr. Donnelly Angelica on 10/07/11 for similar complaints. Patient was taken medications as prescribed and woke up this morning and stated that he has been feeling much better and has no auditory or visual hallucinations and asking to be discharged to out patient care. Patient lives with his mother, step dad and siblings. Patient has no paranoid thoughts and delusions. He has denied symptoms of depression, anxiety and mania. Patient denies SI/HI, and contract for safety. He has no drug of abuse or alcohol and willing to participate in out patient care. He is no longer hearing voices, since being  administered meds(prozac and ativan) by medical staff. Patient stated that he is originally form [place], lived in [place] and relocated to [facility] to stay close with his mother and step dad about five months ago. He is calm and cooperative. He has good eye contact and anxious mood and constricted affect.   HPI Elements:   Location:  psychosis. Quality:  poor. Severity:  acute. Timing:  unknown.  Past Psychiatric History: Past Medical History  Diagnosis Date  . Asthma   . Bipolar 1 disorder   . ODD (oppositional defiant disorder)   . ADHD (attention deficit hyperactivity disorder)   . Schizophrenia   . Suicidal ideation   . Homicidal ideation   . Explosive personality disorder     reports that he has never smoked. He does not have any smokeless tobacco history on file. He reports that he does not drink alcohol or use illicit drugs. History reviewed. No pertinent family history. Family History Substance Abuse: No Family Supports: Yes, List: (Parents ) Living Arrangements: Parent;Other relatives (Lives with mom/dad and siblings ) Can pt return to current living arrangement?: Yes Abuse/Neglect [facility]) Physical Abuse: Denies Verbal Abuse: Denies Sexual Abuse: Denies Allergies:   Allergies  Allergen Reactions  . Carbamazepine Anaphylaxis, Other (See Comments) and Cough    Cough up blood  . Haldol [Haloperidol Lactate] Anaphylaxis and Other (See Comments)    Patient reports that he stopped taking this because of throat swelling.   [name] [Atomoxetine] Anaphylaxis, Other (See Comments) and Cough    Cough up blood  . Other Nausea Only    Apple Juice, stomach hurts  and itchy throat     ACT Assessment Complete:  Yes:    Educational Status    Risk to Self: Risk to self with the past 6 months Suicidal Ideation: No-Not Currently/Within Last 6 Months Suicidal Intent: No-Not Currently/Within Last 6 Months Is patient at risk for suicide?: No Suicidal Plan?: No Specify Current  Suicidal Plan: None  Access to Means: No Specify Access to Suicidal Means: None  What has been your use of drugs/alcohol within the last 12 months?: Pt denies  Previous Attempts/Gestures: Yes How many times?:  (Multiple ) Other Self Harm Risks: None  Triggers for Past Attempts: Hallucinations;Unpredictable Intentional Self Injurious Behavior: None Comment - Self Injurious Behavior: Pt has hx of biting, scratching, hitting himself  Family Suicide History: No Recent stressful life event(s): Other (Comment) (Chronic MH ) Persecutory voices/beliefs?: No Depression: No (None reported ) Depression Symptoms:  (None reported ) Substance abuse history and/or treatment for substance abuse?: No Suicide prevention information given to non-admitted patients: Not applicable  Risk to Others: Risk to Others within the past 6 months Homicidal Ideation: No Thoughts of Harm to Others: No Comment - Thoughts of Harm to Others: None  Current Homicidal Intent: No Current Homicidal Plan: No Access to Homicidal Means: No Describe Access to Homicidal Means: None  Identified Victim: None  History of harm to others?: No Assessment of Violence: None Noted Violent Behavior Description: None  Does patient have access to weapons?: No Criminal Charges Pending?: No Does patient have a court date: No  Abuse: Abuse/Neglect Assessment (Assessment to be complete while patient is alone) Physical Abuse: Denies Verbal Abuse: Denies Sexual Abuse: Denies Exploitation of patient/patient's resources: Denies Self-Neglect: Denies  Prior Inpatient Therapy: Prior Inpatient Therapy Prior Inpatient Therapy: Yes Prior Therapy Dates: Multple  Prior Therapy Facilty/Provider(s): [facility], [facility] and other facilities  Reason for Treatment: Psychosis   Prior Outpatient Therapy: Prior Outpatient Therapy Prior Outpatient Therapy: Yes Prior Therapy Dates: Current Prior Therapy Facilty/Provider(s): [facility] Reason for Treatment: Med  Mgmt and Therapy  Additional Information: Additional Information 1:1 In Past 12 Months?: No CIRT Risk: No Elopement Risk: No Does patient have medical clearance?: Yes     Objective: Blood pressure 120/70, pulse 80, temperature 98 F (36.7 C), temperature source Oral, resp. rate 18, SpO2 99.00%.There is no weight on file to calculate BMI. Results for orders placed during the hospital encounter of 10/07/13 (from the past 72 hour(s))  ACETAMINOPHEN LEVEL     Status: None   Collection Time    10/07/13  4:37 PM      Result Value Ref Range   Acetaminophen (Tylenol), Serum <15.0  10 - 30 ug/mL   Comment:            THERAPEUTIC CONCENTRATIONS VARY     SIGNIFICANTLY. A RANGE OF 10-30     ug/mL MAY BE AN EFFECTIVE     CONCENTRATION FOR MANY PATIENTS.     HOWEVER, SOME ARE BEST TREATED     AT CONCENTRATIONS OUTSIDE THIS     RANGE.     ACETAMINOPHEN CONCENTRATIONS     >150 ug/mL AT 4 HOURS AFTER     INGESTION AND >50 ug/mL AT 12     HOURS AFTER INGESTION ARE     OFTEN ASSOCIATED WITH TOXIC     REACTIONS.  CBC     Status: None   Collection Time    10/07/13  4:37 PM      Result Value Ref Range   WBC 4.7  4.0 -  10.5 K/uL   RBC 5.80  4.22 - 5.81 MIL/uL   Hemoglobin 15.9  13.0 - 17.0 g/dL   HCT 45.9  39.0 - 52.0 %   MCV 79.1  78.0 - 100.0 fL   MCH 27.4  26.0 - 34.0 pg   MCHC 34.6  30.0 - 36.0 g/dL   RDW 12.9  11.5 - 15.5 %   Platelets 373  150 - 400 K/uL  COMPREHENSIVE METABOLIC PANEL     Status: None   Collection Time    10/07/13  4:37 PM      Result Value Ref Range   Sodium 141  137 - 147 mEq/L   Potassium 4.4  3.7 - 5.3 mEq/L   Comment: DELTA CHECK NOTED     SLIGHT HEMOLYSIS   Chloride 103  96 - 112 mEq/L   CO2 25  19 - 32 mEq/L   Glucose, Bld 97  70 - 99 mg/dL   BUN 7  6 - 23 mg/dL   Creatinine, Ser 0.78  0.50 - 1.35 mg/dL   Comment: DELTA CHECK NOTED   Calcium 9.3  8.4 - 10.5 mg/dL   Total Protein 8.2  6.0 - 8.3 g/dL   Albumin 4.2  3.5 - 5.2 g/dL   AST 23  0 - 37 U/L    Comment: HEMOLYSIS AT THIS LEVEL MAY AFFECT RESULT   ALT 20  0 - 53 U/L   Alkaline Phosphatase 77  39 - 117 U/L   Total Bilirubin 0.4  0.3 - 1.2 mg/dL   GFR calc non Af Amer >90  >90 mL/min   GFR calc Af Amer >90  >90 mL/min   Comment: (NOTE)     The eGFR has been calculated using the CKD EPI equation.     This calculation has not been validated in all clinical situations.     eGFR's persistently <90 mL/min signify possible Chronic Kidney     Disease.   Anion gap 13  5 - 15  ETHANOL     Status: None   Collection Time    10/07/13  4:37 PM      Result Value Ref Range   Alcohol, Ethyl (B) <11  0 - 11 mg/dL   Comment:            LOWEST DETECTABLE LIMIT FOR     SERUM ALCOHOL IS 11 mg/dL     FOR MEDICAL PURPOSES ONLY  SALICYLATE LEVEL     Status: Abnormal   Collection Time    10/07/13  4:37 PM      Result Value Ref Range   Salicylate Lvl <6.8 (*) 2.8 - 20.0 mg/dL  URINE RAPID DRUG SCREEN (HOSP PERFORMED)     Status: None   Collection Time    10/07/13 10:47 PM      Result Value Ref Range   Opiates NONE DETECTED  NONE DETECTED   Cocaine NONE DETECTED  NONE DETECTED   Benzodiazepines NONE DETECTED  NONE DETECTED   Amphetamines NONE DETECTED  NONE DETECTED   Tetrahydrocannabinol NONE DETECTED  NONE DETECTED   Barbiturates NONE DETECTED  NONE DETECTED   Comment:            DRUG SCREEN FOR MEDICAL PURPOSES     ONLY.  IF CONFIRMATION IS NEEDED     FOR ANY PURPOSE, NOTIFY LAB     WITHIN 5 DAYS.                LOWEST DETECTABLE LIMITS  FOR URINE DRUG SCREEN     Drug Class       Cutoff (ng/mL)     Amphetamine      1000     Barbiturate      200     Benzodiazepine   299     Tricyclics       371     Opiates          300     Cocaine          300     THC              50   Labs are reviewed and are pertinent for as above.  Current Facility-Administered Medications  Medication Dose Route Frequency Provider Last Rate Last Dose  . diphenhydrAMINE (BENADRYL) injection 50 mg  50  mg Intramuscular Once Wandra Arthurs, MD      . FLUoxetine (PROZAC) capsule 10 mg  10 mg Oral Daily Mariea Clonts, MD   10 mg at 10/07/13 2049  . LORazepam (ATIVAN) injection 2 mg  2 mg Intramuscular Once Wandra Arthurs, MD      . LORazepam (ATIVAN) tablet 1 mg  1 mg Oral I9C PRN Delora Fuel, MD   1 mg at 78/93/81 0175   Current Outpatient Prescriptions  Medication Sig Dispense Refill  . chlorproMAZINE (THORAZINE) 100 MG tablet Take 100 mg by mouth daily.       Marland Kitchen FLUoxetine (PROZAC) 10 MG capsule Take 10 mg by mouth daily.        Psychiatric Specialty Exam: Physical Exam Full physical performed in Emergency Department. I have reviewed this assessment and concur with its findings.   Review of Systems  Psychiatric/Behavioral: Positive for hallucinations.     Blood pressure 120/70, pulse 80, temperature 98 F (36.7 C), temperature source Oral, resp. rate 18, SpO2 99.00%.There is no weight on file to calculate BMI.  General Appearance: Guarded  Eye Contact::  Good  Speech:  Clear and Coherent  Volume:  Decreased  Mood:  Anxious  Affect:  Appropriate and Congruent  Thought Process:  Coherent and Goal Directed  Orientation:  Full (Time, Place, and Person)  Thought Content:  Paranoid Ideation  Suicidal Thoughts:  No  Homicidal Thoughts:  No  Memory:  Immediate;   Fair Recent;   Fair  Judgement:  Intact  Insight:  Fair  Psychomotor Activity:  Decreased  Concentration:  Good  Recall:  Good  Fund of Knowledge:Good  Language: Good  Akathisia:  NA  Handed:  Right  AIMS (if indicated):     Assets:  Communication Skills Desire for Improvement Financial Resources/Insurance Housing Intimacy Physical Health Resilience Social Support Talents/Skills  Sleep:      Musculoskeletal: Strength & Muscle Tone: within normal limits Gait & Station: normal Patient leans: N/A  Treatment Plan Summary: Daily contact with patient to assess and evaluate symptoms and progress in  treatment Medication management Continue out patient psychiatry treatment at [facility] and ACT team as scheduled Rescind IVC  And no medication changes during this visit  Havanah Nelms,JANARDHAHA R. 10/08/2013 9:48 AM

## 2013-10-08 NOTE — ED Provider Notes (Addendum)
  Physical Exam  BP 120/70  Pulse 80  Temp(Src) 98 F (36.7 C) (Oral)  Resp 18  SpO2 99%  Physical Exam  ED Course  Procedures  Patient here for mania. He has been agitated at night and was given ativan. He is out of med and constantly ringing the call bell. Seems agitated this AM. Will give IM meds. IVC by previous provider. Pending psych placement    11:24 AM Dr. Carmelina DaneJonnalagada saw patient. He reversed his IVC paperwork. Will d/c home. He has been in and out of the hospital recently.   Richardean Canalavid H Sabriah Hobbins, MD 10/08/13 [id number]  Richardean Canalavid H Lateef Juncaj, MD 10/08/13 1125

## 2013-10-08 NOTE — Progress Notes (Signed)
[facility] after being cleared by Psychiatrist for discharged today. Patient's mother alerted  ED staff that patient was on his way back to ED  patient patient was on Pod A at that time. Patient has had 12 ED visits and 1 [facility] hospitalization.  Patient present this ED visit  with aggressive behavior, took 6 10 mg fluoxetine and 7 thorazine pills, unknown if anything was taken. Attempting to take needles out of hand at triage and has superficial lacerations to left wrist. Pt is argumentative and states, "the voices in my head make me do this" Verbally combative.  [facility]  [facility] once patient is stabled. [facility] ED CM presented patient in LOS rounds today, it was noted that Dr. Freida BusmanAllen was notified of patient.

## 2013-10-08 NOTE — ED Notes (Signed)
Seen by dr Carmelina Danejonnalagada. Pt states will contract for safety, will rescind IVC papers

## 2013-10-08 NOTE — ED Provider Notes (Signed)
CSN: [id number]     Arrival date & time 10/08/13  2021 History   First MD Initiated Contact with Patient 10/08/13 2124     Chief Complaint  Patient presents with  . Drug Overdose  . Psychiatric Evaluation     (Consider location/radiation/quality/duration/timing/severity/associated sxs/prior Treatment) Patient is a 23 y.o. male presenting with mental health disorder. The history is provided by the patient. No language interpreter was used.  Mental Health Problem Presenting symptoms: aggressive behavior, agitation, delusional and paranoid behavior   Patient accompanied by:  Law enforcement Degree of incapacity (severity):  Unable to specify Duration: since age 89. Timing:  Constant Progression:  Waxing and waning Chronicity:  Recurrent Context: noncompliance   Treatment compliance:  Some of the time Relieved by:  Nothing Worsened by:  Nothing tried Ineffective treatments:  None tried Associated symptoms: irritability   Associated symptoms: no abdominal pain, no appetite change, no chest pain, no fatigue and no headaches   Risk factors: hx of mental illness and recent psychiatric admission     Past Medical History  Diagnosis Date  . Asthma   . Bipolar 1 disorder   . ODD (oppositional defiant disorder)   . ADHD (attention deficit hyperactivity disorder)   . Schizophrenia   . Suicidal ideation   . Homicidal ideation   . Explosive personality disorder    History reviewed. No pertinent past surgical history. History reviewed. No pertinent family history. History  Substance Use Topics  . Smoking status: Never Smoker   . Smokeless tobacco: Not on file  . Alcohol Use: No     Comment: Patient denies    Review of Systems  Constitutional: Positive for irritability. Negative for fever, activity change, appetite change and fatigue.  HENT: Negative for congestion, facial swelling, rhinorrhea and trouble swallowing.   Eyes: Negative for photophobia and pain.  Respiratory:  Negative for cough, chest tightness and shortness of breath.   Cardiovascular: Negative for chest pain and leg swelling.  Gastrointestinal: Negative for nausea, vomiting, abdominal pain, diarrhea and constipation.  Endocrine: Negative for polydipsia and polyuria.  Genitourinary: Negative for dysuria, urgency, decreased urine volume and difficulty urinating.  Musculoskeletal: Negative for back pain and gait problem.  Skin: Negative for color change, rash and wound.  Allergic/Immunologic: Negative for immunocompromised state.  Neurological: Negative for dizziness, facial asymmetry, speech difficulty, weakness, numbness and headaches.  Psychiatric/Behavioral: Positive for paranoia and agitation. Negative for confusion and decreased concentration.      Allergies  Carbamazepine; Haldol; Strattera; and Other  Home Medications   Prior to Admission medications   Medication Sig Start Date End Date Taking? Authorizing Provider  chlorproMAZINE (THORAZINE) 100 MG tablet Take 100 mg by mouth daily.    Yes Historical Provider, MD  FLUoxetine (PROZAC) 10 MG capsule Take 10 mg by mouth daily.   Yes Historical Provider, MD   BP 116/57  Pulse 81  Temp(Src) 98.6 F (37 C) (Oral)  Resp 14  SpO2 97% Physical Exam  Constitutional: He is oriented to person, place, and time. He appears well-developed and well-nourished. No distress.  HENT:  Head: Normocephalic and atraumatic.  Mouth/Throat: No oropharyngeal exudate.  Eyes: Pupils are equal, round, and reactive to light.  Neck: Normal range of motion. Neck supple.  Cardiovascular: Normal rate, regular rhythm and normal heart sounds.  Exam reveals no gallop and no friction rub.   No murmur heard. Pulmonary/Chest: Effort normal and breath sounds normal. No respiratory distress. He has no wheezes. He has no rales.  Abdominal: Soft.  Bowel sounds are normal. He exhibits no distension and no mass. There is no tenderness. There is no rebound and no guarding.   Musculoskeletal: Normal range of motion. He exhibits no edema and no tenderness.       Arms: Neurological: He is alert and oriented to person, place, and time.  Skin: Skin is warm and dry.  Psychiatric: His affect is angry. He is agitated.  Reports auditory hallucinations, but then turns to corner of the room to address these voices.     ED Course  Procedures (including critical care time) Labs Review Labs Reviewed  CBC - Abnormal; Notable for the following:    Platelets 407 (*)    All other components within normal limits  COMPREHENSIVE METABOLIC PANEL - Abnormal; Notable for the following:    Glucose, Bld 100 (*)    Total Protein 8.5 (*)    Anion gap 17 (*)    All other components within normal limits  SALICYLATE LEVEL - Abnormal; Notable for the following:    Salicylate Lvl <2.0 (*)    All other components within normal limits  ETHANOL  ACETAMINOPHEN LEVEL  URINE RAPID DRUG SCREEN (HOSP PERFORMED)    Imaging Review No results found.   EKG Interpretation   Date/Time:  Thursday October 08 2013 23:22:06 EDT Ventricular Rate:  84 PR Interval:  161 QRS Duration: 96 QT Interval:  357 QTC Calculation: 422 R Axis:   56 Text Interpretation:  Sinus rhythm No old tracing to compare Confirmed by  OTTER  MD, OLGA ([id number]) on 10/08/2013 11:57:00 PM      MDM   Final diagnoses:  Overdose, intentional self-harm, initial encounter  Aggression  Schizoaffective disorder, unspecified type    Pt is a 23 y.o. male with Pmhx as above who presents reporting continued command auditory hallucinations which he has had since age 46. He was seen for same twice recently and not felt to meet inpt criteria. He took #6 10 mg fluoxetine and #7 100mg  thorazine pills in front of nursing at 8:50 PM "because the voices told him too".  He denies this was a suicide attempt. Pt is argumentative, has difficulty following commands. He also reports delusion he has a tracking device under his arm, but does  not seem genuine about this belief. He stops purposefully mid sentence "answer questions" to the voices in his head, staring to the same corner of the room; however, pt did not report visual hallucinations. His speech is fluent, goal directed, is not tangential. There are no flight of ideas.  I have concern that there may be secondary gain involved in his repeat ED visits, however, given his possible overdose, will consult TTS. IVC filed as I feel he is flight risk. I spoke w/ poison control who rec monitoring for 6-8 hrs (3-5am).           Toy Cookey, MD 10/09/13 0130

## 2013-10-08 NOTE — ED Notes (Signed)
Pt requesting that med be given in applesauce--pt is allergic to apples-- when reminded of this, pt became defensive stating " who told you this--?" talking about the sitter.

## 2013-10-08 NOTE — Progress Notes (Signed)
Writer informed the ER MD Dr. Silverio LayYao and the nurse Clydie BraunKaren that the patient will be assessed by Dr. Elsie SaasJonnalagadda face to face.

## 2013-10-08 NOTE — ED Notes (Signed)
Pt. At desk talking about Serial killers [name] and [name]. He states that he has been told in the past that he was a sociopath and a psychopath. He states that if he did become one he would kill only Black haired white women. He asked me if I thought could become a serial killer. I told him that that was his choice. Everyone wakes up with the same choice to be good or evil. He stated that he has voices that tell him to kill people. I asked him what had stopped him so far? He stated he didn't want to do it. I told him that the good in him still prevailed. He had continuously talked about a [name] and another male.

## 2013-10-08 NOTE — ED Notes (Signed)
[employer] notified of need of sitter.

## 2013-10-08 NOTE — ED Notes (Signed)
On phone, attempting to call someone to pick him up.

## 2013-10-08 NOTE — ED Notes (Signed)
Pt's mother called to be updated on pt's status, informed mother that pt just got here and that he is currently being evaluated by physician. Mother states she will call back later.

## 2013-10-09 ENCOUNTER — Emergency Department (HOSPITAL_COMMUNITY): Admit: 2013-10-09 | Payer: Medicaid Other

## 2013-10-09 ENCOUNTER — Encounter (HOSPITAL_COMMUNITY): Payer: Self-pay | Admitting: Psychiatry

## 2013-10-09 ENCOUNTER — Inpatient Hospital Stay (HOSPITAL_COMMUNITY)
Admission: AD | Admit: 2013-10-09 | Discharge: 2013-10-13 | DRG: 885 | Disposition: A | Discharge status: Home / Self Care | Payer: Medicaid Other | Attending: Psychiatry | Admitting: Psychiatry

## 2013-10-09 DIAGNOSIS — F909 Attention-deficit hyperactivity disorder, unspecified type: Secondary | ICD-10-CM | POA: Diagnosis present

## 2013-10-09 DIAGNOSIS — Z9119 Patient's noncompliance with other medical treatment and regimen: Secondary | ICD-10-CM | POA: Diagnosis not present

## 2013-10-09 DIAGNOSIS — F319 Bipolar disorder, unspecified: Secondary | ICD-10-CM | POA: Diagnosis present

## 2013-10-09 DIAGNOSIS — R45851 Suicidal ideations: Secondary | ICD-10-CM

## 2013-10-09 DIAGNOSIS — F259 Schizoaffective disorder, unspecified: Principal | ICD-10-CM | POA: Diagnosis present

## 2013-10-09 DIAGNOSIS — F200 Paranoid schizophrenia: Secondary | ICD-10-CM | POA: Diagnosis not present

## 2013-10-09 DIAGNOSIS — Z609 Problem related to social environment, unspecified: Secondary | ICD-10-CM

## 2013-10-09 DIAGNOSIS — Z91199 Patient's noncompliance with other medical treatment and regimen due to unspecified reason: Secondary | ICD-10-CM

## 2013-10-09 DIAGNOSIS — F250 Schizoaffective disorder, bipolar type: Secondary | ICD-10-CM

## 2013-10-09 DIAGNOSIS — R4689 Other symptoms and signs involving appearance and behavior: Secondary | ICD-10-CM

## 2013-10-09 DIAGNOSIS — Z79899 Other long term (current) drug therapy: Secondary | ICD-10-CM | POA: Diagnosis not present

## 2013-10-09 DIAGNOSIS — F411 Generalized anxiety disorder: Secondary | ICD-10-CM | POA: Diagnosis present

## 2013-10-09 DIAGNOSIS — IMO0002 Reserved for concepts with insufficient information to code with codable children: Secondary | ICD-10-CM | POA: Diagnosis not present

## 2013-10-09 DIAGNOSIS — T50902S Poisoning by unspecified drugs, medicaments and biological substances, intentional self-harm, sequela: Secondary | ICD-10-CM

## 2013-10-09 DIAGNOSIS — J45909 Unspecified asthma, uncomplicated: Secondary | ICD-10-CM | POA: Diagnosis present

## 2013-10-09 DIAGNOSIS — F308 Other manic episodes: Secondary | ICD-10-CM | POA: Diagnosis not present

## 2013-10-09 DIAGNOSIS — T50902A Poisoning by unspecified drugs, medicaments and biological substances, intentional self-harm, initial encounter: Secondary | ICD-10-CM | POA: Diagnosis present

## 2013-10-09 DIAGNOSIS — Z046 Encounter for general psychiatric examination, requested by authority: Secondary | ICD-10-CM | POA: Diagnosis present

## 2013-10-09 LAB — RAPID URINE DRUG SCREEN, HOSP PERFORMED
Amphetamines: NOT DETECTED
Barbiturates: NOT DETECTED
Benzodiazepines: NOT DETECTED
COCAINE: NOT DETECTED
OPIATES: NOT DETECTED
Tetrahydrocannabinol: NOT DETECTED

## 2013-10-09 MED ORDER — LORAZEPAM 2 MG/ML IJ SOLN
INTRAMUSCULAR | Status: AC
  Administered 2013-10-09: 2 mg via INTRAMUSCULAR
  Filled 2013-10-09: qty 1

## 2013-10-09 MED ORDER — OLANZAPINE 10 MG IM SOLR
INTRAMUSCULAR | Status: AC
  Filled 2013-10-09: qty 10

## 2013-10-09 MED ORDER — MAGNESIUM HYDROXIDE 400 MG/5ML PO SUSP: 30.0000 mL | Freq: Every day | ORAL | Status: DC | PRN

## 2013-10-09 MED ORDER — OLANZAPINE 5 MG PO TBDP
5.0000 mg | ORAL_TABLET | Freq: Two times a day (BID) | ORAL | Status: DC
  Administered 2013-10-10: 5 mg via ORAL
  Filled 2013-10-09 (×7): qty 1

## 2013-10-09 MED ORDER — ZOLPIDEM TARTRATE 5 MG PO TABS: 5.0000 mg | ORAL_TABLET | Freq: Every evening | ORAL | Status: DC | PRN

## 2013-10-09 MED ORDER — CHLORPROMAZINE HCL 100 MG PO TABS
100.0000 mg | ORAL_TABLET | Freq: Once | ORAL | Status: AC
  Administered 2013-10-09: 100 mg via ORAL
  Filled 2013-10-09: qty 1

## 2013-10-09 MED ORDER — LORAZEPAM 1 MG PO TABS: 1.0000 mg | ORAL_TABLET | Freq: Three times a day (TID) | ORAL | Status: DC | PRN

## 2013-10-09 MED ORDER — ACETAMINOPHEN 325 MG PO TABS: 650.0000 mg | ORAL_TABLET | Freq: Four times a day (QID) | ORAL | Status: DC | PRN

## 2013-10-09 MED ORDER — OLANZAPINE 10 MG PO TBDP
5.0000 mg | ORAL_TABLET | Freq: Two times a day (BID) | ORAL | Status: DC | PRN
  Filled 2013-10-09: qty 1

## 2013-10-09 MED ORDER — ACETAMINOPHEN 325 MG PO TABS: 650.0000 mg | ORAL_TABLET | ORAL | Status: DC | PRN

## 2013-10-09 MED ORDER — OLANZAPINE 10 MG PO TBDP
10.0000 mg | ORAL_TABLET | Freq: Once | ORAL | Status: DC
  Filled 2013-10-09: qty 1

## 2013-10-09 MED ORDER — DIPHENHYDRAMINE HCL 50 MG/ML IJ SOLN
50.0000 mg | Freq: Once | INTRAMUSCULAR | Status: AC
  Administered 2013-10-09: 50 mg via INTRAMUSCULAR
  Filled 2013-10-09: qty 1

## 2013-10-09 MED ORDER — HALOPERIDOL LACTATE 5 MG/ML IJ SOLN
INTRAMUSCULAR | Status: AC
  Filled 2013-10-09: qty 2

## 2013-10-09 MED ORDER — DIPHENHYDRAMINE HCL 50 MG/ML IJ SOLN
INTRAMUSCULAR | Status: AC
  Administered 2013-10-09: 50 mg
  Filled 2013-10-09: qty 1

## 2013-10-09 MED ORDER — LORAZEPAM 1 MG PO TABS
1.0000 mg | ORAL_TABLET | Freq: Three times a day (TID) | ORAL | Status: DC | PRN
  Administered 2013-10-09 – 2013-10-12 (×7): 1 mg via ORAL
  Filled 2013-10-09 (×8): qty 1

## 2013-10-09 MED ORDER — ONDANSETRON HCL 4 MG PO TABS: 4.0000 mg | ORAL_TABLET | Freq: Three times a day (TID) | ORAL | Status: DC | PRN

## 2013-10-09 MED ORDER — ALUM & MAG HYDROXIDE-SIMETH 200-200-20 MG/5ML PO SUSP: 30.0000 mL | ORAL | Status: DC | PRN

## 2013-10-09 MED ORDER — IBUPROFEN 200 MG PO TABS
600.0000 mg | ORAL_TABLET | Freq: Three times a day (TID) | ORAL | Status: DC | PRN
  Administered 2013-10-09 – 2013-10-13 (×2): 600 mg via ORAL
  Filled 2013-10-09 (×4): qty 3

## 2013-10-09 MED ORDER — IBUPROFEN 200 MG PO TABS
600.0000 mg | ORAL_TABLET | Freq: Three times a day (TID) | ORAL | Status: DC | PRN
Start: 2013-10-09 — End: 2013-10-09

## 2013-10-09 MED ORDER — OLANZAPINE 10 MG IM SOLR
5.0000 mg | Freq: Two times a day (BID) | INTRAMUSCULAR | Status: DC | PRN
  Administered 2013-10-09: 5 mg via INTRAMUSCULAR

## 2013-10-09 MED ORDER — LORAZEPAM 2 MG/ML IJ SOLN
2.0000 mg | Freq: Once | INTRAMUSCULAR | Status: AC
  Administered 2013-10-09: 2 mg via INTRAMUSCULAR
  Filled 2013-10-09: qty 1

## 2013-10-09 NOTE — ED Notes (Signed)
Patient hitting his head against nurses' station glass. Scraped his head against edge of glass trying to self harm.  Patient redirected to his room. Encouraged to rest.

## 2013-10-09 NOTE — ED Notes (Signed)
Spoke to [employer] re: ingestion of pills and follow-up.

## 2013-10-09 NOTE — Progress Notes (Signed)
1:1 NOTE: Patient continues to sleep comfortably in room. 1:1 continues for patient safety.

## 2013-10-09 NOTE — ED Notes (Signed)
[facility]

## 2013-10-09 NOTE — Tx Team (Signed)
  Interdisciplinary Treatment Plan Update   Date Reviewed:  10/09/2013  Time Reviewed:  2:25 PM  Progress in Treatment:   Attending groups:No Participating in groups: No Taking medication as prescribed: Yes  Tolerating medication: Yes Family/Significant other contact made: No Patient understands diagnosis: Yes AEB asking for help with command hallucinations and subsequent SI Discussing patient identified problems/goals with staff: Yes  See initial care plan Medical problems stabilized or resolved: Yes Denies suicidal/homicidal ideation: No  And is on 1:1 as he cannot contract for safety Patient has not harmed self or others: Yes  For review of initial/current patient goals, please see plan of care.  Estimated Length of Stay:  4-5 days  Reason for Continuation of Hospitalization: Depression Hallucinations Medication stabilization Suicidal ideation  New Problems/Goals identified:  N/A  Discharge Plan or Barriers:   return home, follow up outpt  Additional Comments:  Patient overdosed in [facility] ED with intent to hurt himself. Forwards little on assessment, positive for hallucinations. Denies homicidal ideations and substance abuse.   Attendees:  Signature: Thedore MinsMojeed Akintayo, MD 10/09/2013 2:25 PM   Signature: Richelle Itood Jamiyla Ishee, LCSW 10/09/2013 2:25 PM  Signature: Fransisca KaufmannLaura Davis, NP 10/09/2013 2:25 PM  Signature: Joslyn Devonaroline Beaudry, RN 10/09/2013 2:25 PM  Signature: Liborio NixonPatrice White, RN 10/09/2013 2:25 PM  Signature:  10/09/2013 2:25 PM  Signature:   10/09/2013 2:25 PM  Signature:    Signature:    Signature:    Signature:    Signature:    Signature:      Scribe for Treatment Team:   Richelle Itood Keagen Heinlen, LCSW  10/09/2013 2:25 PM

## 2013-10-09 NOTE — ED Notes (Signed)
Patient is confrontational, argumentative. Continues to try to put objects in his mouth. Attempted to eat paper. Complaining of AH, stomach and chest pain. Will not let staff get vital signs.   Encouragement offered. Patient remains loud and argumentative.  Q 15 safety checks continue.

## 2013-10-09 NOTE — Progress Notes (Signed)
Patient ID: [name], male   DOB: 1990-11-24, 23 y.o.   MRN: [id number] [facility] with history of schizoaffective disorder. [facility] with [facility]/o auditory hallucinations. Patient also overdosed on thorazine and Fluoxetine when in hospital lobby. Patient also attempted to cut left wrist. Patient has had multiple hospital admissions and has history of noncompliance with medication regime. [facility], patient continued to report auditory hallucinations. Patient verbalized self-harm thoughts and exhibited self-harm behaviors, including attempting to eat a glove, attempting to swallow hand sanitizer, and attempting to take staff member's scissors to harm self. Patient also exhibiting anxiety and pacing about unit. Patient placed on 1:1 per MD order for patient safety. At 1234, medicated with prns.

## 2013-10-09 NOTE — Progress Notes (Signed)
1:1 NOTE:  Patient remains agitated. Continuing to verbalize self-harm thoughts. Patient attempted to wrap scrub pants around neck. Patient also tried to stab self with colored pencil. After refusing prn Zyprexa, medication administered IM per MD order. Patient remains on 1:1 for safety.

## 2013-10-09 NOTE — Progress Notes (Signed)
Patient ID: [name], male   DOB: 05-10-1990, 23 y.o.   MRN: [id number] 1:1 notes  10/09/2013 @ 2130 D: Patient in bed sleeping since beginning of shift. Pt came up to medication window requesting medication for anxiety. Pt reports desire to be off 1:1 observation. No sign of distress noted at this time A: 1:1 observation for safety. Writer explained behavior expected to off continuous observation. Medication administered as prescribed. R: Patient seem willing to comply with behavior expectations.  Sitter at bedside. 1:1 continues
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
## 2013-10-09 NOTE — ED Notes (Signed)
Patient states the voices Dorathy DaftKayla and [name] told him to take the pills if he wants them to leave him alone. Patient states that he has a tracking device in his left wrist and that he will continue to try and get it out so that the [employer] and [employer] can't follow him. Patient states that Abilify Ativan and Thorazine help keep the voices away.

## 2013-10-09 NOTE — Consult Note (Signed)
Patient seen, evaluated by me. Patient needs inpatient psychiatric treatment for suicidal ideation and attempt

## 2013-10-09 NOTE — Progress Notes (Signed)
1:1 NOTE: Patient sleeping comfortably in room. 1:1 continues for patient safety.

## 2013-10-09 NOTE — ED Notes (Signed)
Patient refusing Zyprexa. States that he wants Ativan. Came to nurse's station and stated "Tell the doctor I want Ativan or I will murder myself". Patient states that Ativan is the only medication he feels will help him.  Q 15 safety checks continue.

## 2013-10-09 NOTE — Consult Note (Signed)
[place] Psychiatry Consult   Reason for Consult:  Intentional overdose Referring Physician:  EDP  [name] is an 23 y.o. male. Total Time spent with patient: 20 minutes  Assessment: AXIS I:  Schizoaffective Disorder AXIS II:  Deferred AXIS III:   Past Medical History  Diagnosis Date  . Asthma   . Bipolar 1 disorder   . ODD (oppositional defiant disorder)   . ADHD (attention deficit hyperactivity disorder)   . Schizophrenia   . Suicidal ideation   . Homicidal ideation   . Explosive personality disorder    AXIS IV:  other psychosocial or environmental problems, problems related to social environment and problems with primary support group AXIS V:  21-30 behavior considerably influenced by delusions or hallucinations OR serious impairment in judgment, communication OR inability to function in almost all areas  Plan:  Recommend psychiatric Inpatient admission when medically cleared.  Dr. Dwyane [name] assessed the patient and concurs with the plan.  Subjective:   [name] is a 23 y.o. male patient admitted with intentional suicide attempt.  HPI:  Patient overdosed in [facility] ED with intent to hurt himself.  Forwards little on assessment, positive for hallucinations.  Denies homicidal ideations and substance abuse. HPI Elements:   Location:  generalized. Quality:  acute. Severity:  severe. Timing:  constant. Duration:  past week. Context:  stressors.  Past Psychiatric History: Past Medical History  Diagnosis Date  . Asthma   . Bipolar 1 disorder   . ODD (oppositional defiant disorder)   . ADHD (attention deficit hyperactivity disorder)   . Schizophrenia   . Suicidal ideation   . Homicidal ideation   . Explosive personality disorder     reports that he has never smoked. He does not have any smokeless tobacco history on file. He reports that he does not drink alcohol or use illicit drugs. History reviewed. No pertinent family history.         Allergies:    Allergies  Allergen Reactions  . Carbamazepine Anaphylaxis, Other (See Comments) and Cough    Cough up blood  . Haldol [Haloperidol Lactate] Anaphylaxis and Other (See Comments)    Patient reports that he stopped taking this because of throat swelling.   Christianne Borrow [Atomoxetine] Anaphylaxis, Other (See Comments) and Cough    Cough up blood  . Other Nausea Only    Apple Juice, stomach hurts and itchy throat     ACT Assessment Complete:  Yes:    Educational Status    Risk to Self:    Risk to Others:    Abuse:    Prior Inpatient Therapy:    Prior Outpatient Therapy:    Additional Information:                    Objective: There were no vitals taken for this visit.There is no weight on file to calculate BMI. Results for orders placed during the hospital encounter of 10/08/13 (from the past 72 hour(s))  CBC     Status: Abnormal   Collection Time    10/08/13  8:50 PM      Result Value Ref Range   WBC 6.4  4.0 - 10.5 K/uL   RBC 5.80  4.22 - 5.81 MIL/uL   Hemoglobin 15.6  13.0 - 17.0 g/dL   HCT 45.5  39.0 - 52.0 %   MCV 78.4  78.0 - 100.0 fL   MCH 26.9  26.0 - 34.0 pg   MCHC 34.3  30.0 - 36.0 g/dL  RDW 12.7  11.5 - 15.5 %   Platelets 407 (*) 150 - 400 K/uL  COMPREHENSIVE METABOLIC PANEL     Status: Abnormal   Collection Time    10/08/13  8:50 PM      Result Value Ref Range   Sodium 140  137 - 147 mEq/L   Potassium 3.7  3.7 - 5.3 mEq/L   Chloride 100  96 - 112 mEq/L   CO2 23  19 - 32 mEq/L   Glucose, Bld 100 (*) 70 - 99 mg/dL   BUN 7  6 - 23 mg/dL   Creatinine, Ser 0.95  0.50 - 1.35 mg/dL   Calcium 9.3  8.4 - 10.5 mg/dL   Total Protein 8.5 (*) 6.0 - 8.3 g/dL   Albumin 4.6  3.5 - 5.2 g/dL   AST 29  0 - 37 U/L   ALT 21  0 - 53 U/L   Alkaline Phosphatase 79  39 - 117 U/L   Total Bilirubin 0.5  0.3 - 1.2 mg/dL   GFR calc non Af Amer >90  >90 mL/min   GFR calc Af Amer >90  >90 mL/min   Comment: (NOTE)     The eGFR has been calculated using the CKD EPI  equation.     This calculation has not been validated in all clinical situations.     eGFR's persistently <90 mL/min signify possible Chronic Kidney     Disease.   Anion gap 17 (*) 5 - 15  ETHANOL     Status: None   Collection Time    10/08/13  8:50 PM      Result Value Ref Range   Alcohol, Ethyl (B) <11  0 - 11 mg/dL   Comment:            LOWEST DETECTABLE LIMIT FOR     SERUM ALCOHOL IS 11 mg/dL     FOR MEDICAL PURPOSES ONLY  ACETAMINOPHEN LEVEL     Status: None   Collection Time    10/08/13  8:50 PM      Result Value Ref Range   Acetaminophen (Tylenol), Serum <15.0  10 - 30 ug/mL   Comment:            THERAPEUTIC CONCENTRATIONS VARY     SIGNIFICANTLY. A RANGE OF 10-30     ug/mL MAY BE AN EFFECTIVE     CONCENTRATION FOR MANY PATIENTS.     HOWEVER, SOME ARE BEST TREATED     AT CONCENTRATIONS OUTSIDE THIS     RANGE.     ACETAMINOPHEN CONCENTRATIONS     >150 ug/mL AT 4 HOURS AFTER     INGESTION AND >50 ug/mL AT 12     HOURS AFTER INGESTION ARE     OFTEN ASSOCIATED WITH TOXIC     REACTIONS.  SALICYLATE LEVEL     Status: Abnormal   Collection Time    10/08/13  8:50 PM      Result Value Ref Range   Salicylate Lvl <2.3 (*) 2.8 - 20.0 mg/dL  URINE RAPID DRUG SCREEN (HOSP PERFORMED)     Status: None   Collection Time    10/09/13  7:45 AM      Result Value Ref Range   Opiates NONE DETECTED  NONE DETECTED   Cocaine NONE DETECTED  NONE DETECTED   Benzodiazepines NONE DETECTED  NONE DETECTED   Amphetamines NONE DETECTED  NONE DETECTED   Tetrahydrocannabinol NONE DETECTED  NONE DETECTED   Barbiturates NONE DETECTED  NONE DETECTED   Comment:            DRUG SCREEN FOR MEDICAL PURPOSES     ONLY.  IF CONFIRMATION IS NEEDED     FOR ANY PURPOSE, NOTIFY LAB     WITHIN 5 DAYS.                LOWEST DETECTABLE LIMITS     FOR URINE DRUG SCREEN     Drug Class       Cutoff (ng/mL)     Amphetamine      1000     Barbiturate      200     Benzodiazepine   937     Tricyclics       169      Opiates          300     Cocaine          300     THC              50   Labs are reviewed and are pertinent for no medical issues noted.  Current Facility-Administered Medications  Medication Dose Route Frequency Provider Last Rate Last Dose  . diphenhydrAMINE (BENADRYL) 50 MG/ML injection           . haloperidol lactate (HALDOL) 5 MG/ML injection           . LORazepam (ATIVAN) 2 MG/ML injection             Psychiatric Specialty Exam:     There were no vitals taken for this visit.There is no weight on file to calculate BMI.  General Appearance: Casual  Eye Contact::  Fair  Speech:  Slow  Volume:  Decreased  Mood:  Depressed  Affect:  Congruent  Thought Process:  Coherent  Orientation:  Full (Time, Place, and Person)  Thought Content:  Hallucinations: Auditory  Suicidal Thoughts:  Yes.  with intent/plan  Homicidal Thoughts:  No  Memory:  Immediate;   Fair Recent;   Fair Remote;   Fair  Judgement:  Poor  Insight:  Fair  Psychomotor Activity:  Decreased  Concentration:  Fair  Recall:  Mobile: Fair  Akathisia:  No  Handed:  Right  AIMS (if indicated):     Assets:  Catering manager Housing Leisure Time Physical Health Resilience Social Support  Sleep:      Musculoskeletal: Strength & Muscle Tone: within normal limits Gait & Station: normal Patient leans: N/A  Treatment Plan Summary: Daily contact with patient to assess and evaluate symptoms and progress in treatment Medication management; Thorazine will restart tomorrow after his overdose clearance, admit to inpatient hospital at [facility] for stabilization.  Waylan Boga, [place] 10/09/2013 12:31 PM
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
## 2013-10-09 NOTE — [facility] Group Notes (Signed)
[facility] LCSW Group Therapy  10/09/2013  1:05 PM  Type of Therapy:  Group therapy  Participation Level:  Invited.  Chose to not attend  Summary of Progress/Problems:  Chaplain was here to lead a group on themes of hope and courage.  Daryel Geraldorth, Joenathan Sakuma B 10/09/2013 1:49 PM

## 2013-10-09 NOTE — ED Notes (Signed)
[id number] Expected date: 10/08/13 Expected time:  Means of arrival:  Comments: [facility]

## 2013-10-09 NOTE — Progress Notes (Signed)
Patient ambulating in hall greeting other patients. He then looked at Clinical research associatewriter while she was walking by and began complaining of stomach pain. Patient demanding to see MD stating, " if I don't see doctor I'm going to bang my head against the wall." Patient administered Ibuprofen for pain level of 10. Patient asleep when writer returned with a heat pack. Pt remains on 1:1 for safety.

## 2013-10-09 NOTE — Tx Team (Signed)
Initial Interdisciplinary Treatment Plan   PATIENT STRESSORS: Financial difficulties Medication change or noncompliance Traumatic event Cognitive limitations   PROBLEM LIST: Problem List/Patient Goals Date to be addressed Date deferred Reason deferred Estimated date of resolution  Self-Harm thought/Behaviors 10/09/2013    D/C  Auditory hallucinations 10/09/2013    D/C  Noncompliance with treatment regime 10/09/2013    D/C                                       DISCHARGE CRITERIA:  Improved stabilization in mood, thinking, and/or behavior Motivation to continue treatment in a less acute level of care Need for constant or close observation no longer present Reduction of life-threatening or endangering symptoms to within safe limits Verbal commitment to aftercare and medication compliance  PRELIMINARY DISCHARGE PLAN: Referrals indicated:  possible ACT Team Referral  PATIENT/FAMIILY INVOLVEMENT: This treatment plan has been presented to and reviewed with the patient, [name], .  The patient and family have been given the opportunity to ask questions and make suggestions.  [name], [name] 10/09/2013, 3:26 PM

## 2013-10-10 ENCOUNTER — Encounter (HOSPITAL_COMMUNITY): Payer: Self-pay | Admitting: Psychiatry

## 2013-10-10 DIAGNOSIS — F259 Schizoaffective disorder, unspecified: Principal | ICD-10-CM

## 2013-10-10 MED ORDER — LORAZEPAM 2 MG/ML IJ SOLN
2.0000 mg | Freq: Once | INTRAMUSCULAR | Status: AC
  Administered 2013-10-10: 2 mg via INTRAMUSCULAR

## 2013-10-10 MED ORDER — OLANZAPINE 10 MG IM SOLR
10.0000 mg | Freq: Three times a day (TID) | INTRAMUSCULAR | Status: DC | PRN
  Filled 2013-10-10: qty 10

## 2013-10-10 MED ORDER — LORAZEPAM 2 MG/ML IJ SOLN
INTRAMUSCULAR | Status: AC
  Administered 2013-10-10: 2 mg via INTRAMUSCULAR
  Filled 2013-10-10: qty 1

## 2013-10-10 MED ORDER — OLANZAPINE 5 MG PO TABS
5.0000 mg | ORAL_TABLET | Freq: Every day | ORAL | Status: DC
Start: 2013-10-10 — End: 2013-10-10

## 2013-10-10 MED ORDER — OLANZAPINE 10 MG PO TBDP
10.0000 mg | ORAL_TABLET | Freq: Three times a day (TID) | ORAL | Status: DC | PRN
  Administered 2013-10-10: 10 mg via ORAL
  Filled 2013-10-10: qty 1

## 2013-10-10 MED ORDER — CHLORPROMAZINE HCL 25 MG/ML IJ SOLN
50.0000 mg | Freq: Once | INTRAMUSCULAR | Status: AC
  Administered 2013-10-10: 50 mg via INTRAMUSCULAR
  Filled 2013-10-10 (×2): qty 2

## 2013-10-10 MED ORDER — LORAZEPAM 1 MG PO TABS
1.0000 mg | ORAL_TABLET | Freq: Once | ORAL | Status: AC
  Administered 2013-10-10: 1 mg via ORAL

## 2013-10-10 MED ORDER — OLANZAPINE 10 MG IM SOLR: 10.0000 mg | Freq: Once | INTRAMUSCULAR | Status: DC

## 2013-10-10 MED ORDER — LORAZEPAM 2 MG/ML IJ SOLN
2.0000 mg | Freq: Once | INTRAMUSCULAR | Status: AC
  Administered 2013-10-09 – 2013-10-10 (×2): 2 mg via INTRAMUSCULAR
  Filled 2013-10-10: qty 1

## 2013-10-10 MED ORDER — DIPHENHYDRAMINE HCL 50 MG/ML IJ SOLN
50.0000 mg | Freq: Once | INTRAMUSCULAR | Status: AC
  Administered 2013-10-10: 50 mg via INTRAMUSCULAR
  Filled 2013-10-10: qty 1

## 2013-10-10 MED ORDER — DIPHENHYDRAMINE HCL 50 MG/ML IJ SOLN
INTRAMUSCULAR | Status: AC
  Administered 2013-10-10: 50 mg via INTRAMUSCULAR
  Filled 2013-10-10: qty 1

## 2013-10-10 MED ORDER — OLANZAPINE 10 MG PO TBDP
10.0000 mg | ORAL_TABLET | Freq: Three times a day (TID) | ORAL | Status: DC | PRN
  Administered 2013-10-11: 10 mg via ORAL
  Filled 2013-10-10 (×3): qty 1

## 2013-10-10 NOTE — Progress Notes (Signed)
Patient ID: [name], male   DOB: 1990/10/18, 23 y.o.   MRN: [id number] 1:1 notes  10/10/2013 @ 0530 D: Patient in bed sleeping. Respiration regular and unlabored. No sign of distress noted at this time A: 1:1 observation for safety R: Patient is safe. Sitter at bedside. 1:1 continues

## 2013-10-10 NOTE — Progress Notes (Signed)
Patient ID: [name], male   DOB: Nov 05, 1990, 23 y.o.   MRN: [id number] 10-10-13 @ 0930 nursing 1:1 note: D: pt came to the medication window. RN introduced himself. A: RN educated the pt about the medication he was getting ready to take, Zyprexa. RN ask the pt if he needed anything else. R: he stated no. On his inventory sheet he wrote:  Slept fair, no sleep med requested, appetite good, energy low, concentration good, depression, hopelessness and anxiety all at 10. He is having some passive SI, but is able to contract for safety. This pt continues to have auditory hallucinations and continues to gesture indicating he could hurt himself. RN will continue to monitor and Q 15 min ck's continue on the hall, as well as the patient's 1:1. The 1:1 continues.

## 2013-10-10 NOTE — Progress Notes (Signed)
Patient ID: Mare LoanRazell Biddle, male   DOB: 11-02-1990, 23 y.o.   MRN: [id number] Psychoeducational Group Note  Date:  10/10/2013 Time:0900am  Group Topic/Focus:  Identifying Needs:   The focus of this group is to help patients identify their personal needs that have been historically problematic and identify healthy behaviors to address their needs.  Participation Level:  None  Participation Quality:  Inattentive  Affect:  Flat and Resistant  Cognitive:  Disorganized  Insight:  Limited  Engagement in Group:  Limited  Additional Comments:  Inventory group   [name], Hillman Attig Brooks 10/10/2013,10:13 AM

## 2013-10-10 NOTE — Progress Notes (Signed)
Patient ID: Mare LoanRazell Woodring, male   DOB: 04/24/1990, 23 y.o.   MRN: [id number] D: Client in the walking the hall with sitter earlier this evening, later went to his room and began screaming that his stomach hurts, unable to verbalize on pain scale, says he hadn't had BM in 3 weeks. Client clearly being manipulative. A: Writer assessed abdomen soft, not distended. Writer pulled MOM and Ibuprofen for complaints of pain. Staff will monitor 1:1 for safety. R: client refused medication. Client is safe on the unit.

## 2013-10-10 NOTE — Progress Notes (Signed)
Psychoeducational Group Note  Date:  10/10/2013 Time:  0312  Group Topic/Focus:  Wrap-Up Group:   The focus of this group is to help patients review their daily goal of treatment and discuss progress on daily workbooks.  Participation Level: Did Not Attend  Participation Quality:  Not Applicable  Affect:  Not Applicable  Cognitive:  Not Applicable  Insight:  Not Applicable  Engagement in Group: Not Applicable  Additional Comments:  The [patient did not attend group since he was sleeping in his bedroom.   Shatoya Roets S 10/10/2013, 3:12 AM

## 2013-10-10 NOTE — [facility] Group Notes (Signed)
[facility] Group Notes:  (Clinical Social Work)  10/10/2013  11:15AM - 12:00PM  Summary of Progress/Problems:   The main focus of today's process group was to discuss feelings related to being hospitalized, as well as the difference between "being" and "having" a mental health diagnosis.  It was agreed in general by the group that it would be preferable to avoid future hospitalizations, and we discussed means of doing that.  As a follow-up, problems with adhering to medication recommendations were discussed.  The patient expressed that he does not want to be in the hospital, and relayed to the group the story of why he was hospitalized.  He stated that he must have been delusional at the time, thinking that he needed to give medication to people, particularly children.  He kept moving around the room, talking the whole time, and was difficult to redirect.  He told his diagnoses multiple times to the group, and included among them sociopath.  He asked the group leader several times to express an opinion about whether he is likely to become a serial killer, and said he thinks about it.  He was in and out of other room multiple times and did not understand, could not be told even by his 1:1 MHT, how this was disruptive to the group.    Type of Therapy:  Group Therapy - Process  Participation Level:  Active  Participation Quality:  Intrusive and Monopolizing  Affect:  Flat and Irritable  Cognitive:  Disorganized  Insight:  Limited  Engagement in Therapy:  Limited  Modes of Intervention:  Exploration, Discussion  Ambrose MantleMareida Grossman-Orr, LCSW 10/10/2013, 12:30

## 2013-10-10 NOTE — Progress Notes (Signed)
The focus of this group is to help patients review their daily goal of treatment and discuss progress on daily workbooks. Pt did not attend the evening group. 

## 2013-10-10 NOTE — Progress Notes (Signed)
Patient ID: [name], male   DOB: 1990-12-10, 23 y.o.   MRN: [id number] Psychoeducational Group Note  Date:  10/10/2013 Time:0930am  Group Topic/Focus:  Identifying Needs:   The focus of this group is to help patients identify their personal needs that have been historically problematic and identify healthy behaviors to address their needs.  Participation Level:  Minimal  Participation Quality:  Inattentive  Affect:  Flat  Cognitive:  Disorganized  Insight:  Limited  Engagement in Group:  Limited  Additional Comments:  Healthy coping skills.   [name], Mazal Ebey Brooks 10/10/2013,10:14 AM
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
## 2013-10-10 NOTE — Progress Notes (Signed)
Patient ID: [name], male   DOB: [birth year], 23 y.o.   MRN: [id number] 1:1 notes  10/10/2013 @ 0130 D: Patient in bed sleeping. Respiration regular and unlabored. No sign of distress noted at this time A: 1:1 observation for safety R: Patient is asleep. Sitter at bedside. 1:1 continues
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
## 2013-10-10 NOTE — Progress Notes (Signed)
Patient ID: [name], male   DOB: [birth year], 23 y.o.   MRN: [id number] 10-10-13 @ 1330 nursing 1:1 note: D: this pt has begun some attention seeking behaviors. He has eaten a kleenex, cut himself with his fingernail on his left wrist. A: RN has obtained orders for ativan 1 mg once at 1015, Zyprexa 10 mg po, and  ativan 2 mg at 1351 and has noticed little change in his behaviors.R: He continues to "act out" and challenge staff insisting that he have only  male staff work with him. The 1:1 continues.
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
## 2013-10-11 DIAGNOSIS — T50902A Poisoning by unspecified drugs, medicaments and biological substances, intentional self-harm, initial encounter: Secondary | ICD-10-CM

## 2013-10-11 DIAGNOSIS — T50901A Poisoning by unspecified drugs, medicaments and biological substances, accidental (unintentional), initial encounter: Secondary | ICD-10-CM

## 2013-10-11 DIAGNOSIS — F1910 Other psychoactive substance abuse, uncomplicated: Secondary | ICD-10-CM

## 2013-10-11 MED ORDER — DIPHENHYDRAMINE HCL 50 MG/ML IJ SOLN
INTRAMUSCULAR | Status: AC
  Administered 2013-10-11: 50 mg via INTRAMUSCULAR
  Filled 2013-10-11: qty 1

## 2013-10-11 MED ORDER — BENZTROPINE MESYLATE 1 MG PO TABS
1.0000 mg | ORAL_TABLET | Freq: Two times a day (BID) | ORAL | Status: DC
  Administered 2013-10-11 – 2013-10-13 (×4): 1 mg via ORAL
  Filled 2013-10-11 (×8): qty 1

## 2013-10-11 MED ORDER — DIPHENHYDRAMINE HCL 50 MG/ML IJ SOLN: 50.0000 mg | Freq: Three times a day (TID) | INTRAMUSCULAR | Status: DC | PRN

## 2013-10-11 MED ORDER — CHLORPROMAZINE HCL 25 MG/ML IJ SOLN
50.0000 mg | Freq: Once | INTRAMUSCULAR | Status: AC
  Administered 2013-10-11: 50 mg via INTRAMUSCULAR
  Filled 2013-10-11: qty 2

## 2013-10-11 MED ORDER — DIPHENHYDRAMINE HCL 50 MG/ML IJ SOLN
50.0000 mg | Freq: Once | INTRAMUSCULAR | Status: AC
  Administered 2013-10-11: 50 mg via INTRAMUSCULAR
  Filled 2013-10-11: qty 1

## 2013-10-11 MED ORDER — LORAZEPAM 2 MG/ML IJ SOLN
2.0000 mg | Freq: Once | INTRAMUSCULAR | Status: AC
  Administered 2013-10-11: 2 mg via INTRAMUSCULAR

## 2013-10-11 MED ORDER — LORAZEPAM 2 MG/ML IJ SOLN
INTRAMUSCULAR | Status: AC
  Administered 2013-10-11: 2 mg via INTRAMUSCULAR
  Filled 2013-10-11: qty 1

## 2013-10-11 MED ORDER — LORAZEPAM 2 MG/ML IJ SOLN: 2.0000 mg | Freq: Three times a day (TID) | INTRAMUSCULAR | Status: DC | PRN

## 2013-10-11 MED ORDER — CHLORPROMAZINE HCL 25 MG PO TABS
25.0000 mg | ORAL_TABLET | Freq: Two times a day (BID) | ORAL | Status: DC
  Administered 2013-10-11 – 2013-10-12 (×3): 25 mg via ORAL
  Filled 2013-10-11 (×8): qty 1

## 2013-10-11 MED ORDER — DIVALPROEX SODIUM ER 250 MG PO TB24
250.0000 mg | ORAL_TABLET | Freq: Two times a day (BID) | ORAL | Status: DC
  Administered 2013-10-11 – 2013-10-12 (×2): 250 mg via ORAL
  Filled 2013-10-11 (×5): qty 1

## 2013-10-11 MED ORDER — DIPHENHYDRAMINE HCL 50 MG/ML IJ SOLN
INTRAMUSCULAR | Status: AC
  Filled 2013-10-11: qty 1

## 2013-10-11 MED ORDER — CHLORPROMAZINE HCL 25 MG/ML IJ SOLN: 50.0000 mg | Freq: Three times a day (TID) | INTRAMUSCULAR | Status: DC | PRN

## 2013-10-11 NOTE — Progress Notes (Signed)
[facility] Group Notes:  (Nursing/MHT/Case Management/Adjunct)  Date:  10/11/2013  Time:  10:07 PM  Type of Therapy:  Group Therapy  Participation Level:  Active  Participation Quality:  Attentive  Affect:  Appropriate  Cognitive:  Alert  Insight:  Good  Engagement in Group:  Engaged  Modes of Intervention:  Discussion  Summary of Progress/Problems: Patient stated that he had a bad and terrible day. Says that his anger got the best of him and he threw the coffee pitcher and some water across the room. The patient did apologize to the group for his behavior. The patients goal is to get out of the hospital and work on his anger issues.   Percell LocusJOHNSON,TAWANA 10/11/2013, 10:07 PM

## 2013-10-11 NOTE — Progress Notes (Signed)
1:1 1000 note  Pt remains on 1:1 for safety.  He has been asleep all morning and he just woke up about 0950.  He was eating a snack while he was answering the questions on his self-inventory.  He denied any depression, hopelessness or anxiety.  He did take a prn ativan at [id number] and was offered zyprexa which his refused.  He did admit to hearing voices and seeing "people" he stated, "I hear voices saying kill people hurt people and I see and hear Jae DireKate and [name]" He remains on 1:1 for safety.

## 2013-10-11 NOTE — Progress Notes (Signed)
D. Pt up in the dayroom at this time, finishing dinner that was brought for him. Pt did shower and shave this afternoon and has been visible. Pt has been interacting with peers and staff and has not had any outbursts the past few hours. Pt has been responding ok to re-direction provided for him by staff. A. Support and encouragement provided, 1-1 continued at this time. R. Safety maintained, will continue to monitor.

## 2013-10-11 NOTE — Progress Notes (Signed)
Patient is lying in bed awake at this time. He has been intrusive and somatic requiring frequent redirection and support. He asks this Clinical research associatewriter if I am part of the [employer] or [employer] and inspects my name tag however presentation is incongruent with words. He endorses AVH and becomes agitated when asked about them. "What are you trying to do? Broadcast my thoughts?" Pt does not have any scheduled medications as they were given earlier this evening. Pt's behavior does not rise to the level of requiring IM PRNs and he will continued to be monitored 1:1 by staff. He remains safe at this time. Lawrence MarseillesFriedman, Elene Downum Eakes

## 2013-10-11 NOTE — [facility] Group Notes (Signed)
[facility] Group Notes:  (Nursing/MHT/Case Management/Adjunct)  Date:  10/11/2013  Time:  12:04 PM  Type of Therapy:  Psychoeducational Skills  Participation Level:  Did Not Attend  Participation Quality:  did not attend  Affect:  did not attend  Cognitive:  did not attend  Insight:  None  Engagement in Group:  did not attend  Modes of Intervention:  did not attend  Summary of Progress/Problems:  [name], [name] Gail 10/11/2013, 12:04 PM

## 2013-10-11 NOTE — Progress Notes (Signed)
Nursing 1:1 Note  D: Pt resting, eyes closed, respirations even and unlabored. Sitter at bedside.  No distress noted. A: Continue 1:1 observation for patient safety. R: Pt remains safe on the unit.

## 2013-10-11 NOTE — [facility] Group Notes (Signed)
[facility] Group Notes:  (Clinical Social Work)  10/11/2013   11:15am-12:00pm  Summary of Progress/Problems:  The main focus of today's process group was to listen to a variety of genres of music and to identify that different types of music provoke different responses.  The patient then was able to identify personally what was soothing for them, as well as energizing.  Handouts were used to record feelings evoked, as well as how patient can personally use this knowledge in sleep habits, with depression, and with other symptoms.  The patient was quite disruptive in the early part of group, talking continuously over the music and wandering around talking to other group members.  He was in and out of the group numerous times.  Two group members became angry with each other and went to the hall to try to fight, and he tried to access them "to calm them down, they're my friends."  When patients in the hall were calmed down by other staff, he insisted on leaving the room once again.  Later he returned and sat down, listened to the music and said different types of music made him feel calm.  He eventually seemed to sleep.  Type of Therapy:  Music Therapy   Participation Level:  Active  Participation Quality:  Intrusive and Inattentive  Affect:  Blunted  Cognitive:  Hallucinations  Insight:  Limited  Engagement in Therapy:  Improving  Modes of Intervention:   Activity, Exploration  Ambrose MantleMareida Grossman-Orr, LCSW 10/11/2013, 12:30pm

## 2013-10-11 NOTE — Progress Notes (Signed)
1:1 progress note Pt denied any depression, hopelessness or anxiety on his self-inventory.  He refused to answer some of the questions on his self-inventory or to sign.  He was given injections or thorazine 50 mg ativan 2 mg and benadryl 50 mg all IM around 1100 due to his violent outbursts and threats to staff and some of his peers. After lunch, he became angry and threw his gatorade in his floor at his bedside. He refused to clean it up so housekeeping was called. He did go into the dayroom and threw the coffee container across the room.  Pt remains on 1:1 for safety.

## 2013-10-11 NOTE — [facility] Group Notes (Signed)
0900 nursing orientation group  The focus of this group is to educate the patient on the purpose and policies of crisis stabilization and provide a format to answer questions about their admission.  The group details unit policies and expectations of patients while admitted.   Pt did not attend. 

## 2013-10-11 NOTE — Progress Notes (Signed)
1:1 Nursing Note  D:  Pt in hallway accompanied by sitter.  Pt is requesting to speak with a male doctor in hopes of being discharged.  Pt is denying SI/HI/AVH, stating "I'm on my meds now."  A:  Support and encouragement offered to pt.  Advised pt that he would be able to speak with a doctor today to express his concerns.  Continue 1:1 for pt safety.  R:  Pt remains safe.

## 2013-10-11 NOTE — Progress Notes (Signed)
Patient ID: [name], male   DOB: May 19, 1990, 23 y.o.   MR[facility] [id number] [facility]  10/11/2013 1:18 PM [name]  MRN:  [id number] Subjective: " I don't wanna talk right now".   Objective: Patient seen and chart reviewed. Pt is not engaging with providers unless they are male; pt is refusing interaction and treatment. Pt did agree to take some injections containing Ativan after becoming very belligerent with staff and other patients. Pt has calmed down somewhat after receiving one-time-dose medications. While pt was upset, he was very impulsive, entering the rooms of other patients trying to wake them, shouting at other patients, and being noncompliant with staff commands. Pt has poor impulse control although much of this is likely behavioral rather than secondary to psychosis. Pt is constantly testing the limits of staff members and gaging their reactions to his behaviors to determine how far he can go.    Diagnosis:   DSM5: Schizophrenia Disorders:  Delusional Disorder (297.1) and  Psychotic Disorder (298.8) Obsessive-Compulsive Disorders:   Trauma-Stressor Disorders:   Substance/Addictive Disorders:   Depressive Disorders:  Disruptive Mood Dysregulation Disorder (296.99) Total Time spent with patient: 30 minutes  Axis I: Drug overdose, intentional  Axis II: Cluster B Traits Axis III:  Past Medical History  Diagnosis Date  . Asthma   . Bipolar 1 disorder   . ODD (oppositional defiant disorder)   . ADHD (attention deficit hyperactivity disorder)   . Schizophrenia   . Suicidal ideation   . Homicidal ideation   . Explosive personality disorder    Axis IV: other psychosocial or environmental problems and problems related to social environment  ADL's:  Intact  Sleep: Fair  Appetite:  Fair  Suicidal Ideation: yes Plan:  denies Intent:  denies Means:  denies Homicidal Ideation:   AEB (as evidenced by):  Psychiatric Specialty Exam: Physical Exam   Psychiatric: His mood appears anxious. His affect is labile. His speech is rapid and/or pressured. He is aggressive and actively hallucinating. Thought content is paranoid and delusional. Cognition and memory are normal. He expresses impulsivity. He exhibits a depressed mood. He expresses suicidal ideation.    Review of Systems  Constitutional: Negative.   HENT: Negative.   Eyes: Negative.   Respiratory: Negative.   Cardiovascular: Negative.   Gastrointestinal: Negative.   Genitourinary: Negative.   Musculoskeletal: Negative.   Skin: Negative.   Neurological: Negative.   Endo/Heme/Allergies: Negative.   Psychiatric/Behavioral: Positive for depression, suicidal ideas and hallucinations. The patient is nervous/anxious.     Blood pressure 91/51, pulse 89, temperature 97.9 F (36.6 C), temperature source Oral, resp. rate 18.There is no weight on file to calculate BMI.  General Appearance: Casual  Eye Contact::  Good  Speech:  Pressured  Volume:  Increased  Mood:  Angry and Irritable  Affect:  Labile  Thought Process:  Circumstantial and Disorganized  Orientation:  Full (Time, Place, and Person)  Thought Content:  UTO, refused to discuss  Suicidal Thoughts:  UTO, refused to discuss  Homicidal Thoughts:  UTO, refused to discuss  Memory:  Immediate;   Fair Recent;   Fair Remote;   Fair  Judgement:  Impaired  Insight:  Shallow  Psychomotor Activity:  Increased  Concentration:  Fair  Recall:  [employer]Fair  Fund of Knowledge:Fair  Language: Good  Akathisia:  No  Handed:  Right  AIMS (if indicated):     Assets:  Communication Skills Desire for Improvement Physical Health  Sleep:  Number of Hours: 5.25  Musculoskeletal: Strength & Muscle Tone: within normal limits Gait & Station: normal Patient leans: N/A  Current Medications: Current Facility-Administered Medications  Medication Dose Route Frequency Provider Last Rate Last Dose  . acetaminophen (TYLENOL) tablet 650 mg  650 mg  Oral Q4H PRN Nanine Means, NP      . alum & mag hydroxide-simeth (MAALOX/MYLANTA) 200-200-20 MG/5ML suspension 30 mL  30 mL Oral Q4H PRN Nanine Means, NP      . diphenhydrAMINE (BENADRYL) 50 MG/ML injection           . ibuprofen (ADVIL,MOTRIN) tablet 600 mg  600 mg Oral Q8H PRN Nanine Means, NP   600 mg at 10/09/13 1848  . LORazepam (ATIVAN) tablet 1 mg  1 mg Oral Q8H PRN Nanine Means, NP   1 mg at 10/11/13 0956  . magnesium hydroxide (MILK OF MAGNESIA) suspension 30 mL  30 mL Oral Daily PRN Nanine Means, NP      . OLANZapine zydis (ZYPREXA) disintegrating tablet 10 mg  10 mg Oral Q8H PRN Beau Fanny, FNP       Or  . OLANZapine (ZYPREXA) injection 10 mg  10 mg Intramuscular Q8H PRN Beau Fanny, FNP        Lab Results: No results found for this or any previous visit (from the past 48 hour(s)).  Physical Findings: AIMS: Facial and Oral Movements Muscles of Facial Expression: None, normal Lips and Perioral Area: None, normal Jaw: None, normal Tongue: None, normal,Extremity Movements Upper (arms, wrists, hands, fingers): None, normal Lower (legs, knees, ankles, toes): None, normal, Trunk Movements Neck, shoulders, hips: None, normal, Overall Severity Severity of abnormal movements (highest score from questions above): None, normal Incapacitation due to abnormal movements: None, normal Patient's awareness of abnormal movements (rate only patient's report): No Awareness, Dental Status Current problems with teeth and/or dentures?: No Does patient usually wear dentures?: No  CIWA:    COWS:     Treatment Plan Summary: Daily contact with patient to assess and evaluate symptoms and progress in treatment Medication management  Plan: Review of chart, vital signs, medications, and notes.  1-Individual and group therapy  2-Medication management for depression and anxiety: Medications reviewed with the patient and she stated no untoward effects, unchanged. 3-Coping skills for depression,  anxiety  4-Continue crisis stabilization and management  5-Address health issues--monitoring vital signs, stable  6-Treatment plan in progress to prevent relapse of depression and anxiety    Medical Decision Making Problem Points:  Established problem, worsening (2), Review of last therapy session (1) and Review of psycho-social stressors (1) Data Points:  Order Aims Assessment (2) Review or order clinical lab tests (1) Review of medication regiment & side effects (2)  I certify that inpatient services furnished can reasonably be expected to improve the patient's condition.   Constance Haw C,FNP-BC 10/11/2013, 1:18 PM I agree with assessment and plan Madie Reno A. Dub Mikes, M.D.

## 2013-10-12 MED ORDER — TRAZODONE HCL 50 MG PO TABS
50.0000 mg | ORAL_TABLET | Freq: Every evening | ORAL | Status: DC | PRN
  Administered 2013-10-12: 50 mg via ORAL
  Filled 2013-10-12 (×7): qty 1

## 2013-10-12 MED ORDER — LORAZEPAM 2 MG/ML IJ SOLN
2.0000 mg | INTRAMUSCULAR | Status: DC | PRN
  Filled 2013-10-12: qty 1

## 2013-10-12 MED ORDER — LORAZEPAM 1 MG PO TABS: 1.0000 mg | ORAL_TABLET | Freq: Four times a day (QID) | ORAL | Status: DC | PRN

## 2013-10-12 MED ORDER — DIVALPROEX SODIUM ER 500 MG PO TB24
500.0000 mg | ORAL_TABLET | Freq: Two times a day (BID) | ORAL | Status: DC
  Administered 2013-10-12 – 2013-10-13 (×2): 500 mg via ORAL
  Filled 2013-10-12 (×5): qty 1

## 2013-10-12 MED ORDER — DIPHENHYDRAMINE HCL 25 MG PO CAPS: 50.0000 mg | ORAL_CAPSULE | Freq: Three times a day (TID) | ORAL | Status: DC | PRN

## 2013-10-12 MED ORDER — CHLORPROMAZINE HCL 50 MG PO TABS
50.0000 mg | ORAL_TABLET | Freq: Once | ORAL | Status: AC
  Administered 2013-10-12: 50 mg via ORAL
  Filled 2013-10-12: qty 1

## 2013-10-12 MED ORDER — DIPHENHYDRAMINE HCL 50 MG PO CAPS
100.0000 mg | ORAL_CAPSULE | Freq: Once | ORAL | Status: DC
  Filled 2013-10-12: qty 2

## 2013-10-12 MED ORDER — CHLORPROMAZINE HCL 50 MG PO TABS
50.0000 mg | ORAL_TABLET | Freq: Three times a day (TID) | ORAL | Status: DC | PRN
  Administered 2013-10-12: 50 mg via ORAL
  Filled 2013-10-12: qty 1

## 2013-10-12 MED ORDER — LORAZEPAM 1 MG PO TABS: 1.0000 mg | ORAL_TABLET | Freq: Three times a day (TID) | ORAL | Status: DC | PRN

## 2013-10-12 MED ORDER — DIPHENHYDRAMINE HCL 25 MG PO CAPS
ORAL_CAPSULE | ORAL | Status: AC
  Filled 2013-10-12: qty 4

## 2013-10-12 MED ORDER — CHLORPROMAZINE HCL 100 MG PO TABS
ORAL_TABLET | ORAL | Status: AC
Start: 2013-10-12 — End: 2013-10-13
  Filled 2013-10-12: qty 1

## 2013-10-12 MED ORDER — ZIPRASIDONE MESYLATE 20 MG IM SOLR
10.0000 mg | INTRAMUSCULAR | Status: DC
  Filled 2013-10-12 (×2): qty 20

## 2013-10-12 MED ORDER — CHLORPROMAZINE HCL 50 MG PO TABS
100.0000 mg | ORAL_TABLET | Freq: Four times a day (QID) | ORAL | Status: DC | PRN
  Filled 2013-10-12: qty 4

## 2013-10-12 MED ORDER — DIPHENHYDRAMINE HCL 25 MG PO CAPS
50.0000 mg | ORAL_CAPSULE | Freq: Three times a day (TID) | ORAL | Status: DC | PRN
  Administered 2013-10-12: 50 mg via ORAL
  Filled 2013-10-12 (×2): qty 2

## 2013-10-12 MED ORDER — DIPHENHYDRAMINE HCL 25 MG PO CAPS: 100.0000 mg | ORAL_CAPSULE | Freq: Four times a day (QID) | ORAL | Status: DC | PRN

## 2013-10-12 MED ORDER — DIPHENHYDRAMINE HCL 50 MG/ML IJ SOLN
INTRAMUSCULAR | Status: AC
  Filled 2013-10-12: qty 1

## 2013-10-12 MED ORDER — DIPHENHYDRAMINE HCL 50 MG/ML IJ SOLN
50.0000 mg | INTRAMUSCULAR | Status: AC
  Administered 2013-10-12: 50 mg via INTRAMUSCULAR
  Filled 2013-10-12 (×2): qty 1

## 2013-10-12 MED ORDER — ZIPRASIDONE MESYLATE 20 MG IM SOLR
INTRAMUSCULAR | Status: AC
  Filled 2013-10-12: qty 20

## 2013-10-12 MED ORDER — LORAZEPAM 1 MG PO TABS
2.0000 mg | ORAL_TABLET | ORAL | Status: DC | PRN
  Administered 2013-10-12: 2 mg via ORAL
  Filled 2013-10-12: qty 2

## 2013-10-12 NOTE — [facility] Group Notes (Signed)
[facility]   10/12/2013 3:07 PM  Participation Quality:  Minimal  Mood/Affect:  Flat  Depression Rating:  denies  Anxiety Rating:  denies  Thoughts of Suicide:  No Will you contract for safety?   NA  Current AVH:  No  Plan for Discharge/Comments:  States he is doing fine, is happy with his current meds and has been working with his ACT team Dr for med adjustments.  Talks with his head down; minimal eye contact.    Transportation Means: ACT team  Supports: ACT team  Daryel GeraldNorth, Edgar Corrigan B

## 2013-10-12 NOTE — Progress Notes (Signed)
Patient ID: [name], male   DOB: Oct 02, 1990, 23 y.o.   MRN: [id number] D- Patient was loud intrusive and verbally confronting peers this am.  A- Talked with patient about using his words and being honest.  Patient would not say why he wanted a pencil sharpened but then held it to his arm like he was going to stab himself with it.  Stopped when firmly asked to A- complemented patient on not lying about why he wanted a sharp pencil.  Talked with him about the importance of trust.  Gave patient a prn ativan.  R- Patient has ben calmer and has been resting a lot of am.  He is labile, playful at times angry at times and calm at times.  Seen by MD,
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
## 2013-10-12 NOTE — Progress Notes (Signed)
RN 1:1 note 2000  D: Pt standing in room with his sitter. Pt picks up a crayon and attempts to eat it. Teacher, early years/preWriter and sitter verbally encouraged pt to remove the crayon from his mouth. Pt was complaint with the verbal request. Writer thanked pt for doing so. A: 1:1 observation continues for this pt.  R: Pt remains safe at this time.

## 2013-10-12 NOTE — Progress Notes (Signed)
Patient resting in bed with eyes closed. No complaints voiced and no distress observed. Level I obs in place for safety and pt is safe. [name] MarseillesFriedman, [name]

## 2013-10-12 NOTE — Progress Notes (Signed)
Patient observed resting in bed with eyes closed. RR WNL, even and unlabored. No acute distress. Level I obs in place for safety and pt is safe. [name]  
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
## 2013-10-12 NOTE — Progress Notes (Signed)
Pt reported that he was allergic to Geodon during this writer's attempt to give a "Now" dose. Pt grabbed his throat and verbalized that Geodon causes his throat to "close". Writer did not administer this medication. Writer will add this medication to his allergy list.

## 2013-10-12 NOTE — Progress Notes (Signed)
[facility] Group Notes:  (Nursing/MHT/Case Management/Adjunct)  Date:  10/12/2013  Time:  10:51 PM  Type of Therapy:  Group Therapy  Participation Level:  Active  Participation Quality:  Attentive  Affect:  Appropriate  Cognitive:  Alert  Insight:  Good  Engagement in Group:  Engaged  Modes of Intervention:  Discussion  Summary of Progress/Problems: Patient says that his day went well but it has some bad points in the day. Pt says that tomorrow he plans to stay in his room to avoid people and was not going to talk to anyone. The thing the patient says he does to take care of himself when he is well is take showers.   [facility] HospitalJOHNSON,TAWANA 10/12/2013, 10:51 PM
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
## 2013-10-12 NOTE — Progress Notes (Signed)
Patient ID: [name], male   DOB: [birth year], 23 y.o.   MRN: [id number] [facility] MD Progress Note  10/12/2013 11:27 AM [name]  MRN:  [id number] Subjective: Patient reports: "I am still hearing voices but not as bad as when I came into the hospital.''   Objective: Patient was seen and his chart was reviewed. He is reporting ongoing auditory hallucinations-hearing voices telling him to hurt himself but denies visual hallucinations. Patient reports decreased mood lability, racing thoughts but remains impulsive. He has been wandering into other people's room, belligerent and testing limits. Patient has been defiant, argumentative, intrusive and oppositional. He is partially compliant with the unit milieu and medications.  Diagnosis:   DSM5: Schizophrenia Disorders:  Delusional Disorder (297.1) and  Psychotic Disorder (298.8) Obsessive-Compulsive Disorders:   Trauma-Stressor Disorders:   Substance/Addictive Disorders:   Depressive Disorders:  Disruptive Mood Dysregulation Disorder (296.99) Total Time spent with patient: 25 minutes  Axis I: Drug overdose, intentional           Schizoaffective disorder  Axis II: Cluster B Traits Axis III:  Past Medical History  Diagnosis Date  . Asthma   . Overweight    Axis IV: other psychosocial or environmental problems and problems related to social environment  ADL's:  Intact  Sleep: Fair  Appetite:  Fair  Suicidal Ideation: yes Plan:  denies Intent:  denies Means:  denies Homicidal Ideation:   AEB (as evidenced by):  Psychiatric Specialty Exam: Physical Exam  Psychiatric: His mood appears anxious. His affect is labile. His speech is rapid and/or pressured. He is aggressive and actively hallucinating. Thought content is paranoid and delusional. Cognition and memory are normal. He expresses impulsivity. He exhibits a depressed mood. He expresses suicidal ideation.    Review of Systems  Constitutional: Negative.   HENT: Negative.    Eyes: Negative.   Respiratory: Negative.   Cardiovascular: Negative.   Gastrointestinal: Negative.   Genitourinary: Negative.   Musculoskeletal: Negative.   Skin: Negative.   Neurological: Negative.   Endo/Heme/Allergies: Negative.   Psychiatric/Behavioral: Positive for depression, suicidal ideas and hallucinations. The patient is nervous/anxious.     Blood pressure 106/65, pulse 90, temperature 97.6 F (36.4 C), temperature source Oral, resp. rate 18.There is no weight on file to calculate BMI.  General Appearance: Casual  Eye Contact::  Good  Speech:  Pressured  Volume:  Increased  Mood:  Angry and Irritable  Affect:  Labile  Thought Process:  Circumstantial and Disorganized  Orientation:  Full (Time, Place, and Person)  Thought Content:  UTO, refused to discuss  Suicidal Thoughts:  UTO, refused to discuss  Homicidal Thoughts:  UTO, refused to discuss  Memory:  Immediate;   Fair Recent;   Fair Remote;   Fair  Judgement:  Impaired  Insight:  Shallow  Psychomotor Activity:  Increased  Concentration:  Fair  Recall:  [employer] of Knowledge:Fair  Language: Good  Akathisia:  No  Handed:  Right  AIMS (if indicated):     Assets:  Communication Skills Desire for Improvement Physical Health  Sleep:  Number of Hours: 6   Musculoskeletal: Strength & Muscle Tone: within normal limits Gait & Station: normal Patient leans: N/A  Current Medications: Current Facility-Administered Medications  Medication Dose Route Frequency Provider Last Rate Last Dose  . acetaminophen (TYLENOL) tablet 650 mg  650 mg Oral Q4H PRN Nanine Means, NP      . alum & mag hydroxide-simeth (MAALOX/MYLANTA) 200-200-20 MG/5ML suspension 30 mL  30 mL Oral Q4H  PRN Nanine MeansJamison Lord, NP      . benztropine (COGENTIN) tablet 1 mg  1 mg Oral BID Beau FannyJohn C Withrow, FNP   1 mg at 10/12/13 0745  . chlorproMAZINE (THORAZINE) injection 50 mg  50 mg Intramuscular Q8H PRN Beau FannyJohn C Withrow, FNP       And  . LORazepam  (ATIVAN) injection 2 mg  2 mg Intramuscular Q8H PRN Beau FannyJohn C Withrow, FNP       And  . diphenhydrAMINE (BENADRYL) injection 50 mg  50 mg Intramuscular Q8H PRN Beau FannyJohn C Withrow, FNP      . chlorproMAZINE (THORAZINE) tablet 25 mg  25 mg Oral BID Beau FannyJohn C Withrow, FNP   25 mg at 10/12/13 0745  . divalproex (DEPAKOTE ER) 24 hr tablet 500 mg  500 mg Oral BID Blaise Grieshaber      . ibuprofen (ADVIL,MOTRIN) tablet 600 mg  600 mg Oral Q8H PRN Nanine MeansJamison Lord, NP   600 mg at 10/09/13 1848  . LORazepam (ATIVAN) tablet 1 mg  1 mg Oral Q8H PRN Nanine MeansJamison Lord, NP   1 mg at 10/12/13 0748  . magnesium hydroxide (MILK OF MAGNESIA) suspension 30 mL  30 mL Oral Daily PRN Nanine MeansJamison Lord, NP      . OLANZapine zydis (ZYPREXA) disintegrating tablet 10 mg  10 mg Oral Q8H PRN Beau FannyJohn C Withrow, FNP   10 mg at 10/11/13 1422   Or  . OLANZapine (ZYPREXA) injection 10 mg  10 mg Intramuscular Q8H PRN Beau FannyJohn C Withrow, FNP        Lab Results: No results found for this or any previous visit (from the past 48 hour(s)).  Physical Findings: AIMS: Facial and Oral Movements Muscles of Facial Expression: None, normal Lips and Perioral Area: None, normal Jaw: None, normal Tongue: None, normal,Extremity Movements Upper (arms, wrists, hands, fingers): None, normal Lower (legs, knees, ankles, toes): None, normal, Trunk Movements Neck, shoulders, hips: None, normal, Overall Severity Severity of abnormal movements (highest score from questions above): None, normal Incapacitation due to abnormal movements: None, normal Patient's awareness of abnormal movements (rate only patient's report): No Awareness, Dental Status Current problems with teeth and/or dentures?: No Does patient usually wear dentures?: No  CIWA:    COWS:     Treatment Plan Summary: Daily contact with patient to assess and evaluate symptoms and progress in treatment Medication management  Plan: Review of chart, vital signs, medications, and notes.  1-Individual and group  therapy  2-Medication management for depression and anxiety: -Increase Thorazine to 50 bid for psychosis. -Increase Depakote ER to 500mg  bid for mood stabilization. 3-Coping skills for depression, anxiety  4-Continue crisis stabilization and management  5-Address health issues--monitoring vital signs, stable  6-Treatment plan in progress to prevent relapse of depression and anxiety 7-Continue 1:1 observation for safety.    Medical Decision Making Problem Points:  Established problem, worsening (2), Review of last therapy session (1) and Review of psycho-social stressors (1) Data Points:  Order Aims Assessment (2) Review or order clinical lab tests (1) Review of medication regiment & side effects (2)  I certify that inpatient services furnished can reasonably be expected to improve the patient's condition.   Aliciana Ricciardi,MD 10/12/2013, 11:27 AM

## 2013-10-13 ENCOUNTER — Encounter (HOSPITAL_COMMUNITY): Payer: Self-pay | Admitting: Emergency Medicine

## 2013-10-13 ENCOUNTER — Emergency Department (HOSPITAL_COMMUNITY)
Admission: EM | Admit: 2013-10-13 | Discharge: 2013-10-14 | Disposition: A | Discharge status: Home / Self Care | Payer: Medicaid Other | Attending: Emergency Medicine | Admitting: Emergency Medicine

## 2013-10-13 DIAGNOSIS — F259 Schizoaffective disorder, unspecified: Secondary | ICD-10-CM

## 2013-10-13 DIAGNOSIS — R443 Hallucinations, unspecified: Secondary | ICD-10-CM

## 2013-10-13 DIAGNOSIS — IMO0002 Reserved for concepts with insufficient information to code with codable children: Secondary | ICD-10-CM | POA: Insufficient documentation

## 2013-10-13 DIAGNOSIS — J45909 Unspecified asthma, uncomplicated: Secondary | ICD-10-CM | POA: Insufficient documentation

## 2013-10-13 DIAGNOSIS — F200 Paranoid schizophrenia: Secondary | ICD-10-CM | POA: Insufficient documentation

## 2013-10-13 DIAGNOSIS — R451 Restlessness and agitation: Secondary | ICD-10-CM

## 2013-10-13 DIAGNOSIS — Z79899 Other long term (current) drug therapy: Secondary | ICD-10-CM | POA: Insufficient documentation

## 2013-10-13 DIAGNOSIS — Z046 Encounter for general psychiatric examination, requested by authority: Secondary | ICD-10-CM | POA: Insufficient documentation

## 2013-10-13 LAB — ETHANOL: Alcohol, Ethyl (B): <11 mg/dL (ref 0–11)

## 2013-10-13 LAB — COMPREHENSIVE METABOLIC PANEL
ALBUMIN: 3.9 g/dL (ref 3.5–5.2)
ALT: 19 U/L (ref 0–53)
AST: 21 U/L (ref 0–37)
Alkaline Phosphatase: 68 U/L (ref 39–117)
Anion gap: 14 (ref 5–15)
BUN: 11 mg/dL (ref 6–23)
CALCIUM: 9.3 mg/dL (ref 8.4–10.5)
CO2: 24 meq/L (ref 19–32)
Chloride: 101 mEq/L (ref 96–112)
Creatinine, Ser: 0.86 mg/dL (ref 0.50–1.35)
GFR calc Af Amer: >90 mL/min (ref >90)
Glucose, Bld: 95 mg/dL (ref 70–99)
Potassium: 4.1 mEq/L (ref 3.7–5.3)
SODIUM: 139 meq/L (ref 137–147)
Total Bilirubin: <0.2 mg/dL — ABNORMAL LOW (ref 0.3–1.2)
Total Protein: 7.4 g/dL (ref 6.0–8.3)

## 2013-10-13 LAB — CBC WITH DIFFERENTIAL/PLATELET
BASOS ABS: 0 10*3/uL (ref 0.0–0.1)
Basophils Relative: 0 % (ref 0–1)
EOS PCT: 2 % (ref 0–5)
Eosinophils Absolute: 0.1 10*3/uL (ref 0.0–0.7)
HCT: 43.1 % (ref 39.0–52.0)
Hemoglobin: 14.7 g/dL (ref 13.0–17.0)
Lymphocytes Relative: 38 % (ref 12–46)
Lymphs Abs: 2.3 10*3/uL (ref 0.7–4.0)
MCH: 27.5 pg (ref 26.0–34.0)
MCHC: 34.1 g/dL (ref 30.0–36.0)
MCV: 80.6 fL (ref 78.0–100.0)
Monocytes Absolute: 0.5 10*3/uL (ref 0.1–1.0)
Monocytes Relative: 9 % (ref 3–12)
Neutro Abs: 3 10*3/uL (ref 1.7–7.7)
Neutrophils Relative %: 51 % (ref 43–77)
PLATELETS: 336 10*3/uL (ref 150–400)
RBC: 5.35 MIL/uL (ref 4.22–5.81)
RDW: 13 % (ref 11.5–15.5)
WBC: 5.9 10*3/uL (ref 4.0–10.5)

## 2013-10-13 LAB — RAPID URINE DRUG SCREEN, HOSP PERFORMED
Amphetamines: NOT DETECTED
BENZODIAZEPINES: NOT DETECTED
Barbiturates: NOT DETECTED
COCAINE: NOT DETECTED
OPIATES: NOT DETECTED
Tetrahydrocannabinol: NOT DETECTED

## 2013-10-13 LAB — SALICYLATE LEVEL: Salicylate Lvl: <2 mg/dL — ABNORMAL LOW (ref 2.8–20.0)

## 2013-10-13 LAB — ACETAMINOPHEN LEVEL: Acetaminophen (Tylenol), Serum: <15 ug/mL (ref 10–30)

## 2013-10-13 MED ORDER — BENZTROPINE MESYLATE 1 MG PO TABS
1.0000 mg | ORAL_TABLET | Freq: Two times a day (BID) | ORAL | Status: DC
  Administered 2013-10-13 – 2013-10-14 (×2): 1 mg via ORAL
  Filled 2013-10-13 (×2): qty 1

## 2013-10-13 MED ORDER — DIPHENHYDRAMINE HCL 25 MG PO CAPS: 50.0000 mg | ORAL_CAPSULE | Freq: Four times a day (QID) | ORAL | Status: DC | PRN

## 2013-10-13 MED ORDER — LORAZEPAM 1 MG PO TABS: 2.0000 mg | ORAL_TABLET | Freq: Four times a day (QID) | ORAL | Status: DC | PRN

## 2013-10-13 MED ORDER — LORAZEPAM 1 MG PO TABS
1.0000 mg | ORAL_TABLET | Freq: Once | ORAL | Status: AC
  Administered 2013-10-13: 1 mg via ORAL
  Filled 2013-10-13: qty 1

## 2013-10-13 MED ORDER — CHLORPROMAZINE HCL 100 MG PO TABS: 100.0000 mg | ORAL_TABLET | Freq: Two times a day (BID) | ORAL | Status: DC

## 2013-10-13 MED ORDER — NICOTINE 21 MG/24HR TD PT24: 21.0000 mg | MEDICATED_PATCH | Freq: Every day | TRANSDERMAL | Status: DC

## 2013-10-13 MED ORDER — BENZTROPINE MESYLATE 1 MG PO TABS: 1.0000 mg | ORAL_TABLET | Freq: Two times a day (BID) | ORAL | Status: DC

## 2013-10-13 MED ORDER — CHLORPROMAZINE HCL 25 MG/ML IJ SOLN: 100.0000 mg | Freq: Four times a day (QID) | INTRAMUSCULAR | Status: DC | PRN

## 2013-10-13 MED ORDER — ONDANSETRON HCL 4 MG PO TABS: 4.0000 mg | ORAL_TABLET | Freq: Three times a day (TID) | ORAL | Status: DC | PRN

## 2013-10-13 MED ORDER — CHLORPROMAZINE HCL 50 MG PO TABS: 100.0000 mg | ORAL_TABLET | Freq: Four times a day (QID) | ORAL | Status: DC | PRN

## 2013-10-13 MED ORDER — CHLORPROMAZINE HCL 25 MG PO TABS
100.0000 mg | ORAL_TABLET | Freq: Two times a day (BID) | ORAL | Status: DC
  Administered 2013-10-13 – 2013-10-14 (×2): 100 mg via ORAL
  Filled 2013-10-13 (×2): qty 4

## 2013-10-13 MED ORDER — CHLORPROMAZINE HCL 50 MG PO TABS
50.0000 mg | ORAL_TABLET | Freq: Two times a day (BID) | ORAL | Status: DC
  Administered 2013-10-13 (×2): 50 mg via ORAL
  Filled 2013-10-13 (×2): qty 1

## 2013-10-13 MED ORDER — DIVALPROEX SODIUM ER 500 MG PO TB24: 500.0000 mg | ORAL_TABLET | Freq: Two times a day (BID) | ORAL | Status: DC

## 2013-10-13 MED ORDER — ACETAMINOPHEN 325 MG PO TABS: 650.0000 mg | ORAL_TABLET | ORAL | Status: DC | PRN

## 2013-10-13 MED ORDER — DIPHENHYDRAMINE HCL 50 MG/ML IJ SOLN: 50.0000 mg | Freq: Four times a day (QID) | INTRAMUSCULAR | Status: DC | PRN

## 2013-10-13 MED ORDER — TRAZODONE HCL 50 MG PO TABS
50.0000 mg | ORAL_TABLET | Freq: Every evening | ORAL | Status: DC | PRN
  Filled 2013-10-13: qty 14

## 2013-10-13 MED ORDER — CHLORPROMAZINE HCL 100 MG PO TABS
100.0000 mg | ORAL_TABLET | Freq: Two times a day (BID) | ORAL | Status: DC
  Filled 2013-10-13 (×2): qty 1

## 2013-10-13 MED ORDER — TRAZODONE HCL 50 MG PO TABS
50.0000 mg | ORAL_TABLET | Freq: Every evening | ORAL | Status: DC | PRN
  Filled 2013-10-13: qty 1

## 2013-10-13 MED ORDER — TRAZODONE HCL 50 MG PO TABS: 50.0000 mg | ORAL_TABLET | Freq: Every evening | ORAL | Status: DC | PRN

## 2013-10-13 MED ORDER — LORAZEPAM 2 MG/ML IJ SOLN: 2.0000 mg | Freq: Four times a day (QID) | INTRAMUSCULAR | Status: DC | PRN

## 2013-10-13 MED ORDER — DIVALPROEX SODIUM ER 500 MG PO TB24
500.0000 mg | ORAL_TABLET | Freq: Two times a day (BID) | ORAL | Status: DC
  Administered 2013-10-14: 500 mg via ORAL
  Filled 2013-10-13 (×5): qty 1

## 2013-10-13 NOTE — ED Provider Notes (Signed)
CSN: [id number]     Arrival date & time 10/13/13  2054 History   First MD Initiated Contact with Patient 10/13/13 2139     Chief Complaint  Patient presents with  . Medical Clearance    The patient is here because he is suicidal.  He says his friends "[name] and Dorathy DaftKayla" are telling him to take his 74 pills that he has.  He denies homicidal.     (Consider location/radiation/quality/duration/timing/severity/associated sxs/prior Treatment) HPI Comments: Patient is a 23 year old male with a history of bipolar 1 disorder, ODD, ADHD, and schizophrenia who presents to the emergency department for evaluation. Patient states that he was [facility] this [facility]. After arriving home, patient states that he got into a verbal argument with his girlfriend. He states that this emotional stress triggered his auditory and visual hallucinations of his friends, "[name] and DixieKayla". Triage note mentions command hallucinations instructing the patient to overdose. Patient, however, denies any command hallucinations or suicidal ideations. He states that he was not able to take his medication after arriving home. He also states that he had a followup appointment with [facility] today which he missed. Patient denies alcohol and illicit drug use. He denies homicidal ideations. She denies hallucinations at present.  The history is provided by the patient. No language interpreter was used.    Past Medical History  Diagnosis Date  . Asthma   . Bipolar 1 disorder   . ODD (oppositional defiant disorder)   . ADHD (attention deficit hyperactivity disorder)   . Schizophrenia   . Suicidal ideation   . Homicidal ideation   . Explosive personality disorder    History reviewed. No pertinent past surgical history. History reviewed. No pertinent family history. History  Substance Use Topics  . Smoking status: Never Smoker   . Smokeless tobacco: Not on file  . Alcohol Use: No     Comment:  Patient denies    Review of Systems  Psychiatric/Behavioral: Positive for hallucinations, behavioral problems and agitation.  All other systems reviewed and are negative.    Allergies  Carbamazepine; Haldol; Strattera; Other; and Geodon  Home Medications   Prior to Admission medications   Medication Sig Start Date End Date Taking? Authorizing Provider  benztropine (COGENTIN) 1 MG tablet Take 1 tablet (1 mg total) by mouth 2 (two) times daily. 10/13/13  Yes Beau FannyJohn C Withrow, FNP  chlorproMAZINE (THORAZINE) 100 MG tablet Take 1 tablet (100 mg total) by mouth 2 (two) times daily. 10/13/13  Yes Beau FannyJohn C Withrow, FNP  divalproex (DEPAKOTE ER) 500 MG 24 hr tablet Take 1 tablet (500 mg total) by mouth 2 (two) times daily. 10/13/13  Yes Beau FannyJohn C Withrow, FNP  traZODone (DESYREL) 50 MG tablet Take 1 tablet (50 mg total) by mouth at bedtime as needed for sleep. 10/13/13  Yes Beau FannyJohn C Withrow, FNP   BP 94/53  Pulse 67  Temp(Src) 98 F (36.7 C) (Oral)  Resp 20  Wt 275 lb 6.4 oz (124.921 kg)  SpO2 98%  Physical Exam  Nursing note and vitals reviewed. Constitutional: He is oriented to person, place, and time. He appears well-developed and well-nourished. No distress.  HENT:  Head: Normocephalic and atraumatic.  Eyes: Conjunctivae and EOM are normal. No scleral icterus.  Neck: Normal range of motion.  Pulmonary/Chest: Effort normal. No respiratory distress.  Musculoskeletal: Normal range of motion.  Neurological: He is alert and oriented to person, place, and time.  Skin: Skin is warm and dry. No rash noted. He  is not diaphoretic. No erythema. No pallor.  Psychiatric: His affect is angry. His speech is rapid and/or pressured. He is agitated. He is not actively hallucinating and not combative. Thought content is paranoid. He expresses no homicidal and no suicidal ideation. He expresses no suicidal plans and no homicidal plans.    ED Course  Procedures (including critical care time) Labs Review Labs  Reviewed  COMPREHENSIVE METABOLIC PANEL - Abnormal; Notable for the following:    Total Bilirubin <0.2 (*)    All other components within normal limits  SALICYLATE LEVEL - Abnormal; Notable for the following:    Salicylate Lvl <2.0 (*)    All other components within normal limits  CBC WITH DIFFERENTIAL  ACETAMINOPHEN LEVEL  ETHANOL  URINE RAPID DRUG SCREEN (HOSP PERFORMED)    Imaging Review No results found.   EKG Interpretation None      MDM   Final diagnoses:  Hallucinations  Agitation    23 year old male, released from [facility] a few hours prior to arrival, presents to the emergency department for agitation and command hallucinations. Per triage note, patient's hallucinations were telling him to overdose on his medications. Patient denies any suicidal ideations or command hallucinations on my presentation with him today. Patient denies alcohol and illicit drug use. He denies homicidal thoughts.  Patient has been IVCd as he became very agitated and angry following arrival. Patient has since calmed. He is currently pending psychiatric evaluation in the morning. Disposition to be determined by oncoming ED provider. Patient medically cleared.    Antony Madura, [place] 10/14/13 [phone]

## 2013-10-13 NOTE — Progress Notes (Addendum)
Writer received collateral information regarding the patient from Dr. Thurnell LoseYow.  [facility]  Patient got into a fight with his girlfriend and is now stating that he is suicidal.   Tele Psych is scheduled for 23:30.  Writer informed the nurse working with the patient.

## 2013-10-13 NOTE — Progress Notes (Signed)
Patient ID: [name], male   DOB: 07-24-1990, 23 y.o.   MRN: [id number] Patient is discharged ambulatory to ride home with his mother and her husband, patient's stepfather.  Patient denies SI/HI.  He verbalizes understanding of his discharge meds and his followup.  He met with [name] from the [place] Team prior to discharge She received a copy of his AVS.  He was given scripts and a supply of meds.  He has been calm this am. He attended group and was attentive this am.

## 2013-10-13 NOTE — Progress Notes (Deleted)
Pt in hall with a blanket. Pt questions why he can't have a blanket to sleep with. Pt informed that he can't use a blanket at this time due to his recent suicide attempt (choking himself with his jacket).  Pt refused to give staff the blanket. Pt then grabbed the cover and quickly made a knot. Writer, sitter, and hall Tech placed hands in between the pt's neck and the cover to protect his airway. Pt was not able to tighten the cover around his neck. Additional staff and security arrived to assist in the situation. Pt became less resistant as staff were able to remove the cover from around his neck. Pt than sat against the wall to bang his head against the wall. Staff verbally intervened as well as placed our hands against the wall to prevent the pt from continuously banging his head against the wall. Pt stopped and was able to be escorted to his room. Police were contacted during the course of this event. It was reported to this Clinical research associatewriter that this pt was making verbal threats to choke staff. Writer was in the process of receiving orders, when this pt made the threatening statement. Writer went down the hall with an injection of Benadryl and Geodon. Pt was quietly in his room at this time. Police arrived once the situation was deescalated. The linen cart was removed from the hallway to prevent the pt's access to any sheets or covers.    Pt scratched a healing wound with a staple he removed from his journal. Pt refused to give his 1:1 sitter the staple. Writer arrived and demanded this pt to give the staple. Pt reports that he flushed the staple in the commode. Pt persistently verbalized that he no longer had the staple. Police was present on the unit from the recent transport of an IVC pt. Pt discarded the staple in the trash can in front of the police. There are two staples missing from his journal. This pt could possibly have another staple. Sitter made aware.

## 2013-10-13 NOTE — [facility] Suicide Risk Assessment (Addendum)
   Demographic Factors:  Male, Low socioeconomic status and Unemployed  Total Time spent with patient: 20 minutes  Psychiatric Specialty Exam: Physical Exam  Psychiatric: He has a normal mood and affect. His speech is normal and behavior is normal. Judgment and thought content normal. Cognition and memory are normal.    Review of Systems  Constitutional: Negative.   HENT: Negative.   Eyes: Negative.   Respiratory: Negative.   Cardiovascular: Negative.   Gastrointestinal: Negative.   Genitourinary: Negative.   Musculoskeletal: Negative.   Skin: Negative.   Neurological: Negative.   Endo/Heme/Allergies: Negative.   Psychiatric/Behavioral: Negative.     Blood pressure 97/59, pulse 96, temperature 97.8 F (36.6 C), temperature source Oral, resp. rate 18.There is no weight on file to calculate BMI.  General Appearance: Fairly Groomed  Patent attorneyye Contact::  Good  Speech:  Clear and Coherent and Normal Rate  Volume:  Normal  Mood:  Euthymic  Affect:  Appropriate  Thought Process:  Circumstantial  Orientation:  Full (Time, Place, and Person)  Thought Content:  Negative  Suicidal Thoughts:  No  Homicidal Thoughts:  No  Memory:  Immediate;   Fair Recent;   Fair Remote;   Fair  Judgement:  Fair  Insight:  marginal  Psychomotor Activity:  Normal  Concentration:  Fair  Recall:  FiservFair  Fund of Knowledge:Fair  Language: Good  Akathisia:  No  Handed:  Right  AIMS (if indicated):     Assets:  Communication Skills Desire for Improvement Physical Health Social Support  Sleep:  Number of Hours: 6    Musculoskeletal: Strength & Muscle Tone: within normal limits Gait & Station: normal Patient leans: N/A   Mental Status Per Nursing Assessment::   On Admission:  Self-harm thoughts;Self-harm behaviors  Current Mental Status by Physician: patient denies suicidal ideation, intent or plan  Loss Factors: Financial problems/change in socioeconomic status  Historical Factors: poor  impulse control  Risk Reduction Factors:   Sense of responsibility to family, Living with another person, especially a relative and Positive social support  Continued Clinical Symptoms:  Resolving mood symptoms  Cognitive Features That Contribute To Risk:  Closed-mindedness Polarized thinking    Suicide Risk:  Minimal: No identifiable suicidal ideation.  Patients presenting with no risk factors but with morbid ruminations; may be classified as minimal risk based on the severity of the depressive symptoms  Discharge Diagnoses:   AXIS I:  Schizoaffective disorder  AXIS II:  Cluster B Traits AXIS III:   Past Medical History  Diagnosis Date  . Asthma    AXIS IV:  other psychosocial or environmental problems and problems related to social environment AXIS V:  61-70 mild symptoms  Plan Of Care/Follow-up recommendations:  Activity:  as tolerated Diet:  healthy Tests:  Valproic acid level Other:  patient to keep his after care appointment  Is patient on multiple antipsychotic therapies at discharge:  No   Has Patient had three or more failed trials of antipsychotic monotherapy by history:  No  Recommended Plan for Multiple Antipsychotic Therapies: NA    Thedore MinsAkintayo, Cassian Torelli, MD 10/13/2013, 9:58 AM

## 2013-10-13 NOTE — Tx Team (Signed)
  Interdisciplinary Treatment Plan Update   Date Reviewed:  10/13/2013  Time Reviewed:  10:24 AM  Progress in Treatment:   Attending groups: Yes Participating in groups: Yes Taking medication as prescribed: Yes  Tolerating medication: Yes Family/Significant other contact made: Yes  Patient understands diagnosis: Yes  Discussing patient identified problems/goals with staff: Yes Medical problems stabilized or resolved: Yes Denies suicidal/homicidal ideation: Yes Patient has not harmed self or others: Yes  For review of initial/current patient goals, please see plan of care.  Estimated Length of Stay:  D/C today  Reason for Continuation of Hospitalization:   New Problems/Goals identified:  N/A  Discharge Plan or Barriers:   return home, follow up outpt  Additional Comments:  Attendees:  Signature: Thedore MinsMojeed Akintayo, MD 10/13/2013 10:24 AM   Signature: Richelle Itood Josian Lanese, LCSW 10/13/2013 10:24 AM  Signature: Fransisca KaufmannLaura Davis, NP 10/13/2013 10:24 AM  Signature: Joslyn Devonaroline Beaudry, RN 10/13/2013 10:24 AM  Signature: Liborio NixonPatrice White, RN 10/13/2013 10:24 AM  Signature:  10/13/2013 10:24 AM  Signature:   10/13/2013 10:24 AM  Signature:    Signature:    Signature:    Signature:    Signature:    Signature:      Scribe for Treatment Team:   Richelle Itood Isra Lindy, LCSW  10/13/2013 10:24 AM

## 2013-10-13 NOTE — ED Notes (Signed)
Pt states that he was diagnosed with paranoid schizophrenia at age 23. Pt talks to a person named Toula MoosKayleigh, who is not in the room. Pt states that he is hearing and seeing things. Pt is uncooperative and hostile. Male sitter at bedside, as the pt refuses a male Comptrollersitter.

## 2013-10-13 NOTE — ED Notes (Signed)
TT scheduled for 111:30pm per ACT

## 2013-10-13 NOTE — Progress Notes (Signed)
D: Pt positive for SI/AVH. Pt reports command auditory hallucinations to hurt this Clinical research associatewriter. Pt states that he would not harm this Clinical research associatewriter, because I'm his nurse. However, pt is noted to be intrusive. Pt needed persistent redirection for his intrusiveness. Pt is noted to be argumentative, labile, and impulsive.  A: Pt verbally contracts for safety. Continued support and availability as needed was extended to this pt. Staff continue to monitor pt with q5415min checks.  R: No adverse drug reactions noted. Pt receptive to treatment. Pt remains safe at this time.

## 2013-10-13 NOTE — ED Notes (Signed)
The patient is here because he is suicidal.  He says his friends "[name] and Dorathy DaftKayla" are telling him to take his 74 pills that he has.  He denies homicidal.  He says his girlfriend is threatening to leave him and this has caused him some emotional stress.  He is here to be evaluated.

## 2013-10-13 NOTE — Progress Notes (Signed)
D: Pt in bed resting in supine position. Pt's respirations even and unlabored. Pt appears to be in no signs of distress at this time.  A: 1:1 observation continues for this pt.  R: Pt remains safe at this time.

## 2013-10-13 NOTE — Progress Notes (Signed)
[facility] :  Will you be returning to the same living situation after discharge: Yes,  home At discharge, do you have transportation home?:Yes,  ACT team Do you have the ability to pay for your medications:Yes,  MCD  Release of information consent forms completed and in the chart;  Patient's signature needed at discharge.  Patient to Follow up at: Follow-up Information   Follow up with [facility] team. (Will see you the day of d/c)    Contact information:   [address]  [place] [336] [phone]      Patient denies SI/HI:   Yes,  yes    Safety Planning and Suicide Prevention discussed:  Yes,  yes  Ida Rogueorth, Ashika Apuzzo B 10/13/2013, 10:23 AM

## 2013-10-13 NOTE — Progress Notes (Addendum)
          Pt in hall with a blanket. Pt questions why he can't have a blanket to sleep with. Pt informed that he can't use a blanket at this time due to his recent suicide attempt (choking himself with his jacket). Pt refused to give staff the blanket. Pt then grabbed the cover and quickly made a knot. Writer, sitter, and hall Tech placed hands in between the pt's neck and the cover to protect his airway. Pt was not able to tighten the cover around his neck. Additional staff and security arrived to assist in the situation. Pt became less resistant as staff were able to remove the cover from around his neck. Pt than sat against the wall to bang his head against the wall. Staff verbally intervened as well as placed our hands against the wall to prevent the pt from continuously banging his head against the wall. Pt stopped and was able to be escorted to his room. Police were contacted during the course of this event. It was reported to this Clinical research associatewriter that this pt was making verbal threats to choke staff. Writer was in the process of receiving orders, when this pt made the threatening statement. Writer went down the hall with an injection of Benadryl and Geodon. Pt was quietly in his room at this time. Police arrived once the situation was deescalated. The linen cart was removed from the hallway to prevent the pt's access to any sheets or covers.   No break of skin or swelling present. Pt declined any pain medications or cold packs for his 10/10 HA.     Pt scratched a healing wound with a staple he removed from his journal. Pt refused to give his 1:1 sitter the staple. Writer arrived and demanded this pt to give the staple. Pt reports that he flushed the staple in the commode. Pt persistently verbalized that he no longer had the staple. Police was present on the unit from the recent transport of an IVC pt. Pt discarded the staple in the trash can in front of the police. There are two staples missing from his journal.  This pt could possibly have another staple. Sitter made aware.     Pt was bleeding from the scratch. Writer cleaned area with NS. Bandage applied.

## 2013-10-13 NOTE — Progress Notes (Signed)
Rn 1:1 Note  D: Pt in bed resting with eyes closed in supine position. Respirations even and unlabored. Pt appears to be in no signs of distress at this time.  A: 1:1 observation continues for this pt.  R: Pt remains safe at this time.

## 2013-10-13 NOTE — Progress Notes (Addendum)
RN 1:1 Note D: Pt in bed resting with eyes closed. Respirations even and unlabored. Pt appears to be in no signs of distress at this time. A: 1:1 observation continues for this pt. R: Pt remains safe at this time.   

## 2013-10-13 NOTE — Progress Notes (Signed)
The focus of this group is to educate the patient on the purpose and policies of crisis stabilization and provide a format to answer questions about their admission.  The group details unit policies and expectations of patients while admitted. Patient sat quietly in group and appeared to be listening.  He participated a very little in group exercises, saying his was trying to be calm.  He shared that playing video games bring him joy and he said his goal for today is "to stay calm in difficult circumstances"  .

## 2013-10-14 DIAGNOSIS — F909 Attention-deficit hyperactivity disorder, unspecified type: Secondary | ICD-10-CM

## 2013-10-14 DIAGNOSIS — F259 Schizoaffective disorder, unspecified: Secondary | ICD-10-CM

## 2013-10-14 DIAGNOSIS — F913 Oppositional defiant disorder: Secondary | ICD-10-CM

## 2013-10-14 NOTE — Progress Notes (Addendum)
Per Pam patient continues to be on the wait list at [facility]  10/12/2013 - 10/18/2013, authorization #[id number]
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
## 2013-10-14 NOTE — ED Notes (Signed)
Pt IVC has been rescinded.

## 2013-10-14 NOTE — Consult Note (Signed)
[facility] Consult   Reason for Consult:  psychosis Referring Physician:  EDP [name] is an 23 y.o. male. Total Time spent with patient: 45 minutes  Assessment: AXIS I:  ADHD, combined type, Oppositional Defiant Disorder and Schizoaffective Disorder AXIS II:  Deferred AXIS III:   Past Medical History  Diagnosis Date  . Asthma   . Bipolar 1 disorder   . ODD (oppositional defiant disorder)   . ADHD (attention deficit hyperactivity disorder)   . Schizophrenia   . Suicidal ideation   . Homicidal ideation   . Explosive personality disorder    AXIS IV:  other psychosocial or environmental problems, problems related to social environment and problems with primary support group AXIS V:  51-60 moderate symptoms  Plan:  May discharge home with his mother for out patient psych treatment No medication changes during this visit. No evidence of imminent risk to self or others at present.   Patient does not meet criteria for psychiatric inpatient admission. Supportive therapy provided about ongoing stressors. Discussed crisis plan, support from social network, calling 911, coming to the Emergency Department, and calling Suicide Hotline. Refer to out patient psych treatment with [facility] and Follow up with ACT team leader [place]. No medication changes at this visit. Appreciate psychiatric consultation and will sign off at this time Please contact [phone] if needs further assistance  Subjective:   [name] is a 23 y.o. male patient admitted with psychosis and verbal conflicts.  HPI:  Patient is seen for face to face psych consultation for psychosis. Patient is known to the [facility] and to this provider from his recent hospital and ER encounters. Patient is calm, cooperative and pleasant during this visit. Patient stated that he forgot to take his medication last night and has auditory hallucination and his mother has concern about his argument with his girl friend. His GF  lives in [place]. He denied current symptoms of psychosis, A/V hallucinations, suicidal or homicidal ideation, intention and plans. Patient is contracting for safety and willing to follow up with his out patient care as scheduled and willing to take his medications. He is willing to not to get into phone calls and arguments with his GF for sometime or until things will be settle down in between them.   Medical History: 23 year old male with a history of bipolar 1 disorder, ODD, ADHD, and schizophrenia who presents to the emergency department for evaluation. Patient states that he was discharged from behavioral [facility] this afternoon. After arriving home, patient states that he got into a verbal argument with his girlfriend. He states that this emotional stress triggered his auditory and visual hallucinations of his friends, "[name] and [place]". Triage note mentions command hallucinations instructing the patient to overdose. Patient, however, denies any command hallucinations or suicidal ideations. He states that he was not able to take his medication after arriving home. He also states that he had a followup appointment with [facility] today which he missed. Patient denies alcohol and illicit drug use. He denies homicidal ideations. She denies hallucinations at present  HPI Elements:   Location:  psychosis. Quality:  fair. Severity:  moderate. Timing:  verbal conflicts.  Past Psychiatric History: Past Medical History  Diagnosis Date  . Asthma   . Bipolar 1 disorder   . ODD (oppositional defiant disorder)   . ADHD (attention deficit hyperactivity disorder)   . Schizophrenia   . Suicidal ideation   . Homicidal ideation   . Explosive personality disorder  reports that he has never smoked. He does not have any smokeless tobacco history on file. He reports that he does not drink alcohol or use illicit drugs. History reviewed. No pertinent family history. Family History Substance Abuse:  No Family Supports: Yes, List: Living Arrangements: Parent;Other relatives (Mother and Step Father) Can pt return to current living arrangement?: Yes   Allergies:   Allergies  Allergen Reactions  . Carbamazepine Anaphylaxis, Other (See Comments) and Cough    Cough up blood  . Haldol [Haloperidol Lactate] Anaphylaxis and Other (See Comments)    Patient reports that he stopped taking this because of throat swelling.   [name] [Atomoxetine] Anaphylaxis, Other (See Comments) and Cough    Cough up blood  . Other Nausea Only    Apple Juice, stomach hurts and itchy throat   . Geodon [Ziprasidone Hcl] Other (See Comments)    " Causes my throat to close"    ACT Assessment Complete:  Yes:    Educational Status    Risk to Self: Risk to self with the past 6 months Suicidal Ideation: Yes-Currently Present Suicidal Intent: Yes-Currently Present Is patient at risk for suicide?: Yes Suicidal Plan?: Yes-Currently Present Specify Current Suicidal Plan: Drinking bleach Access to Means: Yes Specify Access to Suicidal Means: He has money to buy bleach  (He has access to bleach at his mothers home.) What has been your use of drugs/alcohol within the last 12 months?: None Reported Other Self Harm Risks: Drinking hand sanitizer Triggers for Past Attempts: Hallucinations;Unpredictable Intentional Self Injurious Behavior: None Comment - Self Injurious Behavior: None Reported Family Suicide History: No Recent stressful life event(s):  (None Reported) Persecutory voices/beliefs?: Yes Depression: Yes Depression Symptoms: Loss of interest in usual pleasures;Feeling angry/irritable;Feeling worthless/self pity Substance abuse history and/or treatment for substance abuse?: No Suicide prevention information given to non-admitted patients: Yes  Risk to Others: Risk to Others within the past 6 months Homicidal Ideation: No Thoughts of Harm to Others: No Comment - Thoughts of Harm to Others: None  Reported Current Homicidal Intent: No Current Homicidal Plan: No Access to Homicidal Means: No Describe Access to Homicidal Means:  (NA) Identified Victim: NA History of harm to others?: Yes Assessment of Violence: On admission Violent Behavior Description: Pt was IVCed due to threatening a sitter  Does patient have access to weapons?: No Criminal Charges Pending?: No Does patient have a court date: No  Abuse:    Prior Inpatient Therapy: Prior Inpatient Therapy Prior Inpatient Therapy: Yes Prior Therapy Dates: Multple  Prior Therapy Facilty/Provider(s): [facility] and other facilities  Reason for Treatment: Psychosis   Prior Outpatient Therapy: Prior Outpatient Therapy Prior Outpatient Therapy: Yes Prior Therapy Dates: Current Prior Therapy Facilty/Provider(s): [facility] Reason for Treatment: Med Mgmt and Therapy  Additional Information: Additional Information 1:1 In Past 12 Months?: No CIRT Risk: Yes Elopement Risk: Yes Does patient have medical clearance?: Yes    Objective: Blood pressure 114/57, pulse 93, temperature 97.5 F (36.4 C), temperature source Oral, resp. rate 14, weight 124.921 kg (275 lb 6.4 oz), SpO2 99.00%.Body mass index is 39.52 kg/(m^2). Results for orders placed during the hospital encounter of 10/13/13 (from the past 72 hour(s))  CBC WITH DIFFERENTIAL     Status: None   Collection Time    10/13/13  9:50 PM      Result Value Ref Range   WBC 5.9  4.0 - 10.5 K/uL   RBC 5.35  4.22 - 5.81 MIL/uL   Hemoglobin 14.7  13.0 - 17.0  g/dL   HCT 45.6  13.2 - 73.5 %   MCV 80.6  78.0 - 100.0 fL   MCH 27.5  26.0 - 34.0 pg   MCHC 34.1  30.0 - 36.0 g/dL   RDW 30.2  95.0 - 64.6 %   Platelets 336  150 - 400 K/uL   Neutrophils Relative % 51  43 - 77 %   Neutro Abs 3.0  1.7 - 7.7 K/uL   Lymphocytes Relative 38  12 - 46 %   Lymphs Abs 2.3  0.7 - 4.0 K/uL   Monocytes Relative 9  3 - 12 %   Monocytes Absolute 0.5  0.1 - 1.0 K/uL   Eosinophils Relative 2  0 - 5 %    Eosinophils Absolute 0.1  0.0 - 0.7 K/uL   Basophils Relative 0  0 - 1 %   Basophils Absolute 0.0  0.0 - 0.1 K/uL  COMPREHENSIVE METABOLIC PANEL     Status: Abnormal   Collection Time    10/13/13  9:50 PM      Result Value Ref Range   Sodium 139  137 - 147 mEq/L   Potassium 4.1  3.7 - 5.3 mEq/L   Chloride 101  96 - 112 mEq/L   CO2 24  19 - 32 mEq/L   Glucose, Bld 95  70 - 99 mg/dL   BUN 11  6 - 23 mg/dL   Creatinine, Ser 2.88  0.50 - 1.35 mg/dL   Calcium 9.3  8.4 - 05.5 mg/dL   Total Protein 7.4  6.0 - 8.3 g/dL   Albumin 3.9  3.5 - 5.2 g/dL   AST 21  0 - 37 U/L   ALT 19  0 - 53 U/L   Alkaline Phosphatase 68  39 - 117 U/L   Total Bilirubin <0.2 (*) 0.3 - 1.2 mg/dL   GFR calc non Af Amer >90  >90 mL/min   GFR calc Af Amer >90  >90 mL/min   Comment: (NOTE)     The eGFR has been calculated using the CKD EPI equation.     This calculation has not been validated in all clinical situations.     eGFR's persistently <90 mL/min signify possible Chronic Kidney     Disease.   Anion gap 14  5 - 15  ACETAMINOPHEN LEVEL     Status: None   Collection Time    10/13/13  9:50 PM      Result Value Ref Range   Acetaminophen (Tylenol), Serum <15.0  10 - 30 ug/mL   Comment:            THERAPEUTIC CONCENTRATIONS VARY     SIGNIFICANTLY. A RANGE OF 10-30     ug/mL MAY BE AN EFFECTIVE     CONCENTRATION FOR MANY PATIENTS.     HOWEVER, SOME ARE BEST TREATED     AT CONCENTRATIONS OUTSIDE THIS     RANGE.     ACETAMINOPHEN CONCENTRATIONS     >150 ug/mL AT 4 HOURS AFTER     INGESTION AND >50 ug/mL AT 12     HOURS AFTER INGESTION ARE     OFTEN ASSOCIATED WITH TOXIC     REACTIONS.  SALICYLATE LEVEL     Status: Abnormal   Collection Time    10/13/13  9:50 PM      Result Value Ref Range   Salicylate Lvl <2.0 (*) 2.8 - 20.0 mg/dL  ETHANOL     Status: None   Collection Time  10/13/13  9:50 PM      Result Value Ref Range   Alcohol, Ethyl (B) <11  0 - 11 mg/dL   Comment:            LOWEST  DETECTABLE LIMIT FOR     SERUM ALCOHOL IS 11 mg/dL     FOR MEDICAL PURPOSES ONLY  URINE RAPID DRUG SCREEN (HOSP PERFORMED)     Status: None   Collection Time    10/13/13 10:10 PM      Result Value Ref Range   Opiates NONE DETECTED  NONE DETECTED   Cocaine NONE DETECTED  NONE DETECTED   Benzodiazepines NONE DETECTED  NONE DETECTED   Amphetamines NONE DETECTED  NONE DETECTED   Tetrahydrocannabinol NONE DETECTED  NONE DETECTED   Barbiturates NONE DETECTED  NONE DETECTED   Comment:            DRUG SCREEN FOR MEDICAL PURPOSES     ONLY.  IF CONFIRMATION IS NEEDED     FOR ANY PURPOSE, NOTIFY LAB     WITHIN 5 DAYS.                LOWEST DETECTABLE LIMITS     FOR URINE DRUG SCREEN     Drug Class       Cutoff (ng/mL)     Amphetamine      1000     Barbiturate      200     Benzodiazepine   601     Tricyclics       093     Opiates          300     Cocaine          300     THC              50   Labs are reviewed.  Current Facility-Administered Medications  Medication Dose Route Frequency Provider Last Rate Last Dose  . acetaminophen (TYLENOL) tablet 650 mg  650 mg Oral Q4H PRN Antonietta Breach, PA-C      . benztropine (COGENTIN) tablet 1 mg  1 mg Oral BID Antonietta Breach, PA-C   1 mg at 10/14/13 2355  . chlorproMAZINE (THORAZINE) tablet 100 mg  100 mg Oral BID Antonietta Breach, PA-C   100 mg at 10/14/13 7322  . divalproex (DEPAKOTE ER) 24 hr tablet 500 mg  500 mg Oral BID Antonietta Breach, PA-C   500 mg at 10/14/13 0254  . nicotine (NICODERM CQ - dosed in mg/24 hours) patch 21 mg  21 mg Transdermal Daily Antonietta Breach, PA-C      . ondansetron [facility]) tablet 4 mg  4 mg Oral Q8H PRN Antonietta Breach, PA-C      . traZODone (DESYREL) tablet 50 mg  50 mg Oral QHS PRN Antonietta Breach, PA-C       Current Outpatient Prescriptions  Medication Sig Dispense Refill  . benztropine (COGENTIN) 1 MG tablet Take 1 tablet (1 mg total) by mouth 2 (two) times daily.  60 tablet  0  . chlorproMAZINE (THORAZINE) 100 MG tablet Take 1  tablet (100 mg total) by mouth 2 (two) times daily.  60 tablet  0  . divalproex (DEPAKOTE ER) 500 MG 24 hr tablet Take 1 tablet (500 mg total) by mouth 2 (two) times daily.  60 tablet  0  . traZODone (DESYREL) 50 MG tablet Take 1 tablet (50 mg total) by mouth at bedtime as needed for sleep.  14 tablet  0    Psychiatric Specialty Exam: Physical Exam  ROS  Blood pressure 114/57, pulse 93, temperature 97.5 F (36.4 C), temperature source Oral, resp. rate 14, weight 124.921 kg (275 lb 6.4 oz), SpO2 99.00%.Body mass index is 39.52 kg/(m^2).  General Appearance: Casual  Eye Contact::  Good  Speech:  Clear and Coherent  Volume:  Normal  Mood:  Depressed  Affect:  Appropriate and Congruent  Thought Process:  Coherent and Goal Directed  Orientation:  Full (Time, Place, and Person)  Thought Content:  WDL  Suicidal Thoughts:  No  Homicidal Thoughts:  No  Memory:  Immediate;   Good Recent;   Good  Judgement:  Good  Insight:  Good  Psychomotor Activity:  Normal  Concentration:  Good  Recall:  Good  Fund of Knowledge:Good  Language: Good  Akathisia:  NA  Handed:  Right  AIMS (if indicated):     Assets:  Communication Skills Desire for Improvement Financial Resources/Insurance Housing Intimacy Leisure Time [place] Talents/Skills Transportation  Sleep:      Musculoskeletal: Strength & Muscle Tone: within normal limits Gait & Station: normal Patient leans: N/A  Treatment Plan Summary: Daily contact with patient to assess and evaluate symptoms and progress in treatment Medication management  [name],JANARDHAHA R. 10/14/2013 11:57 AM

## 2013-10-14 NOTE — ED Notes (Signed)
Pt telepsych done by [name]

## 2013-10-14 NOTE — ED Notes (Signed)
IVC PAPERS WERE  SERVED

## 2013-10-14 NOTE — Discharge Instructions (Signed)
Major Depressive Disorder °Major depressive disorder is a mental illness. It also may be called clinical depression or unipolar depression. Major depressive disorder usually causes feelings of sadness, hopelessness, or helplessness. Some people with this disorder do not feel particularly sad but lose interest in doing things they used to enjoy (anhedonia). Major depressive disorder also can cause physical symptoms. It can interfere with work, school, relationships, and other normal everyday activities. The disorder varies in severity but is longer lasting and more serious than the sadness we all feel from time to time in our lives. °Major depressive disorder often is triggered by stressful life events or major life changes. Examples of these triggers include divorce, loss of your job or home, a move, and the death of a family member or close friend. Sometimes this disorder occurs for no obvious reason at all. People who have family members with major depressive disorder or bipolar disorder are at higher risk for developing this disorder, with or without life stressors. Major depressive disorder can occur at any age. It may occur just once in your life (single episode major depressive disorder). It may occur multiple times (recurrent major depressive disorder). °SYMPTOMS °People with major depressive disorder have either anhedonia or depressed mood on nearly a daily basis for at least 2 weeks or longer. Symptoms of depressed mood include: °· Feelings of sadness (blue or down in the dumps) or emptiness. °· Feelings of hopelessness or helplessness. °· Tearfulness or episodes of crying (may be observed by others). °· Irritability (children and adolescents). °In addition to depressed mood or anhedonia or both, people with this disorder have at least four of the following symptoms: °· Difficulty sleeping or sleeping too much.   °· Significant change (increase or decrease) in appetite or weight.   °· Lack of energy or  motivation. °· Feelings of guilt and worthlessness.   °· Difficulty concentrating, remembering, or making decisions. °· Unusually slow movement (psychomotor retardation) or restlessness (as observed by others).   °· Recurrent wishes for death, recurrent thoughts of self-harm (suicide), or a suicide attempt. °People with major depressive disorder commonly have persistent negative thoughts about themselves, other people, and the world. People with severe major depressive disorder may experience distorted beliefs or perceptions about the world (psychotic delusions). They also may see or hear things that are not real (psychotic hallucinations). °DIAGNOSIS °Major depressive disorder is diagnosed through an assessment by your health care provider. Your health care provider will ask about aspects of your daily life, such as mood, sleep, and appetite, to see if you have the diagnostic symptoms of major depressive disorder. Your health care provider may ask about your medical history and use of alcohol or drugs, including prescription medicines. Your health care provider also may do a physical exam and blood work. This is because certain medical conditions and the use of certain substances can cause major depressive disorder-like symptoms (secondary depression). Your health care provider also may refer you to a mental health specialist for further evaluation and treatment. °TREATMENT °It is important to recognize the symptoms of major depressive disorder and seek treatment. The following treatments can be prescribed for this disorder:   °· Medicine. Antidepressant medicines usually are prescribed. Antidepressant medicines are thought to correct chemical imbalances in the brain that are commonly associated with major depressive disorder. Other types of medicine may be added if the symptoms do not respond to antidepressant medicines alone or if psychotic delusions or hallucinations occur. °· Talk therapy. Talk therapy can be  helpful in treating major depressive disorder by providing   support, education, and guidance. Certain types of talk therapy also can help with negative thinking (cognitive behavioral therapy) and with relationship issues that trigger this disorder (interpersonal therapy). °A mental health specialist can help determine which treatment is best for you. Most people with major depressive disorder do well with a combination of medicine and talk therapy. Treatments involving electrical stimulation of the brain can be used in situations with extremely severe symptoms or when medicine and talk therapy do not work over time. These treatments include electroconvulsive therapy, transcranial magnetic stimulation, and vagal nerve stimulation. °Document Released: 06/09/2012 Document Revised: 06/29/2013 Document Reviewed: 06/09/2012 °ExitCare® Patient Information ©2015 [employer]. This information is not intended to replace advice given to you by your health care provider. Make sure you discuss any questions you have with your health care provider. ° °

## 2013-10-14 NOTE — ED Notes (Addendum)
IVC PAPERS APPROVED BY MAGISTRATE OFFICE--AWAITING PAPERS TO BE SERVED
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
## 2013-10-15 ENCOUNTER — Emergency Department (HOSPITAL_COMMUNITY)
Admission: EM | Admit: 2013-10-15 | Discharge: 2013-10-18 | Disposition: A | Discharge status: Other Acute Inpatient Hospital | Payer: Medicaid Other | Attending: Emergency Medicine | Admitting: Emergency Medicine

## 2013-10-15 ENCOUNTER — Encounter (HOSPITAL_COMMUNITY): Payer: Self-pay | Admitting: Emergency Medicine

## 2013-10-15 DIAGNOSIS — F29 Unspecified psychosis not due to a substance or known physiological condition: Secondary | ICD-10-CM | POA: Insufficient documentation

## 2013-10-15 DIAGNOSIS — F209 Schizophrenia, unspecified: Secondary | ICD-10-CM | POA: Diagnosis not present

## 2013-10-15 DIAGNOSIS — F319 Bipolar disorder, unspecified: Secondary | ICD-10-CM | POA: Diagnosis not present

## 2013-10-15 DIAGNOSIS — R443 Hallucinations, unspecified: Secondary | ICD-10-CM | POA: Diagnosis not present

## 2013-10-15 DIAGNOSIS — F251 Schizoaffective disorder, depressive type: Secondary | ICD-10-CM

## 2013-10-15 DIAGNOSIS — R45850 Homicidal ideations: Secondary | ICD-10-CM | POA: Insufficient documentation

## 2013-10-15 DIAGNOSIS — R45851 Suicidal ideations: Secondary | ICD-10-CM | POA: Diagnosis not present

## 2013-10-15 DIAGNOSIS — F259 Schizoaffective disorder, unspecified: Secondary | ICD-10-CM

## 2013-10-15 DIAGNOSIS — R4689 Other symptoms and signs involving appearance and behavior: Secondary | ICD-10-CM

## 2013-10-15 DIAGNOSIS — F23 Brief psychotic disorder: Secondary | ICD-10-CM

## 2013-10-15 LAB — CBC WITH DIFFERENTIAL/PLATELET
BASOS PCT: 0 % (ref 0–1)
Basophils Absolute: 0 10*3/uL (ref 0.0–0.1)
EOS ABS: 0.1 10*3/uL (ref 0.0–0.7)
Eosinophils Relative: 1 % (ref 0–5)
HCT: 43.5 % (ref 39.0–52.0)
HEMOGLOBIN: 15.3 g/dL (ref 13.0–17.0)
Lymphocytes Relative: 33 % (ref 12–46)
Lymphs Abs: 1.9 10*3/uL (ref 0.7–4.0)
MCH: 27.7 pg (ref 26.0–34.0)
MCHC: 35.2 g/dL (ref 30.0–36.0)
MCV: 78.8 fL (ref 78.0–100.0)
MONOS PCT: 8 % (ref 3–12)
Monocytes Absolute: 0.5 10*3/uL (ref 0.1–1.0)
Neutro Abs: 3.3 10*3/uL (ref 1.7–7.7)
Neutrophils Relative %: 58 % (ref 43–77)
Platelets: 348 10*3/uL (ref 150–400)
RBC: 5.52 MIL/uL (ref 4.22–5.81)
RDW: 12.9 % (ref 11.5–15.5)
WBC: 5.8 10*3/uL (ref 4.0–10.5)

## 2013-10-15 MED ORDER — TRAZODONE HCL 50 MG PO TABS: 50.0000 mg | ORAL_TABLET | Freq: Every evening | ORAL | Status: DC | PRN

## 2013-10-15 MED ORDER — NICOTINE 21 MG/24HR TD PT24
21.0000 mg | MEDICATED_PATCH | Freq: Every day | TRANSDERMAL | Status: DC
Start: 2013-10-16 — End: 2013-10-16

## 2013-10-15 MED ORDER — ALBUTEROL SULFATE HFA 108 (90 BASE) MCG/ACT IN AERS
1.0000 | INHALATION_SPRAY | Freq: Four times a day (QID) | RESPIRATORY_TRACT | Status: DC | PRN
  Administered 2013-10-16 – 2013-10-18 (×2): 1 via RESPIRATORY_TRACT
  Filled 2013-10-15 (×2): qty 6.7

## 2013-10-15 MED ORDER — LORAZEPAM 1 MG PO TABS
1.0000 mg | ORAL_TABLET | Freq: Three times a day (TID) | ORAL | Status: DC | PRN
  Administered 2013-10-16 – 2013-10-18 (×5): 1 mg via ORAL
  Filled 2013-10-15 (×5): qty 1

## 2013-10-15 MED ORDER — ACETAMINOPHEN 325 MG PO TABS
650.0000 mg | ORAL_TABLET | ORAL | Status: DC | PRN
  Administered 2013-10-16 – 2013-10-18 (×3): 650 mg via ORAL
  Filled 2013-10-15 (×4): qty 2

## 2013-10-15 MED ORDER — ZOLPIDEM TARTRATE 5 MG PO TABS
5.0000 mg | ORAL_TABLET | Freq: Every evening | ORAL | Status: DC | PRN
  Administered 2013-10-16: 5 mg via ORAL
  Filled 2013-10-15: qty 1

## 2013-10-15 MED ORDER — ONDANSETRON HCL 4 MG PO TABS
4.0000 mg | ORAL_TABLET | Freq: Three times a day (TID) | ORAL | Status: DC | PRN
Start: 2013-10-15 — End: 2013-10-18

## 2013-10-15 MED ORDER — IBUPROFEN 200 MG PO TABS
600.0000 mg | ORAL_TABLET | Freq: Three times a day (TID) | ORAL | Status: DC | PRN
  Administered 2013-10-16 – 2013-10-18 (×4): 600 mg via ORAL
  Filled 2013-10-15 (×4): qty 3

## 2013-10-15 MED ORDER — BENZTROPINE MESYLATE 1 MG PO TABS
1.0000 mg | ORAL_TABLET | Freq: Two times a day (BID) | ORAL | Status: DC
  Administered 2013-10-16 – 2013-10-18 (×6): 1 mg via ORAL
  Filled 2013-10-15 (×6): qty 1

## 2013-10-15 MED ORDER — CHLORPROMAZINE HCL 25 MG PO TABS
100.0000 mg | ORAL_TABLET | Freq: Two times a day (BID) | ORAL | Status: DC
  Administered 2013-10-16 (×2): 100 mg via ORAL
  Filled 2013-10-15 (×2): qty 4

## 2013-10-15 MED ORDER — DIVALPROEX SODIUM ER 500 MG PO TB24
500.0000 mg | ORAL_TABLET | Freq: Two times a day (BID) | ORAL | Status: DC
  Administered 2013-10-16 – 2013-10-18 (×6): 500 mg via ORAL
  Filled 2013-10-15 (×12): qty 1

## 2013-10-15 NOTE — ED Notes (Signed)
Pt to ED with [employer]- pt admits to hearing voices and seeing things that aren't there.  Pt admits to SI, denies HI.  Pt cooperative speaking to "voices" at present.

## 2013-10-15 NOTE — ED Notes (Signed)
[facility] need [facility].

## 2013-10-15 NOTE — ED Notes (Signed)
Pt. Eating hand sanitizer

## 2013-10-15 NOTE — ED Provider Notes (Signed)
CSN: [id number]     Arrival date & time 10/15/13  2131 History   First MD Initiated Contact with Patient 10/15/13 2149     Chief Complaint  Patient presents with  . Suicidal    (Consider location/radiation/quality/duration/timing/severity/associated sxs/prior Treatment) HPI Comments: Patient is a 23 year old male with a history of bipolar disorder, oppositional defiance disorder, ADHD, and schizophrenia who presents to the emergency department for suicidal ideations and visible hallucinations. Patient brought in by [employer]. Patient stating that he has plans to kill himself. Patient has been seen hanging a news in the exam room. When asked if patient has about a hernia but people, he responds that he wants to hurt [place]. Patient pointing to his left forearm and stating that he had a bomb and planted in his left wrist. Patient requesting that I put in a hospital gown as a cape for protection. He endorses medication noncompliance. He denies ETOH and illicit drug use.  The history is provided by the patient. No language interpreter was used.    Past Medical History  Diagnosis Date  . Asthma   . Bipolar 1 disorder   . ODD (oppositional defiant disorder)   . ADHD (attention deficit hyperactivity disorder)   . Schizophrenia   . Suicidal ideation   . Homicidal ideation   . Explosive personality disorder    History reviewed. No pertinent past surgical history. No family history on file. History  Substance Use Topics  . Smoking status: Never Smoker   . Smokeless tobacco: Not on file  . Alcohol Use: No     Comment: Patient denies    Review of Systems  Psychiatric/Behavioral: Positive for suicidal ideas, hallucinations and behavioral problems.  All other systems reviewed and are negative.    Allergies  Carbamazepine; Haldol; Strattera; Other; and Geodon  Home Medications   Prior to Admission medications   Medication Sig Start Date End Date Taking? Authorizing Provider  albuterol  (PROVENTIL HFA;VENTOLIN HFA) 108 (90 BASE) MCG/ACT inhaler Inhale 1 puff into the lungs every 6 (six) hours as needed for wheezing or shortness of breath.   Yes Historical Provider, MD  chlorproMAZINE (THORAZINE) 100 MG tablet Take 1 tablet (100 mg total) by mouth 2 (two) times daily. 10/13/13  Yes Beau FannyJohn C Withrow, FNP  divalproex (DEPAKOTE ER) 500 MG 24 hr tablet Take 1 tablet (500 mg total) by mouth 2 (two) times daily. 10/13/13  Yes Beau FannyJohn C Withrow, FNP  benztropine (COGENTIN) 1 MG tablet Take 1 tablet (1 mg total) by mouth 2 (two) times daily. 10/13/13   Beau FannyJohn C Withrow, FNP  traZODone (DESYREL) 50 MG tablet Take 1 tablet (50 mg total) by mouth at bedtime as needed for sleep. 10/13/13   Everardo AllJohn C Withrow, FNP   BP 146/88  Pulse 84  Temp(Src) 98.4 F (36.9 C) (Oral)  Resp 18  SpO2 100%  Physical Exam  Nursing note and vitals reviewed. Constitutional: He is oriented to person, place, and time. He appears well-developed and well-nourished. No distress.  HENT:  Head: Normocephalic and atraumatic.  Eyes: Conjunctivae and EOM are normal. No scleral icterus.  Neck: Normal range of motion.  Pulmonary/Chest: Effort normal. No respiratory distress.  Musculoskeletal: Normal range of motion.  Neurological: He is alert and oriented to person, place, and time.  Skin: Skin is warm and dry. No rash noted. He is not diaphoretic. No erythema. No pallor.  Abrasions to L volar forearm.  Psychiatric: His speech is normal. His mood appears anxious. His affect is inappropriate.  He is agitated, hyperactive and actively hallucinating. He expresses suicidal ideation. He expresses no homicidal ideation. He expresses no suicidal plans and no homicidal plans.    ED Course  Procedures (including critical care time) Labs Review Labs Reviewed  COMPREHENSIVE METABOLIC PANEL - Abnormal; Notable for the following:    Total Bilirubin 0.2 (*)    All other components within normal limits  SALICYLATE LEVEL - Abnormal; Notable  for the following:    Salicylate Lvl <2.0 (*)    All other components within normal limits  CBC WITH DIFFERENTIAL  ETHANOL  ACETAMINOPHEN LEVEL  URINE RAPID DRUG SCREEN (HOSP PERFORMED)    Imaging Review No results found.   EKG Interpretation None      MDM   Final diagnoses:  Suicidal ideation  Schizoaffective disorder, unspecified type  Acute psychosis    23 year old male well-known to the emergency department with a history of bipolar 1 disorder, oppositional to find disorder, ADHD, and schizophrenia presents to the emergency department for suicidal ideations. Patient hallucinating his friends "[name] and [place]". Patient endorsing thoughts of suicide but will not expand on plan. When asked if patient has homicidal thought he states that he "only wants to kill terrorists". Patient is acting as though he is in [employer] and on an op. Patient seen lying on the floor, pretending to hold a gun. He was also visualized by nursing staff trying to string a noose in the exam room. IVC papers taken out. Patient medically cleared and psychiatric evaluation is pending.   Filed Vitals:   10/15/13 2201 10/16/13 0038  BP: 140/83 146/88  Pulse: 98 84  Temp:  98.4 F (36.9 C)  TempSrc:  Oral  Resp: 18 18  SpO2: 98% 100%      Antony Madura, PA-C 10/16/13 0533

## 2013-10-15 NOTE — ED Notes (Signed)
Hand sanitizer and wipes removed from room, pt unable to get to them. Pt has sitter at bedside. Pt is being polite and following directions.

## 2013-10-16 DIAGNOSIS — R45850 Homicidal ideations: Secondary | ICD-10-CM

## 2013-10-16 DIAGNOSIS — R45851 Suicidal ideations: Secondary | ICD-10-CM

## 2013-10-16 DIAGNOSIS — F259 Schizoaffective disorder, unspecified: Secondary | ICD-10-CM

## 2013-10-16 LAB — ETHANOL

## 2013-10-16 LAB — COMPREHENSIVE METABOLIC PANEL
ALK PHOS: 73 U/L (ref 39–117)
ALT: 24 U/L (ref 0–53)
AST: 21 U/L (ref 0–37)
Albumin: 4.3 g/dL (ref 3.5–5.2)
Anion gap: 13 (ref 5–15)
BUN: 7 mg/dL (ref 6–23)
CO2: 27 mEq/L (ref 19–32)
CREATININE: 0.97 mg/dL (ref 0.50–1.35)
Calcium: 9.8 mg/dL (ref 8.4–10.5)
Chloride: 101 mEq/L (ref 96–112)
GFR calc Af Amer: >90 mL/min (ref >90)
GFR calc non Af Amer: >90 mL/min (ref >90)
Glucose, Bld: 92 mg/dL (ref 70–99)
POTASSIUM: 4.1 meq/L (ref 3.7–5.3)
Sodium: 141 mEq/L (ref 137–147)
TOTAL PROTEIN: 7.9 g/dL (ref 6.0–8.3)
Total Bilirubin: 0.2 mg/dL — ABNORMAL LOW (ref 0.3–1.2)

## 2013-10-16 LAB — RAPID URINE DRUG SCREEN, HOSP PERFORMED
Amphetamines: NOT DETECTED
Barbiturates: NOT DETECTED
Benzodiazepines: NOT DETECTED
Cocaine: NOT DETECTED
OPIATES: NOT DETECTED
Tetrahydrocannabinol: NOT DETECTED

## 2013-10-16 LAB — SALICYLATE LEVEL: Salicylate Lvl: <2 mg/dL — ABNORMAL LOW (ref 2.8–20.0)

## 2013-10-16 LAB — ACETAMINOPHEN LEVEL

## 2013-10-16 MED ORDER — ARIPIPRAZOLE 10 MG PO TABS
10.0000 mg | ORAL_TABLET | Freq: Every day | ORAL | Status: DC
Start: 2013-10-16 — End: 2013-10-18
  Administered 2013-10-16 – 2013-10-17 (×2): 10 mg via ORAL
  Filled 2013-10-16 (×4): qty 1

## 2013-10-16 MED ORDER — OLANZAPINE 10 MG IM SOLR
10.0000 mg | Freq: Once | INTRAMUSCULAR | Status: AC
  Administered 2013-10-16: 10 mg via INTRAMUSCULAR
  Filled 2013-10-16: qty 10

## 2013-10-16 MED ORDER — DIPHENHYDRAMINE HCL 50 MG/ML IJ SOLN
50.0000 mg | Freq: Once | INTRAMUSCULAR | Status: AC
Start: 2013-10-16 — End: 2013-10-16
  Administered 2013-10-16: 50 mg via INTRAMUSCULAR
  Filled 2013-10-16: qty 1

## 2013-10-16 MED ORDER — LORAZEPAM 2 MG/ML IJ SOLN
2.0000 mg | Freq: Once | INTRAMUSCULAR | Status: AC
  Administered 2013-10-16: 2 mg via INTRAMUSCULAR
  Filled 2013-10-16: qty 1

## 2013-10-16 MED ORDER — DIPHENHYDRAMINE HCL 50 MG/ML IJ SOLN
50.0000 mg | Freq: Once | INTRAMUSCULAR | Status: AC
  Administered 2013-10-16: 50 mg via INTRAMUSCULAR
  Filled 2013-10-16: qty 1
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
## 2013-10-16 NOTE — ED Notes (Signed)
Dr jonnalagada has been in to see pt 

## 2013-10-16 NOTE — Progress Notes (Signed)
Patient Discharge Instructions:  After Visit Summary (AVS):   Faxed to:  10/16/13 Psychiatric Admission Assessment Note:   Faxed to:  10/16/13 Suicide Risk Assessment - Discharge Assessment:   Faxed to:  10/16/13 Faxed/Sent to the Next Level Care provider:  10/16/13 [facility] @ [phone]  Jerelene ReddenSheena E Berwick, 10/16/2013, 3:37 PM

## 2013-10-16 NOTE — Progress Notes (Signed)
After the patient signed the "Patient Belong Sheet" with the writers pen. The patient tried to use the pen to scratch his left arm, the writer then took the pen from the patient and left the room.

## 2013-10-16 NOTE — ED Notes (Signed)
Patient found stapler on the floor and began to self injure his left wrist

## 2013-10-16 NOTE — Progress Notes (Signed)
  CARE MANAGEMENT ED NOTE 10/16/2013  Patient:  [name],[name]   Account Number:  [id number]  Date Initiated:  10/16/2013  Documentation initiated by:  Edd ArbourGIBBS,Elishua Radford  Subjective/Objective Assessment:   23 yr old [place] access pt to ED with [employer]- pt admits to hearing voices and seeing things that aren't there.  Pt admits to SI, denies HI.  Pt cooperative speaking to "voices" at present.     Subjective/Objective Assessment Detail:   listed with [facility] is listed as pcp  Pt with 14 ED visits and 2 admissions in last 6 months     Action/Plan:   EPIC updated   Action/Plan Detail:   Anticipated DC Date:  10/16/2013     Status Recommendation to Physician:   Result of Recommendation:    Other ED Services  Consult Working Plan    DC Planning Services  PCP issues  Other  Outpatient Services - Pt will follow up    Choice offered to / List presented to:            Status of service:  Completed, signed off  ED Comments:   ED Comments Detail:

## 2013-10-16 NOTE — ED Notes (Signed)
TTS IN PROGRESS WITH EMILY

## 2013-10-16 NOTE — ED Provider Notes (Signed)
Medical screening examination/treatment/procedure(s) were conducted as a shared visit with non-physician practitioner(s) or resident and myself. I personally evaluated the patient during the encounter and agree with the findings.  I have personally reviewed any xrays and/ or EKG's with the provider and I agree with interpretation.  Patient with history of bipolar, defined disorder, ADHD, schizophrenia, suicidal ideation presents with hearing voices and seeing things that are not there. Patient has had this in the past. Patient said persistent suicidal ideation and homicidal ideation. Patient feels he cannot control his voice is at this time. Patient is not taking medicines regularly and has been on multiple different meds in the past. On exam patient has mild agitation anxiety, patient exhibiting signs of paranoia, neck supple, equal strength bilateral, heart rate normal. Plan for medicines when necessary and TTS evaluation.  Suicidal ideation, schizophrenia, acute psychosis   Enid SkeensJoshua M Chelci [place], MD 10/16/13 [phone]

## 2013-10-16 NOTE — ED Notes (Signed)
Report received from [facility] RN. Pt. Alert and oriented in no distress denies SI, and pain. Pt. States he wants to "hurt the other nurse". Pt. Up pacing, agitated, belligerent, coming to the nurses station talking at staff repeatedly. Will continue to monitor for safety. Pt. Instructed to come to me with problems or concerns. Q 15 minute checks continue.

## 2013-10-16 NOTE — BH Assessment (Signed)
[facility] Assessment Progress Note Pt has an ACTT team with [facility].  LM with June with [facility].

## 2013-10-16 NOTE — Progress Notes (Signed)
CSW attempted to secure placement at the following facilities:  [facility]-no high acuity patient's but faxed information [facility]- Debbie- faxed information [facility]- left message [facility]- [place]- faxed information [facility]- Calvin- faxed information [facility]- Jessica- no beds [facility]- Larisa- no beds [facility]- Karman- no beds [facility]- faxed information [facility]- Ashley- faxed information [name]- Lisa- faxed information [facility]- faxed information    Tully Burgo, MSW, LCSWA, 10/16/2013 Evening Clinical Social Worker [phone]  
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
## 2013-10-16 NOTE — ED Notes (Signed)
Patient presents agitated and uncooperative; states that he is suicidal with a plan to hang himself or take a bunch of pills, states that he sees people all the time [facility]); states that he is also having homicidal thoughts but does not name anyone in particular. NAD

## 2013-10-16 NOTE — ED Provider Notes (Signed)
10:21 AM  Spoke with Dr. Carmelina DaneJonnalagada would [facility] to be evaluated again tomorrow morning. He states the patient is here frequently with SI and hallucinations after he has arguments with his girlfriend. Spoke with Donnita FallsFaith Marie, [facility]. [facility] when there is a psychiatric bed available.  D/w Dr. Rubin PayorPickering [facility].  Layla MawKristen N Catherina Pates, DO 10/16/13 [phone]

## 2013-10-16 NOTE — BH Assessment (Addendum)
[facility] Assessment Progress Note  Spoke with [name], [facility], and he said that pt refuses to work with men, so he will not be coming out to see pt, but that Darrick PennaZola, the team lead will be coming out to see pt.

## 2013-10-17 ENCOUNTER — Encounter (HOSPITAL_COMMUNITY): Payer: Self-pay | Admitting: Psychiatry

## 2013-10-17 MED ORDER — LORAZEPAM 2 MG/ML IJ SOLN
2.0000 mg | Freq: Once | INTRAMUSCULAR | Status: AC
  Administered 2013-10-17: 2 mg via INTRAMUSCULAR
  Filled 2013-10-17: qty 1

## 2013-10-17 MED ORDER — CHLORPROMAZINE HCL 25 MG PO TABS
25.0000 mg | ORAL_TABLET | Freq: Once | ORAL | Status: AC
  Administered 2013-10-17: 25 mg via ORAL
  Filled 2013-10-17: qty 1

## 2013-10-17 MED ORDER — CHLORPROMAZINE HCL 25 MG/ML IJ SOLN
25.0000 mg | Freq: Once | INTRAMUSCULAR | Status: AC
  Administered 2013-10-17: 25 mg via INTRAMUSCULAR
  Filled 2013-10-17: qty 1

## 2013-10-17 MED ORDER — OLANZAPINE 10 MG IM SOLR
10.0000 mg | Freq: Once | INTRAMUSCULAR | Status: AC
  Administered 2013-10-17: 10 mg via INTRAMUSCULAR
  Filled 2013-10-17: qty 10

## 2013-10-17 MED ORDER — QUETIAPINE FUMARATE 100 MG PO TABS
100.0000 mg | ORAL_TABLET | Freq: Three times a day (TID) | ORAL | Status: DC
  Administered 2013-10-17 – 2013-10-18 (×3): 100 mg via ORAL
  Filled 2013-10-17 (×4): qty 1

## 2013-10-17 MED ORDER — DIPHENHYDRAMINE HCL 50 MG/ML IJ SOLN
50.0000 mg | Freq: Once | INTRAMUSCULAR | Status: AC
  Administered 2013-10-17: 50 mg via INTRAMUSCULAR
  Filled 2013-10-17: qty 1

## 2013-10-17 MED ORDER — QUETIAPINE FUMARATE ER 200 MG PO TB24
200.0000 mg | ORAL_TABLET | Freq: Every day | ORAL | Status: DC
Start: 2013-10-17 — End: 2013-10-18
  Filled 2013-10-17 (×2): qty 1

## 2013-10-17 NOTE — ED Notes (Signed)
Patient remains agitated and attention seeking.  He refuses seroquel that was ordered this morning.

## 2013-10-17 NOTE — ED Notes (Signed)
Patient continues to stay at nurse's station asking questions.  He threatened to tie blanket around his neck.  Pulled bed out into hallway and took matrass off.  Bed was taken out of hallway and patient currently does not have one.  Patient has to be continuously redirected.

## 2013-10-17 NOTE — ED Notes (Signed)
1:1/sitter discontinued.  Patient medicated with 10 mg zyprexa and 2 mg ativan IM.  Patient was cooperative.

## 2013-10-17 NOTE — ED Notes (Signed)
Patient resting quietly at this time.

## 2013-10-17 NOTE — ED Notes (Signed)
Pt. C/o Toothache and anxiety related to manic patients.

## 2013-10-17 NOTE — ED Notes (Signed)
Laying in bed, pt reports he is "still feeling suicidal...wrap this blanket around my neck..." Blanket removed from room.

## 2013-10-17 NOTE — ED Notes (Signed)
Patient up at the nurse's station requesting to see the SW.
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
## 2013-10-17 NOTE — ED Notes (Signed)
1:1 observation continued due impulsive and aggressive behavior.  Patient asleep at this time.

## 2013-10-17 NOTE — BH Assessment (Addendum)
Per Binnie RailJoann Glover, [facility], adult unit is currently at capacity. Contacted the following facilities for placement:  INFORMATION RECEIVED, AWAITING RESPONSE: [facility], [facility], [facility], per [facility]  AT CAPACITY: [facility], [facility] per [facility], per [facility], per [facility], per [facility], per [facility], per [facility], per [facility], per [facility], per [facility], per [facility], per Darl PikesSusan  NO RESPONSE: [facility]   [address] Ellis Kynsleigh Westendorf Jr, WisconsinLPC, [facility] Triage Specialist [phone]

## 2013-10-17 NOTE — ED Notes (Addendum)
Pt up to the desk and reports that he did not say he was going to wrap the blanket around his neck "I said something around my neck, not blanket.."  Pt then went back to his room and took the mattress off bed and pushed it and the chair down the hall.

## 2013-10-17 NOTE — ED Notes (Signed)
Patient given 25 mg thorazine and 50 mg benedryl IM.  Law Warehouse manager outside of room.  Patient cooperative.

## 2013-10-17 NOTE — Progress Notes (Signed)
1236- spoke with Junious Dresseronnie at [facility] to follow-up on referral, states referral form was faxed but no referral along with it.  Demographics were given at the time of conversation and referral has been faxed for review for possible admit to their facility.  Will follow-up to confirm receipt.    Tomi BambergerMariya Joy Reiger Disposition MHT

## 2013-10-17 NOTE — Progress Notes (Signed)
Received phone call back from Madill at [facility] requesting copy of IVC paper work, updated vitals and Depakote level.  Depakote level has been ordered but not yet drawn, will send once complete.  Will fax other information requested.  Tomi Bamberger Disposition MHT

## 2013-10-17 NOTE — ED Notes (Signed)
Report received from [facility] Pt. Sleeping, respirations even and unlabored.

## 2013-10-17 NOTE — ED Notes (Signed)
Patient punching wall in front of nurses station demanding to see social work. Explained to patient social work is busy with other patients and will come when free.

## 2013-10-17 NOTE — ED Notes (Signed)
Patient took 100 mg seroquel and 25 thorazine orally.  He then proceeded to BR and appeared to be throwing it up.  Thorazine IM for patient.

## 2013-10-18 ENCOUNTER — Emergency Department (EMERGENCY_DEPARTMENT_HOSPITAL)
Admission: EM | Admit: 2013-10-18 | Discharge: 2013-10-19 | Disposition: A | Discharge status: Home / Self Care | Payer: Medicaid Other | Source: Home / Self Care | Attending: Emergency Medicine | Admitting: Emergency Medicine

## 2013-10-18 ENCOUNTER — Encounter (HOSPITAL_COMMUNITY): Payer: Self-pay | Admitting: Emergency Medicine

## 2013-10-18 ENCOUNTER — Emergency Department (HOSPITAL_COMMUNITY)
Admission: EM | Admit: 2013-10-18 | Discharge: 2013-10-18 | Disposition: A | Discharge status: Home / Self Care | Payer: Medicaid Other

## 2013-10-18 ENCOUNTER — Encounter (HOSPITAL_COMMUNITY): Payer: Self-pay | Admitting: Registered Nurse

## 2013-10-18 DIAGNOSIS — Z79899 Other long term (current) drug therapy: Secondary | ICD-10-CM

## 2013-10-18 DIAGNOSIS — F319 Bipolar disorder, unspecified: Secondary | ICD-10-CM | POA: Insufficient documentation

## 2013-10-18 DIAGNOSIS — J45909 Unspecified asthma, uncomplicated: Secondary | ICD-10-CM | POA: Insufficient documentation

## 2013-10-18 DIAGNOSIS — R45851 Suicidal ideations: Secondary | ICD-10-CM

## 2013-10-18 DIAGNOSIS — F909 Attention-deficit hyperactivity disorder, unspecified type: Secondary | ICD-10-CM

## 2013-10-18 DIAGNOSIS — F209 Schizophrenia, unspecified: Secondary | ICD-10-CM | POA: Insufficient documentation

## 2013-10-18 DIAGNOSIS — Z008 Encounter for other general examination: Secondary | ICD-10-CM

## 2013-10-18 DIAGNOSIS — R000 Tachycardia, unspecified: Secondary | ICD-10-CM | POA: Insufficient documentation

## 2013-10-18 DIAGNOSIS — F259 Schizoaffective disorder, unspecified: Secondary | ICD-10-CM

## 2013-10-18 LAB — COMPREHENSIVE METABOLIC PANEL
ALK PHOS: 72 U/L (ref 39–117)
ALT: 20 U/L (ref 0–53)
AST: 24 U/L (ref 0–37)
Albumin: 4.2 g/dL (ref 3.5–5.2)
Anion gap: 13 (ref 5–15)
BUN: 8 mg/dL (ref 6–23)
CO2: 28 meq/L (ref 19–32)
Calcium: 9.9 mg/dL (ref 8.4–10.5)
Chloride: 100 mEq/L (ref 96–112)
Creatinine, Ser: 0.99 mg/dL (ref 0.50–1.35)
Glucose, Bld: 95 mg/dL (ref 70–99)
POTASSIUM: 4.2 meq/L (ref 3.7–5.3)
SODIUM: 141 meq/L (ref 137–147)
TOTAL PROTEIN: 8 g/dL (ref 6.0–8.3)
Total Bilirubin: 0.3 mg/dL (ref 0.3–1.2)

## 2013-10-18 LAB — CBC WITH DIFFERENTIAL/PLATELET
Basophils Absolute: 0 10*3/uL (ref 0.0–0.1)
Basophils Relative: 0 % (ref 0–1)
Eosinophils Absolute: 0.1 10*3/uL (ref 0.0–0.7)
Eosinophils Relative: 2 % (ref 0–5)
HCT: 43.3 % (ref 39.0–52.0)
Hemoglobin: 14.7 g/dL (ref 13.0–17.0)
LYMPHS ABS: 2.2 10*3/uL (ref 0.7–4.0)
LYMPHS PCT: 41 % (ref 12–46)
MCH: 26.7 pg (ref 26.0–34.0)
MCHC: 33.9 g/dL (ref 30.0–36.0)
MCV: 78.7 fL (ref 78.0–100.0)
Monocytes Absolute: 0.5 10*3/uL (ref 0.1–1.0)
Monocytes Relative: 10 % (ref 3–12)
NEUTROS ABS: 2.6 10*3/uL (ref 1.7–7.7)
NEUTROS PCT: 47 % (ref 43–77)
PLATELETS: 289 10*3/uL (ref 150–400)
RBC: 5.5 MIL/uL (ref 4.22–5.81)
RDW: 12.9 % (ref 11.5–15.5)
WBC: 5.4 10*3/uL (ref 4.0–10.5)

## 2013-10-18 LAB — VALPROIC ACID LEVEL: Valproic Acid Lvl: 81.8 ug/mL (ref 50.0–100.0)

## 2013-10-18 LAB — RAPID URINE DRUG SCREEN, HOSP PERFORMED
Amphetamines: NOT DETECTED
Barbiturates: NOT DETECTED
Benzodiazepines: NOT DETECTED
Cocaine: NOT DETECTED
Opiates: NOT DETECTED
Tetrahydrocannabinol: NOT DETECTED

## 2013-10-18 LAB — ACETAMINOPHEN LEVEL

## 2013-10-18 LAB — SALICYLATE LEVEL: Salicylate Lvl: <2 mg/dL — ABNORMAL LOW (ref 2.8–20.0)

## 2013-10-18 LAB — ETHANOL: Alcohol, Ethyl (B): <11 mg/dL (ref 0–11)

## 2013-10-18 MED ORDER — LORAZEPAM 2 MG/ML IJ SOLN
2.0000 mg | Freq: Once | INTRAMUSCULAR | Status: AC
  Administered 2013-10-18: 2 mg via INTRAMUSCULAR
  Filled 2013-10-18: qty 1

## 2013-10-18 MED ORDER — ARIPIPRAZOLE 10 MG PO TABS: 10.0000 mg | ORAL_TABLET | Freq: Every day | ORAL | Status: DC

## 2013-10-18 MED ORDER — LORAZEPAM 1 MG PO TABS
1.0000 mg | ORAL_TABLET | Freq: Three times a day (TID) | ORAL | Status: DC | PRN
  Administered 2013-10-19: 1 mg via ORAL
  Filled 2013-10-18: qty 1

## 2013-10-18 MED ORDER — OLANZAPINE 10 MG IM SOLR
10.0000 mg | Freq: Once | INTRAMUSCULAR | Status: AC
  Administered 2013-10-19: 10 mg via INTRAMUSCULAR
  Filled 2013-10-18: qty 10

## 2013-10-18 MED ORDER — ACETAMINOPHEN 325 MG PO TABS
650.0000 mg | ORAL_TABLET | ORAL | Status: DC | PRN
  Administered 2013-10-19: 650 mg via ORAL
  Filled 2013-10-18: qty 2

## 2013-10-18 MED ORDER — DIPHENHYDRAMINE HCL 50 MG/ML IJ SOLN
50.0000 mg | Freq: Once | INTRAMUSCULAR | Status: AC
  Administered 2013-10-18: 50 mg via INTRAMUSCULAR
  Filled 2013-10-18: qty 1

## 2013-10-18 MED ORDER — LORAZEPAM 2 MG/ML IJ SOLN: 2.0000 mg | INTRAMUSCULAR | Status: DC | PRN

## 2013-10-18 MED ORDER — IBUPROFEN 200 MG PO TABS: 600.0000 mg | ORAL_TABLET | Freq: Three times a day (TID) | ORAL | Status: DC | PRN

## 2013-10-18 NOTE — ED Notes (Signed)
Bed: WA17 Expected date:  Expected time:  Means of arrival:  Comments: EMS 

## 2013-10-18 NOTE — ED Provider Notes (Signed)
CSN: [id number]     Arrival date & time 10/18/13  2119 History   First MD Initiated Contact with Patient 10/18/13 2129     Chief Complaint  Patient presents with  . Medical Clearance    SI  . Panic Attack  . Hallucinations     (Consider location/radiation/quality/duration/timing/severity/associated sxs/prior Treatment) The history is provided by the patient.  [name] is a 23 y.o. male hx of bipolar, ADHD, schizophrenia, here with agitation, possible intoxication. He has been in and out of the hospital frequently over the last few days. He AMA this morning from [facility]. He left and now say that he is hearing voices that tells him to kill himself. He also said that he took a handful of pills. He said that he may have taken 6 thorazine tablets but the nurse at [facility] states that he didn't leave with any meds. He is agitated and refused exam.    Past Medical History  Diagnosis Date  . Asthma   . Bipolar 1 disorder   . ODD (oppositional defiant disorder)   . ADHD (attention deficit hyperactivity disorder)   . Schizophrenia   . Suicidal ideation   . Homicidal ideation   . Explosive personality disorder    History reviewed. No pertinent past surgical history. History reviewed. No pertinent family history. History  Substance Use Topics  . Smoking status: Never Smoker   . Smokeless tobacco: Not on file  . Alcohol Use: No     Comment: Patient denies    Review of Systems  Psychiatric/Behavioral: Positive for hallucinations and self-injury.  All other systems reviewed and are negative.     Allergies  Carbamazepine; Haldol; Strattera; Other; and Geodon  Home Medications   Prior to Admission medications   Medication Sig Start Date End Date Taking? Authorizing Provider  albuterol (PROVENTIL HFA;VENTOLIN HFA) 108 (90 BASE) MCG/ACT inhaler Inhale 1 puff into the lungs every 6 (six) hours as needed for wheezing or shortness of breath.    Historical Provider, MD   ARIPiprazole (ABILIFY) 10 MG tablet Take 1 tablet (10 mg total) by mouth daily. 10/18/13   Shuvon Rankin, NP  benztropine (COGENTIN) 1 MG tablet Take 1 tablet (1 mg total) by mouth 2 (two) times daily. 10/13/13   Beau Fanny, FNP  chlorproMAZINE (THORAZINE) 100 MG tablet Take 1 tablet (100 mg total) by mouth 2 (two) times daily. 10/13/13   Beau Fanny, FNP  divalproex (DEPAKOTE ER) 500 MG 24 hr tablet Take 1 tablet (500 mg total) by mouth 2 (two) times daily. 10/13/13   Beau Fanny, FNP  traZODone (DESYREL) 50 MG tablet Take 1 tablet (50 mg total) by mouth at bedtime as needed for sleep. 10/13/13   Everardo All Withrow, FNP   BP 120/66  Pulse 114  Temp(Src) 98.8 F (37.1 C) (Oral)  Resp 18  SpO2 97% Physical Exam  Nursing note and vitals reviewed. Constitutional: He is oriented to person, place, and time.  Agitated, refusing exam   HENT:  Head: Normocephalic.  Eyes: Pupils are equal, round, and reactive to light.  Neck: Normal range of motion.  Cardiovascular:  Tachycardic   Pulmonary/Chest: Effort normal.  Abdominal: Soft. Bowel sounds are normal.  Musculoskeletal: Normal range of motion.  Neurological: He is alert and oriented to person, place, and time.  Skin: Skin is warm and dry.  Psychiatric:  Agitated.     ED Course  Procedures (including critical care time) Labs Review Labs Reviewed  SALICYLATE LEVEL -  Abnormal; Notable for the following:    Salicylate Lvl <2.0 (*)    All other components within normal limits  CBC WITH DIFFERENTIAL  COMPREHENSIVE METABOLIC PANEL  ETHANOL  ACETAMINOPHEN LEVEL  URINE RAPID DRUG SCREEN (HOSP PERFORMED)    Imaging Review No results found.   EKG Interpretation   Date/Time:  Sunday October 18 2013 21:23:23 EDT Ventricular Rate:  111 PR Interval:  145 QRS Duration: 91 QT Interval:  310 QTC Calculation: 421 R Axis:   60 Text Interpretation:  Sinus tachycardia Since last tracing rate faster  Confirmed by Aleyda Gindlesperger  MD, Dontell Mian ([id number])  on 10/18/2013 9:35:54 PM      MDM   Final diagnoses:  None   [name] is a 22 y.o. male here with suicidal ideation, possible overdose. Slightly tachy but he is agitated. I filled out IVC paperwork. I gave him ativan, benadryl for sedation. I don't think he actually took thorazine and has no other anticholinergic syndrome. Tox and labs unremarkable. Will move him to psych ED. I signed out to Dr. Ranae Palms to reassess him in several hours for signs of anticholinergic syndrome. Will consult TTS again.      Richardean Canal, MD 10/18/13 [phone]

## 2013-10-18 NOTE — ED Notes (Addendum)
Pt has 3 shirts, gray jogging paints, boxers and black shorts, black tennis shoes, black coat socks sun glasses and watch all placed in 2 personal belongings bag.all placed in locker #29

## 2013-10-18 NOTE — BH Assessment (Signed)
Per Binnie Rail, [facility] at [facility], adult unit is currently at capacity. Contacted the following facilities for placement:   BED AVAILABLE, FAXED CLINICAL INFORMATION: [employer], per [facility], per [address]  AT CAPACITY:  [facility], per [facility], per Ren[facility] Shin, per [facility], per [facility], per [facility], per [facility], per [facility] per [facility], per [facility], per [facility], per [facility], per [facility], per Baruch [facility], per [facility], per Claudine   NO RESPONSE:  [facility]  Pt's information was sent on 10/17/13 to [facility] staff has requested a Depakote level. At this time no Depakote level is available.   Harlin Rain Ria Comment, [facility] Triage Specialist [phone]

## 2013-10-18 NOTE — Discharge Instructions (Signed)
Depression Depression is feeling sad, low, down in the dumps, blue, gloomy, or empty. In general, there are two kinds of depression:  Normal sadness or grief. This can happen after something upsetting. It often goes away on its own within 2 weeks. After losing a loved one (bereavement), normal sadness and grief may last longer than two weeks. It usually gets better with time.  Clinical depression. This kind lasts longer than normal sadness or grief. It keeps you from doing the things you normally do in life. It is often hard to function at home, work, or at school. It may affect your relationships with others. Treatment is often needed. GET HELP RIGHT AWAY IF:  You have thoughts about hurting yourself or others.  You lose touch with reality (psychotic symptoms). You may:  See or hear things that are not real.  Have untrue beliefs about your life or people around you.  Your medicine is giving you problems. MAKE SURE YOU:  Understand these instructions.  Will watch your condition.  Will get help right away if you are not doing well or get worse. Document Released: 03/17/2010 Document Revised: 06/29/2013 Document Reviewed: 06/14/2011 [facility] Patient Information 2015 [place], [place]. This information is not intended to replace advice given to you by your health care provider. Make sure you discuss any questions you have with your health care provider.  Depression Depression is feeling sad, low, down in the dumps, blue, gloomy, or empty. In general, there are two kinds of depression:  Normal sadness or grief. This can happen after something upsetting. It often goes away on its own within 2 weeks. After losing a loved one (bereavement), normal sadness and grief may last longer than two weeks. It usually gets better with time.  Clinical depression. This kind lasts longer than normal sadness or grief. It keeps you from doing the things you normally do in life. It is often hard to function at  home, work, or at school. It may affect your relationships with others. Treatment is often needed. GET HELP RIGHT AWAY IF:  You have thoughts about hurting yourself or others.  You lose touch with reality (psychotic symptoms). You may:  See or hear things that are not real.  Have untrue beliefs about your life or people around you.  Your medicine is giving you problems. MAKE SURE YOU:  Understand these instructions.  Will watch your condition.  Will get help right away if you are not doing well or get worse. Document Released: 03/17/2010 Document Revised: 06/29/2013 Document Reviewed: 06/14/2011 [facility] Patient Information 2015 [place], [place]. This information is not intended to replace advice given to you by your health care provider. Make sure you discuss any questions you have with your health care provider.  Aggression Physically aggressive behavior is common among small children. When frustrated or angry, toddlers may act out. Often, they will push, bite, or hit. Most children show less physical aggression as they grow up. Their language and interpersonal skills improve, too. But continued aggressive behavior is a sign of a problem. This behavior can lead to aggression and delinquency in adolescence and adulthood. Aggressive behavior can be psychological or physical. Forms of psychological aggression include threatening or bullying others. Forms of physical aggression include:  Pushing.  Hitting.  Slapping.  Kicking.  Stabbing.  Shooting.  Raping. PREVENTION  Encouraging the following behaviors can help manage aggression:  Respecting others and valuing differences.  Participating in school and community functions, including sports, music, after-school programs, community groups, and  volunteer work.  Talking with an adult when they are sad, depressed, fearful, anxious, or angry. Discussions with a parent or other family member, Veterinary surgeon, Runner, broadcasting/film/video, or coach can  help.  Avoiding alcohol and drug use.  Dealing with disagreements without aggression, such as conflict resolution. To learn this, children need parents and caregivers to model respectful communication and problem solving.  Limiting exposure to aggression and violence, such as video games that are not age appropriate, violence in the media, or domestic violence. Document Released: 12/10/2006 Document Revised: 05/07/2011 Document Reviewed: 04/20/2010 [facility] Patient Information 2015 [place], [place]. This information is not intended to replace advice given to you by your health care provider. Make sure you discuss any questions you have with your health care provider.  Psychosis Psychosis refers to a severe lack of understanding with reality. During a psychotic episode, you are not able to think clearly. During a psychotic episode, your responses and emotions are inappropriate and do not coincide with what is actually happening. You often have false beliefs about what is happening or who you are (delusions), and you may see, hear, taste, smell, or feel things that are not present (hallucinations). Psychosis is usually a severe symptom of a very serious mental health (psychiatric) condition, but it can sometimes be the result of a medical condition. CAUSES   Psychiatric conditions, such as:  Schizophrenia.  Bipolar disorder.  Depression.  Personality disorders.  Alcohol or drug abuse.  Medical conditions, such as:  Brain injury.  Brain tumor.  Dementia.  Brain diseases, such as Alzheimer's, Parkinson's, or Huntington's disease.  Neurological diseases, such as epilepsy.  Genetic disorders.  Metabolic disorders.  Infections that affect the brain.  Certain prescription drugs.  Stroke. SYMPTOMS   Unable to think or speak clearly or respond appropriately.  Disorganized thinking (thoughts jump from one thought to another).  Severe inappropriate behavior.  Delusions may  include:  A strong belief that is odd, unrealistic, or false.  Feeling extremely fearful or suspicious (paranoid).  Believing you are someone else, have high importance, or have an altered identity.  Hallucinations. DIAGNOSIS   Mental health evaluation.  Physical exam.  Blood tests.  Computerized magnetic scan (MRI) or other brain scans. TREATMENT  Your caregiver will recommend a course of treatment that depends on the cause of the psychosis. Treatment may include:  Monitoring and supportive care in the hospital.  Taking medicines (antipsychotic medicine) to reduce symptoms and balance chemicals in the brain.  Taking medicines to manage underlying mental health conditions.  Therapy and other supportive programs outside of the hospital.  Treating an underlying medical condition. If the cause of the psychosis can be treated or corrected, the outlook is good. Without treatment, psychotic episodes can cause danger to yourself or others. Treatment may be short-term or lifelong. HOME CARE INSTRUCTIONS   Take all medicines as directed. This is important.  Use a pillbox or write down your medicine schedule to make sure you are taking them.  Check with your caregiver before using over-the-counter medicines, herbs, or supplements.  Seek individual and family support through therapy and mental health education (psychoeducation) programs. These will help you manage symptoms and side effects of medicines, learn life skills, and maintain a healthy routine.  Maintain a healthy lifestyle.  Exercise regularly.  Avoid alcohol and drugs.  Learn ways to reduce stress and cope with stress, such as yoga and meditation.  Talk about your feelings with family members or caregivers.  Make time for yourself to do things  you enjoy.  Know the early warning signs of psychosis. Your caregiver will recommend steps to take when you notice symptoms such as:  Feeling anxious or  preoccupied.  Having racing thoughts.  Changes in your interest in life and relationships.  Follow up with your caregivers for continued outpatient treatment as directed. SEEK MEDICAL CARE IF:   Medicines do not seem to be helping.  You hear voices telling you to do things.  You see, smell, or feel things that are not there.  You feel hopeless and overwhelmed.  You feel extremely fearful and suspicious that something will harm you.  You feel like you cannot leave your house.  You have trouble taking care of yourself.  You experience side effects of medicines, such as changes in sleep patterns, dizziness, weight gain, restlessness, movement changes, muscle spasms, or tremors. SEEK IMMEDIATE MEDICAL CARE IF:  Severe psychotic symptoms present a safety issue (such as an urge to hurt yourself or others). MAKE SURE YOU:   Understand these instructions.  Will watch your condition.  Will get help right away if you are not doing well or get worse. FOR MORE INFORMATION  National Institute of Mental Health: http://www.maynard.net/ Document Released: 08/02/2009 Document Revised: 05/07/2011 Document Reviewed: 08/02/2009 [facility] Patient Information 2015 [place], [place]. This information is not intended to replace advice given to you by your health care provider. Make sure you discuss any questions you have with your health care provider.  Schizoaffective Disorder Schizoaffective disorder (ScAD) is a mental illness. It causes symptoms that are a mixture of schizophrenia (a psychotic disorder) and an affective (mood) disorder. The schizophrenic symptoms may include delusions, hallucinations, or odd behavior. The mood symptoms may be similar to major depression or bipolar disorder. ScAD may interfere with personal relationships or normal daily activities. People with ScAD are at increased risk for job loss, social isolation,physical health problems, anxiety and substance use disorders, and  suicide. ScAD usually occurs in cycles. Periods of severe symptoms are followed by periods of less severe symptoms or improvement. The illness affects men and women equally but usually appears at an earlier age (teenage or early adult years) in men. People who have family members with schizophrenia, bipolar disorder, or ScAD are at higher risk of developing ScAD. SYMPTOMS  At any one time, people with ScAD may have psychotic symptoms only or both psychotic and mood symptoms. The psychotic symptoms include one or more of the following:  Hearing, seeing, or feeling things that are not there (hallucinations).   Having fixed, false beliefs (delusions). The delusions usually are of being attacked, harassed, cheated, persecuted, or conspired against (paranoid delusions).  Speaking in a way that makes no sense to others (disorganized speech). The psychotic symptoms of ScAD may also include confusing or odd behavior or any of the negative symptoms of schizophrenia. These include loss of motivation for normal daily activities, such as bathing or grooming, withdrawal from other people, and lack of emotions.  The mood symptoms of ScAD occur more often than not. They resemble major depressive disorder or bipolar mania. Symptoms of major depression include depressed mood and four or more of the following:  Loss of interest in usually pleasurable activities (anhedonia).  Sleeping more or less than normal.  Feeling worthless or excessively guilty.  Lack of energy or motivation.  Trouble concentrating.  Eating more or less than usual.  Thinking a lot about death or suicide. Symptoms of bipolar mania include abnormally elevated or irritable mood and increased energy or activity,  plus three or more of the following:   More confidence than normal or feeling that you are able to do anything (grandiosity).  Feeling rested with less sleep than normal.   Being easily distracted.   Talking more than  usual or feeling pressured to keep talking.   Feeling that your thoughts are racing.  Engaging in high-risk activities such as buying sprees or foolish business decisions. DIAGNOSIS  ScAD is diagnosed through an assessment by your health care provider. Your health care provider will observe and ask questions about your thoughts, behavior, mood, and ability to function in daily life. Your health care provider may also ask questions about your medical history and use of drugs, including prescription medicines. Your health care provider may also order blood tests and imaging exams. Certain medical conditions and substances can cause symptoms that resemble ScAD. Your health care provider may refer you to a mental health specialist for evaluation.  ScAD is divided into two types. The depressive type is diagnosed if your mood symptoms are limited to major depression. The bipolar type is diagnosed if your mood symptoms are manic or a mixture of manic and depressive symptoms TREATMENT  ScAD is usually a lifelong illness. Long-term treatment is necessary. The following treatments are available:  Medicine. Different types of medicine are used to treat ScAD. The exact combination depends on the type and severity of your symptoms. Antipsychotic medicine is used to control psychotic symptomssuch as delusions, paranoia, and hallucinations. Mood stabilizers can even the highs and lows of bipolar manic mood swings. Antidepressant medicines are used to treat major depressive symptoms.  Counseling or talk therapy. Individual, group, or family counseling may be helpful in providing education, support, and guidance. Many people with ScAD also benefit from social skills and job skills (vocational) training. A combination of medicine and counseling is usually best for managing the disorder over time. A procedure in which electricity is applied to the brain through the scalp (electroconvulsive therapy) may be used to treat  people with severe manic symptoms that do not respond to medicine and counseling. HOME CARE INSTRUCTIONS   Take all your medicine as prescribed.  Check with your health care provider before starting new prescription or over-the-counter medicines.  Keep all follow up appointments with your health care provider. SEEK MEDICAL CARE IF:   If you are not able to take your medicines as prescribed.  If your symptoms get worse. SEEK IMMEDIATE MEDICAL CARE IF:   You have serious thoughts about hurting yourself or others. Document Released: 06/25/2006 Document Revised: 06/29/2013 Document Reviewed: 09/26/2012 [facility] Patient Information 2015 [place], [place]. This information is not intended to replace advice given to you by your health care provider. Make sure you discuss any questions you have with your health care provider.  Schizoaffective Disorder Schizoaffective disorder (ScAD) is a mental illness. It causes symptoms that are a mixture of schizophrenia (a psychotic disorder) and an affective (mood) disorder. The schizophrenic symptoms may include delusions, hallucinations, or odd behavior. The mood symptoms may be similar to major depression or bipolar disorder. ScAD may interfere with personal relationships or normal daily activities. People with ScAD are at increased risk for job loss, social isolation,physical health problems, anxiety and substance use disorders, and suicide. ScAD usually occurs in cycles. Periods of severe symptoms are followed by periods of less severe symptoms or improvement. The illness affects men and women equally but usually appears at an earlier age (teenage or early adult years) in men. People who have family  members with schizophrenia, bipolar disorder, or ScAD are at higher risk of developing ScAD. SYMPTOMS  At any one time, people with ScAD may have psychotic symptoms only or both psychotic and mood symptoms. The psychotic symptoms include one or more of the  following:  Hearing, seeing, or feeling things that are not there (hallucinations).   Having fixed, false beliefs (delusions). The delusions usually are of being attacked, harassed, cheated, persecuted, or conspired against (paranoid delusions).  Speaking in a way that makes no sense to others (disorganized speech). The psychotic symptoms of ScAD may also include confusing or odd behavior or any of the negative symptoms of schizophrenia. These include loss of motivation for normal daily activities, such as bathing or grooming, withdrawal from other people, and lack of emotions.  The mood symptoms of ScAD occur more often than not. They resemble major depressive disorder or bipolar mania. Symptoms of major depression include depressed mood and four or more of the following:  Loss of interest in usually pleasurable activities (anhedonia).  Sleeping more or less than normal.  Feeling worthless or excessively guilty.  Lack of energy or motivation.  Trouble concentrating.  Eating more or less than usual.  Thinking a lot about death or suicide. Symptoms of bipolar mania include abnormally elevated or irritable mood and increased energy or activity, plus three or more of the following:   More confidence than normal or feeling that you are able to do anything (grandiosity).  Feeling rested with less sleep than normal.   Being easily distracted.   Talking more than usual or feeling pressured to keep talking.   Feeling that your thoughts are racing.  Engaging in high-risk activities such as buying sprees or foolish business decisions. DIAGNOSIS  ScAD is diagnosed through an assessment by your health care provider. Your health care provider will observe and ask questions about your thoughts, behavior, mood, and ability to function in daily life. Your health care provider may also ask questions about your medical history and use of drugs, including prescription medicines. Your health  care provider may also order blood tests and imaging exams. Certain medical conditions and substances can cause symptoms that resemble ScAD. Your health care provider may refer you to a mental health specialist for evaluation.  ScAD is divided into two types. The depressive type is diagnosed if your mood symptoms are limited to major depression. The bipolar type is diagnosed if your mood symptoms are manic or a mixture of manic and depressive symptoms TREATMENT  ScAD is usually a lifelong illness. Long-term treatment is necessary. The following treatments are available:  Medicine. Different types of medicine are used to treat ScAD. The exact combination depends on the type and severity of your symptoms. Antipsychotic medicine is used to control psychotic symptomssuch as delusions, paranoia, and hallucinations. Mood stabilizers can even the highs and lows of bipolar manic mood swings. Antidepressant medicines are used to treat major depressive symptoms.  Counseling or talk therapy. Individual, group, or family counseling may be helpful in providing education, support, and guidance. Many people with ScAD also benefit from social skills and job skills (vocational) training. A combination of medicine and counseling is usually best for managing the disorder over time. A procedure in which electricity is applied to the brain through the scalp (electroconvulsive therapy) may be used to treat people with severe manic symptoms that do not respond to medicine and counseling. HOME CARE INSTRUCTIONS   Take all your medicine as prescribed.  Check with your health care  provider before starting new prescription or over-the-counter medicines.  Keep all follow up appointments with your health care provider. SEEK MEDICAL CARE IF:   If you are not able to take your medicines as prescribed.  If your symptoms get worse. SEEK IMMEDIATE MEDICAL CARE IF:   You have serious thoughts about hurting yourself or  others. Document Released: 06/25/2006 Document Revised: 06/29/2013 Document Reviewed: 09/26/2012 [facility] Patient Information 2015 [place], [place]. This information is not intended to replace advice given to you by your health care provider. Make sure you discuss any questions you have with your health care provider.

## 2013-10-18 NOTE — ED Notes (Signed)
Pt does not want male sitter at bedside-- requesting to leave. Explained that dr was on way to see him, and that the sitter wasn't changing. Pt requesting to leave, clothing given, pt getting dressed, states will sign out AMA and go to [facility] ED. [employer] notified, Dr notified.

## 2013-10-18 NOTE — ED Notes (Signed)
Per EMS pt first c/o panic attack, SI, abdominal pain and hallucinations.  When pt was taken to triage by EMS he then said he had overdosed on a handful of pills.  Pt will not divulge what kind of pills ingested.  Pt is A&O x4.

## 2013-10-18 NOTE — ED Notes (Signed)
Pt. Up to RR.

## 2013-10-18 NOTE — ED Notes (Signed)
[facility] in route to pick up patient per [name], TTS.

## 2013-10-18 NOTE — [facility] Suicide Risk Assessment (Signed)
Suicide Risk Assessment  Discharge Assessment     Demographic Factors:  Male  Total Time spent with patient: 30 minutes  Psychiatric Specialty Exam:     Blood pressure 116/93, pulse 75, temperature 97.2 F (36.2 C), temperature source Oral, resp. rate 18, SpO2 100.00%.There is no weight on file to calculate BMI.   General Appearance: Casual   Eye Contact:: Good   Speech: Normal Rate   Volume: Normal   Mood: Anxious   Affect: Congruent   Thought Process: Coherent   Orientation: Full (Time, Place, and Person)   Thought Content: Rumination   Suicidal Thoughts: No   Homicidal Thoughts: No   Memory: Immediate; Good  Recent; Good  Remote; Good   Judgement: Fair   Insight: Fair   Psychomotor Activity: Increased   Concentration: Fair   Recall: [employer] of Knowledge:Fair   Language: Fair   Akathisia: No   Handed: Right   AIMS (if indicated):   Assets: Merchant navy officer  Housing  Leisure Time  Physical Health  Resilience  Social Support   Sleep:   Musculoskeletal:  Strength & Muscle Tone: within normal limits  Gait & Station: normal  Patient leans: N/A   Mental Status Per Nursing Assessment::   On Admission:     Current Mental Status by Physician: Patient denies suicidal/homicidal ideation, psychosis, and paranoia at this tiem  Loss Factors: NA  Historical Factors: NA  Risk Reduction Factors:   Positive social support  Continued Clinical Symptoms:  Previous Psychiatric Diagnoses and Treatments  Cognitive Features That Contribute To Risk:  Closed-mindedness    Suicide Risk:  Minimal: No identifiable suicidal ideation.  Patients presenting with no risk factors but with morbid ruminations; may be classified as minimal risk based on the severity of the depressive symptoms  Discharge Diagnoses: AXIS I: Schizoaffective Disorder  AXIS II: Deferred  AXIS III:  Past Medical History   Diagnosis  Date   .  Asthma    .   Bipolar 1 disorder    .  ODD (oppositional defiant disorder)    .  ADHD (attention deficit hyperactivity disorder)    .  Schizophrenia    .  Suicidal ideation    .  Homicidal ideation    .  Explosive personality disorder     AXIS IV: other psychosocial or environmental problems, problems related to social environment and problems with primary support group  AXIS V: 61-70 mild symptoms  Plan Of Care/Follow-up recommendations:  Activity:  as tolerated Diet:  as tolerated  Is patient on multiple antipsychotic therapies at discharge:  Yes,   Do you recommend tapering to monotherapy for antipsychotics?  Continue curren medications and follow up with provider    Has Patient had three or more failed trials of antipsychotic monotherapy by history:  No  Recommended Plan for Multiple Antipsychotic Therapies: NA    Rankin, Shuvon, FNP-BC 10/18/2013, 11:25 AM Pt seen and I agree with assessmet Diannia Ruder MD
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
## 2013-10-18 NOTE — ED Notes (Signed)
Pt was just released from [facility] this afternoon, went home , states he threw his medicine out, and states that he cheeked his meds at [facility]. Also stated that he quit taking his meds so that he can hear the voices to get an opinion on what to do about his girlfriend and his friend. With 3 [employer] officers, but trying to refuse to be wanded by a male security guard, explained that this is the hospital rules, and that he doesn't get to choose his sitters or security.

## 2013-10-18 NOTE — ED Notes (Signed)
Pt to ED with [employer] reports he is hearing voices that are telling him to hurt himself.  Pt uncooperative at present- [employer] at bedside.  Pt changed into paper scrubs- AC notified of need for sitter.

## 2013-10-18 NOTE — Progress Notes (Addendum)
CSW spoke with Erlene Senters with ACTT team [phone] to inform that the patient is discharging today and needing follow up.  Gena reports that she will follow up with the patient on 10/19/2013.    Dr. Tenny Craw rescinded the patient's IVC and faxed to the magistrate office.   CSW spoke with Gena with ACTT team to get contact iformation for family.  She only had iformation for mother [phone] Step father [phone]. No one answered either contact numbers. Gene with ACTT team reports that the patient is his own guardian therefore patient given cab voucher home.    [name], MSW, [place], 10/18/2013 Evening Clinical Social Worker [phone]
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
## 2013-10-19 ENCOUNTER — Emergency Department (HOSPITAL_COMMUNITY)
Admission: EM | Admit: 2013-10-19 | Discharge: 2013-10-20 | Disposition: A | Discharge status: Home / Self Care | Payer: Medicaid Other | Source: Home / Self Care | Attending: Emergency Medicine | Admitting: Emergency Medicine

## 2013-10-19 ENCOUNTER — Encounter (HOSPITAL_COMMUNITY): Payer: Self-pay | Admitting: Emergency Medicine

## 2013-10-19 DIAGNOSIS — Z79899 Other long term (current) drug therapy: Secondary | ICD-10-CM | POA: Insufficient documentation

## 2013-10-19 DIAGNOSIS — J45909 Unspecified asthma, uncomplicated: Secondary | ICD-10-CM | POA: Insufficient documentation

## 2013-10-19 DIAGNOSIS — R443 Hallucinations, unspecified: Secondary | ICD-10-CM | POA: Insufficient documentation

## 2013-10-19 DIAGNOSIS — R45851 Suicidal ideations: Secondary | ICD-10-CM

## 2013-10-19 DIAGNOSIS — R45850 Homicidal ideations: Secondary | ICD-10-CM | POA: Insufficient documentation

## 2013-10-19 DIAGNOSIS — F209 Schizophrenia, unspecified: Secondary | ICD-10-CM | POA: Insufficient documentation

## 2013-10-19 DIAGNOSIS — F259 Schizoaffective disorder, unspecified: Secondary | ICD-10-CM

## 2013-10-19 DIAGNOSIS — F319 Bipolar disorder, unspecified: Secondary | ICD-10-CM | POA: Insufficient documentation

## 2013-10-19 LAB — CBC WITH DIFFERENTIAL/PLATELET
Basophils Absolute: 0 10*3/uL (ref 0.0–0.1)
Basophils Relative: 0 % (ref 0–1)
Eosinophils Absolute: 0.2 10*3/uL (ref 0.0–0.7)
Eosinophils Relative: 3 % (ref 0–5)
HCT: 45.3 % (ref 39.0–52.0)
Hemoglobin: 15.7 g/dL (ref 13.0–17.0)
Lymphocytes Relative: 35 % (ref 12–46)
Lymphs Abs: 1.8 10*3/uL (ref 0.7–4.0)
MCH: 27.4 pg (ref 26.0–34.0)
MCHC: 34.7 g/dL (ref 30.0–36.0)
MCV: 79.1 fL (ref 78.0–100.0)
Monocytes Absolute: 0.5 10*3/uL (ref 0.1–1.0)
Monocytes Relative: 9 % (ref 3–12)
Neutro Abs: 2.7 10*3/uL (ref 1.7–7.7)
Neutrophils Relative %: 53 % (ref 43–77)
Platelets: 293 10*3/uL (ref 150–400)
RBC: 5.73 MIL/uL (ref 4.22–5.81)
RDW: 13.1 % (ref 11.5–15.5)
WBC: 5.2 10*3/uL (ref 4.0–10.5)

## 2013-10-19 LAB — COMPREHENSIVE METABOLIC PANEL
ALT: 20 U/L (ref 0–53)
AST: 28 U/L (ref 0–37)
Albumin: 4.3 g/dL (ref 3.5–5.2)
Alkaline Phosphatase: 78 U/L (ref 39–117)
Anion gap: 12 (ref 5–15)
BUN: 9 mg/dL (ref 6–23)
CO2: 29 mEq/L (ref 19–32)
Calcium: 9.8 mg/dL (ref 8.4–10.5)
Chloride: 99 mEq/L (ref 96–112)
Creatinine, Ser: 0.98 mg/dL (ref 0.50–1.35)
GFR calc Af Amer: >90 mL/min (ref >90)
GFR calc non Af Amer: >90 mL/min (ref >90)
Glucose, Bld: 91 mg/dL (ref 70–99)
Potassium: 4.8 mEq/L (ref 3.7–5.3)
Sodium: 140 mEq/L (ref 137–147)
Total Bilirubin: 0.4 mg/dL (ref 0.3–1.2)
Total Protein: 8.1 g/dL (ref 6.0–8.3)

## 2013-10-19 LAB — ACETAMINOPHEN LEVEL: Acetaminophen (Tylenol), Serum: <15 ug/mL (ref 10–30)

## 2013-10-19 LAB — ETHANOL: Alcohol, Ethyl (B): <11 mg/dL (ref 0–11)

## 2013-10-19 LAB — SALICYLATE LEVEL: Salicylate Lvl: <2 mg/dL — ABNORMAL LOW (ref 2.8–20.0)

## 2013-10-19 MED ORDER — ONDANSETRON HCL 4 MG PO TABS: 4.0000 mg | ORAL_TABLET | Freq: Three times a day (TID) | ORAL | Status: DC | PRN

## 2013-10-19 MED ORDER — DIVALPROEX SODIUM ER 500 MG PO TB24
500.0000 mg | ORAL_TABLET | Freq: Two times a day (BID) | ORAL | Status: DC
  Administered 2013-10-19 – 2013-10-20 (×2): 500 mg via ORAL
  Filled 2013-10-19 (×4): qty 1

## 2013-10-19 MED ORDER — IBUPROFEN 200 MG PO TABS: 600.0000 mg | ORAL_TABLET | Freq: Three times a day (TID) | ORAL | Status: DC | PRN

## 2013-10-19 MED ORDER — ALBUTEROL SULFATE HFA 108 (90 BASE) MCG/ACT IN AERS
1.0000 | INHALATION_SPRAY | Freq: Four times a day (QID) | RESPIRATORY_TRACT | Status: DC | PRN
  Administered 2013-10-20: 1 via RESPIRATORY_TRACT
  Filled 2013-10-19: qty 6.7

## 2013-10-19 MED ORDER — DIPHENHYDRAMINE HCL 25 MG PO CAPS
50.0000 mg | ORAL_CAPSULE | Freq: Four times a day (QID) | ORAL | Status: DC | PRN
  Administered 2013-10-19: 50 mg via ORAL
  Filled 2013-10-19: qty 2

## 2013-10-19 MED ORDER — TRAZODONE HCL 50 MG PO TABS
50.0000 mg | ORAL_TABLET | Freq: Every evening | ORAL | Status: DC | PRN
  Administered 2013-10-19: 50 mg via ORAL
  Filled 2013-10-19: qty 1

## 2013-10-19 MED ORDER — ACETAMINOPHEN 325 MG PO TABS
650.0000 mg | ORAL_TABLET | ORAL | Status: DC | PRN
  Administered 2013-10-20: 650 mg via ORAL
  Filled 2013-10-19: qty 2

## 2013-10-19 MED ORDER — CHLORPROMAZINE HCL 25 MG PO TABS
25.0000 mg | ORAL_TABLET | Freq: Once | ORAL | Status: AC
  Administered 2013-10-19: 25 mg via ORAL
  Filled 2013-10-19: qty 1

## 2013-10-19 MED ORDER — NICOTINE 21 MG/24HR TD PT24
21.0000 mg | MEDICATED_PATCH | Freq: Every day | TRANSDERMAL | Status: DC
  Filled 2013-10-19: qty 1

## 2013-10-19 MED ORDER — ZOLPIDEM TARTRATE 5 MG PO TABS: 5.0000 mg | ORAL_TABLET | Freq: Every evening | ORAL | Status: DC | PRN

## 2013-10-19 MED ORDER — ARIPIPRAZOLE 10 MG PO TABS
10.0000 mg | ORAL_TABLET | Freq: Every day | ORAL | Status: DC
  Filled 2013-10-19: qty 1

## 2013-10-19 MED ORDER — LORAZEPAM 1 MG PO TABS
1.0000 mg | ORAL_TABLET | Freq: Three times a day (TID) | ORAL | Status: DC | PRN
  Administered 2013-10-19: 1 mg via ORAL
  Filled 2013-10-19: qty 1

## 2013-10-19 MED ORDER — BENZTROPINE MESYLATE 1 MG PO TABS
1.0000 mg | ORAL_TABLET | Freq: Two times a day (BID) | ORAL | Status: DC
  Administered 2013-10-19 – 2013-10-20 (×2): 1 mg via ORAL
  Filled 2013-10-19 (×2): qty 1

## 2013-10-19 NOTE — ED Notes (Signed)
AC and charge called about need for sitter.
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
## 2013-10-19 NOTE — ED Notes (Addendum)
Pt ACT team member Darrick Penna in triage with patient

## 2013-10-19 NOTE — ED Notes (Signed)
Report received from [facility]. Pt. Alert and oriented in no distress denies pain. PT. States he is "suicidal" and wants to "hurt someone" Will continue to monitor for safety. Pt. Pacing, coming to the window repeatedly. Pt. Instructed to come to me with problems or concerns. Q 15 minute checks continue.

## 2013-10-19 NOTE — [facility] Suicide Risk Assessment (Signed)
Suicide Risk Assessment  Discharge Assessment     Demographic Factors:  Male  Total Time spent with patient: 20 minutes  Psychiatric Specialty Exam:     Blood pressure 131/71, pulse 85, temperature 97.9 F (36.6 C), temperature source Oral, resp. rate 18, SpO2 100.00%.There is no weight on file to calculate BMI.  General Appearance: Casual  Eye Contact::  Good  Speech:  Normal Rate  Volume:  Normal  Mood:  Irritable  Affect:  Congruent  Thought Process:  Coherent  Orientation:  Full (Time, Place, and Person)  Thought Content:  WDL  Suicidal Thoughts:  No  Homicidal Thoughts:  No  Memory:  Immediate;   Good Recent;   Good Remote;   Good  Judgement:  Fair  Insight:  Fair  Psychomotor Activity:  Normal  Concentration:  Good  Recall:  Good  Fund of Knowledge:Fair  Language: Fair  Akathisia:  No  Handed:  Right  AIMS (if indicated):     Assets:  Communication Skills Desire for Improvement Financial Resources/Insurance Housing Leisure Time Physical Health Resilience Social Support Transportation  Sleep:       Musculoskeletal: Strength & Muscle Tone: within normal limits Gait & Station: normal Patient leans: N/A   Mental Status Per Nursing Assessment::   On Admission:   Suicidal ideations  Current Mental Status by Physician: NA  Loss Factors: NA  Historical Factors: Impulsivity  Risk Reduction Factors:   Sense of responsibility to family, Living with another person, especially a relative, Positive social support and Positive therapeutic relationship  Continued Clinical Symptoms:  None  Cognitive Features That Contribute To Risk:  None   Suicide Risk:  Minimal: No identifiable suicidal ideation.  Patients presenting with no risk factors but with morbid ruminations; may be classified as minimal risk based on the severity of the depressive symptoms  Discharge Diagnoses:   AXIS I:  Schizoaffective Disorder AXIS II:  Deferred AXIS III:   Past  Medical History  Diagnosis Date  . Asthma   . Bipolar 1 disorder   . ODD (oppositional defiant disorder)   . ADHD (attention deficit hyperactivity disorder)   . Schizophrenia   . Suicidal ideation   . Homicidal ideation   . Explosive personality disorder    AXIS IV:  other psychosocial or environmental problems and problems related to social environment AXIS V:  61-70 mild symptoms  Plan Of Care/Follow-up recommendations:  Activity:  as tolerated Diet:  low-sodium heart healthy diet  Is patient on multiple antipsychotic therapies at discharge:  No   Has Patient had three or more failed trials of antipsychotic monotherapy by history:  No  Recommended Plan for Multiple Antipsychotic Therapies: NA    [name], PMH-NP 10/19/2013, 6:00 PM

## 2013-10-19 NOTE — Discharge Instructions (Signed)
Schizoaffective Disorder Schizoaffective disorder (ScAD) is a mental illness. It causes symptoms that are a mixture of schizophrenia (a psychotic disorder) and an affective (mood) disorder. The schizophrenic symptoms may include delusions, hallucinations, or odd behavior. The mood symptoms may be similar to major depression or bipolar disorder. ScAD may interfere with personal relationships or normal daily activities. People with ScAD are at increased risk for job loss, social isolation,physical health problems, anxiety and substance use disorders, and suicide. ScAD usually occurs in cycles. Periods of severe symptoms are followed by periods of less severe symptoms or improvement. The illness affects men and women equally but usually appears at an earlier age (teenage or early adult years) in men. People who have family members with schizophrenia, bipolar disorder, or ScAD are at higher risk of developing ScAD. SYMPTOMS  At any one time, people with ScAD may have psychotic symptoms only or both psychotic and mood symptoms. The psychotic symptoms include one or more of the following:  Hearing, seeing, or feeling things that are not there (hallucinations).   Having fixed, false beliefs (delusions). The delusions usually are of being attacked, harassed, cheated, persecuted, or conspired against (paranoid delusions).  Speaking in a way that makes no sense to others (disorganized speech). The psychotic symptoms of ScAD may also include confusing or odd behavior or any of the negative symptoms of schizophrenia. These include loss of motivation for normal daily activities, such as bathing or grooming, withdrawal from other people, and lack of emotions.  The mood symptoms of ScAD occur more often than not. They resemble major depressive disorder or bipolar mania. Symptoms of major depression include depressed mood and four or more of the following:  Loss of interest in usually pleasurable activities  (anhedonia).  Sleeping more or less than normal.  Feeling worthless or excessively guilty.  Lack of energy or motivation.  Trouble concentrating.  Eating more or less than usual.  Thinking a lot about death or suicide. Symptoms of bipolar mania include abnormally elevated or irritable mood and increased energy or activity, plus three or more of the following:   More confidence than normal or feeling that you are able to do anything (grandiosity).  Feeling rested with less sleep than normal.   Being easily distracted.   Talking more than usual or feeling pressured to keep talking.   Feeling that your thoughts are racing.  Engaging in high-risk activities such as buying sprees or foolish business decisions. DIAGNOSIS  ScAD is diagnosed through an assessment by your health care provider. Your health care provider will observe and ask questions about your thoughts, behavior, mood, and ability to function in daily life. Your health care provider may also ask questions about your medical history and use of drugs, including prescription medicines. Your health care provider may also order blood tests and imaging exams. Certain medical conditions and substances can cause symptoms that resemble ScAD. Your health care provider may refer you to a mental health specialist for evaluation.  ScAD is divided into two types. The depressive type is diagnosed if your mood symptoms are limited to major depression. The bipolar type is diagnosed if your mood symptoms are manic or a mixture of manic and depressive symptoms TREATMENT  ScAD is usually a lifelong illness. Long-term treatment is necessary. The following treatments are available:  Medicine. Different types of medicine are used to treat ScAD. The exact combination depends on the type and severity of your symptoms. Antipsychotic medicine is used to control psychotic symptomssuch as delusions, paranoia,   and hallucinations. Mood stabilizers can  even the highs and lows of bipolar manic mood swings. Antidepressant medicines are used to treat major depressive symptoms.  Counseling or talk therapy. Individual, group, or family counseling may be helpful in providing education, support, and guidance. Many people with ScAD also benefit from social skills and job skills (vocational) training. A combination of medicine and counseling is usually best for managing the disorder over time. A procedure in which electricity is applied to the brain through the scalp (electroconvulsive therapy) may be used to treat people with severe manic symptoms that do not respond to medicine and counseling. HOME CARE INSTRUCTIONS   Take all your medicine as prescribed.  Check with your health care provider before starting new prescription or over-the-counter medicines.  Keep all follow up appointments with your health care provider. SEEK MEDICAL CARE IF:   If you are not able to take your medicines as prescribed.  If your symptoms get worse. SEEK IMMEDIATE MEDICAL CARE IF:   You have serious thoughts about hurting yourself or others. Document Released: 06/25/2006 Document Revised: 06/29/2013 Document Reviewed: 09/26/2012 Exit[employer] Patient Information 2015 [employer]. This information is not intended to replace advice given to you by your health care provider. Make sure you discuss any questions you have with your health care provider.  

## 2013-10-19 NOTE — ED Notes (Signed)
SPOKE WITH PATIENT'S ACT COUNSELOR, ZOLA.  SHE IS ON HER WAY HERE. LIVES IN [place] AND WILL BE HERE SHORTLY.

## 2013-10-19 NOTE — Progress Notes (Signed)
[name] with [name] team arrived to meet face to face with the patient.  She was informed about the ED Care plan and the ACTT team will be called for an assessment every time patient comes into the ED.     [name], MSW, [place], 10/19/2013 Evening Clinical Social Worker [phone]

## 2013-10-19 NOTE — ED Notes (Signed)
Pt reports SI by taking all his pills because he is going to be single in a few weeks. Reports he is hearing voices. Has police with him.

## 2013-10-19 NOTE — ED Provider Notes (Signed)
CSN: [id number]     Arrival date & time 10/19/13  1841 History   First MD Initiated Contact with Patient 10/19/13 2126     Chief Complaint  Patient presents with  . Suicidal     (Consider location/radiation/quality/duration/timing/severity/associated sxs/prior Treatment) HPI Pt is a 23yo male with hx of asthma, bipolar disorded, ODD, ADHD, schizophrenia, SI, HI, and explosive personality disorder presenting to ED with c/o SI and HI over last few days. Pt states he is having problems with his girlfriend who is trying to make him stop talking to a girl he has known almost his whole life. Pt states if his girlfriend breaks up with him, he thinks he will either take bunch of pills or stab himself with a knife and states he has thought of drowning his girlfriend or poisoning her. Pt states he does have access to knives at home which he has cut himself before with.  Pt states he stopped taking his psychiatric medications because they do not work. States he has been having auditory and visual hallucinations for almost 14 years.  Reports drinking alcohol on occasion but believes he will drink more if his girlfriend leaves him. Denies use of cocaine, heroine, or other illicit drugs.  Pt states he was treated at [facility] for psychiatric reasons about 3-4 months ago and would like to go back there because he received good treatment.   Past Medical History  Diagnosis Date  . Asthma   . Bipolar 1 disorder   . ODD (oppositional defiant disorder)   . ADHD (attention deficit hyperactivity disorder)   . Schizophrenia   . Suicidal ideation   . Homicidal ideation   . Explosive personality disorder    History reviewed. No pertinent past surgical history. No family history on file. History  Substance Use Topics  . Smoking status: Never Smoker   . Smokeless tobacco: Not on file  . Alcohol Use: No     Comment: Patient denies    Review of Systems  Constitutional: Negative for fever and chills.  Respiratory:  Negative for cough and shortness of breath.   Cardiovascular: Negative for chest pain and palpitations.  Gastrointestinal: Negative for nausea, vomiting, abdominal pain and diarrhea.  Psychiatric/Behavioral: Positive for suicidal ideas, hallucinations, sleep disturbance and self-injury. Negative for agitation. The patient is nervous/anxious.   All other systems reviewed and are negative.     Allergies  Carbamazepine; Geodon; Haldol; Strattera; and Other  Home Medications   Prior to Admission medications   Medication Sig Start Date End Date Taking? Authorizing Provider  albuterol (PROVENTIL HFA;VENTOLIN HFA) 108 (90 BASE) MCG/ACT inhaler Inhale 1 puff into the lungs every 6 (six) hours as needed for wheezing or shortness of breath.   Yes Historical Provider, MD  ARIPiprazole (ABILIFY) 10 MG tablet Take 1 tablet (10 mg total) by mouth daily. 10/18/13  Yes Shuvon Rankin, NP  benztropine (COGENTIN) 1 MG tablet Take 1 tablet (1 mg total) by mouth 2 (two) times daily. 10/13/13  Yes Beau Fanny, FNP  chlorproMAZINE (THORAZINE) 100 MG tablet Take 1 tablet (100 mg total) by mouth 2 (two) times daily. 10/13/13  Yes Beau Fanny, FNP  divalproex (DEPAKOTE ER) 500 MG 24 hr tablet Take 1 tablet (500 mg total) by mouth 2 (two) times daily. 10/13/13  Yes Beau Fanny, FNP  traZODone (DESYREL) 50 MG tablet Take 1 tablet (50 mg total) by mouth at bedtime as needed for sleep. 10/13/13  Yes Beau Fanny, FNP   BP  122/78  Pulse 109  Temp(Src) 99.4 F (37.4 C) (Oral)  Resp 22  SpO2 99% Physical Exam  Nursing note and vitals reviewed. Constitutional: He appears well-developed and well-nourished.  Pt pacing back and forth in room while talking.   HENT:  Head: Normocephalic and atraumatic.  Eyes: Conjunctivae are normal. No scleral icterus.  Neck: Normal range of motion.  Cardiovascular: Normal rate, regular rhythm and normal heart sounds.   Pulmonary/Chest: Effort normal and breath sounds normal.  No respiratory distress. He has no wheezes. He has no rales. He exhibits no tenderness.  Abdominal: Soft. Bowel sounds are normal. He exhibits no distension and no mass. There is no tenderness. There is no rebound and no guarding.  Musculoskeletal: Normal range of motion.  Neurological: He is alert.  Skin: Skin is warm and dry.  Newly healed superficial laceration to volar aspect of left wrist.   Psychiatric: His speech is normal. His affect is blunt. He is actively hallucinating. Thought content is not delusional. He expresses homicidal and suicidal ideation. He expresses suicidal plans and homicidal plans.    ED Course  Procedures (including critical care time) Labs Review Labs Reviewed  SALICYLATE LEVEL - Abnormal; Notable for the following:    Salicylate Lvl <2.0 (*)    All other components within normal limits  CBC WITH DIFFERENTIAL  COMPREHENSIVE METABOLIC PANEL  ETHANOL  ACETAMINOPHEN LEVEL  URINE RAPID DRUG SCREEN (HOSP PERFORMED)    Imaging Review No results found.   EKG Interpretation None      MDM   Final diagnoses:  Suicidal ideation  Homicidal ideation   Pt presenting to ED with SI and HI with a plan.  Pt is medically cleared pending labs.  Will consult with TTS for further evaluation and disposition. Psych hold orders placed.   Pt signed out to Dr. Norlene Campbell, evaluation by TTS still pending.   Junius Finner, PA-C 10/20/13 [phone]

## 2013-10-19 NOTE — ED Notes (Signed)
PT HAS CARE PLAN, LEFT MESSAGE FOR ZOLA WITH ACT.

## 2013-10-19 NOTE — ED Notes (Signed)
Patient presents with increased attention seeking behavior, verbally aggressive, tearing up things on the unit, pushing alarm buttons, throwing things into the hall, walking into the empty room and throwing mattress on the floor.

## 2013-10-19 NOTE — ED Notes (Signed)
Pt. C/o post nasal drip and headache.

## 2013-10-19 NOTE — ED Notes (Signed)
Pt changed into scrubs.  

## 2013-10-19 NOTE — ED Notes (Signed)
Pt. Discharged to Act Team after instructions.

## 2013-10-19 NOTE — Progress Notes (Signed)
CSW called [facility] ACTT team crisis line and spoke with [name] to inform him of the patient's malingering symptoms then returning the emergency department.  CSW informed [name] that the psychiatrist is want them to come to assess the patient with plans to discharge.  [name] reports that he will take care of this situation and contact his supervisor.    Per [name] Nottingham, NP Dr. Lucianne Muss requested a care plan to upload in the system to address the patient's malingering behaviors and decrease emergency department admissions.     [name], MSW, [place], 10/19/2013 Evening Clinical Social Worker [phone]

## 2013-10-19 NOTE — ED Notes (Signed)
POLICE AT BEDSIDE

## 2013-10-19 NOTE — BH Assessment (Signed)
PLEASE SEE CARE PLAN TO ASSIST IN DIS POSITIONING THIS PATIENT!!!

## 2013-10-20 LAB — RAPID URINE DRUG SCREEN, HOSP PERFORMED
Amphetamines: NOT DETECTED
Barbiturates: NOT DETECTED
Benzodiazepines: NOT DETECTED
Cocaine: NOT DETECTED
Opiates: NOT DETECTED
Tetrahydrocannabinol: NOT DETECTED

## 2013-10-20 NOTE — ED Provider Notes (Signed)
Medical screening examination/treatment/procedure(s) were performed by non-physician practitioner and as supervising physician I was immediately available for consultation/collaboration.   EKG Interpretation None       Glynn Octave, MD 10/20/13 [phone]

## 2013-10-20 NOTE — ED Notes (Signed)
Pt inquiring about leaving AMA---redirected and convinced to stay untill ACTT team rounds on him

## 2013-10-20 NOTE — ED Notes (Signed)
Pt ACT team come to escort him to his home.  Pt D/C by MD

## 2013-10-20 NOTE — ED Notes (Signed)
Pt asked RN to look up phone number to "[facility]" after inquiring about leaving AMA.

## 2013-10-20 NOTE — Consult Note (Signed)
Case discussed, patient well known to the service, does not need inpatient and can be discharged to his ACT Team

## 2013-10-20 NOTE — Discharge Instructions (Signed)
Schizophrenia °Schizophrenia is a mental illness. It may cause disturbed or disorganized thinking, speech, or behavior. People with schizophrenia have problems functioning in one or more areas of life: work, school, home, or relationships. People with schizophrenia are at increased risk for suicide, certain chronic physical illnesses, and unhealthy behaviors, such as smoking and drug use. °People who have family members with schizophrenia are at higher risk of developing the illness. Schizophrenia affects men and women equally but usually appears at an earlier age (teenage or early adult years) in men.  °SYMPTOMS °The earliest symptoms are often subtle (prodrome) and may go unnoticed until the illness becomes more severe (first-break psychosis). Symptoms of schizophrenia may be continuous or may come and go in severity. Episodes often are triggered by major life events, such as family stress, college, military service, marriage, pregnancy or child birth, divorce, or loss of a loved one. People with schizophrenia may see, hear, or feel things that do not exist (hallucinations). They may have false beliefs in spite of obvious proof to the contrary (delusions). Sometimes speech is incoherent or behavior is odd or withdrawn.  °DIAGNOSIS °Schizophrenia is diagnosed through an assessment by your caregiver. Your caregiver will ask questions about your thoughts, behavior, mood, and ability to function in daily life. Your caregiver may ask questions about your medical history and use of alcohol or drugs, including prescription medication. Your caregiver may also order blood tests and imaging exams. Certain medical conditions and substances can cause symptoms that resemble schizophrenia. Your caregiver may refer you to a mental health specialist for evaluation. There are three major criterion for a diagnosis of schizophrenia: °· Two or more of the following five symptoms are present for a month or longer: °¨ Delusions. Often  the delusions are that you are being attacked, harassed, cheated, persecuted or conspired against (persecutory delusions). °¨ Hallucinations.   °¨ Disorganized speech that does not make sense to others. °¨ Grossly disorganized (confused or unfocused) behavior or extremely overactive or underactive motor activity (catatonia). °¨ Negative symptoms such as bland or blunted emotions (flat affect), loss of will power (avolition), and withdrawal from social contacts (social isolation). °· Level of functioning in one or more major areas of life (work, school, relationships, or self-care) is markedly below the level of functioning before the onset of illness.   °· There are continuous signs of illness (either mild symptoms or decreased level of functioning) for at least 6 months or longer. °TREATMENT  °Schizophrenia is a long-term illness. It is best controlled with continuous treatment rather than treatment only when symptoms occur. The following treatments are used to manage schizophrenia: °· Medication--Medication is the most effective and important form of treatment for schizophrenia. Antipsychotic medications are usually prescribed to help manage schizophrenia. Other types of medication may be added to relieve any symptoms that may occur despite the use of antipsychotic medications. °· Counseling or talk therapy--Individual, group, or family counseling may be helpful in providing education, support, and guidance. Many people with schizophrenia also benefit from social skills and job skills (vocational) training. °A combination of medication and counseling is best for managing the disorder over time. A procedure in which electricity is applied to the brain through the scalp (electroconvulsive therapy) may be used to treat catatonic schizophrenia or schizophrenia in people who cannot take or do not respond to medication and counseling. °Document Released: 02/10/2000 Document Revised: 10/15/2012 Document Reviewed:  05/07/2012 °[employer]® Patient Information ©2015 [employer]. This information is not intended to replace advice given to you by   your health care provider. Make sure you discuss any questions you have with your health care provider. ° °

## 2013-10-20 NOTE — ED Notes (Signed)
PATIENT ACT WORKER ZOLA IS COMING TO PICK UP PATIENT AND TAKE HIM TO SEE THE PSYCH DOCTOR, SHE ALSO ADVISES SHE WILL BE TRYING TO FACILITATE PT BECOMNING A "WARD OF THE STATE" SO HIS CARE CAN BE MANAGED FURTHER. DR Gary Fleet AND HAS SEEN PT AND IS AGREEABLE TO RELEASING HIM TO ACT TEAM

## 2013-10-20 NOTE — ED Provider Notes (Signed)
Patient re-assessed by me and patient denies SI/HI. States the voices told him to kill himself if his girlfriend breaks up with him, but he denies wanting to do this. He adamantly states he wouldn't kill himself or hurt anyone else. Requesting discharge. After reviewing his chart and speaking with patient, I do not feel he needs to be involuntarily kept in ED, will d/c. His ACT team member is coming to pick him up.  Audree Camel, MD 10/20/13 [phone]

## 2013-10-21 ENCOUNTER — Encounter (HOSPITAL_COMMUNITY): Payer: Self-pay | Admitting: Emergency Medicine

## 2013-10-21 ENCOUNTER — Emergency Department (HOSPITAL_COMMUNITY)
Admission: EM | Admit: 2013-10-21 | Discharge: 2013-10-21 | Disposition: A | Discharge status: Home / Self Care | Payer: Medicaid Other | Attending: Emergency Medicine | Admitting: Emergency Medicine

## 2013-10-21 DIAGNOSIS — F259 Schizoaffective disorder, unspecified: Secondary | ICD-10-CM | POA: Insufficient documentation

## 2013-10-21 DIAGNOSIS — F319 Bipolar disorder, unspecified: Secondary | ICD-10-CM | POA: Insufficient documentation

## 2013-10-21 DIAGNOSIS — Z79899 Other long term (current) drug therapy: Secondary | ICD-10-CM | POA: Diagnosis not present

## 2013-10-21 DIAGNOSIS — R45851 Suicidal ideations: Secondary | ICD-10-CM | POA: Diagnosis present

## 2013-10-21 DIAGNOSIS — J45909 Unspecified asthma, uncomplicated: Secondary | ICD-10-CM | POA: Diagnosis not present

## 2013-10-21 LAB — RAPID URINE DRUG SCREEN, HOSP PERFORMED
AMPHETAMINES: NOT DETECTED
Barbiturates: NOT DETECTED
Benzodiazepines: NOT DETECTED
Cocaine: NOT DETECTED
Opiates: NOT DETECTED
Tetrahydrocannabinol: NOT DETECTED

## 2013-10-21 LAB — SALICYLATE LEVEL

## 2013-10-21 LAB — BASIC METABOLIC PANEL
ANION GAP: 12 (ref 5–15)
BUN: 11 mg/dL (ref 6–23)
CHLORIDE: 99 meq/L (ref 96–112)
CO2: 27 meq/L (ref 19–32)
CREATININE: 1.18 mg/dL (ref 0.50–1.35)
Calcium: 9.1 mg/dL (ref 8.4–10.5)
GFR calc Af Amer: >90 mL/min (ref >90)
GFR calc non Af Amer: 87 mL/min — ABNORMAL LOW (ref >90)
Glucose, Bld: 87 mg/dL (ref 70–99)
POTASSIUM: 4.2 meq/L (ref 3.7–5.3)
Sodium: 138 mEq/L (ref 137–147)

## 2013-10-21 LAB — ACETAMINOPHEN LEVEL: Acetaminophen (Tylenol), Serum: <15 ug/mL (ref 10–30)

## 2013-10-21 MED ORDER — ONDANSETRON HCL 4 MG PO TABS: 4.0000 mg | ORAL_TABLET | Freq: Three times a day (TID) | ORAL | Status: DC | PRN

## 2013-10-21 MED ORDER — IBUPROFEN 200 MG PO TABS: 600.0000 mg | ORAL_TABLET | Freq: Three times a day (TID) | ORAL | Status: DC | PRN

## 2013-10-21 NOTE — ED Notes (Signed)
Ordered regular diet tray for breakfast.

## 2013-10-21 NOTE — ED Notes (Signed)
Pt c/o SI. Pt has hx of same. Pt has a care plan

## 2013-10-21 NOTE — ED Notes (Signed)
Patient resting quietly.  Sitter present.

## 2013-10-21 NOTE — ED Notes (Signed)
Urine collected and sent. Patient resting quietly.

## 2013-10-21 NOTE — ED Notes (Signed)
[name], sitter, reports that patient is trying to punch himself.

## 2013-10-21 NOTE — ED Notes (Signed)
Patient found pacing halls.  Returned patient to room for assessment.

## 2013-10-21 NOTE — ED Notes (Signed)
Pt requesting to sign out AMA-- Dr. Wilkie Aye notified. Requesting to keep pt until ACTT counselor picks him up.

## 2013-10-21 NOTE — ED Notes (Signed)
Dr. Wilkie Aye is at the bedside.

## 2013-10-21 NOTE — ED Notes (Signed)
Patient is sleeping quietly. Sitter present.

## 2013-10-21 NOTE — ED Provider Notes (Signed)
7:44 AM Care assumed from Dr. Wilkie Aye.  Plan for dc with ACTT when they come to pick up patient.  They are here now.  DC.  Clinical Impression: 1. Schizoaffective disorder, unspecified type   2. Suicidal thoughts       Candyce Churn III, MD 10/21/13 [phone]

## 2013-10-21 NOTE — ED Provider Notes (Signed)
CSN: [id number]     Arrival date & time 10/21/13  0052 History   First MD Initiated Contact with Patient 10/21/13 0122     Chief Complaint  Patient presents with  . Suicidal     (Consider location/radiation/quality/duration/timing/severity/associated sxs/prior Treatment) HPI  This is a 23 year old male well-known to our emergency department who presents with suicidal ideation and hearing voices. Patient was just seen yesterday. He was discharged. He is followed by the ACT team.  Patient states that they gave him medications yesterday and he took them as directed but "I feel worse." He reports that he is hearing voices that are telling him to hurt himself. He denies a plan. He states he does not like the way the medications make him feel. He also reports that there are 2 people behind him the names of "Josh and [name]." He states "can't you see them too?"  He reports chronic left foot pain over the last 2 months.  Past Medical History  Diagnosis Date  . Asthma   . Bipolar 1 disorder   . ODD (oppositional defiant disorder)   . ADHD (attention deficit hyperactivity disorder)   . Schizophrenia   . Suicidal ideation   . Homicidal ideation   . Explosive personality disorder    History reviewed. No pertinent past surgical history. History reviewed. No pertinent family history. History  Substance Use Topics  . Smoking status: Never Smoker   . Smokeless tobacco: Not on file  . Alcohol Use: No     Comment: Patient denies    Review of Systems  Constitutional: Negative.  Negative for fever.  Respiratory: Negative.  Negative for chest tightness and shortness of breath.   Cardiovascular: Negative.  Negative for chest pain.  Gastrointestinal: Negative.  Negative for abdominal pain.  Genitourinary: Negative.  Negative for dysuria.  Neurological: Negative for headaches.  Psychiatric/Behavioral: Positive for suicidal ideas, hallucinations and behavioral problems.  All other systems reviewed  and are negative.     Allergies  Carbamazepine; Geodon; Haldol; Strattera; and Other  Home Medications   Prior to Admission medications   Medication Sig Start Date End Date Taking? Authorizing Provider  albuterol (PROVENTIL HFA;VENTOLIN HFA) 108 (90 BASE) MCG/ACT inhaler Inhale 1 puff into the lungs every 6 (six) hours as needed for wheezing or shortness of breath.    Historical Provider, MD  ARIPiprazole (ABILIFY) 10 MG tablet Take 1 tablet (10 mg total) by mouth daily. 10/18/13   Shuvon Rankin, NP  benztropine (COGENTIN) 1 MG tablet Take 1 tablet (1 mg total) by mouth 2 (two) times daily. 10/13/13   Beau Fanny, FNP  chlorproMAZINE (THORAZINE) 100 MG tablet Take 1 tablet (100 mg total) by mouth 2 (two) times daily. 10/13/13   Beau Fanny, FNP  divalproex (DEPAKOTE ER) 500 MG 24 hr tablet Take 1 tablet (500 mg total) by mouth 2 (two) times daily. 10/13/13   Beau Fanny, FNP  traZODone (DESYREL) 50 MG tablet Take 1 tablet (50 mg total) by mouth at bedtime as needed for sleep. 10/13/13   Beau Fanny, FNP   BP 123/88  Pulse 92  Temp(Src) 98 F (36.7 C) (Oral)  Resp 20  Ht  (1.778 m)  Wt 274 lb (124.286 kg)  BMI 39.32 kg/m2  SpO2 97% Physical Exam  Nursing note and vitals reviewed. Constitutional: He is oriented to person, place, and time.  Agitated  HENT:  Head: Normocephalic and atraumatic.  Eyes: Pupils are equal, round, and reactive to light.  Neck: Neck supple.  Cardiovascular: Normal rate, regular rhythm and normal heart sounds.   No murmur heard. Pulmonary/Chest: Effort normal and breath sounds normal. No respiratory distress. He has no wheezes.  Abdominal: Soft. Bowel sounds are normal. There is no tenderness.  Neurological: He is alert and oriented to person, place, and time.  Skin: Skin is warm and dry.  Psychiatric:  Strange affect, tangential, difficult to direct    ED Course  Procedures (including critical care time) Labs Review Labs Reviewed   URINE RAPID DRUG SCREEN (HOSP PERFORMED)  ACETAMINOPHEN LEVEL  SALICYLATE LEVEL  BASIC METABOLIC PANEL    Imaging Review No results found.   EKG Interpretation None      MDM   Final diagnoses:  None    Patient presents with suicidal ideation and hearing voices. This is his common presentation to the emergency room. He has a care plan in place. At this time he is nontoxic on exam and his vital signs are reassuring. Discussed with Evalee Mutton, ACT Team who knows the patient well. We will hold the patient overnight with a sitter until the ACT team evaluate him tomorrow.    Shon Baton, MD 10/21/13 [phone]

## 2013-10-21 NOTE — ED Notes (Signed)
Spoke with Merlyn Lot ACTT counselor at [phone]. Will come to pick up pt.

## 2013-10-21 NOTE — ED Notes (Signed)
[name], trained sitter is at the bedside.

## 2013-10-21 NOTE — ED Notes (Signed)
Patient reports he is ready to be discharged.  Will follow up with MD.

## 2013-10-21 NOTE — ED Notes (Signed)
Provided patient a coke as requested. Sitter still at the bedside.

## 2013-10-22 ENCOUNTER — Encounter (HOSPITAL_COMMUNITY): Payer: Self-pay | Admitting: Emergency Medicine

## 2013-10-22 ENCOUNTER — Emergency Department (HOSPITAL_COMMUNITY)
Admission: EM | Admit: 2013-10-22 | Discharge: 2013-10-23 | Disposition: A | Discharge status: Home / Self Care | Payer: Medicaid Other | Attending: Emergency Medicine | Admitting: Emergency Medicine

## 2013-10-22 DIAGNOSIS — F209 Schizophrenia, unspecified: Secondary | ICD-10-CM | POA: Insufficient documentation

## 2013-10-22 DIAGNOSIS — F319 Bipolar disorder, unspecified: Secondary | ICD-10-CM | POA: Insufficient documentation

## 2013-10-22 DIAGNOSIS — R45850 Homicidal ideations: Secondary | ICD-10-CM | POA: Diagnosis present

## 2013-10-22 DIAGNOSIS — R45851 Suicidal ideations: Secondary | ICD-10-CM | POA: Diagnosis not present

## 2013-10-22 DIAGNOSIS — J45909 Unspecified asthma, uncomplicated: Secondary | ICD-10-CM | POA: Diagnosis not present

## 2013-10-22 DIAGNOSIS — Z79899 Other long term (current) drug therapy: Secondary | ICD-10-CM | POA: Diagnosis not present

## 2013-10-22 DIAGNOSIS — F259 Schizoaffective disorder, unspecified: Secondary | ICD-10-CM | POA: Insufficient documentation

## 2013-10-22 DIAGNOSIS — F258 Other schizoaffective disorders: Secondary | ICD-10-CM

## 2013-10-22 LAB — COMPREHENSIVE METABOLIC PANEL
ALK PHOS: 77 U/L (ref 39–117)
ALT: 18 U/L (ref 0–53)
AST: 24 U/L (ref 0–37)
Albumin: 4.4 g/dL (ref 3.5–5.2)
Anion gap: 15 (ref 5–15)
BILIRUBIN TOTAL: 0.4 mg/dL (ref 0.3–1.2)
BUN: 7 mg/dL (ref 6–23)
CHLORIDE: 101 meq/L (ref 96–112)
CO2: 25 meq/L (ref 19–32)
Calcium: 9.8 mg/dL (ref 8.4–10.5)
Creatinine, Ser: 0.92 mg/dL (ref 0.50–1.35)
GLUCOSE: 90 mg/dL (ref 70–99)
POTASSIUM: 4.4 meq/L (ref 3.7–5.3)
Sodium: 141 mEq/L (ref 137–147)
Total Protein: 8.4 g/dL — ABNORMAL HIGH (ref 6.0–8.3)

## 2013-10-22 LAB — RAPID URINE DRUG SCREEN, HOSP PERFORMED
AMPHETAMINES: NOT DETECTED
Barbiturates: NOT DETECTED
Benzodiazepines: NOT DETECTED
Cocaine: NOT DETECTED
Opiates: NOT DETECTED
TETRAHYDROCANNABINOL: NOT DETECTED

## 2013-10-22 LAB — CBC
HCT: 44.3 % (ref 39.0–52.0)
HEMOGLOBIN: 15.5 g/dL (ref 13.0–17.0)
MCH: 27.9 pg (ref 26.0–34.0)
MCHC: 35 g/dL (ref 30.0–36.0)
MCV: 79.7 fL (ref 78.0–100.0)
PLATELETS: 251 10*3/uL (ref 150–400)
RBC: 5.56 MIL/uL (ref 4.22–5.81)
RDW: 13 % (ref 11.5–15.5)
WBC: 4.1 10*3/uL (ref 4.0–10.5)

## 2013-10-22 LAB — ETHANOL: Alcohol, Ethyl (B): <11 mg/dL (ref 0–11)

## 2013-10-22 LAB — ACETAMINOPHEN LEVEL

## 2013-10-22 LAB — SALICYLATE LEVEL: Salicylate Lvl: <2 mg/dL — ABNORMAL LOW (ref 2.8–20.0)

## 2013-10-22 MED ORDER — IBUPROFEN 400 MG PO TABS: 600.0000 mg | ORAL_TABLET | Freq: Three times a day (TID) | ORAL | Status: DC | PRN

## 2013-10-22 MED ORDER — NICOTINE 21 MG/24HR TD PT24: 21.0000 mg | MEDICATED_PATCH | Freq: Every day | TRANSDERMAL | Status: DC

## 2013-10-22 MED ORDER — ACETAMINOPHEN 500 MG PO TABS
1000.0000 mg | ORAL_TABLET | Freq: Once | ORAL | Status: AC
  Administered 2013-10-22: 1000 mg via ORAL
  Filled 2013-10-22: qty 2

## 2013-10-22 MED ORDER — ZOLPIDEM TARTRATE 5 MG PO TABS: 5.0000 mg | ORAL_TABLET | Freq: Every evening | ORAL | Status: DC | PRN

## 2013-10-22 MED ORDER — LORAZEPAM 1 MG PO TABS
2.0000 mg | ORAL_TABLET | ORAL | Status: DC | PRN
  Administered 2013-10-22 (×2): 2 mg via ORAL
  Filled 2013-10-22 (×2): qty 2

## 2013-10-22 MED ORDER — LORAZEPAM 1 MG PO TABS: 1.0000 mg | ORAL_TABLET | Freq: Three times a day (TID) | ORAL | Status: DC | PRN

## 2013-10-22 MED ORDER — ONDANSETRON HCL 4 MG PO TABS: 4.0000 mg | ORAL_TABLET | Freq: Three times a day (TID) | ORAL | Status: DC | PRN

## 2013-10-22 NOTE — ED Notes (Signed)
Pt is being very belligerent towards ED tech. Dr. Fayrene Fearing made aware of pt's situation. Given IVC papers to Dr. Fayrene Fearing to sign.

## 2013-10-22 NOTE — ED Notes (Signed)
Pt requested brace back on wrist and ice pack applied over that

## 2013-10-22 NOTE — ED Notes (Signed)
Presents with homicidal ideation reports "I want to choke my girlfriend and drowned her" reports drinking ETOH and hearing voices.

## 2013-10-22 NOTE — ED Notes (Signed)
Pt has a brace on his left wrist. Removed d/t some swelling to thumb and ICE pack applied.

## 2013-10-22 NOTE — ED Notes (Signed)
Pt on the phone

## 2013-10-22 NOTE — ED Notes (Signed)
Pt sliding ice across the floor and told to pick it up. Pt got towel and started picking it up.

## 2013-10-22 NOTE — ED Notes (Signed)
Dr. James at the bedside.  

## 2013-10-23 ENCOUNTER — Encounter (HOSPITAL_COMMUNITY): Payer: Self-pay | Admitting: Emergency Medicine

## 2013-10-23 ENCOUNTER — Emergency Department (HOSPITAL_COMMUNITY)
Admission: EM | Admit: 2013-10-23 | Discharge: 2013-10-24 | Disposition: A | Discharge status: Home / Self Care | Payer: Medicaid Other | Source: Home / Self Care

## 2013-10-23 ENCOUNTER — Emergency Department (HOSPITAL_COMMUNITY)
Admission: EM | Admit: 2013-10-23 | Discharge: 2013-10-23 | Disposition: A | Discharge status: Home / Self Care | Payer: Medicaid Other | Attending: Emergency Medicine | Admitting: Emergency Medicine

## 2013-10-23 DIAGNOSIS — R443 Hallucinations, unspecified: Secondary | ICD-10-CM | POA: Diagnosis present

## 2013-10-23 DIAGNOSIS — J45909 Unspecified asthma, uncomplicated: Secondary | ICD-10-CM | POA: Insufficient documentation

## 2013-10-23 DIAGNOSIS — F3189 Other bipolar disorder: Secondary | ICD-10-CM | POA: Diagnosis not present

## 2013-10-23 DIAGNOSIS — F319 Bipolar disorder, unspecified: Secondary | ICD-10-CM | POA: Insufficient documentation

## 2013-10-23 DIAGNOSIS — R45851 Suicidal ideations: Secondary | ICD-10-CM | POA: Insufficient documentation

## 2013-10-23 DIAGNOSIS — Z79899 Other long term (current) drug therapy: Secondary | ICD-10-CM | POA: Insufficient documentation

## 2013-10-23 DIAGNOSIS — F259 Schizoaffective disorder, unspecified: Secondary | ICD-10-CM | POA: Insufficient documentation

## 2013-10-23 LAB — CBC WITH DIFFERENTIAL/PLATELET
BASOS ABS: 0 10*3/uL (ref 0.0–0.1)
BASOS PCT: 0 % (ref 0–1)
EOS ABS: 0.1 10*3/uL (ref 0.0–0.7)
Eosinophils Relative: 2 % (ref 0–5)
HCT: 42.1 % (ref 39.0–52.0)
Hemoglobin: 14.3 g/dL (ref 13.0–17.0)
Lymphocytes Relative: 24 % (ref 12–46)
Lymphs Abs: 1.1 10*3/uL (ref 0.7–4.0)
MCH: 26.6 pg (ref 26.0–34.0)
MCHC: 34 g/dL (ref 30.0–36.0)
MCV: 78.3 fL (ref 78.0–100.0)
Monocytes Absolute: 0.4 10*3/uL (ref 0.1–1.0)
Monocytes Relative: 10 % (ref 3–12)
NEUTROS ABS: 2.8 10*3/uL (ref 1.7–7.7)
Neutrophils Relative %: 64 % (ref 43–77)
PLATELETS: 242 10*3/uL (ref 150–400)
RBC: 5.38 MIL/uL (ref 4.22–5.81)
RDW: 12.9 % (ref 11.5–15.5)
WBC: 4.5 10*3/uL (ref 4.0–10.5)

## 2013-10-23 LAB — COMPREHENSIVE METABOLIC PANEL
ALK PHOS: 73 U/L (ref 39–117)
ALT: 16 U/L (ref 0–53)
AST: 22 U/L (ref 0–37)
Albumin: 4.3 g/dL (ref 3.5–5.2)
Anion gap: 14 (ref 5–15)
BUN: 8 mg/dL (ref 6–23)
CALCIUM: 9.7 mg/dL (ref 8.4–10.5)
CO2: 25 meq/L (ref 19–32)
Chloride: 101 mEq/L (ref 96–112)
Creatinine, Ser: 0.89 mg/dL (ref 0.50–1.35)
GLUCOSE: 103 mg/dL — AB (ref 70–99)
POTASSIUM: 4.1 meq/L (ref 3.7–5.3)
Sodium: 140 mEq/L (ref 137–147)
Total Bilirubin: 0.4 mg/dL (ref 0.3–1.2)
Total Protein: 8.2 g/dL (ref 6.0–8.3)

## 2013-10-23 LAB — ACETAMINOPHEN LEVEL: Acetaminophen (Tylenol), Serum: <15 ug/mL (ref 10–30)

## 2013-10-23 LAB — URINALYSIS, ROUTINE W REFLEX MICROSCOPIC
BILIRUBIN URINE: NEGATIVE
Glucose, UA: NEGATIVE mg/dL
Hgb urine dipstick: NEGATIVE
Ketones, ur: NEGATIVE mg/dL
Leukocytes, UA: NEGATIVE
Nitrite: NEGATIVE
PROTEIN: NEGATIVE mg/dL
Specific Gravity, Urine: 1.017 (ref 1.005–1.030)
UROBILINOGEN UA: 1 mg/dL (ref 0.0–1.0)
pH: 6 (ref 5.0–8.0)

## 2013-10-23 LAB — RAPID URINE DRUG SCREEN, HOSP PERFORMED
AMPHETAMINES: NOT DETECTED
BENZODIAZEPINES: NOT DETECTED
Barbiturates: NOT DETECTED
Cocaine: NOT DETECTED
Opiates: NOT DETECTED
Tetrahydrocannabinol: NOT DETECTED

## 2013-10-23 LAB — ETHANOL

## 2013-10-23 LAB — SALICYLATE LEVEL: Salicylate Lvl: <2 mg/dL — ABNORMAL LOW (ref 2.8–20.0)

## 2013-10-23 MED ORDER — IBUPROFEN 200 MG PO TABS: 600.0000 mg | ORAL_TABLET | Freq: Three times a day (TID) | ORAL | Status: DC | PRN

## 2013-10-23 MED ORDER — LORAZEPAM 1 MG PO TABS: 1.0000 mg | ORAL_TABLET | Freq: Three times a day (TID) | ORAL | Status: DC | PRN

## 2013-10-23 MED ORDER — ONDANSETRON HCL 4 MG PO TABS: 4.0000 mg | ORAL_TABLET | Freq: Three times a day (TID) | ORAL | Status: DC | PRN

## 2013-10-23 MED ORDER — NICOTINE 21 MG/24HR TD PT24: 21.0000 mg | MEDICATED_PATCH | Freq: Every day | TRANSDERMAL | Status: DC

## 2013-10-23 MED ORDER — CHLORPROMAZINE HCL 25 MG PO TABS: 100.0000 mg | ORAL_TABLET | Freq: Two times a day (BID) | ORAL | Status: DC

## 2013-10-23 MED ORDER — ACETAMINOPHEN 325 MG PO TABS: 650.0000 mg | ORAL_TABLET | ORAL | Status: DC | PRN

## 2013-10-23 MED ORDER — TRAZODONE HCL 50 MG PO TABS: 50.0000 mg | ORAL_TABLET | Freq: Every evening | ORAL | Status: DC | PRN

## 2013-10-23 MED ORDER — DIVALPROEX SODIUM ER 500 MG PO TB24
500.0000 mg | ORAL_TABLET | Freq: Two times a day (BID) | ORAL | Status: DC
  Filled 2013-10-23 (×2): qty 1

## 2013-10-23 MED ORDER — ALBUTEROL SULFATE HFA 108 (90 BASE) MCG/ACT IN AERS: 1.0000 | INHALATION_SPRAY | Freq: Four times a day (QID) | RESPIRATORY_TRACT | Status: DC | PRN

## 2013-10-23 MED ORDER — ARIPIPRAZOLE 10 MG PO TABS
10.0000 mg | ORAL_TABLET | Freq: Every day | ORAL | Status: DC
  Administered 2013-10-23: 10 mg via ORAL
  Filled 2013-10-23: qty 1

## 2013-10-23 MED ORDER — BENZTROPINE MESYLATE 1 MG PO TABS: 1.0000 mg | ORAL_TABLET | Freq: Two times a day (BID) | ORAL | Status: DC

## 2013-10-23 NOTE — ED Provider Notes (Signed)
Per CM patient can be d/c with his ACT team  Gerhard Munch, MD 10/23/13 [phone]

## 2013-10-23 NOTE — Consult Note (Signed)
[facility] Face-to-Face Psychiatry Consult   Reason for Consult:  Hallucinations Referring Physician:  EDP  [name] is an 23 y.o. male. Total Time spent with patient: 20 minutes  Assessment: AXIS I:  Schizoaffective Disorder AXIS II:  Deferred AXIS III:   Past Medical History  Diagnosis Date  . Asthma   . Bipolar 1 disorder   . ODD (oppositional defiant disorder)   . ADHD (attention deficit hyperactivity disorder)   . Schizophrenia   . Suicidal ideation   . Homicidal ideation   . Explosive personality disorder    AXIS IV:  other psychosocial or environmental problems and problems related to social environment AXIS V:  61-70 mild symptoms  Plan:  No evidence of imminent risk to self or others at present.  Dr. Kumar reviewed the patient and concurs with the plan.  Subjective:   [name] is a 23 y.o. male patient does not warrant admission.  HPI:  The patient has been in the ED frequently in the past two weeks with six discharges and no suicide attempts.  He has an ACT team who was called and came to visit with the patient.  [name] went home today after being discharged this morning, called the police, and come back claiming hallucinations.  This assessor talked to his ACT team about a different living situation since he has not stayed home in the past month and his mother is "unavailable" when the ACT team takes him home.  APS report made by our social worker since a SS check is being administered and he has been in our ED the past month.  [name] is at his baseline, stating he sees and hears things but does not appear to be responding in internal stimuli.  He appears to like to argue with staff and malinger at the hospital.  No suicidal/homicidal ideations, alcohol or drug abuse.  His story is the same tonight as it has been.  He refuses most medications due to "allergies" unfounded, does not appear to be vested in treatment. HPI Elements:   Location:  generalized. Quality:   chronic. Severity:  mild. Timing:  intermittent. Duration:  years. Context:  court date this Thursday.  Past Psychiatric History: Past Medical History  Diagnosis Date  . Asthma   . Bipolar 1 disorder   . ODD (oppositional defiant disorder)   . ADHD (attention deficit hyperactivity disorder)   . Schizophrenia   . Suicidal ideation   . Homicidal ideation   . Explosive personality disorder     reports that he has never smoked. He does not have any smokeless tobacco history on file. He reports that he does not drink alcohol or use illicit drugs. History reviewed. No pertinent family history.         Allergies:   Allergies  Allergen Reactions  . Carbamazepine Anaphylaxis, Other (See Comments) and Cough    Cough up blood  . Geodon [Ziprasidone Hcl] Anaphylaxis and Other (See Comments)    " Causes my throat to close"  . Haldol [Haloperidol Lactate] Anaphylaxis and Other (See Comments)    Patient reports that he stopped taking this because of throat swelling.   . Strattera [Atomoxetine] Anaphylaxis, Other (See Comments) and Cough    Cough up blood  . Other Nausea Only and Other (See Comments)    Apple Juice, stomach hurts and itchy throat     ACT Assessment Complete:  Yes:    Educational Status    Risk to Self: Risk to self with the   past 6 months Is patient at risk for suicide?: No Substance abuse history and/or treatment for substance abuse?: No  Risk to Others:    Abuse:    Prior Inpatient Therapy:    Prior Outpatient Therapy:    Additional Information:                    Objective: Blood pressure 114/77, pulse 110, temperature 98.4 F (36.9 C), temperature source Oral, resp. rate 16, SpO2 99.00%.There is no weight on file to calculate BMI. Results for orders placed during the hospital encounter of 10/23/13 (from the past 72 hour(s))  URINALYSIS, ROUTINE W REFLEX MICROSCOPIC     Status: None   Collection Time    10/23/13  4:17 PM      Result Value  Ref Range   Color, Urine YELLOW  YELLOW   APPearance CLEAR  CLEAR   Specific Gravity, Urine 1.017  1.005 - 1.030   pH 6.0  5.0 - 8.0   Glucose, UA NEGATIVE  NEGATIVE mg/dL   Hgb urine dipstick NEGATIVE  NEGATIVE   Bilirubin Urine NEGATIVE  NEGATIVE   Ketones, ur NEGATIVE  NEGATIVE mg/dL   Protein, ur NEGATIVE  NEGATIVE mg/dL   Urobilinogen, UA 1.0  0.0 - 1.0 mg/dL   Nitrite NEGATIVE  NEGATIVE   Leukocytes, UA NEGATIVE  NEGATIVE   Comment: MICROSCOPIC NOT DONE ON URINES WITH NEGATIVE PROTEIN, BLOOD, LEUKOCYTES, NITRITE, OR GLUCOSE <1000 mg/dL.  URINE RAPID DRUG SCREEN (HOSP PERFORMED)     Status: None   Collection Time    10/23/13  4:17 PM      Result Value Ref Range   Opiates NONE DETECTED  NONE DETECTED   Cocaine NONE DETECTED  NONE DETECTED   Benzodiazepines NONE DETECTED  NONE DETECTED   Amphetamines NONE DETECTED  NONE DETECTED   Tetrahydrocannabinol NONE DETECTED  NONE DETECTED   Barbiturates NONE DETECTED  NONE DETECTED   Comment:            DRUG SCREEN FOR MEDICAL PURPOSES     ONLY.  IF CONFIRMATION IS NEEDED     FOR ANY PURPOSE, NOTIFY LAB     WITHIN 5 DAYS.                LOWEST DETECTABLE LIMITS     FOR URINE DRUG SCREEN     Drug Class       Cutoff (ng/mL)     Amphetamine      1000     Barbiturate      200     Benzodiazepine   200     Tricyclics       300     Opiates          300     Cocaine          300     THC              50  CBC WITH DIFFERENTIAL     Status: None   Collection Time    10/23/13  4:24 PM      Result Value Ref Range   WBC 4.5  4.0 - 10.5 K/uL   RBC 5.38  4.22 - 5.81 MIL/uL   Hemoglobin 14.3  13.0 - 17.0 g/dL   HCT 42.1  39.0 - 52.0 %   MCV 78.3  78.0 - 100.0 fL   MCH 26.6  26.0 - 34.0 pg   MCHC 34.0  30.0 - 36.0 g/dL     RDW 12.9  11.5 - 15.5 %   Platelets 242  150 - 400 K/uL   Neutrophils Relative % 64  43 - 77 %   Neutro Abs 2.8  1.7 - 7.7 K/uL   Lymphocytes Relative 24  12 - 46 %   Lymphs Abs 1.1  0.7 - 4.0 K/uL   Monocytes Relative  10  3 - 12 %   Monocytes Absolute 0.4  0.1 - 1.0 K/uL   Eosinophils Relative 2  0 - 5 %   Eosinophils Absolute 0.1  0.0 - 0.7 K/uL   Basophils Relative 0  0 - 1 %   Basophils Absolute 0.0  0.0 - 0.1 K/uL  COMPREHENSIVE METABOLIC PANEL     Status: Abnormal   Collection Time    10/23/13  4:24 PM      Result Value Ref Range   Sodium 140  137 - 147 mEq/L   Potassium 4.1  3.7 - 5.3 mEq/L   Chloride 101  96 - 112 mEq/L   CO2 25  19 - 32 mEq/L   Glucose, Bld 103 (*) 70 - 99 mg/dL   BUN 8  6 - 23 mg/dL   Creatinine, Ser 0.89  0.50 - 1.35 mg/dL   Calcium 9.7  8.4 - 10.5 mg/dL   Total Protein 8.2  6.0 - 8.3 g/dL   Albumin 4.3  3.5 - 5.2 g/dL   AST 22  0 - 37 U/L   Comment: NO VISIBLE HEMOLYSIS     HEMOLYSIS AT THIS LEVEL MAY AFFECT RESULT   ALT 16  0 - 53 U/L   Alkaline Phosphatase 73  39 - 117 U/L   Total Bilirubin 0.4  0.3 - 1.2 mg/dL   GFR calc non Af Amer >90  >90 mL/min   GFR calc Af Amer >90  >90 mL/min   Comment: (NOTE)     The eGFR has been calculated using the CKD EPI equation.     This calculation has not been validated in all clinical situations.     eGFR's persistently <90 mL/min signify possible Chronic Kidney     Disease.   Anion gap 14  5 - 15  ACETAMINOPHEN LEVEL     Status: None   Collection Time    10/23/13  4:24 PM      Result Value Ref Range   Acetaminophen (Tylenol), Serum <15.0  10 - 30 ug/mL   Comment:            THERAPEUTIC CONCENTRATIONS VARY     SIGNIFICANTLY. A RANGE OF 10-30     ug/mL MAY BE AN EFFECTIVE     CONCENTRATION FOR MANY PATIENTS.     HOWEVER, SOME ARE BEST TREATED     AT CONCENTRATIONS OUTSIDE THIS     RANGE.     ACETAMINOPHEN CONCENTRATIONS     >150 ug/mL AT 4 HOURS AFTER     INGESTION AND >50 ug/mL AT 12     HOURS AFTER INGESTION ARE     OFTEN ASSOCIATED WITH TOXIC     REACTIONS.  SALICYLATE LEVEL     Status: Abnormal   Collection Time    10/23/13  4:24 PM      Result Value Ref Range   Salicylate Lvl <2.5 (*) 2.8 - 20.0 mg/dL   ETHANOL     Status: None   Collection Time    10/23/13  4:24 PM      Result Value Ref Range   Alcohol, Ethyl (B) <  11  0 - 11 mg/dL   Comment:            LOWEST DETECTABLE LIMIT FOR     SERUM ALCOHOL IS 11 mg/dL     FOR MEDICAL PURPOSES ONLY   Labs are reviewed and are pertinent for no medical issues noted.  Current Facility-Administered Medications  Medication Dose Route Frequency Provider Last Rate Last Dose  . acetaminophen (TYLENOL) tablet 650 mg  650 mg Oral Q4H PRN Kristen N Ward, DO      . albuterol (PROVENTIL HFA;VENTOLIN HFA) 108 (90 BASE) MCG/ACT inhaler 1 puff  1 puff Inhalation Q6H PRN Kristen N Ward, DO      . ARIPiprazole (ABILIFY) tablet 10 mg  10 mg Oral Daily Kristen N Ward, DO   10 mg at 10/23/13 1802  . benztropine (COGENTIN) tablet 1 mg  1 mg Oral BID Kristen N Ward, DO      . chlorproMAZINE (THORAZINE) tablet 100 mg  100 mg Oral BID Kristen N Ward, DO      . divalproex (DEPAKOTE ER) 24 hr tablet 500 mg  500 mg Oral BID Kristen N Ward, DO      . ibuprofen (ADVIL,MOTRIN) tablet 600 mg  600 mg Oral Q8H PRN Kristen N Ward, DO      . LORazepam (ATIVAN) tablet 1 mg  1 mg Oral Q8H PRN Kristen N Ward, DO      . nicotine (NICODERM CQ - dosed in mg/24 hours) patch 21 mg  21 mg Transdermal Daily Kristen N Ward, DO      . ondansetron (ZOFRAN) tablet 4 mg  4 mg Oral Q8H PRN Kristen N Ward, DO      . traZODone (DESYREL) tablet 50 mg  50 mg Oral QHS PRN Kristen N Ward, DO       Current Outpatient Prescriptions  Medication Sig Dispense Refill  . albuterol (PROVENTIL HFA;VENTOLIN HFA) 108 (90 BASE) MCG/ACT inhaler Inhale 1 puff into the lungs every 6 (six) hours as needed for wheezing or shortness of breath.      . ARIPiprazole (ABILIFY MAINTENA) 400 MG SUSR Inject 400 mg into the muscle every 30 (thirty) days.      . ARIPiprazole (ABILIFY) 10 MG tablet Take 1 tablet (10 mg total) by mouth daily.  14 tablet  0  . benztropine (COGENTIN) 1 MG tablet Take 1 tablet (1 mg total) by  mouth 2 (two) times daily.  60 tablet  0  . chlorproMAZINE (THORAZINE) 100 MG tablet Take 1 tablet (100 mg total) by mouth 2 (two) times daily.  60 tablet  0  . divalproex (DEPAKOTE ER) 500 MG 24 hr tablet Take 1 tablet (500 mg total) by mouth 2 (two) times daily.  60 tablet  0  . traZODone (DESYREL) 50 MG tablet Take 1 tablet (50 mg total) by mouth at bedtime as needed for sleep.  14 tablet  0   Psychiatric Specialty Exam:     Blood pressure 131/71, pulse 85, temperature 97.9 F (36.6 C), temperature source Oral, resp. rate 18, SpO2 100.00%.There is no weight on file to calculate BMI.  General Appearance: Casual  Eye Contact::  Good  Speech:  Normal Rate  Volume:  Normal  Mood:  Irritable  Affect:  Congruent  Thought Process:  Coherent  Orientation:  Full (Time, Place, and Person)  Thought Content:  WDL  Suicidal Thoughts:  No  Homicidal Thoughts:  No  Memory:  Immediate;   Good Recent;   Good   Remote;   Good  Judgement:  Fair  Insight:  Fair  Psychomotor Activity:  Normal  Concentration:  Good  Recall:  Good  Fund of [place]  Language: Fair  Akathisia:  No  Handed:  Right  AIMS (if indicated):     Assets:  Communication Skills Desire for Improvement Financial Resources/Insurance Housing Leisure Time Physical Health Resilience Social Support Transportation  Sleep:       Musculoskeletal: Strength & Muscle Tone: within normal limits Gait & Station: normal Patient leans: N/A  Treatment Plan Summary: Discharge home with his ACT team who will manage his care; Dr. Dwyane [name] reviewed, consulted, and concurs with the plan.  Waylan Boga, [facility] 10/23/2013 8:40 PM

## 2013-10-23 NOTE — ED Notes (Signed)
PATIENT DISCHARGED WITH ACT TEAM . MICHELLE WITH ACT HAS COME TO TRANSPORT PT HOME

## 2013-10-23 NOTE — ED Provider Notes (Signed)
CSN: [id number]     Arrival date & time 10/22/13  1456 History   First MD Initiated Contact with Patient 10/22/13 1536     Chief Complaint  Patient presents with  . Homicidal     HPI  Patient presents here with the complaint hears voices in his head telling him to punch and found his girlfriend. States she's been hearing voices all day. Been drinking today. Seen and evaluated less than 48 hours ago with similar complaint.. He has a Multimedia programmer. For frequent past evaluations with similar complaints. He carries a diagnosis of schizoaffective disorder.  Past Medical History  Diagnosis Date  . Asthma   . Bipolar 1 disorder   . ODD (oppositional defiant disorder)   . ADHD (attention deficit hyperactivity disorder)   . Schizophrenia   . Suicidal ideation   . Homicidal ideation   . Explosive personality disorder    History reviewed. No pertinent past surgical history. No family history on file. History  Substance Use Topics  . Smoking status: Never Smoker   . Smokeless tobacco: Not on file  . Alcohol Use: No     Comment: Patient denies    Review of Systems  Constitutional: Negative for fever, chills, diaphoresis, appetite change and fatigue.  HENT: Negative for mouth sores, sore throat and trouble swallowing.   Eyes: Negative for visual disturbance.  Respiratory: Negative for cough, chest tightness, shortness of breath and wheezing.   Cardiovascular: Negative for chest pain.  Gastrointestinal: Negative for nausea, vomiting, abdominal pain, diarrhea and abdominal distention.  Endocrine: Negative for polydipsia, polyphagia and polyuria.  Genitourinary: Negative for dysuria, frequency and hematuria.  Musculoskeletal: Negative for gait problem.  Skin: Negative for color change, pallor and rash.  Neurological: Negative for dizziness, syncope, light-headedness and headaches.  Hematological: Does not bruise/bleed easily.  Psychiatric/Behavioral: Positive for  hallucinations. Negative for behavioral problems and confusion.      Allergies  Carbamazepine; Geodon; Haldol; Strattera; and Other  Home Medications   Prior to Admission medications   Medication Sig Start Date End Date Taking? Authorizing Provider  albuterol (PROVENTIL HFA;VENTOLIN HFA) 108 (90 BASE) MCG/ACT inhaler Inhale 1 puff into the lungs every 6 (six) hours as needed for wheezing or shortness of breath.    Historical Provider, MD  ARIPiprazole (ABILIFY MAINTENA) 400 MG SUSR Inject 400 mg into the muscle every 30 (thirty) days.    Historical Provider, MD  ARIPiprazole (ABILIFY) 10 MG tablet Take 1 tablet (10 mg total) by mouth daily. 10/18/13   Shuvon Rankin, NP  benztropine (COGENTIN) 1 MG tablet Take 1 tablet (1 mg total) by mouth 2 (two) times daily. 10/13/13   Benjamine Mola, FNP  chlorproMAZINE (THORAZINE) 100 MG tablet Take 100 mg by mouth 2 (two) times daily.    Historical Provider, MD  divalproex (DEPAKOTE ER) 500 MG 24 hr tablet Take 500 mg by mouth 2 (two) times daily.    Historical Provider, MD  traZODone (DESYREL) 50 MG tablet Take 50 mg by mouth at bedtime as needed for sleep.    Historical Provider, MD   BP 142/74  Pulse 80  Temp(Src) 98 F (36.7 C) (Oral)  Resp 20  Ht 5' 10.5" (1.791 m)  Wt 274 lb (124.286 kg)  BMI 38.75 kg/m2  SpO2 99% Physical Exam  Constitutional: He is oriented to person, place, and time. He appears well-developed and well-nourished. No distress.  HENT:  Head: Normocephalic.  Eyes: Conjunctivae are normal. Pupils are equal, round, and reactive to  light. No scleral icterus.  Neck: Normal range of motion. Neck supple. No thyromegaly present.  Cardiovascular: Normal rate and regular rhythm.  Exam reveals no gallop and no friction rub.   No murmur heard. Pulmonary/Chest: Effort normal and breath sounds normal. No respiratory distress. He has no wheezes. He has no rales.  Abdominal: Soft. Bowel sounds are normal. He exhibits no distension.  There is no tenderness. There is no rebound.  Musculoskeletal: Normal range of motion.  Neurological: He is alert and oriented to person, place, and time.  Skin: Skin is warm and dry. No rash noted.  Psychiatric:  Loud pacing. Demands juice, and a male sitter.    ED Course  Procedures (including critical care time) Labs Review Labs Reviewed  COMPREHENSIVE METABOLIC PANEL - Abnormal; Notable for the following:    Total Protein 8.4 (*)    All other components within normal limits  SALICYLATE LEVEL - Abnormal; Notable for the following:    Salicylate Lvl <5.2 (*)    All other components within normal limits  ACETAMINOPHEN LEVEL  CBC  ETHANOL  URINE RAPID DRUG SCREEN (HOSP PERFORMED)    Imaging Review Dg Wrist Complete Left  10/24/2013   CLINICAL DATA:  [place] 3 days ago.  Left wrist pain.  EXAM: LEFT WRIST - COMPLETE 3+ VIEW  COMPARISON:  None.  FINDINGS: There is no evidence of fracture or dislocation. There is no evidence of arthropathy or other focal bone abnormality. Soft tissues are unremarkable.  IMPRESSION: Negative.   Electronically Signed   By: Lucienne Capers M.D.   On: 10/24/2013 06:46     EKG Interpretation None      MDM   Final diagnoses:  Other schizoaffective disorders    And patient ultimately does agree to be taken in the morning to his community act psychiatrist at [facility]. His ACT Coordinator, Mariann Laster, presented here and has met with Sunny.  He is agreeable to this. He also states that he thinks that he would be ok if his mother came to get him in the morning.     Mariann Laster requests that pts mother be contacted in the morning. Also requests that she be called at [phone]. She would like to meet with pt and his mother to hopefully form a better plan for Demarie.  He continues to voice that he hears the voice in his head, but that he has no intention of acting on its commands.    Tanna Furry, MD 10/24/13 [phone]

## 2013-10-23 NOTE — ED Provider Notes (Signed)
TIME SEEN: 4:00 PM  CHIEF COMPLAINT: Psychosis  HPI: Patient is a 23 y.o. M with history of schizophrenia, ADHD, ODD, bipolar disorder, explosive personality disorder who is here in the emergency department with multiple psychiatric complaints. He states he is here today because he is seeing things and hearing voices telling him to insult his girlfriend. The voices are not telling him to hurt himself or anyone else. He states he is worried that there are terrorists here in the emergency department trying to hurt him. He states that he stopped taking his medication 2 days ago when he dumped it all down the toilet. Denies any drug use he states he has had alcohol. Denies any medical complaints.  ROS: See HPI Constitutional: no fever  Eyes: no drainage  ENT: no runny nose   Cardiovascular:  no chest pain  Resp: no SOB  GI: no vomiting GU: no dysuria Integumentary: no rash  Allergy: no hives  Musculoskeletal: no leg swelling  Neurological: no slurred speech ROS otherwise negative  PAST MEDICAL HISTORY/PAST SURGICAL HISTORY:  Past Medical History  Diagnosis Date  . Asthma   . Bipolar 1 disorder   . ODD (oppositional defiant disorder)   . ADHD (attention deficit hyperactivity disorder)   . Schizophrenia   . Suicidal ideation   . Homicidal ideation   . Explosive personality disorder     MEDICATIONS:  Prior to Admission medications   Medication Sig Start Date End Date Taking? Authorizing Provider  albuterol (PROVENTIL HFA;VENTOLIN HFA) 108 (90 BASE) MCG/ACT inhaler Inhale 1 puff into the lungs every 6 (six) hours as needed for wheezing or shortness of breath.   Yes Historical Provider, MD  ARIPiprazole (ABILIFY MAINTENA) 400 MG SUSR Inject 400 mg into the muscle every 30 (thirty) days.   Yes Historical Provider, MD  ARIPiprazole (ABILIFY) 10 MG tablet Take 1 tablet (10 mg total) by mouth daily. 10/18/13  Yes Shuvon Rankin, NP  benztropine (COGENTIN) 1 MG tablet Take 1 tablet (1 mg  total) by mouth 2 (two) times daily. 10/13/13  Yes Beau Fanny, FNP  chlorproMAZINE (THORAZINE) 100 MG tablet Take 1 tablet (100 mg total) by mouth 2 (two) times daily. 10/13/13  Yes Beau Fanny, FNP  divalproex (DEPAKOTE ER) 500 MG 24 hr tablet Take 1 tablet (500 mg total) by mouth 2 (two) times daily. 10/13/13  Yes Beau Fanny, FNP  traZODone (DESYREL) 50 MG tablet Take 1 tablet (50 mg total) by mouth at bedtime as needed for sleep. 10/13/13  Yes Beau Fanny, FNP    ALLERGIES:  Allergies  Allergen Reactions  . Carbamazepine Anaphylaxis, Other (See Comments) and Cough    Cough up blood  . Geodon [Ziprasidone Hcl] Anaphylaxis and Other (See Comments)    " Causes my throat to close"  . Haldol [Haloperidol Lactate] Anaphylaxis and Other (See Comments)    Patient reports that he stopped taking this because of throat swelling.   [name] [Atomoxetine] Anaphylaxis, Other (See Comments) and Cough    Cough up blood  . Other Nausea Only and Other (See Comments)    Apple Juice, stomach hurts and itchy throat     SOCIAL HISTORY:  History  Substance Use Topics  . Smoking status: Never Smoker   . Smokeless tobacco: Not on file  . Alcohol Use: No     Comment: Patient denies    FAMILY HISTORY: History reviewed. No pertinent family history.  EXAM: BP 114/77  Pulse 110  Temp(Src) 98.4 F (  36.9 C) (Oral)  Resp 16  SpO2 99% CONSTITUTIONAL: Alert and oriented and responds appropriately to questions. Well-appearing; well-nourished HEAD: Normocephalic EYES: Conjunctivae clear, PERRL ENT: normal nose; no rhinorrhea; moist mucous membranes; pharynx without lesions noted NECK: Supple, no meningismus, no LAD  CARD: RRR; S1 and S2 appreciated; no murmurs, no clicks, no rubs, no gallops RESP: Normal chest excursion without splinting or tachypnea; breath sounds clear and equal bilaterally; no wheezes, no rhonchi, no rales,  ABD/GI: Normal bowel sounds; non-distended; soft, non-tender,  no rebound, no guarding BACK:  The back appears normal and is non-tender to palpation, there is no CVA tenderness EXT: Normal ROM in all joints; non-tender to palpation; no edema; normal capillary refill; no cyanosis    SKIN: Normal color for age and race; warm NEURO: Moves all extremities equally PSYCH: Patient denies SI or HI. Endorses visual and auditory hallucinations. He has loud, rapid, pressured speech. He is paranoid. Grooming and personal hygiene are appropriate.  MEDICAL DECISION MAKING: Patient here with bizarre behavior, paranoia, mania, hallucinations. He is here frequently for similar symptoms. He has a care plan and ACT team.  ACT team has been contacted who will see the patient in the emergency department.  He has been IVC'ed as I feel this time he is a danger to himself and others and needs to be evaluated by his ACT team who knows what his baseline behavior is better than myself.  ED PROGRESS: Screening labs and urine are unremarkable. Act team is at bedside and feels patient came to home. Patient seen by social work and also by Publishing rights manager with psychiatry who are all in agreement with this plan. We'll discharge home. IVC has been rescinded.     Layla Maw Ward, DO 10/23/13 2102

## 2013-10-23 NOTE — ED Notes (Signed)
Patient has two bags of belongings in locker 26. 

## 2013-10-23 NOTE — Progress Notes (Signed)
CSW completed an APS report with [name] with DSS they will respond 24 to 72 hours.      [name], MSW, [place], 10/23/2013 Evening Clinical Social Worker [phone]

## 2013-10-23 NOTE — Discharge Instructions (Signed)
Schizoaffective Disorder Schizoaffective disorder (ScAD) is a mental illness. It causes symptoms that are a mixture of schizophrenia (a psychotic disorder) and an affective (mood) disorder. The schizophrenic symptoms may include delusions, hallucinations, or odd behavior. The mood symptoms may be similar to major depression or bipolar disorder. ScAD may interfere with personal relationships or normal daily activities. People with ScAD are at increased risk for job loss, social isolation,physical health problems, anxiety and substance use disorders, and suicide. ScAD usually occurs in cycles. Periods of severe symptoms are followed by periods of less severe symptoms or improvement. The illness affects men and women equally but usually appears at an earlier age (teenage or early adult years) in men. People who have family members with schizophrenia, bipolar disorder, or ScAD are at higher risk of developing ScAD. SYMPTOMS  At any one time, people with ScAD may have psychotic symptoms only or both psychotic and mood symptoms. The psychotic symptoms include one or more of the following:  Hearing, seeing, or feeling things that are not there (hallucinations).   Having fixed, false beliefs (delusions). The delusions usually are of being attacked, harassed, cheated, persecuted, or conspired against (paranoid delusions).  Speaking in a way that makes no sense to others (disorganized speech). The psychotic symptoms of ScAD may also include confusing or odd behavior or any of the negative symptoms of schizophrenia. These include loss of motivation for normal daily activities, such as bathing or grooming, withdrawal from other people, and lack of emotions.  The mood symptoms of ScAD occur more often than not. They resemble major depressive disorder or bipolar mania. Symptoms of major depression include depressed mood and four or more of the following:  Loss of interest in usually pleasurable activities  (anhedonia).  Sleeping more or less than normal.  Feeling worthless or excessively guilty.  Lack of energy or motivation.  Trouble concentrating.  Eating more or less than usual.  Thinking a lot about death or suicide. Symptoms of bipolar mania include abnormally elevated or irritable mood and increased energy or activity, plus three or more of the following:   More confidence than normal or feeling that you are able to do anything (grandiosity).  Feeling rested with less sleep than normal.   Being easily distracted.   Talking more than usual or feeling pressured to keep talking.   Feeling that your thoughts are racing.  Engaging in high-risk activities such as buying sprees or foolish business decisions. DIAGNOSIS  ScAD is diagnosed through an assessment by your health care provider. Your health care provider will observe and ask questions about your thoughts, behavior, mood, and ability to function in daily life. Your health care provider may also ask questions about your medical history and use of drugs, including prescription medicines. Your health care provider may also order blood tests and imaging exams. Certain medical conditions and substances can cause symptoms that resemble ScAD. Your health care provider may refer you to a mental health specialist for evaluation.  ScAD is divided into two types. The depressive type is diagnosed if your mood symptoms are limited to major depression. The bipolar type is diagnosed if your mood symptoms are manic or a mixture of manic and depressive symptoms TREATMENT  ScAD is usually a lifelong illness. Long-term treatment is necessary. The following treatments are available:  Medicine. Different types of medicine are used to treat ScAD. The exact combination depends on the type and severity of your symptoms. Antipsychotic medicine is used to control psychotic symptomssuch as delusions, paranoia,   and hallucinations. Mood stabilizers can  even the highs and lows of bipolar manic mood swings. Antidepressant medicines are used to treat major depressive symptoms.  Counseling or talk therapy. Individual, group, or family counseling may be helpful in providing education, support, and guidance. Many people with ScAD also benefit from social skills and job skills (vocational) training. A combination of medicine and counseling is usually best for managing the disorder over time. A procedure in which electricity is applied to the brain through the scalp (electroconvulsive therapy) may be used to treat people with severe manic symptoms that do not respond to medicine and counseling. HOME CARE INSTRUCTIONS   Take all your medicine as prescribed.  Check with your health care provider before starting new prescription or over-the-counter medicines.  Keep all follow up appointments with your health care provider. SEEK MEDICAL CARE IF:   If you are not able to take your medicines as prescribed.  If your symptoms get worse. SEEK IMMEDIATE MEDICAL CARE IF:   You have serious thoughts about hurting yourself or others. Document Released: 06/25/2006 Document Revised: 06/29/2013 Document Reviewed: 09/26/2012 Exit[employer] Patient Information 2015 [employer]. This information is not intended to replace advice given to you by your health care provider. Make sure you discuss any questions you have with your health care provider.  

## 2013-10-23 NOTE — ED Notes (Signed)
Pt wrapped bed sheet around his neck, pt redirected to not to wrap anything around his neck. All bed sheets removed from pt room.

## 2013-10-23 NOTE — Progress Notes (Signed)
Please review ED Care Plan  CSW spoke with Dr. Elesa Massed and she is now aware of the ED Care Plan initiated by Dr. Lucianne Muss therefore TTS consult was canceled.    CSW called and left a message for [facility] [phone].  CSW called ACTT hotline and was connected to Clement Sayres who will contact his supervisor then have someone call back.   [name], MSW, [place], 10/23/2013 Evening Clinical Social Worker [phone]

## 2013-10-23 NOTE — ED Notes (Signed)
Per Care Plan established for pt crisis line is to contacted on arrival, call made, crisis line individual will return call to ED

## 2013-10-23 NOTE — ED Notes (Signed)
CASE MANAGEMENT AS WELL AS PT HAVE MADE ATTEMPTS TO CONTACT PT MOTHER. NO ANSWER AT ALL NUMBERS IN CHART AND PROVIDED BY PATIENT. PATIENT HAS ALSO TRIED TO REACH HIS MOTHER. THE ACT TEAM IS AWARE OF DISCHARGE AND WILL COME PICK UP PT AND TAKE HIM HOME

## 2013-10-23 NOTE — ED Notes (Signed)
Pt is making his first phone call.

## 2013-10-23 NOTE — BH Assessment (Signed)
Care Plan Guidelines: Regardless of whether or not a patient has a care plan, every ED patient is entitled to a Medical Screening Exam (MSE) per EMTALA to determine if an Emergency Medical Condition [facility]) exists. Care plans are guidelines, and do not constitute a standard of care or substitute for clinical judgment.  Patient Name: [name]  Care Plan updated: Nelly Rout, MD 10/19/2013  Mental Health Provider: [facility] ACTT team  Community ACT Team contact info: [facility] ACT[facility] crisis line is [phone]. Team Lead is Darrick Penna [phone], contact a team member if the patient presents with suicidal/homicidal ideations or hallucinations with command.    1^ Diagnosis: Schizoaffective D/O, Bipolar 1 D/O, Explosive Personality D/O, Suicidal ideations, Homicidal Ideations, Command Hallucinations   Background:  .    Patient is currently his own guardian living with his biological mother.   .    He has a history of 20 encounters with the [facility] since 07/01/2013 for similar chief complaints.   .    Patient is currently participating with [name] ACTT team as an enhanced community services providing a holistic scope of services.   .    Patient has noticeable malingering symptoms or behaviors that would include him requesting multiple Emergency visits.   .    The ACTT team has a psychiatrist, licensed clinical provider (LCSW, LPC, LCAS, or etc), and Herbalist, and an Tree surgeon to assist patient with crisis interventions, preventions, medication monitoring, and case management.  .    Patient presented to the Emergency Department with suicidal/homicidal ideations and auditory hallucinations with command. Psychiatry has felt no therapeutic benefit from prior inpatient psych stays.  Recently patient has been discharged from the emergency department and [facility] 4 times in the past 10 days.   Plan: .    Provide MSE as seen as possible after arrival to ED. If  patient presents to [facility], inform patient immediately that his ACTT team will be contacted for an evaluation of symptoms.  Patient will not be transferred to the Psych ED unit because that is a familiar comfortable environment for him.   .    Once Patient is assessed by his ACTT team, if his symptoms are identified as an Emergent need such as suicidal/homicidal ideations with intent and plan of harm to self or others he then will be assessed by ED psych staff.   .    Focus on presence or absence of objective signs/symptoms .    Upon evaluation, if Patient is not in an acute psychiatric crisis, immediately discharge patient.  Notify security for escort or law enforcement if necessary.   .    If no emergent medical or psychiatric conditions are present, discharge patient to follow up with his ACTT team provider [facility] [phone].      Per plan this Clinical research associate contacted ACTT crisis line and Onalee Hua connected with on-call staff, Clement Sayres. Per Fayrene Fearing he can come to assess pt around 0830 -0900.    Spoke with EDP, Dr. Preston Fleeting is in agreement with ACTT assessment later in AM.   Informed RN of ED plan and assessment to occur later in morning.    Clista Bernhardt, [facility] Triage Specialist 10/23/2013 2:03 AM

## 2013-10-23 NOTE — ED Notes (Signed)
Crisis Intervention Museum/gallery curator) here to see patient.  NP for psychiatry in to see patient.

## 2013-10-23 NOTE — Consult Note (Signed)
Case discussed, agree with plan. Patient well known to service, can be discharged to ACT team and IVC can be rescended.

## 2013-10-23 NOTE — ED Notes (Signed)
Pt given a toothbrush and toothpaste. 

## 2013-10-23 NOTE — ED Notes (Addendum)
PT VALUABLES AND BELONGINGS RETURNED TO HIM

## 2013-10-23 NOTE — Progress Notes (Addendum)
CM contacted ACT team leader with [name], and she stated that she was contacted the previous evening and came out and spoke with [name] and the EDP, Dr. Katrinka Blazing. She stated that the patient received his medications and he received his Abilify shot the previous day and she spoke with him. [name] stated that when the patient is discharged to contact the mother for pick up. She stated that she would like to speak with the patient's mother if she agrees to pick up the patient to assess the patient's home situation. This CM discussed the plan with the ED RN, [name]. Will follow with patient plan and contact [name] upon patient discharge. [name] Cava, RN, BSN, Case Manager 10/23/2013 8:36 AM  This CM contacted [name], with [name], upon notice of patient discharge. This CM attempted to contact the patient's mother [name] at [phone], and [phone] and no answer at either number and this was conveyed to [facility]. [name] informed this CM that [name] from the ACT team will come to the ED and take [name] home. [name] did arrive to the ED and left the ED with [name] to take home. [name] Cava, RN, BSN, Case Manager 10/23/2013 12:23 PM

## 2013-10-23 NOTE — ED Notes (Signed)
Pt here from home. Pt mom called [employer] b/c pt has been altered.  Pt having hallucinations.  Not taking home meds.  States he hasn't taken meds for a couple of days.  That he "flushed them down toilet".  MD at bedside during assessment.  Pt denies SI/HI.  Pt has psych history. Seen 2 days ago at [facility] and released.

## 2013-10-23 NOTE — ED Notes (Signed)
[employer] arrangement by PD for transport, pt to wait in lobby

## 2013-10-24 ENCOUNTER — Emergency Department (EMERGENCY_DEPARTMENT_HOSPITAL)
Admission: EM | Admit: 2013-10-24 | Discharge: 2013-10-25 | Disposition: A | Discharge status: Home / Self Care | Payer: Medicaid Other | Source: Home / Self Care | Attending: Emergency Medicine | Admitting: Emergency Medicine

## 2013-10-24 ENCOUNTER — Encounter (HOSPITAL_COMMUNITY): Payer: Self-pay | Admitting: Emergency Medicine

## 2013-10-24 ENCOUNTER — Emergency Department (HOSPITAL_COMMUNITY)
Admission: EM | Admit: 2013-10-24 | Discharge: 2013-10-24 | Disposition: A | Discharge status: Home / Self Care | Payer: Medicaid Other | Attending: Emergency Medicine | Admitting: Emergency Medicine

## 2013-10-24 ENCOUNTER — Emergency Department (HOSPITAL_COMMUNITY): Payer: Medicaid Other

## 2013-10-24 DIAGNOSIS — Z79899 Other long term (current) drug therapy: Secondary | ICD-10-CM | POA: Diagnosis not present

## 2013-10-24 DIAGNOSIS — R45851 Suicidal ideations: Secondary | ICD-10-CM | POA: Insufficient documentation

## 2013-10-24 DIAGNOSIS — J45909 Unspecified asthma, uncomplicated: Secondary | ICD-10-CM | POA: Insufficient documentation

## 2013-10-24 DIAGNOSIS — R443 Hallucinations, unspecified: Secondary | ICD-10-CM | POA: Insufficient documentation

## 2013-10-24 DIAGNOSIS — F259 Schizoaffective disorder, unspecified: Secondary | ICD-10-CM | POA: Diagnosis not present

## 2013-10-24 DIAGNOSIS — F319 Bipolar disorder, unspecified: Secondary | ICD-10-CM | POA: Diagnosis not present

## 2013-10-24 MED ORDER — DIVALPROEX SODIUM ER 500 MG PO TB24
500.0000 mg | ORAL_TABLET | Freq: Two times a day (BID) | ORAL | Status: DC
  Filled 2013-10-24 (×2): qty 1

## 2013-10-24 MED ORDER — ALBUTEROL SULFATE HFA 108 (90 BASE) MCG/ACT IN AERS: 1.0000 | INHALATION_SPRAY | Freq: Four times a day (QID) | RESPIRATORY_TRACT | Status: DC | PRN

## 2013-10-24 MED ORDER — TRAZODONE HCL 50 MG PO TABS: 50.0000 mg | ORAL_TABLET | Freq: Every evening | ORAL | Status: DC | PRN

## 2013-10-24 MED ORDER — BENZTROPINE MESYLATE 1 MG PO TABS: 1.0000 mg | ORAL_TABLET | Freq: Two times a day (BID) | ORAL | Status: DC

## 2013-10-24 MED ORDER — BENZTROPINE MESYLATE 1 MG PO TABS
1.0000 mg | ORAL_TABLET | Freq: Two times a day (BID) | ORAL | Status: DC
  Administered 2013-10-24 – 2013-10-25 (×2): 1 mg via ORAL
  Filled 2013-10-24 (×2): qty 1

## 2013-10-24 MED ORDER — DIVALPROEX SODIUM ER 500 MG PO TB24
500.0000 mg | ORAL_TABLET | Freq: Two times a day (BID) | ORAL | Status: DC
  Administered 2013-10-24 – 2013-10-25 (×2): 500 mg via ORAL
  Filled 2013-10-24 (×3): qty 1

## 2013-10-24 MED ORDER — CHLORPROMAZINE HCL 25 MG PO TABS: 100.0000 mg | ORAL_TABLET | Freq: Two times a day (BID) | ORAL | Status: DC

## 2013-10-24 MED ORDER — ARIPIPRAZOLE 10 MG PO TABS
10.0000 mg | ORAL_TABLET | Freq: Every day | ORAL | Status: DC
  Administered 2013-10-24 – 2013-10-25 (×2): 10 mg via ORAL
  Filled 2013-10-24 (×2): qty 1

## 2013-10-24 MED ORDER — ALBUTEROL SULFATE (2.5 MG/3ML) 0.083% IN NEBU: 2.5000 mg | INHALATION_SOLUTION | Freq: Four times a day (QID) | RESPIRATORY_TRACT | Status: DC | PRN

## 2013-10-24 MED ORDER — ONDANSETRON HCL 4 MG PO TABS: 4.0000 mg | ORAL_TABLET | Freq: Three times a day (TID) | ORAL | Status: DC | PRN

## 2013-10-24 MED ORDER — CHLORPROMAZINE HCL 25 MG PO TABS
100.0000 mg | ORAL_TABLET | Freq: Two times a day (BID) | ORAL | Status: DC
  Administered 2013-10-24 – 2013-10-25 (×2): 100 mg via ORAL
  Filled 2013-10-24 (×2): qty 4

## 2013-10-24 MED ORDER — IBUPROFEN 200 MG PO TABS
400.0000 mg | ORAL_TABLET | Freq: Four times a day (QID) | ORAL | Status: DC | PRN
  Administered 2013-10-24 – 2013-10-25 (×3): 400 mg via ORAL
  Filled 2013-10-24 (×3): qty 2

## 2013-10-24 MED ORDER — ARIPIPRAZOLE 10 MG PO TABS
10.0000 mg | ORAL_TABLET | Freq: Every day | ORAL | Status: DC
  Filled 2013-10-24: qty 1

## 2013-10-24 MED ORDER — ACETAMINOPHEN 325 MG PO TABS
650.0000 mg | ORAL_TABLET | Freq: Once | ORAL | Status: AC
  Administered 2013-10-24: 650 mg via ORAL
  Filled 2013-10-24: qty 2

## 2013-10-24 NOTE — ED Notes (Signed)
Patient states that he doesn't want to go with ACT team, states that he will leave with them but as soon as he gets outside he will walk home.

## 2013-10-24 NOTE — ED Notes (Signed)
Pt came in the wled voluntarily but could possibly be ivc'd. He stated he is having suicide and homicidal ideation. He has been in and out of this facility 4 times in 2 days. He can be manipulative in his demeanor and unpredictable. He also stated he is having auditory hallucinations. He has on a left wrist support. Pt came in at shift change and report was given to gary,RN.

## 2013-10-24 NOTE — ED Notes (Addendum)
He is taken to psych. E.D. 43 after report given to [facility], Charity fundraiser.  His belongings are locked in locker #43.  He has bee wanded by security and dressed out in wine scrubs.

## 2013-10-24 NOTE — Progress Notes (Signed)
ACTT team member Fayrene Fearing picked patient up from the hospital. Please reconsult if future social work needs arise. CSW signing off.   Jetta Lout, LCSWA Weekend CSW [phone]

## 2013-10-24 NOTE — ED Notes (Signed)
Clinician spoke with Lorre Nick, MD who recommends long term placement for patient at this time oppose to discharge since patient was at [facility] earlier today and will most likely return to the ED. MD spoke to Nanine Means, NP about plan to coordinate placement for patient on Monday with his current [facility] ACTT team. NP agreeable with plan proposed by MD. Clinician telephoned patient's ACTT team on call staff member Fayrene Fearing to notify him that patient will not be discharged at this time.   Janann Colonel. MSW, LCSW Therapeutic Triage Services-Triage Specialist   Phone: [phone] Fax: [phone]

## 2013-10-24 NOTE — ED Notes (Signed)
AC/House coverage notified for pt.'s sitter.

## 2013-10-24 NOTE — ED Provider Notes (Signed)
CSN: [id number]     Arrival date & time 10/23/13  2349 History   None    Chief Complaint  Patient presents with  . Psychiatric Evaluation     (Consider location/radiation/quality/duration/timing/severity/associated sxs/prior Treatment) HPI Comments: The patient is a 23 year old male who is well-known to this emergency department because of multiple visits for psychiatric complaints. He has been here multiple times this week for similar symptoms. At this time the patient says he has no complaints, no physical complaints, no psychiatric complaints and is not suicidal. He was already seen earlier in the day, he was sent home in a stable condition at this time he appears completely stable as well without any complaints whatsoever. He did have some alcohol this evening and after drinking he had some passive suicidal comments to his mother but at this time he states he has no suicidal thoughts whatsoever.  The history is provided by the patient.    Past Medical History  Diagnosis Date  . Asthma   . Bipolar 1 disorder   . ODD (oppositional defiant disorder)   . ADHD (attention deficit hyperactivity disorder)   . Schizophrenia   . Suicidal ideation   . Homicidal ideation   . Explosive personality disorder    History reviewed. No pertinent past surgical history. History reviewed. No pertinent family history. History  Substance Use Topics  . Smoking status: Never Smoker   . Smokeless tobacco: Not on file  . Alcohol Use: No     Comment: Patient denies    Review of Systems  All other systems reviewed and are negative.     Allergies  Carbamazepine; Geodon; Haldol; Strattera; and Other  Home Medications   Prior to Admission medications   Medication Sig Start Date End Date Taking? Authorizing Provider  ARIPiprazole (ABILIFY) 10 MG tablet Take 1 tablet (10 mg total) by mouth daily. 10/18/13  Yes Shuvon Rankin, NP  benztropine (COGENTIN) 1 MG tablet Take 1 tablet (1 mg total) by mouth  2 (two) times daily. 10/13/13  Yes Beau Fanny, FNP  chlorproMAZINE (THORAZINE) 100 MG tablet Take 100 mg by mouth 2 (two) times daily.   Yes Historical Provider, MD  divalproex (DEPAKOTE ER) 500 MG 24 hr tablet Take 500 mg by mouth 2 (two) times daily.   Yes Historical Provider, MD  traZODone (DESYREL) 50 MG tablet Take 50 mg by mouth at bedtime as needed for sleep.   Yes Historical Provider, MD  albuterol (PROVENTIL HFA;VENTOLIN HFA) 108 (90 BASE) MCG/ACT inhaler Inhale 1 puff into the lungs every 6 (six) hours as needed for wheezing or shortness of breath.    Historical Provider, MD  ARIPiprazole (ABILIFY MAINTENA) 400 MG SUSR Inject 400 mg into the muscle every 30 (thirty) days.    Historical Provider, MD   BP 140/87  Pulse 96  Temp(Src) 98.4 F (36.9 C) (Oral)  Resp 16  SpO2 96% Physical Exam  Nursing note and vitals reviewed. Constitutional: He appears well-developed and well-nourished.  HENT:  Head: Normocephalic and atraumatic.  Eyes: Conjunctivae are normal. Right eye exhibits no discharge. Left eye exhibits no discharge.  Pulmonary/Chest: Effort normal. No respiratory distress.  Neurological: He is alert. Coordination normal.  Skin: Skin is warm and dry. No rash noted. He is not diaphoretic. No erythema.  Psychiatric:  Very calm, peaceful, answers questions appropriately, not responding to internal stimuli, denies suicide or homicidal thoughts    ED Course  Procedures (including critical care time) Labs Review Labs Reviewed - No data  to display  Imaging Review No results found.   EKG Interpretation None      MDM   Final diagnoses:  Schizoaffective disorder, unspecified type    The patient is well appearing with normal vital signs and appears stable for discharge. He does have a history of malingering in the emergency department, at this time he does not appear to have a psychiatric emergency requiring acute stabilization.    Vida Roller, MD 10/24/13  [phone]

## 2013-10-24 NOTE — ED Notes (Signed)
Pt ambulating independently w/ steady gait on d/c in no acute distress, A&Ox4. D/c instructions reviewed w/ pt - pt denies any further questions or concerns at present.  

## 2013-10-24 NOTE — Progress Notes (Signed)
CSW contacted the ACT Team [phone]) to discuss disposition & awaiting a call back.  CSW will continue to follow & update staff with discharge plan.

## 2013-10-24 NOTE — BH Assessment (Signed)
[facility] - Closed Assessment Progress Note   Update:  Spoke with Fredric Mare [phone]), [facility] Social Worker, @ [phone], who stated she will call [facility] ACTT team to pick pt up from ED to transport to [facility] until long term placement cam be found for the pt per his ED Care plan.  Casimer Lanius, MS, [facility] Licensed Professional Counselor Triage Specialist

## 2013-10-24 NOTE — ED Notes (Signed)
Pt. arrived with EMS and [employer] officer from home , family reported pt. is having auditory /visual hallucinations and laceration at left forearm , pt. hesitant to speak with triage nurse requesting male caregiver.

## 2013-10-24 NOTE — ED Notes (Signed)
He has scraped his left ant. Forearm in a purported suicide attempt/gesture.  He is met by our TTS staff shortly after arrival, and they discuss pt's. Case, including his care plan with Dr. Zenia Resides.  He is profane and stating that no one here wants to help him.  Our C.N. Is also aware and as I write this our security staff is dealing with him.  He states he is hearing voices tell him to hurt people and kill people and "sooner or later I will have to do what the voices tell me to do". He further states "They just keep seeing me and giving me pills and sending me home--that isn't helping me and that's not how to treat people".  I have omitted several expletives which I do not prefer to include in this note, but he is quite profane.

## 2013-10-24 NOTE — ED Notes (Signed)
Bed: XB14 Expected date: 10/24/13 Expected time: 5:17 PM Means of arrival: Ambulance Comments: SI attempt

## 2013-10-24 NOTE — ED Notes (Signed)
Belongings inventoried and labeled.

## 2013-10-24 NOTE — Consult Note (Signed)
[facility] Face-to-Face Psychiatry Consult   Reason for Consult:  Hallucinations Referring Physician:  EDP  [name] is an 23 y.o. male. Total Time spent with patient: 20 minutes  Assessment: AXIS I:  Schizoaffective Disorder AXIS II:  Deferred AXIS III:   Past Medical History  Diagnosis Date  . Asthma   . Bipolar 1 disorder   . ODD (oppositional defiant disorder)   . ADHD (attention deficit hyperactivity disorder)   . Schizophrenia   . Suicidal ideation   . Homicidal ideation   . Explosive personality disorder    AXIS IV:  other psychosocial or environmental problems and problems related to social environment AXIS V:  61-70 mild symptoms  Plan:  No evidence of imminent risk to self or others at present.  Dr. Dwyane [name] reviewed the patient and concurs with the plan.  Subjective:   [name] is a 23 y.o. male patient does not warrant admission.  HPI:  The patient has been in the ED daily with the same complaints.  However, when he is in the psych ED, he refuses any different medications and treatments.  Apparently, he went home last night and cut himself but per the notes, no one could find a cut.  The patient has a history of a TBI and reasoning with him is obsolete.  The medical director at [facility], Dr. Dwyane [name], set up a care plan for when the patient comes to the ED his ACT team is to be notified immediately and not assessed until they arrive.  He is then to be assessed by the EDP for discharge to his ACT team.  Lynden's baseline is hallucinations even though he does not appear to be responding to internal stimuli.  An APS report was filed yesterday since his mother is getting a check but he has been in the ED for the past month and when the ACT team goes to his house, she is unavailable.  His ACT team is suppose to start looking for a group on Monday for him since he is not able to maintain stability in his current environment.  Patient remains argumentive and demanding, again this is  baseline for him.  Discharge to his ACT team for them to take to [facility] until Monday when they can find him a group home. This Afternoon:  The patient comes to [facility] Emergency Department with a superficial scratch to his left forearm.  Nothing has changed since this morning, except he went home with his ACT team and returned after his scratch.  Lyriq continues to abuse the system.  Care plan in place and being followed; new plan will be constructed on Monday for diversion of the ED usage.  The ACT team are on their way to take him back home. HPI Elements:   Generalized, chronic, constant, weeks, unstable home environment  Past Psychiatric History: Past Medical History  Diagnosis Date  . Asthma   . Bipolar 1 disorder   . ODD (oppositional defiant disorder)   . ADHD (attention deficit hyperactivity disorder)   . Schizophrenia   . Suicidal ideation   . Homicidal ideation   . Explosive personality disorder     reports that he has never smoked. He does not have any smokeless tobacco history on file. He reports that he does not drink alcohol or use illicit drugs. No family history on file.         Allergies:   Allergies  Allergen Reactions  . Carbamazepine Anaphylaxis, Other (See Comments) and  Cough    Cough up blood  . Geodon [Ziprasidone Hcl] Anaphylaxis and Other (See Comments)    " Causes my throat to close"  . Haldol [Haloperidol Lactate] Anaphylaxis and Other (See Comments)    Patient reports that he stopped taking this because of throat swelling.   Christianne Borrow [Atomoxetine] Anaphylaxis, Other (See Comments) and Cough    Cough up blood  . Other Nausea Only and Other (See Comments)    Apple Juice, stomach hurts and itchy throat     ACT Assessment Complete:  Yes:    Educational Status    Risk to Self:    Risk to Others:    Abuse:    Prior Inpatient Therapy:    Prior Outpatient Therapy:    Additional Information:                     Objective: Blood pressure 133/70, pulse 94, temperature 99.2 F (37.3 C), temperature source Oral, resp. rate 16, SpO2 99.00%.There is no weight on file to calculate BMI. Results for orders placed during the hospital encounter of 10/23/13 (from the past 72 hour(s))  URINALYSIS, ROUTINE W REFLEX MICROSCOPIC     Status: None   Collection Time    10/23/13  4:17 PM      Result Value Ref Range   Color, Urine YELLOW  YELLOW   APPearance CLEAR  CLEAR   Specific Gravity, Urine 1.017  1.005 - 1.030   pH 6.0  5.0 - 8.0   Glucose, UA NEGATIVE  NEGATIVE mg/dL   Hgb urine dipstick NEGATIVE  NEGATIVE   Bilirubin Urine NEGATIVE  NEGATIVE   Ketones, ur NEGATIVE  NEGATIVE mg/dL   Protein, ur NEGATIVE  NEGATIVE mg/dL   Urobilinogen, UA 1.0  0.0 - 1.0 mg/dL   Nitrite NEGATIVE  NEGATIVE   Leukocytes, UA NEGATIVE  NEGATIVE   Comment: MICROSCOPIC NOT DONE ON URINES WITH NEGATIVE PROTEIN, BLOOD, LEUKOCYTES, NITRITE, OR GLUCOSE <1000 mg/dL.  URINE RAPID DRUG SCREEN (HOSP PERFORMED)     Status: None   Collection Time    10/23/13  4:17 PM      Result Value Ref Range   Opiates NONE DETECTED  NONE DETECTED   Cocaine NONE DETECTED  NONE DETECTED   Benzodiazepines NONE DETECTED  NONE DETECTED   Amphetamines NONE DETECTED  NONE DETECTED   Tetrahydrocannabinol NONE DETECTED  NONE DETECTED   Barbiturates NONE DETECTED  NONE DETECTED   Comment:            DRUG SCREEN FOR MEDICAL PURPOSES     ONLY.  IF CONFIRMATION IS NEEDED     FOR ANY PURPOSE, NOTIFY LAB     WITHIN 5 DAYS.                LOWEST DETECTABLE LIMITS     FOR URINE DRUG SCREEN     Drug Class       Cutoff (ng/mL)     Amphetamine      1000     Barbiturate      200     Benzodiazepine   124     Tricyclics       580     Opiates          300     Cocaine          300     THC              50  CBC WITH DIFFERENTIAL  Status: None   Collection Time    10/23/13  4:24 PM      Result Value Ref Range   WBC 4.5  4.0  - 10.5 K/uL   RBC 5.38  4.22 - 5.81 MIL/uL   Hemoglobin 14.3  13.0 - 17.0 g/dL   HCT 18.8  67.7 - 37.3 %   MCV 78.3  78.0 - 100.0 fL   MCH 26.6  26.0 - 34.0 pg   MCHC 34.0  30.0 - 36.0 g/dL   RDW 66.8  15.9 - 47.0 %   Platelets 242  150 - 400 K/uL   Neutrophils Relative % 64  43 - 77 %   Neutro Abs 2.8  1.7 - 7.7 K/uL   Lymphocytes Relative 24  12 - 46 %   Lymphs Abs 1.1  0.7 - 4.0 K/uL   Monocytes Relative 10  3 - 12 %   Monocytes Absolute 0.4  0.1 - 1.0 K/uL   Eosinophils Relative 2  0 - 5 %   Eosinophils Absolute 0.1  0.0 - 0.7 K/uL   Basophils Relative 0  0 - 1 %   Basophils Absolute 0.0  0.0 - 0.1 K/uL  COMPREHENSIVE METABOLIC PANEL     Status: Abnormal   Collection Time    10/23/13  4:24 PM      Result Value Ref Range   Sodium 140  137 - 147 mEq/L   Potassium 4.1  3.7 - 5.3 mEq/L   Chloride 101  96 - 112 mEq/L   CO2 25  19 - 32 mEq/L   Glucose, Bld 103 (*) 70 - 99 mg/dL   BUN 8  6 - 23 mg/dL   Creatinine, Ser 7.61  0.50 - 1.35 mg/dL   Calcium 9.7  8.4 - 51.8 mg/dL   Total Protein 8.2  6.0 - 8.3 g/dL   Albumin 4.3  3.5 - 5.2 g/dL   AST 22  0 - 37 U/L   Comment: NO VISIBLE HEMOLYSIS     HEMOLYSIS AT THIS LEVEL MAY AFFECT RESULT   ALT 16  0 - 53 U/L   Alkaline Phosphatase 73  39 - 117 U/L   Total Bilirubin 0.4  0.3 - 1.2 mg/dL   GFR calc non Af Amer >90  >90 mL/min   GFR calc Af Amer >90  >90 mL/min   Comment: (NOTE)     The eGFR has been calculated using the CKD EPI equation.     This calculation has not been validated in all clinical situations.     eGFR's persistently <90 mL/min signify possible Chronic Kidney     Disease.   Anion gap 14  5 - 15  ACETAMINOPHEN LEVEL     Status: None   Collection Time    10/23/13  4:24 PM      Result Value Ref Range   Acetaminophen (Tylenol), Serum <15.0  10 - 30 ug/mL   Comment:            THERAPEUTIC CONCENTRATIONS VARY     SIGNIFICANTLY. A RANGE OF 10-30     ug/mL MAY BE AN EFFECTIVE     CONCENTRATION FOR MANY PATIENTS.      HOWEVER, SOME ARE BEST TREATED     AT CONCENTRATIONS OUTSIDE THIS     RANGE.     ACETAMINOPHEN CONCENTRATIONS     >150 ug/mL AT 4 HOURS AFTER     INGESTION AND >50 ug/mL AT 12     HOURS AFTER  INGESTION ARE     OFTEN ASSOCIATED WITH TOXIC     REACTIONS.  SALICYLATE LEVEL     Status: Abnormal   Collection Time    10/23/13  4:24 PM      Result Value Ref Range   Salicylate Lvl <2.0 (*) 2.8 - 20.0 mg/dL  ETHANOL     Status: None   Collection Time    10/23/13  4:24 PM      Result Value Ref Range   Alcohol, Ethyl (B) <11  0 - 11 mg/dL   Comment:            LOWEST DETECTABLE LIMIT FOR     SERUM ALCOHOL IS 11 mg/dL     FOR MEDICAL PURPOSES ONLY   Labs are reviewed and are pertinent for no medical issues noted.  No current facility-administered medications for this encounter.   Current Outpatient Prescriptions  Medication Sig Dispense Refill  . albuterol (PROVENTIL HFA;VENTOLIN HFA) 108 (90 BASE) MCG/ACT inhaler Inhale 1 puff into the lungs every 6 (six) hours as needed for wheezing or shortness of breath.      . ARIPiprazole (ABILIFY MAINTENA) 400 MG SUSR Inject 400 mg into the muscle every 30 (thirty) days.      . ARIPiprazole (ABILIFY) 10 MG tablet Take 1 tablet (10 mg total) by mouth daily.  14 tablet  0  . benztropine (COGENTIN) 1 MG tablet Take 1 tablet (1 mg total) by mouth 2 (two) times daily.  60 tablet  0  . chlorproMAZINE (THORAZINE) 100 MG tablet Take 100 mg by mouth 2 (two) times daily.      . divalproex (DEPAKOTE ER) 500 MG 24 hr tablet Take 500 mg by mouth 2 (two) times daily.      . traZODone (DESYREL) 50 MG tablet Take 50 mg by mouth at bedtime as needed for sleep.       Psychiatric Specialty Exam:     Blood pressure 131/71, pulse 85, temperature 97.9 F (36.6 C), temperature source Oral, resp. rate 18, SpO2 100.00%.There is no weight on file to calculate BMI.  General Appearance: Casual  Eye Contact::  Good  Speech:  Normal Rate  Volume:  Normal  Mood:   Irritable  Affect:  Congruent  Thought Process:  Coherent  Orientation:  Full (Time, Place, and Person)  Thought Content:  Auditory and visual hallucinations  Suicidal Thoughts:  No  Homicidal Thoughts:  No  Memory:  Immediate;   Good Recent;   Good Remote;   Good  Judgement:  Fair  Insight:  Fair  Psychomotor Activity:  Normal  Concentration:  Good  Recall:  Good  Fund of Knowledge:Fair  Language: Fair  Akathisia:  No  Handed:  Right  AIMS (if indicated):     Assets:  Communication Skills Desire for Improvement Financial Resources/Insurance Housing Leisure Time Physical Health Resilience Social Support Transportation  Sleep:       Musculoskeletal: Strength & Muscle Tone: within normal limits Gait & Station: normal Patient leans: N/A  Treatment Plan Summary: Discharge home with his ACT team who will manage his care; Dr. Dwyane [name] reviewed, consulted, and concurs with the plan.  Dr. Zenia Resides, EDP, has also reviewed the patient  Waylan Boga, [place] 10/24/2013 5:55 PM

## 2013-10-24 NOTE — ED Notes (Signed)
Patient upset about being discharged, states he will just throw himself in front of a car, states he doesn't understand why he is leaving if he still suicidal.

## 2013-10-24 NOTE — ED Notes (Signed)
Snack and drink given 

## 2013-10-24 NOTE — ED Notes (Signed)
Spoke with Fayrene Fearing from ACT team, en route to pick patient up and transport to [place].

## 2013-10-24 NOTE — Consult Note (Signed)
Case discussed, agree with plan. Patient can be discharged to his ACT Team

## 2013-10-24 NOTE — ED Provider Notes (Signed)
  Clinical information given to me by n. Pickering MD.  Pt with 9th ER visit in 17 days.  Discussed with [facility] staff.  Pt under IVF from Mother (not present, or available by phone currently).  After PA eval, and consult with Psychiatrist, recommendation has been made that the patient be taken by his TTS team to [facility] where he will be held until Monday morning. Then an attempt will be made for resident treatment facility.Marland Kitchen  He is to be transferred by his community ACT team.  Rolland Porter, MD 10/24/13 [phone]

## 2013-10-24 NOTE — ED Provider Notes (Signed)
CSN: [id number]     Arrival date & time 10/24/13  0304 History   First MD Initiated Contact with Patient 10/24/13 0557     Chief Complaint  Patient presents with  . Hallucinations     (Consider location/radiation/quality/duration/timing/severity/associated sxs/prior Treatment) The history is provided by the patient.   patient presents with his eighth visit in 7 days. He states his hearing voices. He was brought in under involuntary commitment from family members and police. Reportedly had been walking into traffic. Patient states he has pain in his left wrist from when he fell 3 days ago. He's been in ER 4 times since then but states no x-rays were done. He states he continues to hear voices. He states he has wanted to cut, but there is no evidence of acute cutting. He states he had a cut on his wrist this was hurting so much. He does not appear to be new lacerations.   Past Medical History  Diagnosis Date  . Asthma   . Bipolar 1 disorder   . ODD (oppositional defiant disorder)   . ADHD (attention deficit hyperactivity disorder)   . Schizophrenia   . Suicidal ideation   . Homicidal ideation   . Explosive personality disorder    History reviewed. No pertinent past surgical history. No family history on file. History  Substance Use Topics  . Smoking status: Never Smoker   . Smokeless tobacco: Not on file  . Alcohol Use: No     Comment: Patient denies    Review of Systems  Constitutional: Negative for diaphoresis.  Respiratory: Negative for shortness of breath.   Cardiovascular: Negative for chest pain.  Gastrointestinal: Negative for abdominal pain.  Musculoskeletal: Negative for myalgias and neck pain.  Neurological: Negative for light-headedness.  Psychiatric/Behavioral: Positive for suicidal ideas and hallucinations.      Allergies  Carbamazepine; Geodon; Haldol; Strattera; and Other  Home Medications   Prior to Admission medications   Medication Sig Start Date  End Date Taking? Authorizing Provider  albuterol (PROVENTIL HFA;VENTOLIN HFA) 108 (90 BASE) MCG/ACT inhaler Inhale 1 puff into the lungs every 6 (six) hours as needed for wheezing or shortness of breath.   Yes Historical Provider, MD  ARIPiprazole (ABILIFY MAINTENA) 400 MG SUSR Inject 400 mg into the muscle every 30 (thirty) days.   Yes Historical Provider, MD  ARIPiprazole (ABILIFY) 10 MG tablet Take 1 tablet (10 mg total) by mouth daily. 10/18/13  Yes Shuvon Rankin, NP  benztropine (COGENTIN) 1 MG tablet Take 1 tablet (1 mg total) by mouth 2 (two) times daily. 10/13/13  Yes Beau Fanny, FNP  chlorproMAZINE (THORAZINE) 100 MG tablet Take 100 mg by mouth 2 (two) times daily.   Yes Historical Provider, MD  divalproex (DEPAKOTE ER) 500 MG 24 hr tablet Take 500 mg by mouth 2 (two) times daily.   Yes Historical Provider, MD  traZODone (DESYREL) 50 MG tablet Take 50 mg by mouth at bedtime as needed for sleep.   Yes Historical Provider, MD   BP 136/80  Pulse 88  Temp(Src) 98.2 F (36.8 C) (Oral)  Resp 18  Ht  (1.778 m)  Wt 270 lb (122.471 kg)  BMI 38.74 kg/m2  SpO2 98% Physical Exam  Nursing note and vitals reviewed. Constitutional: He is oriented to person, place, and time. He appears well-developed and well-nourished.  HENT:  Head: Normocephalic and atraumatic.  Eyes: EOM are normal. Pupils are equal, round, and reactive to light.  Neck: Normal range of motion. Neck  supple.  Cardiovascular: Normal rate, regular rhythm and normal heart sounds.   No murmur heard. Pulmonary/Chest: Effort normal and breath sounds normal.  Abdominal: Soft. Bowel sounds are normal. He exhibits no distension and no mass. There is no tenderness. There is no rebound and no guarding.  Musculoskeletal: Normal range of motion. He exhibits tenderness. He exhibits no edema.  Mild tenderness over left wrist. No tenderness over snuff box. Range of motion intact. Neurovascular intact in hand.  Neurological: He is  alert and oriented to person, place, and time. No cranial nerve deficit.  Skin: Skin is warm and dry.  Psychiatric:  Patient has a somewhat flat affect. Does not appear to be responding to internal stimuli.    ED Course  Procedures (including critical care time) Labs Review Labs Reviewed - No data to display  Imaging Review No results found.   EKG Interpretation None      MDM   Final diagnoses:  None   Patient with history of schizophrenia. Multiple visits to the ER. States that he was here because his wrist was hurting. Negative x-ray. I was later formed is brought in under involuntary commitment. Note states that he had been walking in traffic and responding to internal stimuli. Will get seen by TTS.    Juliet Rude. Rubin Payor, MD 10/24/13 [phone]

## 2013-10-24 NOTE — ED Notes (Signed)
Pt brought to ED via [employer] - per [employer] patients mother states pt was making suicidal statements, upon assessment pt is denying SI/HI at this time and also denies auditory/visual hallucinations. Mobile Crisis line notified.

## 2013-10-24 NOTE — ED Notes (Signed)
Patient to have another TTS assessment per Dr. Fayrene Fearing

## 2013-10-24 NOTE — Discharge Instructions (Signed)
Please call your doctor for a followup appointment within 24-48 hours. When you talk to your doctor please let them know that you were seen in the emergency department and have them acquire all of your records so that they can discuss the findings with you and formulate a treatment plan to fully care for your new and ongoing problems. ° °

## 2013-10-24 NOTE — ED Notes (Signed)
Pt placed in paper scrubs, security wanded pt, belongings bagged and labeled and placed at nurses station. NT at bedside as sitter.

## 2013-10-24 NOTE — Discharge Instructions (Signed)
Schizoaffective Disorder Schizoaffective disorder (ScAD) is a mental illness. It causes symptoms that are a mixture of schizophrenia (a psychotic disorder) and an affective (mood) disorder. The schizophrenic symptoms may include delusions, hallucinations, or odd behavior. The mood symptoms may be similar to major depression or bipolar disorder. ScAD may interfere with personal relationships or normal daily activities. People with ScAD are at increased risk for job loss, social isolation,physical health problems, anxiety and substance use disorders, and suicide. ScAD usually occurs in cycles. Periods of severe symptoms are followed by periods of less severe symptoms or improvement. The illness affects men and women equally but usually appears at an earlier age (teenage or early adult years) in men. People who have family members with schizophrenia, bipolar disorder, or ScAD are at higher risk of developing ScAD. SYMPTOMS  At any one time, people with ScAD may have psychotic symptoms only or both psychotic and mood symptoms. The psychotic symptoms include one or more of the following:  Hearing, seeing, or feeling things that are not there (hallucinations).   Having fixed, false beliefs (delusions). The delusions usually are of being attacked, harassed, cheated, persecuted, or conspired against (paranoid delusions).  Speaking in a way that makes no sense to others (disorganized speech). The psychotic symptoms of ScAD may also include confusing or odd behavior or any of the negative symptoms of schizophrenia. These include loss of motivation for normal daily activities, such as bathing or grooming, withdrawal from other people, and lack of emotions.  The mood symptoms of ScAD occur more often than not. They resemble major depressive disorder or bipolar mania. Symptoms of major depression include depressed mood and four or more of the following:  Loss of interest in usually pleasurable activities  (anhedonia).  Sleeping more or less than normal.  Feeling worthless or excessively guilty.  Lack of energy or motivation.  Trouble concentrating.  Eating more or less than usual.  Thinking a lot about death or suicide. Symptoms of bipolar mania include abnormally elevated or irritable mood and increased energy or activity, plus three or more of the following:   More confidence than normal or feeling that you are able to do anything (grandiosity).  Feeling rested with less sleep than normal.   Being easily distracted.   Talking more than usual or feeling pressured to keep talking.   Feeling that your thoughts are racing.  Engaging in high-risk activities such as buying sprees or foolish business decisions. DIAGNOSIS  ScAD is diagnosed through an assessment by your health care provider. Your health care provider will observe and ask questions about your thoughts, behavior, mood, and ability to function in daily life. Your health care provider may also ask questions about your medical history and use of drugs, including prescription medicines. Your health care provider may also order blood tests and imaging exams. Certain medical conditions and substances can cause symptoms that resemble ScAD. Your health care provider may refer you to a mental health specialist for evaluation.  ScAD is divided into two types. The depressive type is diagnosed if your mood symptoms are limited to major depression. The bipolar type is diagnosed if your mood symptoms are manic or a mixture of manic and depressive symptoms TREATMENT  ScAD is usually a lifelong illness. Long-term treatment is necessary. The following treatments are available:  Medicine. Different types of medicine are used to treat ScAD. The exact combination depends on the type and severity of your symptoms. Antipsychotic medicine is used to control psychotic symptomssuch as delusions, paranoia,   and hallucinations. Mood stabilizers can  even the highs and lows of bipolar manic mood swings. Antidepressant medicines are used to treat major depressive symptoms.  Counseling or talk therapy. Individual, group, or family counseling may be helpful in providing education, support, and guidance. Many people with ScAD also benefit from social skills and job skills (vocational) training. A combination of medicine and counseling is usually best for managing the disorder over time. A procedure in which electricity is applied to the brain through the scalp (electroconvulsive therapy) may be used to treat people with severe manic symptoms that do not respond to medicine and counseling. HOME CARE INSTRUCTIONS   Take all your medicine as prescribed.  Check with your health care provider before starting new prescription or over-the-counter medicines.  Keep all follow up appointments with your health care provider. SEEK MEDICAL CARE IF:   If you are not able to take your medicines as prescribed.  If your symptoms get worse. SEEK IMMEDIATE MEDICAL CARE IF:   You have serious thoughts about hurting yourself or others. Document Released: 06/25/2006 Document Revised: 06/29/2013 Document Reviewed: 09/26/2012 Exit[employer] Patient Information 2015 [employer]. This information is not intended to replace advice given to you by your health care provider. Make sure you discuss any questions you have with your health care provider.  

## 2013-10-24 NOTE — ED Notes (Signed)
TTS machine at bedside. 

## 2013-10-24 NOTE — Consult Note (Signed)
[facility] Face-to-Face Psychiatry Consult   Reason for Consult:  Hallucinations Referring Physician:  EDP  [name] is an 23 y.o. male. Total Time spent with patient: 20 minutes  Assessment: AXIS I:  Schizoaffective Disorder AXIS II:  Deferred AXIS III:   Past Medical History  Diagnosis Date  . Asthma   . Bipolar 1 disorder   . ODD (oppositional defiant disorder)   . ADHD (attention deficit hyperactivity disorder)   . Schizophrenia   . Suicidal ideation   . Homicidal ideation   . Explosive personality disorder    AXIS IV:  other psychosocial or environmental problems and problems related to social environment AXIS V:  61-70 mild symptoms  Plan:  No evidence of imminent risk to self or others at present.  Dr. Dwyane [name] reviewed the patient and concurs with the plan.  Subjective:   [name] is a 23 y.o. male patient does not warrant admission.  HPI:  The patient has been in the ED daily with the same complaints.  However, when he is in the psych ED, he refuses any different medications and treatments.  Apparently, he went home last night and cut himself but per the notes, no one could find a cut.  The patient has a history of a TBI and reasoning with him is obsolete.  The medical director at [facility], Dr. Dwyane [name], set up a care plan for when the patient comes to the ED his ACT team is to be notified immediately and not assessed until they arrive.  He is then to be assessed by the EDP for discharge to his ACT team.  Kamarian's baseline is hallucinations even though he does not appear to be responding to internal stimuli.  An APS report was filed yesterday since his mother is getting a check but he has been in the ED for the past month and when the ACT team goes to his house, she is unavailable.  His ACT team is suppose to start looking for a group on Monday for him since he is not able to maintain stability in his current environment.  Patient remains argumentive and demanding, again this is  baseline for him.  Discharge to his ACT team for them to take to [facility] until Monday when they can find him a group home. HPI Elements:   Generalized, chronic, constant, weeks, unstable home environment  Past Psychiatric History: Past Medical History  Diagnosis Date  . Asthma   . Bipolar 1 disorder   . ODD (oppositional defiant disorder)   . ADHD (attention deficit hyperactivity disorder)   . Schizophrenia   . Suicidal ideation   . Homicidal ideation   . Explosive personality disorder     reports that he has never smoked. He does not have any smokeless tobacco history on file. He reports that he does not drink alcohol or use illicit drugs. No family history on file.         Allergies:   Allergies  Allergen Reactions  . Carbamazepine Anaphylaxis, Other (See Comments) and Cough    Cough up blood  . Geodon [Ziprasidone Hcl] Anaphylaxis and Other (See Comments)    " Causes my throat to close"  . Haldol [Haloperidol Lactate] Anaphylaxis and Other (See Comments)    Patient reports that he stopped taking this because of throat swelling.   Christianne Borrow [Atomoxetine] Anaphylaxis, Other (See Comments) and Cough    Cough up blood  . Other Nausea Only and Other (See Comments)  Apple Juice, stomach hurts and itchy throat     ACT Assessment Complete:  Yes:    Educational Status    Risk to Self: Risk to self with the past 6 months Is patient at risk for suicide?: Yes Substance abuse history and/or treatment for substance abuse?: No  Risk to Others:    Abuse:    Prior Inpatient Therapy:    Prior Outpatient Therapy:    Additional Information:                    Objective: Blood pressure 136/80, pulse 88, temperature 98.2 F (36.8 C), temperature source Oral, resp. rate 18, height 5\' 10"  (1.778 m), weight 270 lb (122.471 kg), SpO2 98.00%.Body mass index is 38.74 kg/(m^2). Results for orders placed during the hospital encounter of 10/23/13 (from the  past 72 hour(s))  URINALYSIS, ROUTINE W REFLEX MICROSCOPIC     Status: None   Collection Time    10/23/13  4:17 PM      Result Value Ref Range   Color, Urine YELLOW  YELLOW   APPearance CLEAR  CLEAR   Specific Gravity, Urine 1.017  1.005 - 1.030   pH 6.0  5.0 - 8.0   Glucose, UA NEGATIVE  NEGATIVE mg/dL   Hgb urine dipstick NEGATIVE  NEGATIVE   Bilirubin Urine NEGATIVE  NEGATIVE   Ketones, ur NEGATIVE  NEGATIVE mg/dL   Protein, ur NEGATIVE  NEGATIVE mg/dL   Urobilinogen, UA 1.0  0.0 - 1.0 mg/dL   Nitrite NEGATIVE  NEGATIVE   Leukocytes, UA NEGATIVE  NEGATIVE   Comment: MICROSCOPIC NOT DONE ON URINES WITH NEGATIVE PROTEIN, BLOOD, LEUKOCYTES, NITRITE, OR GLUCOSE <1000 mg/dL.  URINE RAPID DRUG SCREEN (HOSP PERFORMED)     Status: None   Collection Time    10/23/13  4:17 PM      Result Value Ref Range   Opiates NONE DETECTED  NONE DETECTED   Cocaine NONE DETECTED  NONE DETECTED   Benzodiazepines NONE DETECTED  NONE DETECTED   Amphetamines NONE DETECTED  NONE DETECTED   Tetrahydrocannabinol NONE DETECTED  NONE DETECTED   Barbiturates NONE DETECTED  NONE DETECTED   Comment:            DRUG SCREEN FOR MEDICAL PURPOSES     ONLY.  IF CONFIRMATION IS NEEDED     FOR ANY PURPOSE, NOTIFY LAB     WITHIN 5 DAYS.                LOWEST DETECTABLE LIMITS     FOR URINE DRUG SCREEN     Drug Class       Cutoff (ng/mL)     Amphetamine      1000     Barbiturate      200     Benzodiazepine   200     Tricyclics       300     Opiates          300     Cocaine          300     THC              50  CBC WITH DIFFERENTIAL     Status: None   Collection Time    10/23/13  4:24 PM      Result Value Ref Range   WBC 4.5  4.0 - 10.5 K/uL   RBC 5.38  4.22 - 5.81 MIL/uL   Hemoglobin 14.3  13.0 -  17.0 g/dL   HCT 42.1  39.0 - 52.0 %   MCV 78.3  78.0 - 100.0 fL   MCH 26.6  26.0 - 34.0 pg   MCHC 34.0  30.0 - 36.0 g/dL   RDW 12.9  11.5 - 15.5 %   Platelets 242  150 - 400 K/uL   Neutrophils Relative % 64   43 - 77 %   Neutro Abs 2.8  1.7 - 7.7 K/uL   Lymphocytes Relative 24  12 - 46 %   Lymphs Abs 1.1  0.7 - 4.0 K/uL   Monocytes Relative 10  3 - 12 %   Monocytes Absolute 0.4  0.1 - 1.0 K/uL   Eosinophils Relative 2  0 - 5 %   Eosinophils Absolute 0.1  0.0 - 0.7 K/uL   Basophils Relative 0  0 - 1 %   Basophils Absolute 0.0  0.0 - 0.1 K/uL  COMPREHENSIVE METABOLIC PANEL     Status: Abnormal   Collection Time    10/23/13  4:24 PM      Result Value Ref Range   Sodium 140  137 - 147 mEq/L   Potassium 4.1  3.7 - 5.3 mEq/L   Chloride 101  96 - 112 mEq/L   CO2 25  19 - 32 mEq/L   Glucose, Bld 103 (*) 70 - 99 mg/dL   BUN 8  6 - 23 mg/dL   Creatinine, Ser 0.89  0.50 - 1.35 mg/dL   Calcium 9.7  8.4 - 10.5 mg/dL   Total Protein 8.2  6.0 - 8.3 g/dL   Albumin 4.3  3.5 - 5.2 g/dL   AST 22  0 - 37 U/L   Comment: NO VISIBLE HEMOLYSIS     HEMOLYSIS AT THIS LEVEL MAY AFFECT RESULT   ALT 16  0 - 53 U/L   Alkaline Phosphatase 73  39 - 117 U/L   Total Bilirubin 0.4  0.3 - 1.2 mg/dL   GFR calc non Af Amer >90  >90 mL/min   GFR calc Af Amer >90  >90 mL/min   Comment: (NOTE)     The eGFR has been calculated using the CKD EPI equation.     This calculation has not been validated in all clinical situations.     eGFR's persistently <90 mL/min signify possible Chronic Kidney     Disease.   Anion gap 14  5 - 15  ACETAMINOPHEN LEVEL     Status: None   Collection Time    10/23/13  4:24 PM      Result Value Ref Range   Acetaminophen (Tylenol), Serum <15.0  10 - 30 ug/mL   Comment:            THERAPEUTIC CONCENTRATIONS VARY     SIGNIFICANTLY. A RANGE OF 10-30     ug/mL MAY BE AN EFFECTIVE     CONCENTRATION FOR MANY PATIENTS.     HOWEVER, SOME ARE BEST TREATED     AT CONCENTRATIONS OUTSIDE THIS     RANGE.     ACETAMINOPHEN CONCENTRATIONS     >150 ug/mL AT 4 HOURS AFTER     INGESTION AND >50 ug/mL AT 12     HOURS AFTER INGESTION ARE     OFTEN ASSOCIATED WITH TOXIC     REACTIONS.  SALICYLATE LEVEL      Status: Abnormal   Collection Time    10/23/13  4:24 PM      Result Value Ref Range   Salicylate  Lvl <2.0 (*) 2.8 - 20.0 mg/dL  ETHANOL     Status: None   Collection Time    10/23/13  4:24 PM      Result Value Ref Range   Alcohol, Ethyl (B) <11  0 - 11 mg/dL   Comment:            LOWEST DETECTABLE LIMIT FOR     SERUM ALCOHOL IS 11 mg/dL     FOR MEDICAL PURPOSES ONLY   Labs are reviewed and are pertinent for no medical issues noted.  Current Facility-Administered Medications  Medication Dose Route Frequency Provider Last Rate Last Dose  . albuterol (PROVENTIL HFA;VENTOLIN HFA) 108 (90 BASE) MCG/ACT inhaler 1 puff  1 puff Inhalation Q6H PRN Jasper Riling. Pickering, MD      . ARIPiprazole (ABILIFY) tablet 10 mg  10 mg Oral Daily Jasper Riling. Pickering, MD      . benztropine (COGENTIN) tablet 1 mg  1 mg Oral BID Jasper Riling. Pickering, MD      . chlorproMAZINE (THORAZINE) tablet 100 mg  100 mg Oral BID Jasper Riling. Pickering, MD      . divalproex (DEPAKOTE ER) 24 hr tablet 500 mg  500 mg Oral BID Jasper Riling. Pickering, MD      . traZODone (DESYREL) tablet 50 mg  50 mg Oral QHS PRN Jasper Riling. Alvino Chapel, MD       Current Outpatient Prescriptions  Medication Sig Dispense Refill  . albuterol (PROVENTIL HFA;VENTOLIN HFA) 108 (90 BASE) MCG/ACT inhaler Inhale 1 puff into the lungs every 6 (six) hours as needed for wheezing or shortness of breath.      . ARIPiprazole (ABILIFY MAINTENA) 400 MG SUSR Inject 400 mg into the muscle every 30 (thirty) days.      . ARIPiprazole (ABILIFY) 10 MG tablet Take 1 tablet (10 mg total) by mouth daily.  14 tablet  0  . benztropine (COGENTIN) 1 MG tablet Take 1 tablet (1 mg total) by mouth 2 (two) times daily.  60 tablet  0  . chlorproMAZINE (THORAZINE) 100 MG tablet Take 100 mg by mouth 2 (two) times daily.      . divalproex (DEPAKOTE ER) 500 MG 24 hr tablet Take 500 mg by mouth 2 (two) times daily.      . traZODone (DESYREL) 50 MG tablet Take 50 mg by mouth at bedtime as  needed for sleep.       Psychiatric Specialty Exam:     Blood pressure 131/71, pulse 85, temperature 97.9 F (36.6 C), temperature source Oral, resp. rate 18, SpO2 100.00%.There is no weight on file to calculate BMI.  General Appearance: Casual  Eye Contact::  Good  Speech:  Normal Rate  Volume:  Normal  Mood:  Irritable  Affect:  Congruent  Thought Process:  Coherent  Orientation:  Full (Time, Place, and Person)  Thought Content:  Auditory and visual hallucinations  Suicidal Thoughts:  No  Homicidal Thoughts:  No  Memory:  Immediate;   Good Recent;   Good Remote;   Good  Judgement:  Fair  Insight:  Fair  Psychomotor Activity:  Normal  Concentration:  Good  Recall:  Good  Fund of Knowledge:Fair  Language: Fair  Akathisia:  No  Handed:  Right  AIMS (if indicated):     Assets:  Communication Skills Desire for Improvement Financial Resources/Insurance Housing Leisure Time Physical Health Resilience Social Support Transportation  Sleep:       Musculoskeletal: Strength & Muscle Tone: within normal limits  Gait & Station: normal Patient leans: N/A  Treatment Plan Summary: Discharge home with his ACT team who will manage his care; Dr. Dwyane [name] reviewed, consulted, and concurs with the plan.  Waylan Boga, [place] 10/24/2013 9:03 AM

## 2013-10-24 NOTE — ED Provider Notes (Signed)
CSN: [id number]     Arrival date & time 10/24/13  1719 History   First MD Initiated Contact with Patient 10/24/13 1738     Chief Complaint  Patient presents with  . Open Wound     (Consider location/radiation/quality/duration/timing/severity/associated sxs/prior Treatment) HPI Comments: Patient here with self-inflicted injury to his left forearm. Has a superficial laceration to the forearm without in vault. The patient stated multiple times with suicidal ideations. Patient was discharged this morning and said to [facility] with a plan for him to stay until Monday for placement. Patient left shortly after arriving then went home and perform a self inflicted wound to his forearm. States the voices told to do this. I spoke with the psychiatric APP and reviewed the patient's care plan. Patient's symptoms have been escalating nothing makes them better.  The history is provided by the patient. The history is limited by the condition of the patient.    Past Medical History  Diagnosis Date  . Asthma   . Bipolar 1 disorder   . ODD (oppositional defiant disorder)   . ADHD (attention deficit hyperactivity disorder)   . Schizophrenia   . Suicidal ideation   . Homicidal ideation   . Explosive personality disorder    No past surgical history on file. No family history on file. History  Substance Use Topics  . Smoking status: Never Smoker   . Smokeless tobacco: Not on file  . Alcohol Use: No     Comment: Patient denies    Review of Systems  Unable to perform ROS     Allergies  Carbamazepine; Geodon; Haldol; Strattera; and Other  Home Medications   Prior to Admission medications   Medication Sig Start Date End Date Taking? Authorizing Provider  albuterol (PROVENTIL HFA;VENTOLIN HFA) 108 (90 BASE) MCG/ACT inhaler Inhale 1 puff into the lungs every 6 (six) hours as needed for wheezing or shortness of breath.   Yes Historical Provider, MD  ARIPiprazole (ABILIFY MAINTENA) 400 MG SUSR  Inject 400 mg into the muscle every 30 (thirty) days.   Yes Historical Provider, MD  ARIPiprazole (ABILIFY) 10 MG tablet Take 1 tablet (10 mg total) by mouth daily. 10/18/13  Yes Shuvon Rankin, NP  benztropine (COGENTIN) 1 MG tablet Take 1 tablet (1 mg total) by mouth 2 (two) times daily. 10/13/13  Yes Beau Fanny, FNP  chlorproMAZINE (THORAZINE) 100 MG tablet Take 100 mg by mouth 2 (two) times daily.   Yes Historical Provider, MD  divalproex (DEPAKOTE ER) 500 MG 24 hr tablet Take 500 mg by mouth 2 (two) times daily.   Yes Historical Provider, MD  traZODone (DESYREL) 50 MG tablet Take 50 mg by mouth at bedtime as needed for sleep.   Yes Historical Provider, MD   BP 133/70  Pulse 94  Temp(Src) 99.2 F (37.3 C) (Oral)  Resp 16  SpO2 99% Physical Exam  Nursing note and vitals reviewed. Constitutional: He is oriented to person, place, and time. He appears well-developed and well-nourished.  Non-toxic appearance. No distress.  HENT:  Head: Normocephalic and atraumatic.  Eyes: Conjunctivae, EOM and lids are normal. Pupils are equal, round, and reactive to light.  Neck: Normal range of motion. Neck supple. No tracheal deviation present. No mass present.  Cardiovascular: Normal rate, regular rhythm and normal heart sounds.  Exam reveals no gallop.   No murmur heard. Pulmonary/Chest: Effort normal and breath sounds normal. No stridor. No respiratory distress. He has no decreased breath sounds. He has no wheezes. He has  no rhonchi. He has no rales.  Abdominal: Soft. Normal appearance and bowel sounds are normal. He exhibits no distension. There is no tenderness. There is no rebound and no CVA tenderness.  Musculoskeletal: Normal range of motion. He exhibits no edema and no tenderness.       Arms: Neurological: He is alert and oriented to person, place, and time. He has normal strength. No cranial nerve deficit or sensory deficit. GCS eye subscore is 4. GCS verbal subscore is 5. GCS motor subscore is  6.  Skin: Skin is warm and dry. No abrasion and no rash noted.  Psychiatric: His mood appears anxious. His speech is rapid and/or pressured. He is agitated and aggressive. He expresses suicidal ideation. He expresses suicidal plans.    ED Course  Procedures (including critical care time) Labs Review Labs Reviewed - No data to display  Imaging Review Dg Wrist Complete Left  10/24/2013   CLINICAL DATA:  Larey Seat 3 days ago.  Left wrist pain.  EXAM: LEFT WRIST - COMPLETE 3+ VIEW  COMPARISON:  None.  FINDINGS: There is no evidence of fracture or dislocation. There is no evidence of arthropathy or other focal bone abnormality. Soft tissues are unremarkable.  IMPRESSION: Negative.   Electronically Signed   By: Burman Nieves M.D.   On: 10/24/2013 06:46     EKG Interpretation None      MDM   Final diagnoses:  Schizoaffective disorder, unspecified type   Patient to be transferred to the SAPU and will be pending placement    Toy Baker, MD 10/24/13 [phone]

## 2013-10-24 NOTE — ED Notes (Signed)
Report received from Isaac Laud RN. Pt. Alert and oriented in no distress denies SI . Pt. States he is hearing voices telling him to "hurt someone". Will continue to monitor for safety. Pt. Instructed to come to me with problems or concerns. Q 15 minute checks continue.

## 2013-10-24 NOTE — ED Notes (Addendum)
Clinician consulted with Narda Amber and Lorre Nick, MD to determine plan of disposition. Per NP, patient does not meet inpatient criteria at this time as he was evaluated this morning at [facility]. CSW contacted [employer] team member Fayrene Fearing [phone])  who states that he will pick patient up and transport him home upon discharge.   Janann Colonel. MSW, LCSW Therapeutic Triage Services-Triage Specialist   Phone: [phone] Fax: [phone]

## 2013-10-25 ENCOUNTER — Emergency Department (HOSPITAL_COMMUNITY)
Admission: EM | Admit: 2013-10-25 | Discharge: 2013-10-25 | Discharge status: Against Medical Advice | Payer: Medicaid Other | Source: Home / Self Care

## 2013-10-25 ENCOUNTER — Emergency Department (HOSPITAL_COMMUNITY)
Admission: EM | Admit: 2013-10-25 | Discharge: 2013-10-26 | Disposition: A | Discharge status: Home / Self Care | Payer: Medicaid Other | Source: Home / Self Care | Attending: Emergency Medicine | Admitting: Emergency Medicine

## 2013-10-25 ENCOUNTER — Encounter (HOSPITAL_COMMUNITY): Payer: Self-pay | Admitting: Emergency Medicine

## 2013-10-25 ENCOUNTER — Encounter (HOSPITAL_COMMUNITY): Payer: Self-pay | Admitting: Registered Nurse

## 2013-10-25 DIAGNOSIS — F411 Generalized anxiety disorder: Secondary | ICD-10-CM

## 2013-10-25 DIAGNOSIS — F209 Schizophrenia, unspecified: Secondary | ICD-10-CM | POA: Insufficient documentation

## 2013-10-25 DIAGNOSIS — F319 Bipolar disorder, unspecified: Secondary | ICD-10-CM | POA: Insufficient documentation

## 2013-10-25 DIAGNOSIS — R45851 Suicidal ideations: Secondary | ICD-10-CM

## 2013-10-25 DIAGNOSIS — Z79899 Other long term (current) drug therapy: Secondary | ICD-10-CM

## 2013-10-25 DIAGNOSIS — J45909 Unspecified asthma, uncomplicated: Secondary | ICD-10-CM

## 2013-10-25 DIAGNOSIS — R443 Hallucinations, unspecified: Secondary | ICD-10-CM

## 2013-10-25 DIAGNOSIS — R45850 Homicidal ideations: Secondary | ICD-10-CM

## 2013-10-25 NOTE — ED Notes (Signed)
Per pt sts he has been feelling anxious today. Denies HI/SI. sts he keeps having bad dreams about his past.

## 2013-10-25 NOTE — Discharge Instructions (Signed)
Schizoaffective Disorder Schizoaffective disorder (ScAD) is a mental illness. It causes symptoms that are a mixture of schizophrenia (a psychotic disorder) and an affective (mood) disorder. The schizophrenic symptoms may include delusions, hallucinations, or odd behavior. The mood symptoms may be similar to major depression or bipolar disorder. ScAD may interfere with personal relationships or normal daily activities. People with ScAD are at increased risk for job loss, social isolation,physical health problems, anxiety and substance use disorders, and suicide. ScAD usually occurs in cycles. Periods of severe symptoms are followed by periods of less severe symptoms or improvement. The illness affects men and women equally but usually appears at an earlier age (teenage or early adult years) in men. People who have family members with schizophrenia, bipolar disorder, or ScAD are at higher risk of developing ScAD. SYMPTOMS  At any one time, people with ScAD may have psychotic symptoms only or both psychotic and mood symptoms. The psychotic symptoms include one or more of the following:  Hearing, seeing, or feeling things that are not there (hallucinations).   Having fixed, false beliefs (delusions). The delusions usually are of being attacked, harassed, cheated, persecuted, or conspired against (paranoid delusions).  Speaking in a way that makes no sense to others (disorganized speech). The psychotic symptoms of ScAD may also include confusing or odd behavior or any of the negative symptoms of schizophrenia. These include loss of motivation for normal daily activities, such as bathing or grooming, withdrawal from other people, and lack of emotions.  The mood symptoms of ScAD occur more often than not. They resemble major depressive disorder or bipolar mania. Symptoms of major depression include depressed mood and four or more of the following:  Loss of interest in usually pleasurable activities  (anhedonia).  Sleeping more or less than normal.  Feeling worthless or excessively guilty.  Lack of energy or motivation.  Trouble concentrating.  Eating more or less than usual.  Thinking a lot about death or suicide. Symptoms of bipolar mania include abnormally elevated or irritable mood and increased energy or activity, plus three or more of the following:   More confidence than normal or feeling that you are able to do anything (grandiosity).  Feeling rested with less sleep than normal.   Being easily distracted.   Talking more than usual or feeling pressured to keep talking.   Feeling that your thoughts are racing.  Engaging in high-risk activities such as buying sprees or foolish business decisions. DIAGNOSIS  ScAD is diagnosed through an assessment by your health care provider. Your health care provider will observe and ask questions about your thoughts, behavior, mood, and ability to function in daily life. Your health care provider may also ask questions about your medical history and use of drugs, including prescription medicines. Your health care provider may also order blood tests and imaging exams. Certain medical conditions and substances can cause symptoms that resemble ScAD. Your health care provider may refer you to a mental health specialist for evaluation.  ScAD is divided into two types. The depressive type is diagnosed if your mood symptoms are limited to major depression. The bipolar type is diagnosed if your mood symptoms are manic or a mixture of manic and depressive symptoms TREATMENT  ScAD is usually a lifelong illness. Long-term treatment is necessary. The following treatments are available:  Medicine. Different types of medicine are used to treat ScAD. The exact combination depends on the type and severity of your symptoms. Antipsychotic medicine is used to control psychotic symptomssuch as delusions, paranoia,   and hallucinations. Mood stabilizers can  even the highs and lows of bipolar manic mood swings. Antidepressant medicines are used to treat major depressive symptoms.  Counseling or talk therapy. Individual, group, or family counseling may be helpful in providing education, support, and guidance. Many people with ScAD also benefit from social skills and job skills (vocational) training. A combination of medicine and counseling is usually best for managing the disorder over time. A procedure in which electricity is applied to the brain through the scalp (electroconvulsive therapy) may be used to treat people with severe manic symptoms that do not respond to medicine and counseling. HOME CARE INSTRUCTIONS   Take all your medicine as prescribed.  Check with your health care provider before starting new prescription or over-the-counter medicines.  Keep all follow up appointments with your health care provider. SEEK MEDICAL CARE IF:   If you are not able to take your medicines as prescribed.  If your symptoms get worse. SEEK IMMEDIATE MEDICAL CARE IF:   You have serious thoughts about hurting yourself or others. Document Released: 06/25/2006 Document Revised: 06/29/2013 Document Reviewed: 09/26/2012 Exit[employer] Patient Information 2015 [employer]. This information is not intended to replace advice given to you by your health care provider. Make sure you discuss any questions you have with your health care provider.  

## 2013-10-25 NOTE — ED Notes (Signed)
Pt sts he feels better and is leaving

## 2013-10-25 NOTE — [facility] Suicide Risk Assessment (Cosign Needed)
Suicide Risk Assessment  Discharge Assessment     Demographic Factors:  Male  Total Time spent with patient: 20 minutes  Psychiatric Specialty Exam:     Blood pressure 131/71, pulse 85, temperature 97.9 F (36.6 C), temperature source Oral, resp. rate 18, SpO2 100.00%.There is no weight on file to calculate BMI.   General Appearance: Casual   Eye Contact:: Good   Speech: Normal Rate   Volume: Normal   Mood: "I feel good"   Affect: Congruent   Thought Process: Coherent   Orientation: Full (Time, Place, and Person)   Thought Content: Auditory and visual hallucinations   Suicidal Thoughts: No   Homicidal Thoughts: No   Memory: Immediate; Good  Recent; Good  Remote; Good   Judgement: Fair   Insight: Fair   Psychomotor Activity: Normal   Concentration: Good   Recall: Good   Fund of Knowledge:Fair   Language: Fair   Akathisia: No   Handed: Right   AIMS (if indicated):   Assets: Communication Skills  Desire for Improvement  Financial Resources/Insurance  Housing  Leisure Time  Physical Health  Resilience  Social Support  Transportation   Sleep:   Musculoskeletal:  Strength & Muscle Tone: within normal limits  Gait & Station: normal  Patient leans: N/A   Mental Status Per Nursing Assessment::   On Admission:     Current Mental Status by Physician: Denies suidcidal/homicidal ideation, and paranoia  Loss Factors: NA  Historical Factors: NA  Risk Reduction Factors:   Living with another person, especially a relative and Positive social support  Continued Clinical Symptoms:  Previous Psychiatric Diagnoses and Treatments  Cognitive Features That Contribute To Risk:  Loss of executive function    Suicide Risk:  Minimal: No identifiable suicidal ideation.  Patients presenting with no risk factors but with morbid ruminations; may be classified as minimal risk based on the severity of the depressive symptoms  Discharge Diagnoses:  AXIS I: Schizoaffective  Disorder  AXIS II: Deferred  AXIS III:  Past Medical History   Diagnosis  Date   .  Asthma    .  Bipolar 1 disorder    .  ODD (oppositional defiant disorder)    .  ADHD (attention deficit hyperactivity disorder)    .  Schizophrenia    .  Suicidal ideation    .  Homicidal ideation    .  Explosive personality disorder     AXIS IV: other psychosocial or environmental problems and problems related to social environment  AXIS V: 61-70 mild symptoms  Plan Of Care/Follow-up recommendations:  Activity:  as tolerated Diet:  as tolerated  Is patient on multiple antipsychotic therapies at discharge:  No   Has Patient had three or more failed trials of antipsychotic monotherapy by history:  No  Recommended Plan for Multiple Antipsychotic Therapies: NA    Giara Mcgaughey, FNP-BC 10/25/2013, 12:47 PM

## 2013-10-25 NOTE — ED Notes (Signed)
D/C instructions reviewed with understanding stated. Provided with a bus pass. Ambulatory without difficulty. Denies pain. No complaints voiced. Belongings bag x1 returned. Escorted by security and [employer] to front of hospital.

## 2013-10-25 NOTE — Consult Note (Signed)
[facility] Follow Up Psychiatry Consult   Reason for Consult:  Hallucinations Referring Physician:  EDP  [name] is an 23 y.o. male. Total Time spent with patient: 20 minutes  Assessment: AXIS I:  Schizoaffective Disorder AXIS II:  Deferred AXIS III:   Past Medical History  Diagnosis Date  . Asthma   . Bipolar 1 disorder   . ODD (oppositional defiant disorder)   . ADHD (attention deficit hyperactivity disorder)   . Schizophrenia   . Suicidal ideation   . Homicidal ideation   . Explosive personality disorder    AXIS IV:  other psychosocial or environmental problems and problems related to social environment AXIS V:  61-70 mild symptoms  Plan:  No evidence of imminent risk to self or others at present.    Dr. Lamar Benes interviewed and concurs with the plan.  Subjective:   [name] is a 23 y.o. male patient does not warrant admission.  HPI:  Patient states that he is feeling better. Patient states he continues to hear the voices of [name] and [place].  States that the voices tells him to do bad things.  "but I have never listen to that and never done that."  Patient states that he came back to the hospital yesterday after being discharged because "My mama told me to go to a different hospital cause I need my meds changed."  Patient denies suicidal/homicidal ideation and paranoia.     Per [name] /Dr. Dwyane Singleton yesterday:  The patient has been in the ED daily with the same complaints.  However, when he is in the psych ED, he refuses any different medications and treatments.  [name]'s baseline is hallucinations even though he does not appear to be responding to internal stimuli.  An APS report was filed yesterday since his mother is getting a check but he has been in the ED for the past month and when the ACT team goes to his house, she is unavailable.  His ACT team is suppose to start looking for a group on Monday for him since he is not able to maintain stability in his  current environment.   Review of Systems  HENT: Negative.   Respiratory: Negative.   Gastrointestinal: Negative for nausea, vomiting, abdominal pain, diarrhea and constipation.  Musculoskeletal: Positive for joint pain (left wrist).  Psychiatric/Behavioral: Positive for hallucinations. Negative for depression, suicidal ideas and substance abuse. The patient is not nervous/anxious.     No family history on file.  Past Psychiatric History: Past Medical History  Diagnosis Date  . Asthma   . Bipolar 1 disorder   . ODD (oppositional defiant disorder)   . ADHD (attention deficit hyperactivity disorder)   . Schizophrenia   . Suicidal ideation   . Homicidal ideation   . Explosive personality disorder     reports that he has never smoked. He does not have any smokeless tobacco history on file. He reports that he does not drink alcohol or use illicit drugs. No family history on file.         Allergies:   Allergies  Allergen Reactions  . Carbamazepine Anaphylaxis, Other (See Comments) and Cough    Cough up blood  . Geodon [Ziprasidone Hcl] Anaphylaxis and Other (See Comments)    " Causes my throat to close"  . Haldol [Haloperidol Lactate] Anaphylaxis and Other (See Comments)    Patient reports that he stopped taking this because of throat swelling.   Christianne Borrow [Atomoxetine] Anaphylaxis, Other (See Comments) and Cough  Cough up blood  . Other Nausea Only and Other (See Comments)    Apple Juice, stomach hurts and itchy throat     ACT Assessment Complete:  Yes:    Educational Status    Risk to Self: Risk to self with the past 6 months Substance abuse history and/or treatment for substance abuse?: No  Risk to Others:    Abuse:    Prior Inpatient Therapy:    Prior Outpatient Therapy:    Additional Information:       Objective: Blood pressure 124/67, pulse 70, temperature 97.7 F (36.5 C), temperature source Oral, resp. rate 16, SpO2 100.00%.There is no weight on file to  calculate BMI. Results for orders placed during the hospital encounter of 10/23/13 (from the past 72 hour(s))  URINALYSIS, ROUTINE W REFLEX MICROSCOPIC     Status: None   Collection Time    10/23/13  4:17 PM      Result Value Ref Range   Color, Urine YELLOW  YELLOW   APPearance CLEAR  CLEAR   Specific Gravity, Urine 1.017  1.005 - 1.030   pH 6.0  5.0 - 8.0   Glucose, UA NEGATIVE  NEGATIVE mg/dL   Hgb urine dipstick NEGATIVE  NEGATIVE   Bilirubin Urine NEGATIVE  NEGATIVE   Ketones, ur NEGATIVE  NEGATIVE mg/dL   Protein, ur NEGATIVE  NEGATIVE mg/dL   Urobilinogen, UA 1.0  0.0 - 1.0 mg/dL   Nitrite NEGATIVE  NEGATIVE   Leukocytes, UA NEGATIVE  NEGATIVE   Comment: MICROSCOPIC NOT DONE ON URINES WITH NEGATIVE PROTEIN, BLOOD, LEUKOCYTES, NITRITE, OR GLUCOSE <1000 mg/dL.  URINE RAPID DRUG SCREEN (HOSP PERFORMED)     Status: None   Collection Time    10/23/13  4:17 PM      Result Value Ref Range   Opiates NONE DETECTED  NONE DETECTED   Cocaine NONE DETECTED  NONE DETECTED   Benzodiazepines NONE DETECTED  NONE DETECTED   Amphetamines NONE DETECTED  NONE DETECTED   Tetrahydrocannabinol NONE DETECTED  NONE DETECTED   Barbiturates NONE DETECTED  NONE DETECTED   Comment:            DRUG SCREEN FOR MEDICAL PURPOSES     ONLY.  IF CONFIRMATION IS NEEDED     FOR ANY PURPOSE, NOTIFY LAB     WITHIN 5 DAYS.                LOWEST DETECTABLE LIMITS     FOR URINE DRUG SCREEN     Drug Class       Cutoff (ng/mL)     Amphetamine      1000     Barbiturate      200     Benzodiazepine   497     Tricyclics       026     Opiates          300     Cocaine          300     THC              50  CBC WITH DIFFERENTIAL     Status: None   Collection Time    10/23/13  4:24 PM      Result Value Ref Range   WBC 4.5  4.0 - 10.5 K/uL   RBC 5.38  4.22 - 5.81 MIL/uL   Hemoglobin 14.3  13.0 - 17.0 g/dL   HCT 42.1  39.0 - 52.0 %  MCV 78.3  78.0 - 100.0 fL   MCH 26.6  26.0 - 34.0 pg   MCHC 34.0  30.0 - 36.0  g/dL   RDW 12.9  11.5 - 15.5 %   Platelets 242  150 - 400 K/uL   Neutrophils Relative % 64  43 - 77 %   Neutro Abs 2.8  1.7 - 7.7 K/uL   Lymphocytes Relative 24  12 - 46 %   Lymphs Abs 1.1  0.7 - 4.0 K/uL   Monocytes Relative 10  3 - 12 %   Monocytes Absolute 0.4  0.1 - 1.0 K/uL   Eosinophils Relative 2  0 - 5 %   Eosinophils Absolute 0.1  0.0 - 0.7 K/uL   Basophils Relative 0  0 - 1 %   Basophils Absolute 0.0  0.0 - 0.1 K/uL  COMPREHENSIVE METABOLIC PANEL     Status: Abnormal   Collection Time    10/23/13  4:24 PM      Result Value Ref Range   Sodium 140  137 - 147 mEq/L   Potassium 4.1  3.7 - 5.3 mEq/L   Chloride 101  96 - 112 mEq/L   CO2 25  19 - 32 mEq/L   Glucose, Bld 103 (*) 70 - 99 mg/dL   BUN 8  6 - 23 mg/dL   Creatinine, Ser 0.89  0.50 - 1.35 mg/dL   Calcium 9.7  8.4 - 10.5 mg/dL   Total Protein 8.2  6.0 - 8.3 g/dL   Albumin 4.3  3.5 - 5.2 g/dL   AST 22  0 - 37 U/L   Comment: NO VISIBLE HEMOLYSIS     HEMOLYSIS AT THIS LEVEL MAY AFFECT RESULT   ALT 16  0 - 53 U/L   Alkaline Phosphatase 73  39 - 117 U/L   Total Bilirubin 0.4  0.3 - 1.2 mg/dL   GFR calc non Af Amer >90  >90 mL/min   GFR calc Af Amer >90  >90 mL/min   Comment: (NOTE)     The eGFR has been calculated using the CKD EPI equation.     This calculation has not been validated in all clinical situations.     eGFR's persistently <90 mL/min signify possible Chronic Kidney     Disease.   Anion gap 14  5 - 15  ACETAMINOPHEN LEVEL     Status: None   Collection Time    10/23/13  4:24 PM      Result Value Ref Range   Acetaminophen (Tylenol), Serum <15.0  10 - 30 ug/mL   Comment:            THERAPEUTIC CONCENTRATIONS VARY     SIGNIFICANTLY. A RANGE OF 10-30     ug/mL MAY BE AN EFFECTIVE     CONCENTRATION FOR MANY PATIENTS.     HOWEVER, SOME ARE BEST TREATED     AT CONCENTRATIONS OUTSIDE THIS     RANGE.     ACETAMINOPHEN CONCENTRATIONS     >150 ug/mL AT 4 HOURS AFTER     INGESTION AND >50 ug/mL AT 12      HOURS AFTER INGESTION ARE     OFTEN ASSOCIATED WITH TOXIC     REACTIONS.  SALICYLATE LEVEL     Status: Abnormal   Collection Time    10/23/13  4:24 PM      Result Value Ref Range   Salicylate Lvl <6.3 (*) 2.8 - 20.0 mg/dL  ETHANOL  Status: None   Collection Time    10/23/13  4:24 PM      Result Value Ref Range   Alcohol, Ethyl (B) <11  0 - 11 mg/dL   Comment:            LOWEST DETECTABLE LIMIT FOR     SERUM ALCOHOL IS 11 mg/dL     FOR MEDICAL PURPOSES ONLY   Labs are reviewed and are pertinent for no medical issues noted.  Medications reviewed and no changes made  Current Facility-Administered Medications  Medication Dose Route Frequency Provider Last Rate Last Dose  . albuterol (PROVENTIL HFA;VENTOLIN HFA) 108 (90 BASE) MCG/ACT inhaler 1 puff  1 puff Inhalation Q6H PRN Leota Jacobsen, MD      . ARIPiprazole (ABILIFY) tablet 10 mg  10 mg Oral Daily Leota Jacobsen, MD   10 mg at 10/25/13 1019  . benztropine (COGENTIN) tablet 1 mg  1 mg Oral BID Leota Jacobsen, MD   1 mg at 10/25/13 7371  . chlorproMAZINE (THORAZINE) tablet 100 mg  100 mg Oral BID Leota Jacobsen, MD   100 mg at 10/25/13 1019  . divalproex (DEPAKOTE ER) 24 hr tablet 500 mg  500 mg Oral BID Leota Jacobsen, MD   500 mg at 10/25/13 1018  . ibuprofen (ADVIL,MOTRIN) tablet 400 mg  400 mg Oral Q6H PRN Leota Jacobsen, MD   400 mg at 10/25/13 0831  . ondansetron (ZOFRAN) tablet 4 mg  4 mg Oral Q8H PRN Leota Jacobsen, MD      . traZODone (DESYREL) tablet 50 mg  50 mg Oral QHS PRN Leota Jacobsen, MD       Current Outpatient Prescriptions  Medication Sig Dispense Refill  . albuterol (PROVENTIL HFA;VENTOLIN HFA) 108 (90 BASE) MCG/ACT inhaler Inhale 1 puff into the lungs every 6 (six) hours as needed for wheezing or shortness of breath.      . ARIPiprazole (ABILIFY MAINTENA) 400 MG SUSR Inject 400 mg into the muscle every 30 (thirty) days.      . ARIPiprazole (ABILIFY) 10 MG tablet Take 1 tablet (10 mg total) by mouth  daily.  14 tablet  0  . benztropine (COGENTIN) 1 MG tablet Take 1 tablet (1 mg total) by mouth 2 (two) times daily.  60 tablet  0  . chlorproMAZINE (THORAZINE) 100 MG tablet Take 100 mg by mouth 2 (two) times daily.      . divalproex (DEPAKOTE ER) 500 MG 24 hr tablet Take 500 mg by mouth 2 (two) times daily.      . traZODone (DESYREL) 50 MG tablet Take 50 mg by mouth at bedtime as needed for sleep.       Psychiatric Specialty Exam:     Blood pressure 131/71, pulse 85, temperature 97.9 F (36.6 C), temperature source Oral, resp. rate 18, SpO2 100.00%.There is no weight on file to calculate BMI.  General Appearance: Casual  Eye Contact::  Good  Speech:  Normal Rate  Volume:  Normal  Mood:  "I feel good"  Affect:  Congruent  Thought Process:  Coherent  Orientation:  Full (Time, Place, and Person)  Thought Content:  Auditory and visual hallucinations  Suicidal Thoughts:  No  Homicidal Thoughts:  No  Memory:  Immediate;   Good Recent;   Good Remote;   Good  Judgement:  Fair  Insight:  Fair  Psychomotor Activity:  Normal  Concentration:  Good  Recall:  Good  Fund of Knowledge:Fair  Language: Fair  Akathisia:  No  Handed:  Right  AIMS (if indicated):     Assets:  Communication Skills Desire for Improvement Financial Resources/Insurance Housing Leisure Time Physical Health Resilience Social Support Transportation  Sleep:       Musculoskeletal: Strength & Muscle Tone: within normal limits Gait & Station: normal Patient leans: N/A  Treatment Plan Summary: Discharge home with his ACT team who will manage his care;   [name] Newport, FNP-BC 10/25/2013 12:30 PM  Patient is seen face to face for this evaluation, case discussed with physician extender, reviewed the written care plan and made treatment plan. Reviewed the information documented and agree with the treatment plan.  [name],JANARDHAHA R. 10/26/2013 11:08 AM

## 2013-10-25 NOTE — ED Provider Notes (Signed)
CSN: [id number]     Arrival date & time 10/25/13  2324 History   First MD Initiated Contact with Patient 10/25/13 2330     No chief complaint on file.    (Consider location/radiation/quality/duration/timing/severity/associated sxs/prior Treatment) HPI Comments: Patient with schizophrenia, bipolar, presents emergency department with hallucinations, and suicidal and homicidal ideation. States that he speaks with [name] and [place]. They are telling him to kill other people, but said that they would make him deal with him if he killed himself, he wouldn't have to kill other people. He states that they told him to "cut out his vein."  Recently seen multiple times in the ED, and was being held at [facility] for placement by ACT on Monday.  Patient denies headache, blurred vision, new hearing loss, sore throat, chest pain, shortness of breath, nausea, vomiting, diarrhea, constipation, dysuria, peripheral edema, back pain, numbness or tingling of the extremities.   The history is provided by the patient. No language interpreter was used.    Past Medical History  Diagnosis Date  . Asthma   . Bipolar 1 disorder   . ODD (oppositional defiant disorder)   . ADHD (attention deficit hyperactivity disorder)   . Schizophrenia   . Suicidal ideation   . Homicidal ideation   . Explosive personality disorder    No past surgical history on file. No family history on file. History  Substance Use Topics  . Smoking status: Never Smoker   . Smokeless tobacco: Not on file  . Alcohol Use: No     Comment: Patient denies    Review of Systems  Skin: Positive for wound.  Psychiatric/Behavioral: Positive for suicidal ideas, hallucinations, self-injury and agitation. The patient is nervous/anxious.   All other systems reviewed and are negative.     Allergies  Carbamazepine; Geodon; Haldol; Strattera; and Other  Home Medications   Prior to Admission medications   Medication Sig Start Date End Date Taking?  Authorizing Provider  albuterol (PROVENTIL HFA;VENTOLIN HFA) 108 (90 BASE) MCG/ACT inhaler Inhale 1 puff into the lungs every 6 (six) hours as needed for wheezing or shortness of breath.   Yes Historical Provider, MD  ARIPiprazole (ABILIFY) 10 MG tablet Take 1 tablet (10 mg total) by mouth daily. 10/18/13  Yes Shuvon Rankin, NP  benztropine (COGENTIN) 1 MG tablet Take 1 tablet (1 mg total) by mouth 2 (two) times daily. 10/13/13  Yes Beau Fanny, FNP  chlorproMAZINE (THORAZINE) 100 MG tablet Take 100 mg by mouth 2 (two) times daily.   Yes Historical Provider, MD  divalproex (DEPAKOTE ER) 500 MG 24 hr tablet Take 500 mg by mouth 2 (two) times daily.   Yes Historical Provider, MD  ARIPiprazole (ABILIFY MAINTENA) 400 MG SUSR Inject 400 mg into the muscle every 30 (thirty) days.    Historical Provider, MD  traZODone (DESYREL) 50 MG tablet Take 50 mg by mouth at bedtime as needed for sleep.    Historical Provider, MD   There were no vitals taken for this visit. Physical Exam  Nursing note and vitals reviewed. Constitutional: He is oriented to person, place, and time. He appears well-developed and well-nourished.  HENT:  Head: Normocephalic and atraumatic.  Eyes: Conjunctivae and EOM are normal.  Neck: Normal range of motion.  Cardiovascular: Normal rate.   Pulmonary/Chest: Effort normal.  Abdominal: He exhibits no distension.  Musculoskeletal: Normal range of motion.  Neurological: He is alert and oriented to person, place, and time.  Skin: Skin is dry.  Psychiatric: His mood appears  anxious. His affect is angry and inappropriate. His speech is not rapid and/or pressured. He is agitated and aggressive. He is not combative. He expresses homicidal and suicidal ideation.    ED Course  Procedures (including critical care time) Labs Review Labs Reviewed - No data to display  Imaging Review Dg Wrist Complete Left  10/24/2013   CLINICAL DATA:  Larey Seat 3 days ago.  Left wrist pain.  EXAM: LEFT WRIST  - COMPLETE 3+ VIEW  COMPARISON:  None.  FINDINGS: There is no evidence of fracture or dislocation. There is no evidence of arthropathy or other focal bone abnormality. Soft tissues are unremarkable.  IMPRESSION: Negative.   Electronically Signed   By: Burman Nieves M.D.   On: 10/24/2013 06:46     EKG Interpretation   Date/Time:  Sunday October 25 2013 23:39:07 EDT Ventricular Rate:  91 PR Interval:  162 QRS Duration: 89 QT Interval:  333 QTC Calculation: 410 R Axis:   31 Text Interpretation:  Sinus rhythm Normal ECG Confirmed by POLLINA  MD,  CHRISTOPHER [phone]) on 10/25/2013 11:42:02 PM      MDM   Final diagnoses:  None    Patient immediately discussed with ACTT team per the care plan.  I spoke with Clement Sayres.  States to hold the patient overnight, and then he will see the patient in the morning.  I discussed this with Dr. Wilkie Aye.  Will hold in Pod A.  ACTT to see patient in morning.  In reviewing notes, ACTT team was working on finding placement, and patient was being held at [facility], but was discharged.   Patient discussed with Dr. Wilkie Aye, who will continue care until AM.    Roxy Horseman, PA-C 10/26/13 0002

## 2013-10-26 ENCOUNTER — Encounter (HOSPITAL_COMMUNITY): Payer: Self-pay | Admitting: Emergency Medicine

## 2013-10-26 MED ORDER — ACETAMINOPHEN 325 MG PO TABS
650.0000 mg | ORAL_TABLET | Freq: Once | ORAL | Status: AC
  Administered 2013-10-26: 650 mg via ORAL
  Filled 2013-10-26: qty 2

## 2013-10-26 NOTE — ED Notes (Signed)
Darrick Penna, team leader with ACT at bedside

## 2013-10-26 NOTE — Clinical Social Work Note (Signed)
Clinical Social Worker met with CM and Beverly Sessions Mariann Laster) - patient does have a care plan in place and plans to discharge to [facility].  Mariann Laster, from [place] to provide transportation to patient at discharge.  CSW remains available for support as needed.  Barbette Or, [place]

## 2013-10-26 NOTE — ED Notes (Signed)
Meal tray ordered 

## 2013-10-26 NOTE — ED Notes (Signed)
Belongings returned to pt

## 2013-10-26 NOTE — ED Provider Notes (Signed)
Pt seen by the ACT team and care plan from ED was activated. [facility] provider at bedside. She has assessed the patient, and is comfortable taking the patient back to [facility], and have the psychiatrist see the patient in the office today. Will discharge.  Derwood Kaplan, MD 10/26/13 1014

## 2013-10-26 NOTE — Discharge Instructions (Signed)
We saw you in the ER for your mental health concerns and had our behavioral health team evaluate you. The team feels comfortable sending you home, please follow the recommendations given to you by them and the follow-up and medications they have prescribed to you. Please refrain from substance abuse. Return to the ER if your symptoms worsen.   Suicidal Feelings, How to Help Yourself Everyone feels sad or unhappy at times, but depressing thoughts and feelings of hopelessness can lead to thoughts of suicide. It can seem as if life is too tough to handle. If you feel as though you have reached the point where suicide is the only answer, it is time to let someone know immediately.  HOW TO COPE AND PREVENT SUICIDE  Let family, friends, teachers, or counselors know. Get help. Try not to isolate yourself from those who care about you. Even though you may not feel sociable, talk with someone every day. It is best if it is face-to-face. Remember, they will want to help you.  Eat a regularly spaced and well-balanced diet.  Get plenty of rest.  Avoid alcohol and drugs because they will only make you feel worse and may also lower your inhibitions. Remove them from the home. If you are thinking of taking an overdose of your prescribed medicines, give your medicines to someone who can give them to you one day at a time. If you are on antidepressants, let your caregiver know of your feelings so he or she can provide a safer medicine, if that is a concern.  Remove weapons or poisons from your home.  Try to stick to routines. Follow a schedule and remind yourself that you have to keep that schedule every day.  Set some realistic goals and achieve them. Make a list and cross things off as you go. Accomplishments give a sense of worth. Wait until you are feeling better before doing things you find difficult or unpleasant to do.  If you are able, try to start exercising. Even half-hour periods of exercise each  day will make you feel better. Getting out in the sun or into nature helps you recover from depression faster. If you have a favorite place to walk, take advantage of that.  Increase safe activities that have always given you pleasure. This may include playing your favorite music, reading a good book, painting a picture, or playing your favorite instrument. Do whatever takes your mind off your depression.  Keep your living space well-lighted. GET HELP Contact a suicide hotline, crisis center, or local suicide prevention center for help right away. Local centers may include a hospital, clinic, community service organization, social service provider, or health department.  Call your local emergency services (911 in the [place]).  Call a suicide hotline:  1-800-273-TALK ((218)106-9767) in the [place].  1-800-SUICIDE 914-155-5852) in the [place].  4758230210 in the [place] for Spanish-speaking counselors.  4-696-295-2WUX 318-375-8785) in the [place] for TTY users.  Visit the following websites for information and help:  National Suicide Prevention Lifeline: www.suicidepreventionlifeline.org  Hopeline: www.hopeline.com  [employer] for Suicide Prevention: https://www.ayers.com/  For lesbian, gay, bisexual, transgender, or questioning youth, contact [employer]:  3-664-4-I-HKVQQV 720-324-4051) in the [place].  www.thetrevorproject.org  In [place], treatment resources are listed in each province with listings available under [employer] for [employer] or similar titles. Another source for Crisis Centres by [place] is located at http://www.suicideprevention.ca/in-crisis-now/find-a-crisis-centre-now/crisis-centres Document Released: 08/19/2002 Document Revised: 05/07/2011 Document Reviewed: 06/09/2013 ExitCare Patient Information  2015 [employer]. This information is not intended to replace advice given to you by your health  care provider. Make sure you discuss any questions you have with your health care provider.

## 2013-10-26 NOTE — ED Notes (Signed)
Meal given

## 2013-10-26 NOTE — ED Notes (Signed)
[name], case manager present discussing pts situation

## 2013-10-26 NOTE — ED Notes (Signed)
This RN called zola, ACT team lead for update. No answer left voicemail.

## 2013-10-26 NOTE — ED Notes (Addendum)
Pt placed in paper scrubs, belongings removed from bedside. [employer] at bedside. Sitter at bedside. House coverage notified for need for sitter.

## 2013-10-26 NOTE — Progress Notes (Addendum)
  CARE MANAGEMENT ED NOTE 10/26/2013  Patient:  [name], [name]   Account Number:  [id number]  Date Initiated:  10/26/2013  Documentation initiated by:  Edwyna Shell  Subjective/Objective Assessment:   23 yo male presenitng to the ED with c/o SI     Subjective/Objective Assessment Detail:     Action/Plan:   Patient will continue to be followed by ACT team through [facility]   Action/Plan Detail:   Anticipated DC Date:       Status Recommendation to Physician:   Result of Recommendation:        Choice offered to / List presented to:            Status of service:  In process, will continue to follow  ED Comments:   ED Comments Detail:  Edwyna Shell RN, Case Manager ED [facility], 10/26/2013 8:50 This CM followed up with Mariann Laster, Act team leader, and she stated that she will be coming to the ED around 9:15 to discuss the patient's care and if he is discharged to then takre patient home. Mariann Laster stated that the ACT team is unable to place the patirent in a long term care facility and thast is what the patient is requesting. She stated that patient's usually are placed form the hospital ED. Mariann Laster stated that she will continue to transport the patient home but that the ACT team is limited in their role with this patient. This CM then called and spoke with [facility] counselor, Pincus Sanes, and she infomred this CM that the patient's ACT team has been put in place to manage the patient 's care and managing his care from the community is within their scope and is thie role as a provider. She stated that the patient's psychiatrist and ACT team need to reassess the patient for the appropriate level of care and create a plan accordingly. Pincus Sanes stated that is what the Care Plan for the patient states and needs to be followed. This CM L/M for LCSW to be involved with ACT team conversation. Will continue to follow.   9:30 this CM met with Mariann Laster and Elsie Ra, to discuss the ED Care Plan involving the patient [facility] Act  team. Mariann Laster stated that [facility] director contacted the [facility] for further directions on resources for patient to assess other resources. Mariann Laster stated that they are waiting to hear back. Mariann Laster did receive the patient signed permission to talk to his mother which Mariann Laster has not been able to previously obtain. Mariann Laster assessed the patient and deemed he was safe to dicharge which was then communoicated to Dr. Kathrynn Humble. The patient was discharged to [facility] with [facility] and she is taking him directly to the office to be seen be his psychiatrist.

## 2013-10-26 NOTE — ED Provider Notes (Signed)
Medical screening examination/treatment/procedure(s) were performed by non-physician practitioner and as supervising physician I was immediately available for consultation/collaboration.   EKG Interpretation   Date/Time:  Sunday October 25 2013 23:39:07 EDT Ventricular Rate:  91 PR Interval:  162 QRS Duration: 89 QT Interval:  333 QTC Calculation: 410 R Axis:   31 Text Interpretation:  Sinus rhythm Normal ECG Confirmed by Blinda Leatherwood  MD,  CHRISTOPHER ([id number]) on 10/25/2013 11:42:02 PM        Shon Baton, MD 10/26/13 [phone]

## 2013-10-26 NOTE — ED Notes (Signed)
Pt wanded by security. 

## 2013-10-26 NOTE — ED Notes (Signed)
Pt arrives via EMS from home. Pt is suicidal with plan to hangself with sheet over balcony. Pt also c/o chest pain to left side, dizziness. Pt states pain feel like "someone is sitting on his chest. Pt also c/o left wrist pain. VS 170/100 HR 100. Pt denies SOB. Per EMS pt tried to cut himself yesterday. Pt has scratches to left arm. Pt denies taking any drugs, alcohol and has not taken prozac.

## 2013-10-27 ENCOUNTER — Encounter (HOSPITAL_COMMUNITY): Payer: Self-pay | Admitting: Emergency Medicine

## 2013-10-27 ENCOUNTER — Emergency Department (HOSPITAL_COMMUNITY)
Admission: EM | Admit: 2013-10-27 | Discharge: 2013-10-28 | Disposition: A | Discharge status: Home / Self Care | Payer: Medicaid Other | Attending: Emergency Medicine | Admitting: Emergency Medicine

## 2013-10-27 DIAGNOSIS — F259 Schizoaffective disorder, unspecified: Secondary | ICD-10-CM | POA: Diagnosis not present

## 2013-10-27 DIAGNOSIS — F319 Bipolar disorder, unspecified: Secondary | ICD-10-CM | POA: Diagnosis not present

## 2013-10-27 DIAGNOSIS — F603 Borderline personality disorder: Secondary | ICD-10-CM | POA: Insufficient documentation

## 2013-10-27 DIAGNOSIS — R45850 Homicidal ideations: Secondary | ICD-10-CM | POA: Insufficient documentation

## 2013-10-27 DIAGNOSIS — J45909 Unspecified asthma, uncomplicated: Secondary | ICD-10-CM | POA: Insufficient documentation

## 2013-10-27 DIAGNOSIS — F250 Schizoaffective disorder, bipolar type: Secondary | ICD-10-CM

## 2013-10-27 DIAGNOSIS — Z79899 Other long term (current) drug therapy: Secondary | ICD-10-CM | POA: Diagnosis not present

## 2013-10-27 MED ORDER — ACETAMINOPHEN 325 MG PO TABS: 650.0000 mg | ORAL_TABLET | ORAL | Status: DC | PRN

## 2013-10-27 MED ORDER — LORAZEPAM 1 MG PO TABS: 1.0000 mg | ORAL_TABLET | Freq: Three times a day (TID) | ORAL | Status: DC | PRN

## 2013-10-27 MED ORDER — ZOLPIDEM TARTRATE 5 MG PO TABS: 5.0000 mg | ORAL_TABLET | Freq: Every evening | ORAL | Status: DC | PRN

## 2013-10-27 MED ORDER — ONDANSETRON HCL 4 MG PO TABS: 4.0000 mg | ORAL_TABLET | Freq: Three times a day (TID) | ORAL | Status: DC | PRN

## 2013-10-27 MED ORDER — IBUPROFEN 400 MG PO TABS: 600.0000 mg | ORAL_TABLET | Freq: Three times a day (TID) | ORAL | Status: DC | PRN

## 2013-10-27 NOTE — ED Notes (Signed)
Talked with pt. ACT team member per care plan. ACT team stated to let her know when there is a plan for the patient.

## 2013-10-27 NOTE — ED Provider Notes (Signed)
CSN: [id number]     Arrival date & time 10/27/13  2218 History   First MD Initiated Contact with Patient 10/27/13 2234     Chief Complaint  Patient presents with  . Homicidal     (Consider location/radiation/quality/duration/timing/severity/associated sxs/prior Treatment) HPI Comments: Patient is a 23 yo M PMHx significant for Asthma, Bipolar 1 disorder, ODD, ADHD, Schizophrenia, Explosive personality disorder presenting to the ED via [employer] for auditory hallucinations. Patient states he is hearing the voices tell him to hurt other people. Patient denies any SI. He states he did not take his regular medications this evening, because he "does not like the way they make me feel." No physical complaints.    Past Medical History  Diagnosis Date  . Asthma   . Bipolar 1 disorder   . ODD (oppositional defiant disorder)   . ADHD (attention deficit hyperactivity disorder)   . Schizophrenia   . Suicidal ideation   . Homicidal ideation   . Explosive personality disorder    History reviewed. No pertinent past surgical history. No family history on file. History  Substance Use Topics  . Smoking status: Never Smoker   . Smokeless tobacco: Not on file  . Alcohol Use: No     Comment: Patient denies    Review of Systems  Psychiatric/Behavioral: Positive for hallucinations. Negative for suicidal ideas, sleep disturbance and self-injury.  All other systems reviewed and are negative.     Allergies  Carbamazepine; Geodon; Haldol; Strattera; and Other  Home Medications   Prior to Admission medications   Medication Sig Start Date End Date Taking? Authorizing Provider  albuterol (PROVENTIL HFA;VENTOLIN HFA) 108 (90 BASE) MCG/ACT inhaler Inhale 1 puff into the lungs every 6 (six) hours as needed for wheezing or shortness of breath.   Yes Historical Provider, MD  ARIPiprazole (ABILIFY MAINTENA) 400 MG SUSR Inject 400 mg into the muscle every 30 (thirty) days.   Yes Historical Provider, MD   benztropine (COGENTIN) 1 MG tablet Take 1 tablet (1 mg total) by mouth 2 (two) times daily. 10/13/13  Yes Beau Fanny, FNP  chlorproMAZINE (THORAZINE) 100 MG tablet Take 100 mg by mouth 2 (two) times daily.   Yes Historical Provider, MD  divalproex (DEPAKOTE ER) 500 MG 24 hr tablet Take 500 mg by mouth 2 (two) times daily.   Yes Historical Provider, MD  traZODone (DESYREL) 50 MG tablet Take 50 mg by mouth at bedtime as needed for sleep.   Yes Historical Provider, MD  ARIPiprazole (ABILIFY) 10 MG tablet Take 1 tablet (10 mg total) by mouth daily. 10/18/13   Shuvon Rankin, NP   BP 144/88  Pulse 89  Temp(Src) 97.9 F (36.6 C) (Oral)  Resp 18  Ht  (1.778 m)  Wt 275 lb (124.739 kg)  BMI 39.46 kg/m2  SpO2 100% Physical Exam  Nursing note and vitals reviewed. Constitutional: He is oriented to person, place, and time. He appears well-developed and well-nourished. No distress.  HENT:  Head: Normocephalic and atraumatic.  Right Ear: External ear normal.  Left Ear: External ear normal.  Nose: Nose normal.  Mouth/Throat: Oropharynx is clear and moist.  Eyes: Conjunctivae are normal.  Neck: Normal range of motion. Neck supple.  Cardiovascular: Normal rate.   Pulmonary/Chest: Effort normal.  Abdominal: Soft.  Musculoskeletal: Normal range of motion.  Neurological: He is alert and oriented to person, place, and time.  Skin: Skin is warm and dry. He is not diaphoretic.  Psychiatric: He has a normal mood and affect.  His speech is normal. He is actively hallucinating. He is not agitated and not aggressive. He expresses homicidal ideation. He expresses no suicidal ideation. He expresses homicidal plans. He expresses no suicidal plans.    ED Course  Procedures (including critical care time) Labs Review Medications  LORazepam (ATIVAN) tablet 1 mg (not administered)  acetaminophen (TYLENOL) tablet 650 mg (not administered)  ibuprofen (ADVIL,MOTRIN) tablet 600 mg (not administered)   zolpidem (AMBIEN) tablet 5 mg (not administered)  ondansetron (ZOFRAN) tablet 4 mg (not administered)    Labs Reviewed - No data to display  Imaging Review No results found.   EKG Interpretation None      MDM   Final diagnoses:  Schizoaffective disorder, bipolar type    Filed Vitals:   10/28/13 0034  BP: 144/88  Pulse: 89  Temp: 97.9 F (36.6 C)  Resp: 18   Afebrile, NAD, non-toxic appearing, AAOx4.  Patient with multiple visits to the ER for similar complaints. No concerning physical examination findings. Psychiatric medical screening labs will not be ordered at this time. Attempted several times to get a hold of patient's ACT member, but was unsuccessful. Patient decided to recant ideations and would like to be discharged home. Advised follow up with behavioral health ACT team member in the morning. Return precautions discussed. Patient is agreeable to plan. Patient is stable at time of discharge. Patient d/w with Dr. Jeraldine Loots, agrees with plan.       Jeannetta Ellis, PA-C 10/28/13 [phone]

## 2013-10-27 NOTE — ED Notes (Signed)
Pt to ED with [facility] from home.  Pt reports hearing 2 voices- reports they are telling him to hurt other people.  Denies SI, admits to HI- states "the voices tell me to hurt people".  Pt reports taking home medications this morning but he doesn't like the way they make him feel so he did not take them tonight.  Pt changed into paper scrubs and wanded by security.

## 2013-10-27 NOTE — ED Notes (Signed)
ACT team notified per pt Care Plan

## 2013-10-27 NOTE — Consult Note (Signed)
Case discussed, and agree with plan 

## 2013-10-27 NOTE — ED Notes (Signed)
Patient changed into paper scrubs, belongings given to RN, patient wanded by security

## 2013-10-28 ENCOUNTER — Emergency Department (HOSPITAL_COMMUNITY)
Admission: EM | Admit: 2013-10-28 | Discharge: 2013-10-28 | Disposition: A | Discharge status: Home / Self Care | Payer: Federal, State, Local not specified - Other | Attending: Emergency Medicine | Admitting: Emergency Medicine

## 2013-10-28 ENCOUNTER — Emergency Department (HOSPITAL_COMMUNITY)
Admission: EM | Admit: 2013-10-28 | Discharge: 2013-10-28 | Disposition: A | Discharge status: Home / Self Care | Payer: Federal, State, Local not specified - Other | Source: Home / Self Care | Attending: Emergency Medicine | Admitting: Emergency Medicine

## 2013-10-28 ENCOUNTER — Encounter (HOSPITAL_COMMUNITY): Payer: Self-pay | Admitting: Emergency Medicine

## 2013-10-28 DIAGNOSIS — X789XXA Intentional self-harm by unspecified sharp object, initial encounter: Secondary | ICD-10-CM | POA: Insufficient documentation

## 2013-10-28 DIAGNOSIS — R45851 Suicidal ideations: Secondary | ICD-10-CM | POA: Diagnosis present

## 2013-10-28 DIAGNOSIS — J45909 Unspecified asthma, uncomplicated: Secondary | ICD-10-CM | POA: Insufficient documentation

## 2013-10-28 DIAGNOSIS — Z79899 Other long term (current) drug therapy: Secondary | ICD-10-CM | POA: Insufficient documentation

## 2013-10-28 DIAGNOSIS — IMO0002 Reserved for concepts with insufficient information to code with codable children: Secondary | ICD-10-CM

## 2013-10-28 DIAGNOSIS — F259 Schizoaffective disorder, unspecified: Secondary | ICD-10-CM

## 2013-10-28 DIAGNOSIS — F209 Schizophrenia, unspecified: Secondary | ICD-10-CM | POA: Insufficient documentation

## 2013-10-28 DIAGNOSIS — R45850 Homicidal ideations: Secondary | ICD-10-CM

## 2013-10-28 DIAGNOSIS — F319 Bipolar disorder, unspecified: Secondary | ICD-10-CM

## 2013-10-28 DIAGNOSIS — F250 Schizoaffective disorder, bipolar type: Secondary | ICD-10-CM

## 2013-10-28 DIAGNOSIS — Z765 Malingerer [conscious simulation]: Secondary | ICD-10-CM | POA: Diagnosis not present

## 2013-10-28 DIAGNOSIS — R443 Hallucinations, unspecified: Secondary | ICD-10-CM

## 2013-10-28 DIAGNOSIS — F603 Borderline personality disorder: Secondary | ICD-10-CM

## 2013-10-28 MED ORDER — ARIPIPRAZOLE 10 MG PO TABS
10.0000 mg | ORAL_TABLET | Freq: Once | ORAL | Status: DC
  Filled 2013-10-28: qty 1

## 2013-10-28 NOTE — ED Notes (Signed)
Patient is now IVC by [employer].

## 2013-10-28 NOTE — ED Provider Notes (Signed)
CSN: [id number]     Arrival date & time 10/28/13  0154 History   First MD Initiated Contact with Patient 10/28/13 [phone]     Chief Complaint  Patient presents with  . Suicidal     (Consider location/radiation/quality/duration/timing/severity/associated sxs/prior Treatment) HPI Patient has a history of bipolar, oppositional defiant disorder, ADHD, schizophrenia and malingering. He presents often to the emergency department and has a care plan in place. He is followed by [facility] as an outpatient. He is brought in by [facility] for reported suicidal ideation and gesture. No IVC was placed. Patient is laughing and joking in his room. Patient specifically stated that "I will do myself if you release me and if you hold me I will harm myself and then hurt other people and you will have that on your continence." The patient does not have a specific plan for suicide or homicide. He has no hallucinations. Denies drug or alcohol intake. Past Medical History  Diagnosis Date  . Asthma   . Bipolar 1 disorder   . ODD (oppositional defiant disorder)   . ADHD (attention deficit hyperactivity disorder)   . Schizophrenia   . Suicidal ideation   . Homicidal ideation   . Explosive personality disorder    History reviewed. No pertinent past surgical history. History reviewed. No pertinent family history. History  Substance Use Topics  . Smoking status: Never Smoker   . Smokeless tobacco: Not on file  . Alcohol Use: No     Comment: Patient denies    Review of Systems  Constitutional: Negative for fever and chills.  Respiratory: Negative for shortness of breath.   Cardiovascular: Negative for chest pain.  Gastrointestinal: Negative for nausea, vomiting and abdominal pain.  Skin: Positive for wound.  Neurological: Negative for weakness, numbness and headaches.  Psychiatric/Behavioral: Positive for self-injury and agitation. Negative for hallucinations.  All other systems reviewed and are  negative.     Allergies  Carbamazepine; Geodon; Haldol; Strattera; and Other  Home Medications   Prior to Admission medications   Medication Sig Start Date End Date Taking? Authorizing Provider  albuterol (PROVENTIL HFA;VENTOLIN HFA) 108 (90 BASE) MCG/ACT inhaler Inhale 1 puff into the lungs every 6 (six) hours as needed for wheezing or shortness of breath.   Yes Historical Provider, MD  ARIPiprazole (ABILIFY MAINTENA) 400 MG SUSR Inject 400 mg into the muscle every 30 (thirty) days.   Yes Historical Provider, MD  benztropine (COGENTIN) 1 MG tablet Take 1 tablet (1 mg total) by mouth 2 (two) times daily. 10/13/13  Yes Beau Fanny, FNP  chlorproMAZINE (THORAZINE) 100 MG tablet Take 100 mg by mouth 2 (two) times daily.   Yes Historical Provider, MD  divalproex (DEPAKOTE ER) 500 MG 24 hr tablet Take 500 mg by mouth 2 (two) times daily.   Yes Historical Provider, MD  traZODone (DESYREL) 50 MG tablet Take 50 mg by mouth at bedtime as needed for sleep.   Yes Historical Provider, MD  ARIPiprazole (ABILIFY) 10 MG tablet Take 1 tablet (10 mg total) by mouth daily. 10/18/13   Shuvon Rankin, NP   BP 134/109  Pulse 101  Resp 20  SpO2 100% Physical Exam  Nursing note and vitals reviewed. Constitutional: He is oriented to person, place, and time. He appears well-developed and well-nourished. No distress.  HENT:  Head: Normocephalic and atraumatic.  Mouth/Throat: Oropharynx is clear and moist.  Eyes: EOM are normal. Pupils are equal, round, and reactive to light.  Neck: Normal range of motion. Neck supple.  Cardiovascular: Normal rate and regular rhythm.   Pulmonary/Chest: Effort normal and breath sounds normal. No respiratory distress. He has no wheezes. He has no rales.  Abdominal: Soft. Bowel sounds are normal. He exhibits no distension and no mass. There is no tenderness. There is no rebound and no guarding.  Musculoskeletal: Normal range of motion. He exhibits no edema and no tenderness.   Patient has a bandage over his left forearm. He is resistant to having this removed. When the bandage is removed it the patient has a superficial abrasion over the ventral surface of the left forearm. This appears to be several days old with no the healing crust as been disturbed with a very small amount of bleeding. Patient has good distal pulses. Good cap refill.  Neurological: He is alert and oriented to person, place, and time.  Moves all extremities without deficit. Good grip strength especially in the left hand. Sensation is intact.  Skin: Skin is warm and dry. No rash noted. No erythema.  Psychiatric: He has a normal mood and affect. His behavior is normal.  Patient is calm. He did not appear to be depressed. He is not psychotic does not appear to be responding to internal stimuli. He is demonstrating very manipulative behavior as someone well familiar with the psychiatric system.      ED Course  Procedures (including critical care time) Labs Review Labs Reviewed - No data to display  Imaging Review No results found.   EKG Interpretation None      MDM   Final diagnoses:  None    Discussed patient with ACT team and extensively reviewed patient previous records. Patient has a clear history of very manipulative behavior and is demonstrating that here in the emergency department. His left forearm wound was inflicted several days ago. He has no new injuries. He has no specific plans for any suicidal or homicidal actions. ACT team recommends discharge home and will followup tomorrow morning. Do not see any cause for laboratory examination at this time or for IVC commitment of the patient since he is not wanting to stay in the emergency department.  The accompanying police officers however feels that the patient should be placed under involuntary commitment and will file paperwork.  Patient is medically cleared to be evaluated by the psychiatric team in the morning.      Loren Racer, MD 10/28/13 [phone]

## 2013-10-28 NOTE — ED Notes (Signed)
Act team at bedside talking to pt as per her care plan

## 2013-10-28 NOTE — Consult Note (Signed)
Telepsych Consultation   Reason for Consult:  SI/HI Referring Physician:  EDMD  [name] is an 23 y.o. male. 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 Asthma   . Bipolar 1 disorder   . ODD (oppositional defiant disorder)   . ADHD (attention deficit hyperactivity disorder)   . Schizophrenia   . Suicidal ideation   . Homicidal ideation   . Explosive personality disorder    AXIS IV:  housing problems, problems related to legal system/crime and problems related to social environment AXIS V:  61-70 mild symptoms  Assessment: No evidence of imminent risk to self or others at present.    Plan: 1. D/C patient out to return home. 2. Consider out patient commitment to increase compliance with medication. This can be done by his out patient provider. 3. Discuss proper use of ED services with patient.    Subjective:   [name] is a 23 y.o. male patient admitted with old  laceration to arm after just having been discharged from ED. This is his 27th ED visit in 6 months. He has been in the ED  15 x since his most recent discharge from [facility] on 10/09/2013.  On several days he has been discharged from the ED only to return.      His complaints are of SI/HI and auditory command voices. [name] states that he has been non compliant with is medication and has not followed his treatment plan to call his ACTT team when he is in distress, "because it's late at night and they have families."        He states that he is not getting the help he needs, which is having "a counselor who will listen to him 1 hour a day" which is different from his initial complaint to this provider which was "I need long term psychiatric hospitalization."        [name] gives an unconvincing report of  being disoriented to day and date, stating "it's the 29th and August, and I don't know what county this is, I just got here!"        He implies that "if I don't get the help I need, that it will be bad." When asked to clarify and help this provider understand his remarks, "I'm not saying I'd hurt anyone, that's not what I said, I said it would be bad, if somebody got hurt because I didn't get the help I need."         He goes on to say he would not hurt anyone "because that's not what I enjoy, I don't get that adrenaline rush that you can taste in your mouth." "It would just be bad, if I got hurt, no I don't have a plan to hurt myself, but I know how to do it." He then became argumentative with this provider and wanted to become verbally aggressive.       Patient goes on to show me his arm which is covered by a wrist brace, saying "I hurt myself..." but the ED provider's report indicates that this is an old wound, and not recent.         Patient then tries to negotiate his care saying that "I need a counselor to see me 1 hour a day, or to be in the  hospital" .  HPI:  Patient has a long history of mental illness and medical non-compliance who does not follow his treatment plan set up with ACTT support personnel. HPI Elements:   Location:  [facility]. Quality:  Chronic. Severity:  mild. Timing:  on going. Duration:  constant. Context:  patient continues to display poor coping skills for most problems, seeing the ED as his only option..  Past Psychiatric History: Past Medical History  Diagnosis Date  . Asthma   . Bipolar 1 disorder   . ODD (oppositional defiant disorder)   . ADHD (attention deficit hyperactivity disorder)   . Schizophrenia   . Suicidal ideation   . Homicidal ideation   . Explosive personality disorder     reports that he has never smoked. He does not have any smokeless tobacco history on file. He reports that he does not drink alcohol or use illicit drugs. History reviewed. No  pertinent family history. Allergies:   Allergies  Allergen Reactions  . Carbamazepine Anaphylaxis, Other (See Comments) and Cough    Cough up blood  . Geodon [Ziprasidone Hcl] Anaphylaxis and Other (See Comments)    " Causes my throat to close"  . Haldol [Haloperidol Lactate] Anaphylaxis and Other (See Comments)    Patient reports that he stopped taking this because of throat swelling.   [name] [Atomoxetine] Anaphylaxis, Other (See Comments) and Cough    Cough up blood  . Other Nausea Only and Other (See Comments)    Apple Juice, stomach hurts and itchy throat     ACT Assessment Complete:  Yes:    Educational Status    Risk to Self: Risk to self with the past 6 months Is patient at risk for suicide?: Yes Substance abuse history and/or treatment for substance abuse?: No  Risk to Others:    Abuse:    Prior Inpatient Therapy:    Prior Outpatient Therapy:    Additional Information:      Objective: Blood pressure 138/94, pulse 66, resp. rate 18, SpO2 100.00%.There is no weight on file to calculate BMI.No results found for this or any previous visit (from the past 72 hour(s)). Labs are reviewed and are pertinent for negative UDS, Normal Depakote level, AST returns to normal.  All other labs benign.  No current facility-administered medications for this encounter.   Current Outpatient Prescriptions  Medication Sig Dispense Refill  . albuterol (PROVENTIL HFA;VENTOLIN HFA) 108 (90 BASE) MCG/ACT inhaler Inhale 1 puff into the lungs every 6 (six) hours as needed for wheezing or shortness of breath.      . ARIPiprazole (ABILIFY MAINTENA) 400 MG SUSR Inject 400 mg into the muscle every 30 (thirty) days.      . benztropine (COGENTIN) 1 MG tablet Take 1 tablet (1 mg total) by mouth 2 (two) times daily.  60 tablet  0  . chlorproMAZINE (THORAZINE) 100 MG tablet Take 100 mg by mouth 2 (two) times daily.      . divalproex (DEPAKOTE ER) 500 MG 24 hr tablet Take 500 mg by mouth 2 (two) times  daily.      . traZODone (DESYREL) 50 MG tablet Take 50 mg by mouth at bedtime as needed for sleep.      . ARIPiprazole (ABILIFY) 10 MG tablet Take 1 tablet (10 mg total) by mouth daily.  14 tablet  0    Psychiatric Specialty Exam:     Blood pressure 138/94, pulse 66, resp. rate 18, SpO2 100.00%.There is no weight on file to calculate  BMI.  General Appearance: Casual  Eye Contact::  Good  Speech:  Clear and Coherent  Volume:  Increased  Mood:  Euthymic, patient does not appear depressed, smiles at times, and has spontaneous speech.   Affect:  Non-Congruent appears euthymic, but displays anger and threatening affect when he does not get what he desires  Thought Process:  Goal Directed  Orientation:  Other:  patient gives a very week "performance" of being disoriented which is not believable to this provider in the context of this evaluation  Thought Content:  Hallucinations: Command:  to hurt himself and others  Suicidal Thoughts:  Yes.  without intent/plan  Homicidal Thoughts:  No  Memory:  NA  Judgement:  Poor  Insight:  Shallow  Psychomotor Activity:  Normal  Concentration:  Good  Recall:  Fair  Akathisia:  No  Handed:    AIMS (if indicated):     Assets:  Housing Physical Health  Sleep:      Treatment Plan Summary: Medication Management Recommended out patient commitment to assist patient with compliance with medication. Consider legal action for continued abuse of ED services. Disposition: 1. D/C patient home to follow up with out patient provider. 2. Educate patient regarding proper use and abuse of ED services. 3. Discussed case with Dr. Lucianne Muss who agrees, will inform EDMD. Rona Ravens. Jericha Bryden RPAC 1:08 PM 10/28/2013

## 2013-10-28 NOTE — ED Notes (Signed)
MD, Act team at bedside.

## 2013-10-28 NOTE — ED Notes (Signed)
Pt. Requesting to sign himself out. Spoke with PA and MD and states they will discharge him.

## 2013-10-28 NOTE — BH Assessment (Signed)
[facility] Assessment Progress Note  Left message at [facility] for Verne Spurr to call  Dr. Littie Deeds at [phone].

## 2013-10-28 NOTE — Discharge Instructions (Signed)
Please follow up with your primary care physician in 1-2 days. If you do not have one please call the [facility] number listed above. Please follow up with your counselor at [facility] to schedule a follow up appointment. Please read all discharge instructions and return precautions.    Schizoaffective Disorder Schizoaffective disorder (ScAD) is a mental illness. It causes symptoms that are a mixture of schizophrenia (a psychotic disorder) and an affective (mood) disorder. The schizophrenic symptoms may include delusions, hallucinations, or odd behavior. The mood symptoms may be similar to major depression or bipolar disorder. ScAD may interfere with personal relationships or normal daily activities. People with ScAD are at increased risk for job loss, social isolation,physical health problems, anxiety and substance use disorders, and suicide. ScAD usually occurs in cycles. Periods of severe symptoms are followed by periods of less severe symptoms or improvement. The illness affects men and women equally but usually appears at an earlier age (teenage or early adult years) in men. People who have family members with schizophrenia, bipolar disorder, or ScAD are at higher risk of developing ScAD. SYMPTOMS  At any one time, people with ScAD may have psychotic symptoms only or both psychotic and mood symptoms. The psychotic symptoms include one or more of the following:  Hearing, seeing, or feeling things that are not there (hallucinations).   Having fixed, false beliefs (delusions). The delusions usually are of being attacked, harassed, cheated, persecuted, or conspired against (paranoid delusions).  Speaking in a way that makes no sense to others (disorganized speech). The psychotic symptoms of ScAD may also include confusing or odd behavior or any of the negative symptoms of schizophrenia. These include loss of motivation for normal daily activities, such as bathing or  grooming, withdrawal from other people, and lack of emotions.  The mood symptoms of ScAD occur more often than not. They resemble major depressive disorder or bipolar mania. Symptoms of major depression include depressed mood and four or more of the following:  Loss of interest in usually pleasurable activities (anhedonia).  Sleeping more or less than normal.  Feeling worthless or excessively guilty.  Lack of energy or motivation.  Trouble concentrating.  Eating more or less than usual.  Thinking a lot about death or suicide. Symptoms of bipolar mania include abnormally elevated or irritable mood and increased energy or activity, plus three or more of the following:   More confidence than normal or feeling that you are able to do anything (grandiosity).  Feeling rested with less sleep than normal.   Being easily distracted.   Talking more than usual or feeling pressured to keep talking.   Feeling that your thoughts are racing.  Engaging in high-risk activities such as buying sprees or foolish business decisions. DIAGNOSIS  ScAD is diagnosed through an assessment by your health care provider. Your health care provider will observe and ask questions about your thoughts, behavior, mood, and ability to function in daily life. Your health care provider may also ask questions about your medical history and use of drugs, including prescription medicines. Your health care provider may also order blood tests and imaging exams. Certain medical conditions and substances can cause symptoms that resemble ScAD. Your health care provider may refer you to a mental health specialist for evaluation.  ScAD is divided into two types. The depressive type is diagnosed if your mood symptoms are limited to major depression. The bipolar type is diagnosed if your mood symptoms are manic or a mixture of manic  and depressive symptoms TREATMENT  ScAD is usually a lifelong illness. Long-term treatment is  necessary. The following treatments are available:  Medicine. Different types of medicine are used to treat ScAD. The exact combination depends on the type and severity of your symptoms. Antipsychotic medicine is used to control psychotic symptomssuch as delusions, paranoia, and hallucinations. Mood stabilizers can even the highs and lows of bipolar manic mood swings. Antidepressant medicines are used to treat major depressive symptoms.  Counseling or talk therapy. Individual, group, or family counseling may be helpful in providing education, support, and guidance. Many people with ScAD also benefit from social skills and job skills (vocational) training. A combination of medicine and counseling is usually best for managing the disorder over time. A procedure in which electricity is applied to the brain through the scalp (electroconvulsive therapy) may be used to treat people with severe manic symptoms that do not respond to medicine and counseling. HOME CARE INSTRUCTIONS   Take all your medicine as prescribed.  Check with your health care provider before starting new prescription or over-the-counter medicines.  Keep all follow up appointments with your health care provider. SEEK MEDICAL CARE IF:   If you are not able to take your medicines as prescribed.  If your symptoms get worse. SEEK IMMEDIATE MEDICAL CARE IF:   You have serious thoughts about hurting yourself or others. Document Released: 06/25/2006 Document Revised: 06/29/2013 Document Reviewed: 09/26/2012 [facility] Patient Information 2015 [place], [place]. This information is not intended to replace advice given to you by your health care provider. Make sure you discuss any questions you have with your health care provider.

## 2013-10-28 NOTE — ED Notes (Signed)
Spoke with pt mother. [phone]) and she states that she is aware the patient comes to he ed almost daily. She states that he has been expressing thoughts to his younger brother and to her that the "wonders what will happen if i cut myself" . States he sometimes just sits in the dark. States he is not consistantly compliant with his medications. She states she is supposed to have a meeting with the act team (was supposed to be today at 11 am), states she is going to call zola with act and try to reschedule for later this afternoon. She is to call when she knows what time she will be picking up [name] .

## 2013-10-28 NOTE — ED Notes (Signed)
Patient was given paper scrubs and belongings collected. Patient wanded by security at [phone]. Sitter at bedside along with [employer].

## 2013-10-28 NOTE — ED Notes (Signed)
Pt. States "Just because I said I had these thoughts doesn't mean I'm going to hurt someone".

## 2013-10-28 NOTE — ED Provider Notes (Signed)
  This was a shared visit with a mid-level provided (NP or PA).  Throughout the patient's course I was available for consultation/collaboration.  I saw the ECG (if appropriate), relevant labs and studies - I agree with the interpretation.  On my exam the patient was in no distress.  He was ambulatory, awake and alert.  He departed prior and was encouraged to follow up with his psych team in the AM, and to take his prescribed meds.       Gerhard Munch, MD 10/28/13 [phone]

## 2013-10-28 NOTE — ED Notes (Signed)
Spoke with house coverage. [name] stated he contacted staffing to send a sitter down for patient.

## 2013-10-28 NOTE — ED Provider Notes (Signed)
CSN: [id number]     Arrival date & time 10/28/13  1947 History   First MD Initiated Contact with Patient 10/28/13 2010     No chief complaint on file.    (Consider location/radiation/quality/duration/timing/severity/associated sxs/prior Treatment) HPI Comments: The patient is a 23 year old male well-known to the emergency department because of visits 2 times per day for the last week and frequent visits before that. He is known to have underlying psychiatric disorder as well as a habit of malingering. He was already seen and discharged from the hospital after a stay over the last 12-24 hours, seen by psychiatry and found to be malingering and in stable condition not a danger to himself. When he went home he went to get drunk, came home and told his father that he was to cut his arm with a knife, he never hurt himself, he has multiple threats to hurt himself but never does, he frequently presents to the hospital dressed in his nicest outfits with a big smile on his face stating "Im going to hurt myslef"  ; he also is quick to say that he has been chronically hospitalized since the age of 44 and does cannot live by himself.  The history is provided by the patient, a relative and medical records.    Past Medical History  Diagnosis Date  . Asthma   . Bipolar 1 disorder   . ODD (oppositional defiant disorder)   . ADHD (attention deficit hyperactivity disorder)   . Schizophrenia   . Suicidal ideation   . Homicidal ideation   . Explosive personality disorder    No past surgical history on file. No family history on file. History  Substance Use Topics  . Smoking status: Never Smoker   . Smokeless tobacco: Not on file  . Alcohol Use: No     Comment: Patient denies    Review of Systems  All other systems reviewed and are negative.     Allergies  Carbamazepine; Geodon; Haldol; Strattera; and Other  Home Medications   Prior to Admission medications   Medication Sig Start Date End Date  Taking? Authorizing Provider  albuterol (PROVENTIL HFA;VENTOLIN HFA) 108 (90 BASE) MCG/ACT inhaler Inhale 1 puff into the lungs every 6 (six) hours as needed for wheezing or shortness of breath.    Historical Provider, MD  ARIPiprazole (ABILIFY MAINTENA) 400 MG SUSR Inject 400 mg into the muscle every 30 (thirty) days.    Historical Provider, MD  ARIPiprazole (ABILIFY) 10 MG tablet Take by mouth daily.    Shuvon Rankin, NP  benztropine (COGENTIN) 1 MG tablet Take 1 tablet (1 mg total) by mouth 2 (two) times daily. 10/13/13   Beau Fanny, FNP  chlorproMAZINE (THORAZINE) 100 MG tablet Take 100 mg by mouth 2 (two) times daily.    Historical Provider, MD  divalproex (DEPAKOTE ER) 500 MG 24 hr tablet Take 500 mg by mouth 2 (two) times daily.    Historical Provider, MD  traZODone (DESYREL) 50 MG tablet Take 50 mg by mouth at bedtime as needed for sleep.    Historical Provider, MD   There were no vitals taken for this visit. Physical Exam  Nursing note and vitals reviewed. Constitutional: He appears well-developed and well-nourished.  HENT:  Head: Normocephalic and atraumatic.  Eyes: Conjunctivae are normal. Right eye exhibits no discharge. Left eye exhibits no discharge.  Pulmonary/Chest: Effort normal. No respiratory distress.  Neurological: He is alert. Coordination normal.  Skin: Skin is warm and dry. No rash noted.  He is not diaphoretic. No erythema.  Psychiatric:  On affect, smiling, not responding to internal stimuli    ED Course  Procedures (including critical care time) Labs Review Labs Reviewed - No data to display  Imaging Review No results found.   EKG Interpretation None      MDM   Final diagnoses:  Malingering    The patient is at his bizarre baseline, he does not appear to be a danger to himself or others, the father states that he is not a danger to others and never makes threats against others. He never attempt to hurt himself though he verbally says that he  will. He does not appear to be any decompensated compared to prior. The patient will be discharged home.    Vida Roller, MD 10/28/13 2014

## 2013-10-28 NOTE — ED Notes (Signed)
Patient is from home with suicidal ideations.  Patient cut left forearm with knife.  Bleeding controlled.  Patient is having hallucinations, see people who are not there. Brother called EMS for safety of patient.  Patient here with [employer] and EMS.

## 2013-10-28 NOTE — ED Notes (Signed)
[employer] and sitter at bedside with patient.

## 2013-10-28 NOTE — ED Notes (Signed)
Left msg with Darrick Penna , pt act

## 2013-10-28 NOTE — ED Provider Notes (Signed)
Assumed care of pt from Dr. Ranae Palms.  Briefly, pt is a 23 y.o. male well known to the ED for schizoaffective do, suicidality, HI, and malingering with misuse of hospital resources.  On my assumption of care the patient was awaiting telepsych consultation. I contacted the patient's act team who came and spoke with the patient.  Psychiatry, after speaking with the supervising physician, felt that the patient was not at risk of harm to himself or others felt him stable to be discharged home. The patient's act team was initially uncomfortable with discharging patient home as they are unfamiliar with his care and with his presenting complaints.  Throughout the patient's stay here he persistently change story and would alternate between endorsing homicidal and suicidal ideations.  On my examination after I discussed the patient's course of care he recanted both of his complaints.  As per protocol with psychiatry I rescinded the patient's IVC per their recommendation. The patient was discharged uneventfully.   Mirian Mo, MD 10/28/13 [phone]

## 2013-10-28 NOTE — ED Notes (Signed)
tts in progress 

## 2013-10-28 NOTE — Discharge Instructions (Signed)
Schizoaffective Disorder Schizoaffective disorder (ScAD) is a mental illness. It causes symptoms that are a mixture of schizophrenia (a psychotic disorder) and an affective (mood) disorder. The schizophrenic symptoms may include delusions, hallucinations, or odd behavior. The mood symptoms may be similar to major depression or bipolar disorder. ScAD may interfere with personal relationships or normal daily activities. People with ScAD are at increased risk for job loss, social isolation,physical health problems, anxiety and substance use disorders, and suicide. ScAD usually occurs in cycles. Periods of severe symptoms are followed by periods of less severe symptoms or improvement. The illness affects men and women equally but usually appears at an earlier age (teenage or early adult years) in men. People who have family members with schizophrenia, bipolar disorder, or ScAD are at higher risk of developing ScAD. SYMPTOMS  At any one time, people with ScAD may have psychotic symptoms only or both psychotic and mood symptoms. The psychotic symptoms include one or more of the following:  Hearing, seeing, or feeling things that are not there (hallucinations).   Having fixed, false beliefs (delusions). The delusions usually are of being attacked, harassed, cheated, persecuted, or conspired against (paranoid delusions).  Speaking in a way that makes no sense to others (disorganized speech). The psychotic symptoms of ScAD may also include confusing or odd behavior or any of the negative symptoms of schizophrenia. These include loss of motivation for normal daily activities, such as bathing or grooming, withdrawal from other people, and lack of emotions.  The mood symptoms of ScAD occur more often than not. They resemble major depressive disorder or bipolar mania. Symptoms of major depression include depressed mood and four or more of the following:  Loss of interest in usually pleasurable activities  (anhedonia).  Sleeping more or less than normal.  Feeling worthless or excessively guilty.  Lack of energy or motivation.  Trouble concentrating.  Eating more or less than usual.  Thinking a lot about death or suicide. Symptoms of bipolar mania include abnormally elevated or irritable mood and increased energy or activity, plus three or more of the following:   More confidence than normal or feeling that you are able to do anything (grandiosity).  Feeling rested with less sleep than normal.   Being easily distracted.   Talking more than usual or feeling pressured to keep talking.   Feeling that your thoughts are racing.  Engaging in high-risk activities such as buying sprees or foolish business decisions. DIAGNOSIS  ScAD is diagnosed through an assessment by your health care provider. Your health care provider will observe and ask questions about your thoughts, behavior, mood, and ability to function in daily life. Your health care provider may also ask questions about your medical history and use of drugs, including prescription medicines. Your health care provider may also order blood tests and imaging exams. Certain medical conditions and substances can cause symptoms that resemble ScAD. Your health care provider may refer you to a mental health specialist for evaluation.  ScAD is divided into two types. The depressive type is diagnosed if your mood symptoms are limited to major depression. The bipolar type is diagnosed if your mood symptoms are manic or a mixture of manic and depressive symptoms TREATMENT  ScAD is usually a lifelong illness. Long-term treatment is necessary. The following treatments are available:  Medicine. Different types of medicine are used to treat ScAD. The exact combination depends on the type and severity of your symptoms. Antipsychotic medicine is used to control psychotic symptomssuch as delusions, paranoia,   and hallucinations. Mood stabilizers can  even the highs and lows of bipolar manic mood swings. Antidepressant medicines are used to treat major depressive symptoms.  Counseling or talk therapy. Individual, group, or family counseling may be helpful in providing education, support, and guidance. Many people with ScAD also benefit from social skills and job skills (vocational) training. A combination of medicine and counseling is usually best for managing the disorder over time. A procedure in which electricity is applied to the brain through the scalp (electroconvulsive therapy) may be used to treat people with severe manic symptoms that do not respond to medicine and counseling. HOME CARE INSTRUCTIONS   Take all your medicine as prescribed.  Check with your health care provider before starting new prescription or over-the-counter medicines.  Keep all follow up appointments with your health care provider. SEEK MEDICAL CARE IF:   If you are not able to take your medicines as prescribed.  If your symptoms get worse. SEEK IMMEDIATE MEDICAL CARE IF:   You have serious thoughts about hurting yourself or others. Document Released: 06/25/2006 Document Revised: 06/29/2013 Document Reviewed: 09/26/2012 Exit[employer] Patient Information 2015 [employer]. This information is not intended to replace advice given to you by your health care provider. Make sure you discuss any questions you have with your health care provider.  

## 2013-10-28 NOTE — ED Notes (Signed)
Attempted to call pt mother again to see when or if she will be coming to pick up pt and take him home. He has denied feeling si or hi. States he is fine and wants to just go home.

## 2013-10-28 NOTE — ED Notes (Signed)
Pt. Refused paperwork and refused to sign for discharge. Pt. Ambulatory on the way out, escorted by police.

## 2013-10-29 NOTE — Consult Note (Signed)
Case discussed, and agree with plan. Patient well known to the service, has a act team and needs to followup with them

## 2013-10-30 ENCOUNTER — Encounter (HOSPITAL_COMMUNITY): Payer: Self-pay | Admitting: Emergency Medicine

## 2013-10-30 ENCOUNTER — Emergency Department (HOSPITAL_COMMUNITY)
Admission: EM | Admit: 2013-10-30 | Discharge: 2013-10-30 | Disposition: A | Discharge status: Home / Self Care | Payer: Medicaid Other | Attending: Emergency Medicine | Admitting: Emergency Medicine

## 2013-10-30 DIAGNOSIS — T438X2A Poisoning by other psychotropic drugs, intentional self-harm, initial encounter: Secondary | ICD-10-CM | POA: Insufficient documentation

## 2013-10-30 DIAGNOSIS — T450X4A Poisoning by antiallergic and antiemetic drugs, undetermined, initial encounter: Secondary | ICD-10-CM | POA: Insufficient documentation

## 2013-10-30 DIAGNOSIS — T50992A Poisoning by other drugs, medicaments and biological substances, intentional self-harm, initial encounter: Secondary | ICD-10-CM | POA: Insufficient documentation

## 2013-10-30 DIAGNOSIS — F209 Schizophrenia, unspecified: Secondary | ICD-10-CM | POA: Diagnosis not present

## 2013-10-30 DIAGNOSIS — R443 Hallucinations, unspecified: Secondary | ICD-10-CM | POA: Insufficient documentation

## 2013-10-30 DIAGNOSIS — J45909 Unspecified asthma, uncomplicated: Secondary | ICD-10-CM | POA: Diagnosis not present

## 2013-10-30 DIAGNOSIS — T43502A Poisoning by unspecified antipsychotics and neuroleptics, intentional self-harm, initial encounter: Secondary | ICD-10-CM | POA: Diagnosis not present

## 2013-10-30 DIAGNOSIS — T43224A Poisoning by selective serotonin reuptake inhibitors, undetermined, initial encounter: Secondary | ICD-10-CM | POA: Insufficient documentation

## 2013-10-30 DIAGNOSIS — Z79899 Other long term (current) drug therapy: Secondary | ICD-10-CM | POA: Diagnosis not present

## 2013-10-30 DIAGNOSIS — T50902A Poisoning by unspecified drugs, medicaments and biological substances, intentional self-harm, initial encounter: Secondary | ICD-10-CM

## 2013-10-30 DIAGNOSIS — F319 Bipolar disorder, unspecified: Secondary | ICD-10-CM | POA: Insufficient documentation

## 2013-10-30 LAB — URINALYSIS, ROUTINE W REFLEX MICROSCOPIC
Bilirubin Urine: NEGATIVE
Glucose, UA: NEGATIVE mg/dL
Hgb urine dipstick: NEGATIVE
Ketones, ur: NEGATIVE mg/dL
Leukocytes, UA: NEGATIVE
Nitrite: NEGATIVE
Protein, ur: NEGATIVE mg/dL
Specific Gravity, Urine: 1.012 (ref 1.005–1.030)
Urobilinogen, UA: 0.2 mg/dL (ref 0.0–1.0)
pH: 6 (ref 5.0–8.0)

## 2013-10-30 LAB — CBC WITH DIFFERENTIAL/PLATELET
Basophils Absolute: 0 10*3/uL (ref 0.0–0.1)
Basophils Relative: 0 % (ref 0–1)
Eosinophils Absolute: 0.1 10*3/uL (ref 0.0–0.7)
Eosinophils Relative: 2 % (ref 0–5)
HCT: 40.7 % (ref 39.0–52.0)
Hemoglobin: 14.1 g/dL (ref 13.0–17.0)
Lymphocytes Relative: 35 % (ref 12–46)
Lymphs Abs: 2 10*3/uL (ref 0.7–4.0)
MCH: 27.1 pg (ref 26.0–34.0)
MCHC: 34.6 g/dL (ref 30.0–36.0)
MCV: 78.1 fL (ref 78.0–100.0)
Monocytes Absolute: 0.6 10*3/uL (ref 0.1–1.0)
Monocytes Relative: 11 % (ref 3–12)
Neutro Abs: 2.9 10*3/uL (ref 1.7–7.7)
Neutrophils Relative %: 52 % (ref 43–77)
Platelets: 267 10*3/uL (ref 150–400)
RBC: 5.21 MIL/uL (ref 4.22–5.81)
RDW: 13.1 % (ref 11.5–15.5)
WBC: 5.6 10*3/uL (ref 4.0–10.5)

## 2013-10-30 LAB — ACETAMINOPHEN LEVEL: Acetaminophen (Tylenol), Serum: <15 ug/mL (ref 10–30)

## 2013-10-30 LAB — BASIC METABOLIC PANEL
Anion gap: 13 (ref 5–15)
BUN: 8 mg/dL (ref 6–23)
CO2: 24 mEq/L (ref 19–32)
Calcium: 8.9 mg/dL (ref 8.4–10.5)
Chloride: 101 mEq/L (ref 96–112)
Creatinine, Ser: 0.8 mg/dL (ref 0.50–1.35)
GFR calc Af Amer: >90 mL/min (ref >90)
GFR calc non Af Amer: >90 mL/min (ref >90)
Glucose, Bld: 96 mg/dL (ref 70–99)
Potassium: 3.7 mEq/L (ref 3.7–5.3)
Sodium: 138 mEq/L (ref 137–147)

## 2013-10-30 LAB — ETHANOL: Alcohol, Ethyl (B): <11 mg/dL (ref 0–11)

## 2013-10-30 LAB — SALICYLATE LEVEL: Salicylate Lvl: <2 mg/dL — ABNORMAL LOW (ref 2.8–20.0)

## 2013-10-30 MED ORDER — CHLORPROMAZINE HCL 25 MG PO TABS
100.0000 mg | ORAL_TABLET | Freq: Two times a day (BID) | ORAL | Status: DC
  Administered 2013-10-30: 100 mg via ORAL
  Filled 2013-10-30: qty 4

## 2013-10-30 MED ORDER — TRAZODONE HCL 50 MG PO TABS: 50.0000 mg | ORAL_TABLET | Freq: Every evening | ORAL | Status: DC | PRN

## 2013-10-30 MED ORDER — BENZTROPINE MESYLATE 1 MG PO TABS
1.0000 mg | ORAL_TABLET | Freq: Two times a day (BID) | ORAL | Status: DC
  Administered 2013-10-30: 1 mg via ORAL
  Filled 2013-10-30: qty 1

## 2013-10-30 MED ORDER — ALBUTEROL SULFATE HFA 108 (90 BASE) MCG/ACT IN AERS: 1.0000 | INHALATION_SPRAY | Freq: Four times a day (QID) | RESPIRATORY_TRACT | Status: DC | PRN

## 2013-10-30 MED ORDER — DIVALPROEX SODIUM ER 500 MG PO TB24
500.0000 mg | ORAL_TABLET | Freq: Two times a day (BID) | ORAL | Status: DC
  Administered 2013-10-30: 500 mg via ORAL
  Filled 2013-10-30 (×3): qty 1

## 2013-10-30 NOTE — ED Notes (Signed)
Left message with Kris Hartmann r/t transportation/plan

## 2013-10-30 NOTE — ED Notes (Signed)
EKG given to EDP,Kohut,MD., for review. 

## 2013-10-30 NOTE — ED Notes (Signed)
Per Kris Hartmann at [place] [phone], they will try and arrange transportation for patient-states he is inappropriate with male staff as well as aggressive and they are not able to come get him at this time-informed Kathlene November that patient was discharged and with regards to treatment plan, they are responsible for transporting him to home/appropriate facility

## 2013-10-30 NOTE — ED Notes (Signed)
Message left with ACT Team lead Zola at this time.

## 2013-10-30 NOTE — ED Notes (Signed)
Per EMS pt took 4 Prozac and 4 Benadryl tablets, states he's hearing voices to hurt himself, appear to be real to pt. Pt had a fall last week, states his L hand to his L upper arm hurts.

## 2013-10-30 NOTE — ED Notes (Signed)
Pt also complaining of sharp abdominal pain for EMS

## 2013-10-30 NOTE — ED Notes (Signed)
Spoke with Galen Daft from ACTT at [place] at this time the team is meeting to discuss plan for patient. She will call back once plan is in place.

## 2013-10-30 NOTE — ED Provider Notes (Signed)
CSN: [id number]     Arrival date & time 10/30/13  0003 History   First MD Initiated Contact with Patient 10/30/13 0009     Chief Complaint  Patient presents with  . Hallucinations     (Consider location/radiation/quality/duration/timing/severity/associated sxs/prior Treatment) HPI  23yM with intentional overdose. Says he took four tablets of benadryl ( ?) and four tablets of prozac of unknown strength. Reports hearing voices telling him to harm himself. Took them just before arrival. Denies specific acute stressor. Pt with extensive psych hx. Also recently seen in ED on numerous occasions. Says he tried contacting mobile crisis worker but no one answered. Denies any other ingestion. Denies drug use. No command hallucinations to harm anyone else.     Past Medical History  Diagnosis Date  . Asthma   . Bipolar 1 disorder   . ODD (oppositional defiant disorder)   . ADHD (attention deficit hyperactivity disorder)   . Schizophrenia   . Suicidal ideation   . Homicidal ideation   . Explosive personality disorder    History reviewed. No pertinent past surgical history. No family history on file. History  Substance Use Topics  . Smoking status: Never Smoker   . Smokeless tobacco: Not on file  . Alcohol Use: No     Comment: Patient denies    Review of Systems  All systems reviewed and negative, other than as noted in HPI.   Allergies  Carbamazepine; Geodon; Haldol; Strattera; and Other  Home Medications   Prior to Admission medications   Medication Sig Start Date End Date Taking? Authorizing Provider  albuterol (PROVENTIL HFA;VENTOLIN HFA) 108 (90 BASE) MCG/ACT inhaler Inhale 1 puff into the lungs every 6 (six) hours as needed for wheezing or shortness of breath.   Yes Historical Provider, MD  ARIPiprazole (ABILIFY MAINTENA) 400 MG SUSR Inject 400 mg into the muscle every 30 (thirty) days.   Yes Historical Provider, MD  ARIPiprazole (ABILIFY) 10 MG tablet Take by mouth daily.    Yes Shuvon Rankin, NP  benztropine (COGENTIN) 1 MG tablet Take 1 tablet (1 mg total) by mouth 2 (two) times daily. 10/13/13  Yes Beau Fanny, FNP  chlorproMAZINE (THORAZINE) 100 MG tablet Take 100 mg by mouth 2 (two) times daily.   Yes Historical Provider, MD  divalproex (DEPAKOTE ER) 500 MG 24 hr tablet Take 500 mg by mouth 2 (two) times daily.   Yes Historical Provider, MD  traZODone (DESYREL) 50 MG tablet Take 50 mg by mouth at bedtime as needed for sleep.   Yes Historical Provider, MD   BP 149/87  Pulse 96  Temp(Src) 99.2 F (37.3 C) (Oral)  Resp 18  SpO2 97% Physical Exam  Nursing note and vitals reviewed. Constitutional: He appears well-developed and well-nourished. No distress.  HENT:  Head: Normocephalic and atraumatic.  Eyes: Conjunctivae are normal. Pupils are equal, round, and reactive to light. Right eye exhibits no discharge. Left eye exhibits no discharge.  Neck: Neck supple.  Cardiovascular: Normal rate, regular rhythm and normal heart sounds.  Exam reveals no gallop and no friction rub.   No murmur heard. Pulmonary/Chest: Effort normal and breath sounds normal. No respiratory distress.  Abdominal: Soft. He exhibits no distension. There is no tenderness.  Musculoskeletal: He exhibits no edema and no tenderness.  Neurological: He is alert.  Skin: Skin is warm and dry.  Psychiatric:  Speech clear. Does not appear to me to be responding to internal stimuli at this time. Insight is poor.     ED  Course  Procedures (including critical care time) Labs Review Labs Reviewed  SALICYLATE LEVEL - Abnormal; Notable for the following:    Salicylate Lvl <2.0 (*)    All other components within normal limits  CBC WITH DIFFERENTIAL  BASIC METABOLIC PANEL  ACETAMINOPHEN LEVEL  URINALYSIS, ROUTINE W REFLEX MICROSCOPIC  ETHANOL    Imaging Review No results found.   EKG Interpretation   Date/Time:  Friday October 30 2013 01:17:26 EDT Ventricular Rate:  88 PR  Interval:  144 QRS Duration: 91 QT Interval:  343 QTC Calculation: 415 R Axis:   48 Text Interpretation:  Sinus rhythm has not changed Confirmed by Juleen China  MD,  Kerem Gilmer ([id number]) on 10/30/2013 3:02:09 AM      MDM   Final diagnoses:  Drug overdose, intentional, initial encounter    23yM with intentional overdose.  Pt essentially gives me same reasons for ingestion as he has had with several other recent ED visits. When discussing more with pt he has expectation that he either needs to be meeting with a counselor on a daily basis or be be a facility. I can't reason with him and attempts to only make him defensive/argumentative. He has a care plan in place, but ingestion necessitates work-up.  Neither medication should pose significant risk in amounts reported. As precaution though, will screen and observe.    Raeford Razor, MD 10/30/13 [phone]

## 2013-10-30 NOTE — ED Notes (Signed)
Patient ambulating to restroom at this time. Patient requesting breakfast explained to patient that trays will be around in little while.

## 2013-10-30 NOTE — ED Notes (Signed)
Contacted Jeanette at [employer] she will contact her supervisor and call us back about a plan for patient

## 2013-10-30 NOTE — ED Notes (Signed)
Bed: [facility] Expected date:  Expected time:  Means of arrival:  Comments: Took 4 benadryl and 4 prozac/IVC

## 2013-10-31 ENCOUNTER — Emergency Department (HOSPITAL_COMMUNITY)
Admission: EM | Admit: 2013-10-31 | Discharge: 2013-11-01 | Disposition: A | Discharge status: Home / Self Care | Payer: Medicaid Other | Attending: Emergency Medicine | Admitting: Emergency Medicine

## 2013-10-31 ENCOUNTER — Encounter (HOSPITAL_COMMUNITY): Payer: Self-pay | Admitting: Emergency Medicine

## 2013-10-31 DIAGNOSIS — R443 Hallucinations, unspecified: Secondary | ICD-10-CM | POA: Diagnosis present

## 2013-10-31 DIAGNOSIS — R45850 Homicidal ideations: Secondary | ICD-10-CM | POA: Diagnosis not present

## 2013-10-31 DIAGNOSIS — F319 Bipolar disorder, unspecified: Secondary | ICD-10-CM | POA: Diagnosis not present

## 2013-10-31 DIAGNOSIS — J45909 Unspecified asthma, uncomplicated: Secondary | ICD-10-CM | POA: Diagnosis not present

## 2013-10-31 DIAGNOSIS — Z79899 Other long term (current) drug therapy: Secondary | ICD-10-CM | POA: Diagnosis not present

## 2013-10-31 DIAGNOSIS — R45851 Suicidal ideations: Secondary | ICD-10-CM | POA: Insufficient documentation

## 2013-10-31 DIAGNOSIS — F259 Schizoaffective disorder, unspecified: Secondary | ICD-10-CM | POA: Diagnosis present

## 2013-10-31 LAB — RAPID URINE DRUG SCREEN, HOSP PERFORMED
Amphetamines: NOT DETECTED
BENZODIAZEPINES: NOT DETECTED
Barbiturates: NOT DETECTED
COCAINE: NOT DETECTED
Opiates: NOT DETECTED
Tetrahydrocannabinol: NOT DETECTED

## 2013-10-31 LAB — COMPREHENSIVE METABOLIC PANEL
ALBUMIN: 4 g/dL (ref 3.5–5.2)
ALT: 18 U/L (ref 0–53)
ANION GAP: 12 (ref 5–15)
AST: 18 U/L (ref 0–37)
Alkaline Phosphatase: 73 U/L (ref 39–117)
BUN: 6 mg/dL (ref 6–23)
CO2: 27 mEq/L (ref 19–32)
CREATININE: 0.89 mg/dL (ref 0.50–1.35)
Calcium: 9.4 mg/dL (ref 8.4–10.5)
Chloride: 101 mEq/L (ref 96–112)
GFR calc Af Amer: >90 mL/min (ref >90)
GFR calc non Af Amer: >90 mL/min (ref >90)
Glucose, Bld: 94 mg/dL (ref 70–99)
Potassium: 4.4 mEq/L (ref 3.7–5.3)
Sodium: 140 mEq/L (ref 137–147)
Total Bilirubin: <0.2 mg/dL — ABNORMAL LOW (ref 0.3–1.2)
Total Protein: 7.9 g/dL (ref 6.0–8.3)

## 2013-10-31 LAB — ETHANOL

## 2013-10-31 LAB — CBC
HCT: 43.2 % (ref 39.0–52.0)
Hemoglobin: 14.7 g/dL (ref 13.0–17.0)
MCH: 27 pg (ref 26.0–34.0)
MCHC: 34 g/dL (ref 30.0–36.0)
MCV: 79.4 fL (ref 78.0–100.0)
Platelets: 285 10*3/uL (ref 150–400)
RBC: 5.44 MIL/uL (ref 4.22–5.81)
RDW: 13.2 % (ref 11.5–15.5)
WBC: 5.4 10*3/uL (ref 4.0–10.5)

## 2013-10-31 MED ORDER — CHLORPROMAZINE HCL 25 MG PO TABS
100.0000 mg | ORAL_TABLET | Freq: Two times a day (BID) | ORAL | Status: DC
  Administered 2013-11-01 (×2): 100 mg via ORAL
  Filled 2013-10-31 (×2): qty 4

## 2013-10-31 MED ORDER — DIVALPROEX SODIUM ER 500 MG PO TB24
500.0000 mg | ORAL_TABLET | Freq: Two times a day (BID) | ORAL | Status: DC
  Administered 2013-11-01 (×2): 500 mg via ORAL
  Filled 2013-10-31 (×3): qty 1

## 2013-10-31 MED ORDER — ARIPIPRAZOLE 10 MG PO TABS
10.0000 mg | ORAL_TABLET | Freq: Every day | ORAL | Status: DC
  Administered 2013-11-01: 10 mg via ORAL
  Filled 2013-10-31: qty 1

## 2013-10-31 MED ORDER — TRAZODONE HCL 50 MG PO TABS: 50.0000 mg | ORAL_TABLET | Freq: Every evening | ORAL | Status: DC | PRN

## 2013-10-31 MED ORDER — BENZTROPINE MESYLATE 1 MG PO TABS
1.0000 mg | ORAL_TABLET | Freq: Two times a day (BID) | ORAL | Status: DC
  Administered 2013-11-01 (×2): 1 mg via ORAL
  Filled 2013-10-31 (×2): qty 1

## 2013-10-31 MED ORDER — ALBUTEROL SULFATE HFA 108 (90 BASE) MCG/ACT IN AERS: 1.0000 | INHALATION_SPRAY | Freq: Four times a day (QID) | RESPIRATORY_TRACT | Status: DC | PRN

## 2013-10-31 MED ORDER — ONDANSETRON HCL 4 MG PO TABS: 4.0000 mg | ORAL_TABLET | Freq: Three times a day (TID) | ORAL | Status: DC | PRN

## 2013-10-31 NOTE — ED Provider Notes (Signed)
CSN: [id number]     Arrival date & time 10/31/13  2030 History   First MD Initiated Contact with Patient 10/31/13 2110    This chart was scribed for nurse practitioner Earley Favor, NP working with Ethelda Chick, MD, by Andrew Au, ED Scribe. This patient was seen in room WTR4/WLPT4 and the patient's care was started at 9:10 PM. Chief Complaint  Patient presents with  . Suicidal  . Hallucinations   HPI Comments: Patient states he called [facility] this evening and "fired them" he no longer wants then to assist him as they have not helped  Patient has a plan to kill himself  With pills if he could find them but can't so plans on hanging himself if he can find a high enough place to jump of of   The history is provided by the patient. No language interpreter was used.   [name] is a 23 y.o. male who presents to the Emergency Department complaining of suicidal ideation . Pt states he is hearing voices named kayla and joshua. He reprots the voices are in the room with him and are telling him to take all of his pills. Pt states he tried to overdose on pills 2 days ago while at home. He states he took benadryl and allergy pills taking a total of 9 pills.  Pt was seen but was released yesterday. Pt states he needs long term behavioral care. Pt brought himself to be seen but was advised him to come. He reports he lives with his parents.    Past Medical History  Diagnosis Date  . Asthma   . Bipolar 1 disorder   . ODD (oppositional defiant disorder)   . ADHD (attention deficit hyperactivity disorder)   . Schizophrenia   . Suicidal ideation   . Homicidal ideation   . Explosive personality disorder    History reviewed. No pertinent past surgical history. History reviewed. No pertinent family history. History  Substance Use Topics  . Smoking status: Never Smoker   . Smokeless tobacco: Not on file  . Alcohol Use: Yes     Comment: Patient denies    Review of Systems  Neurological: Negative  for headaches.  Psychiatric/Behavioral: Positive for suicidal ideas and hallucinations.  All other systems reviewed and are negative.  Allergies  Carbamazepine; Geodon; Haldol; Strattera; and Other  Home Medications   Prior to Admission medications   Medication Sig Start Date End Date Taking? Authorizing Provider  albuterol (PROVENTIL HFA;VENTOLIN HFA) 108 (90 BASE) MCG/ACT inhaler Inhale 1 puff into the lungs every 6 (six) hours as needed for wheezing or shortness of breath.    Historical Provider, MD  ARIPiprazole (ABILIFY MAINTENA) 400 MG SUSR Inject 400 mg into the muscle every 30 (thirty) days.    Historical Provider, MD  ARIPiprazole (ABILIFY) 10 MG tablet Take by mouth daily.    Shuvon Rankin, NP  benztropine (COGENTIN) 1 MG tablet Take 1 tablet (1 mg total) by mouth 2 (two) times daily. 10/13/13   Beau Fanny, FNP  chlorproMAZINE (THORAZINE) 100 MG tablet Take 100 mg by mouth 2 (two) times daily.    Historical Provider, MD  divalproex (DEPAKOTE ER) 500 MG 24 hr tablet Take 500 mg by mouth 2 (two) times daily.    Historical Provider, MD  traZODone (DESYREL) 50 MG tablet Take 50 mg by mouth at bedtime as needed for sleep.    Historical Provider, MD   BP 134/85  Pulse 99  Temp(Src) 98.5 F (36.9  C) (Oral)  Resp 20  Ht  (1.88 m)  Wt 270 lb (122.471 kg)  BMI 34.65 kg/m2  SpO2 100% Physical Exam  Nursing note and vitals reviewed. Constitutional: He is oriented to person, place, and time. He appears well-developed and well-nourished. No distress.  HENT:  Head: Normocephalic and atraumatic.  Eyes: Conjunctivae are normal. Pupils are equal, round, and reactive to light.  Neck: Neck supple.  Cardiovascular: Normal rate.   Pulmonary/Chest: Effort normal.  Musculoskeletal: Normal range of motion.  Neurological: He is alert and oriented to person, place, and time.  Skin: Skin is warm and dry.  Psychiatric: He has a normal mood and affect. His speech is normal. He is actively  hallucinating. Cognition and memory are impaired. He expresses impulsivity and inappropriate judgment. He expresses homicidal and suicidal ideation. He expresses suicidal plans.    ED Course  Procedures (including critical care time) Labs Review Labs Reviewed  CBC  COMPREHENSIVE METABOLIC PANEL  ETHANOL  URINE RAPID DRUG SCREEN (HOSP PERFORMED)    Imaging Review No results found.   EKG Interpretation None      MDM   Final diagnoses:  None   I personally performed the services described in this documentation, which was scribed in my presence. The recorded information has been reviewed and is accurate.      Arman Filter, NP 10/31/13 2200

## 2013-10-31 NOTE — ED Notes (Signed)
Pt states he is feeling suicidal and seeing things and hearing things  Pt states it started this morning  Pt states he is on medication and has been taking it as prescribed

## 2013-10-31 NOTE — ED Provider Notes (Signed)
Medical screening examination/treatment/procedure(s) were performed by non-physician practitioner and as supervising physician I was immediately available for consultation/collaboration.   EKG Interpretation None       Ethelda Chick, MD 10/31/13 2205

## 2013-10-31 NOTE — BH Assessment (Signed)
  Care Plan Guidelines: Regardless of whether or not a patient has a care plan, every ED patient is entitled to a Medical Screening Exam (MSE) per EMTALA to determine if an Emergency Medical Condition [facility]) exists. Care plans are guidelines, and do not constitute a standard of care or substitute for clinical judgment.  Patient Name: [name]  Care Plan updated: Nelly Rout, MD 10/19/2013  Mental Health Provider: [facility] ACTT team  Community ACT Team contact info: [facility] ACTT crisis line is [phone]. Team Lead is Darrick Penna [phone], contact a team member if the patient presents with suicidal/homicidal ideations or hallucinations with command.    1^ Diagnosis: Schizoaffective D/O, Bipolar 1 D/O, Explosive Personality D/O, Suicidal ideations, Homicidal Ideations, Command Hallucinations   Background:  .    Patient is currently his own guardian living with his biological mother.   .    He has a history of 20 encounters with the [facility] since 07/01/2013 for similar chief complaints.   .    Patient is currently participating with [name] ACTT team as an enhanced community services providing a holistic scope of services.   .    Patient has noticeable malingering symptoms or behaviors that would include him requesting multiple Emergency visits.   .    The ACTT team has a psychiatrist, licensed clinical provider (LCSW, LPC, LCAS, or etc), and Herbalist, and an Tree surgeon to assist patient with crisis interventions, preventions, medication monitoring, and case management.  .    Patient presented to the Emergency Department with suicidal/homicidal ideations and auditory hallucinations with command. Psychiatry has felt no therapeutic benefit from prior inpatient psych stays.  Recently patient has been discharged from the emergency department and [facility] 4 times in the past 10 days.   Plan: .    Provide MSE as seen as possible after arrival to ED. If  patient presents to [facility], inform patient immediately that his ACTT team will be contacted for an evaluation of symptoms.  Patient will not be transferred to the Psych ED unit because that is a familiar comfortable environment for him.   .    Once Patient is assessed by his ACTT team, if his symptoms are identified as an Emergent need such as suicidal/homicidal ideations with intent and plan of harm to self or others he then will be assessed by ED psych staff.   .    Focus on presence or absence of objective signs/symptoms .    Upon evaluation, if Patient is not in an acute psychiatric crisis, immediately discharge patient.  Notify security for escort or law enforcement if necessary.   .    If no emergent medical or psychiatric conditions are present, discharge patient to follow up with his ACTT team provider [facility] [phone].        Clista Bernhardt, [facility] Triage Specialist 10/31/2013 11:11 PM

## 2013-10-31 NOTE — BH Assessment (Addendum)
Spoke with Eulis Manly from [facility] team to discuss ED Care Plan. Informed Janette, QP that ACTT team is suppose to assess him and if his symptoms are identified as Emergent then he will be assessed by ED psych staff. Janette, QP reported that she will contact Darrick Penna, TL and call this Clinical research associate back.

## 2013-11-01 ENCOUNTER — Encounter (HOSPITAL_COMMUNITY): Payer: Self-pay | Admitting: Registered Nurse

## 2013-11-01 DIAGNOSIS — F603 Borderline personality disorder: Secondary | ICD-10-CM

## 2013-11-01 DIAGNOSIS — F259 Schizoaffective disorder, unspecified: Secondary | ICD-10-CM

## 2013-11-01 DIAGNOSIS — F319 Bipolar disorder, unspecified: Secondary | ICD-10-CM

## 2013-11-01 DIAGNOSIS — R443 Hallucinations, unspecified: Secondary | ICD-10-CM

## 2013-11-01 MED ORDER — IBUPROFEN 200 MG PO TABS
600.0000 mg | ORAL_TABLET | Freq: Once | ORAL | Status: AC
  Administered 2013-11-01: 600 mg via ORAL
  Filled 2013-11-01: qty 3

## 2013-11-01 MED ORDER — ACETAMINOPHEN 325 MG PO TABS
650.0000 mg | ORAL_TABLET | Freq: Once | ORAL | Status: AC
  Administered 2013-11-01: 650 mg via ORAL
  Filled 2013-11-01: qty 2

## 2013-11-01 NOTE — Progress Notes (Signed)
CSW spoke with the patient's mother to inform her current discharge and need for transportation home.  CSW shared some concerns for the patient multiple emergency department visits in the last 6 months although having a primary mental health provider.  She reports in comparison to the team in [place] that worked with the patient prior to him relocating to [facility] the current provider is not meeting her satisfactory standard.  She report the past team would provide multiple interventions for the patient such as crisis intervention, case management, link him to groups, and liked him to a job readiness program where the patient got a certification in [employer].  She reports that the current team has not provided any of the needed interventions for her son and do not appear effective.  She asked for other providers in the community that offer the same services to follow with after his discharge.  CSW was given permission to complete the referral for [facility] to follow up with the patient and family on the next business day.  Because it was mentioned in a pervious note about a group home placement CSW asked the mother if this is still a concern for the family.  She reports that the patient would not benefit from group home placement because the last placement he had multiple altercations in the group home.    Mother reports she will arrive around 12pm to pick the patient up for discharge.    12:37pm- ACTT never followed up with the crisis called made last night by the emergency department for this patient. CSW called the crisis line and with Rodney Booze with the ACTT team to inform her that the patient was discharging and the mother was coming for transport.  Ms. Montez Morita requested that CSW inform the mother if the police is needed later today to as them to take the patient to [facility] instead of the emergency department.    CSW provided the mother with the information requested about [facility] and that the  referral was faxed already.  CSW communicated to the mother that an ACTT team member will be in contact with her about future crisis needs.  She stated the patient has an appointment on Tuesday with [facility] to discuss his current needs.    [name], MSW, [place], 11/01/2013 Evening Clinical Social Worker [phone]

## 2013-11-01 NOTE — BH Assessment (Signed)
Spoke with Archie Balboa, QP who reported that she spoke with Darrick Penna who informed her that she will be out in the morning to complete the assessment. She also reported that there is a care team meeting scheduled with [facility]. Spoke with Earley Favor to inform her that Darrick Penna will be out to completed that assessment in the morning. Dondra Spry encouraged this writer to contact the ACT team and have someone come out tonight to complete the assessment. Spoke with Archie Balboa, QP at 1:10 am and informed her that someone need to come out tonight and assess the pt. Galen Daft was also provided with this clinician's direct number to coordinate a time for Darrick Penna to come out tonight and assess the patient.

## 2013-11-01 NOTE — Discharge Instructions (Signed)
Schizoaffective Disorder Schizoaffective disorder (ScAD) is a mental illness. It causes symptoms that are a mixture of schizophrenia (a psychotic disorder) and an affective (mood) disorder. The schizophrenic symptoms may include delusions, hallucinations, or odd behavior. The mood symptoms may be similar to major depression or bipolar disorder. ScAD may interfere with personal relationships or normal daily activities. People with ScAD are at increased risk for job loss, social isolation,physical health problems, anxiety and substance use disorders, and suicide. ScAD usually occurs in cycles. Periods of severe symptoms are followed by periods of less severe symptoms or improvement. The illness affects men and women equally but usually appears at an earlier age (teenage or early adult years) in men. People who have family members with schizophrenia, bipolar disorder, or ScAD are at higher risk of developing ScAD. SYMPTOMS  At any one time, people with ScAD may have psychotic symptoms only or both psychotic and mood symptoms. The psychotic symptoms include one or more of the following:  Hearing, seeing, or feeling things that are not there (hallucinations).   Having fixed, false beliefs (delusions). The delusions usually are of being attacked, harassed, cheated, persecuted, or conspired against (paranoid delusions).  Speaking in a way that makes no sense to others (disorganized speech). The psychotic symptoms of ScAD may also include confusing or odd behavior or any of the negative symptoms of schizophrenia. These include loss of motivation for normal daily activities, such as bathing or grooming, withdrawal from other people, and lack of emotions.  The mood symptoms of ScAD occur more often than not. They resemble major depressive disorder or bipolar mania. Symptoms of major depression include depressed mood and four or more of the following:  Loss of interest in usually pleasurable activities  (anhedonia).  Sleeping more or less than normal.  Feeling worthless or excessively guilty.  Lack of energy or motivation.  Trouble concentrating.  Eating more or less than usual.  Thinking a lot about death or suicide. Symptoms of bipolar mania include abnormally elevated or irritable mood and increased energy or activity, plus three or more of the following:   More confidence than normal or feeling that you are able to do anything (grandiosity).  Feeling rested with less sleep than normal.   Being easily distracted.   Talking more than usual or feeling pressured to keep talking.   Feeling that your thoughts are racing.  Engaging in high-risk activities such as buying sprees or foolish business decisions. DIAGNOSIS  ScAD is diagnosed through an assessment by your health care provider. Your health care provider will observe and ask questions about your thoughts, behavior, mood, and ability to function in daily life. Your health care provider may also ask questions about your medical history and use of drugs, including prescription medicines. Your health care provider may also order blood tests and imaging exams. Certain medical conditions and substances can cause symptoms that resemble ScAD. Your health care provider may refer you to a mental health specialist for evaluation.  ScAD is divided into two types. The depressive type is diagnosed if your mood symptoms are limited to major depression. The bipolar type is diagnosed if your mood symptoms are manic or a mixture of manic and depressive symptoms TREATMENT  ScAD is usually a lifelong illness. Long-term treatment is necessary. The following treatments are available:  Medicine. Different types of medicine are used to treat ScAD. The exact combination depends on the type and severity of your symptoms. Antipsychotic medicine is used to control psychotic symptomssuch as delusions, paranoia,   and hallucinations. Mood stabilizers can  even the highs and lows of bipolar manic mood swings. Antidepressant medicines are used to treat major depressive symptoms.  Counseling or talk therapy. Individual, group, or family counseling may be helpful in providing education, support, and guidance. Many people with ScAD also benefit from social skills and job skills (vocational) training. A combination of medicine and counseling is usually best for managing the disorder over time. A procedure in which electricity is applied to the brain through the scalp (electroconvulsive therapy) may be used to treat people with severe manic symptoms that do not respond to medicine and counseling. HOME CARE INSTRUCTIONS   Take all your medicine as prescribed.  Check with your health care provider before starting new prescription or over-the-counter medicines.  Keep all follow up appointments with your health care provider. SEEK MEDICAL CARE IF:   If you are not able to take your medicines as prescribed.  If your symptoms get worse. SEEK IMMEDIATE MEDICAL CARE IF:   You have serious thoughts about hurting yourself or others. Document Released: 06/25/2006 Document Revised: 06/29/2013 Document Reviewed: 09/26/2012 Exit[employer] Patient Information 2015 [employer]. This information is not intended to replace advice given to you by your health care provider. Make sure you discuss any questions you have with your health care provider.  

## 2013-11-01 NOTE — Consult Note (Signed)
Psychiatric Follow UP Consultation   Reason for Consult:  SI/HI Referring Physician:  EDP [name] is an 23 y.o. male. 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 Asthma   . Bipolar 1 disorder   . ODD (oppositional defiant disorder)   . ADHD (attention deficit hyperactivity disorder)   . Schizophrenia   . Suicidal ideation   . Homicidal ideation   . Explosive personality disorder    AXIS IV:  housing problems, problems related to legal system/crime and problems related to social environment AXIS V:  61-70 mild symptoms  Assessment: No evidence of imminent risk to self or others at present.    Plan: 1. D/C patient out to return home. 2. Consider out patient commitment to increase compliance with medication. This can be done by his out patient provider. 3. Discuss proper use of ED services with patient.    Subjective:   [name] is a 23 y.o. male patient.  Today patient states that he is ready to come home.  When asked why he came to the hospital patient states "My mom told me to come to the hospital to get some help."  When asked what type of help did he need patient states "I don't know my mom told me to come."  Discussed ACT Team with patient.  Patient states "I fired them; they don't help me; I can manage on my own."  Patient states that he takes his medication daily and has outpatient services with [facility].  Patient also states that he has never called the ACT Team prior to coming to the hospital.  Patient denies suicidal/homicidal ideation at this time patient is also denying psychosis and paranoia at this time.  TTS [name],) spoke with patient mother related to multiple hospital visit and outpatient services.  Patient mother was concerned  that patient was receiving the outpatient services that are needed.  (Please see note on mothers concerns.)  [name] will meet with mother when she comes to pick up patient and also referral information will be given for other out patient services that are available.    Patient/mother was given resource information on [facility] outpatient services and ACT team.  HPI:  Patient has a long history of mental illness and medical non-compliance who does not follow his treatment plan set up with ACT support personnel. HPI Elements:   Location:  [facility]. Quality:  Chronic. Severity:  mild. Timing:  on going. Duration:  constant. Context:  patient continues to display poor coping skills for most problems, seeing the ED as his only option..  Past Psychiatric History: Past Medical History  Diagnosis Date  . Asthma   . Bipolar 1 disorder   . ODD (oppositional defiant disorder)   . ADHD (attention deficit hyperactivity disorder)   . Schizophrenia   . Suicidal ideation   . Homicidal ideation   . Explosive personality disorder     reports that he has never smoked. He does not have any smokeless tobacco history on file. He reports that he does not drink alcohol or use illicit drugs. History reviewed. No pertinent family history. Allergies:   Allergies  Allergen Reactions  . Carbamazepine Anaphylaxis, Other (See Comments) and Cough    Cough up blood  . Geodon [Ziprasidone Hcl] Anaphylaxis and Other (See Comments)    "  Causes my throat to close"  . Haldol [Haloperidol Lactate] Anaphylaxis and Other (See Comments)    Patient reports that he stopped taking this because of throat swelling.   [name] [Atomoxetine] Anaphylaxis, Other (See Comments) and Cough    Cough up blood  . Other Nausea Only and Other (See Comments)    Apple Juice, stomach hurts and itchy throat     ACT Assessment Complete:  Yes:    Educational Status    Risk to Self: Risk to self with the past 6 months Is patient at risk for  suicide?: Yes Substance abuse history and/or treatment for substance abuse?: No  Risk to Others:    Abuse:    Prior Inpatient Therapy:    Prior Outpatient Therapy:    Additional Information:      Objective: Blood pressure 138/94, pulse 66, resp. rate 18, SpO2 100.00%.There is no weight on file to calculate BMI.No results found for this or any previous visit (from the past 72 hour(s)). Labs are reviewed and are pertinent for negative UDS, Normal Depakote level, AST returns to normal.  All other labs benign.  No current facility-administered medications for this encounter.   Current Outpatient Prescriptions  Medication Sig Dispense Refill  . albuterol (PROVENTIL HFA;VENTOLIN HFA) 108 (90 BASE) MCG/ACT inhaler Inhale 1 puff into the lungs every 6 (six) hours as needed for wheezing or shortness of breath.      . ARIPiprazole (ABILIFY MAINTENA) 400 MG SUSR Inject 400 mg into the muscle every 30 (thirty) days.      . benztropine (COGENTIN) 1 MG tablet Take 1 tablet (1 mg total) by mouth 2 (two) times daily.  60 tablet  0  . chlorproMAZINE (THORAZINE) 100 MG tablet Take 100 mg by mouth 2 (two) times daily.      . divalproex (DEPAKOTE ER) 500 MG 24 hr tablet Take 500 mg by mouth 2 (two) times daily.      . traZODone (DESYREL) 50 MG tablet Take 50 mg by mouth at bedtime as needed for sleep.      . ARIPiprazole (ABILIFY) 10 MG tablet Take 1 tablet (10 mg total) by mouth daily.  14 tablet  0    Psychiatric Specialty Exam:     Blood pressure 138/94, pulse 66, resp. rate 18, SpO2 100.00%.There is no weight on file to calculate BMI.  General Appearance: Casual  Eye Contact::  Good  Speech:  Clear and Coherent  Volume:  Normal  Mood:  Euthymic, patient does not appear depressed, smiles at times, and has spontaneous speech.   Affect:  Non-Congruent appears euthymic, but displays anger and threatening affect when he does not get what he desires  Thought Process:  Goal Directed  Orientation:   Other:  patient gives a very week "performance" of being disoriented which is not believable to this provider in the context of this evaluation  Thought Content:  Hallucinations: Patient denies hearing voices at this time  Suicidal Thoughts:  No  Homicidal Thoughts:  No  Memory:  NA  Judgement:  Fair  Insight:  Fair  Psychomotor Activity:  Normal  Concentration:  Good  Recall:  Fair  Akathisia:  No  Handed:    AIMS (if indicated):     Assets:  Housing Physical Health  Sleep:      Treatment Plan Summary: Discharge home with resource information for outpatient.  Patient to follow up with [facility]  Shuvon Rankin, FNP-BC  11:37 AM 11/01/2013 Patient seen and I agree with  assessment and plan Diannia Ruder MD

## 2013-11-01 NOTE — [facility] Suicide Risk Assessment (Signed)
Suicide Risk Assessment  Discharge Assessment     Demographic Factors:  Male  Total Time spent with patient: 30 minutes  Psychiatric Specialty Exam: Blood pressure 138/94, pulse 66, resp. rate 18, SpO2 100.00%.There is no weight on file to calculate BMI.  General Appearance: Casual  Eye Contact:: Good  Speech: Clear and Coherent  Volume: Normal  Mood: Euthymic, patient does not appear depressed, smiles at times, and has spontaneous speech.  Affect: Non-Congruent appears euthymic, but displays anger and threatening affect when he does not get what he desires  Thought Process: Goal Directed  Orientation: Other: patient gives a very week "performance" of being disoriented which is not believable to this provider in the context of this evaluation  Thought Content: Hallucinations: Patient denies hearing voices at this time  Suicidal Thoughts: No  Homicidal Thoughts: No  Memory: NA  Judgement: Fair  Insight: Fair  Psychomotor Activity: Normal  Concentration: Good  Recall: Fair  Akathisia: No  Handed:  AIMS (if indicated): Assets: Housing  Physical Health  Sleep:   Musculoskeletal: Strength & Muscle Tone: within normal limits Gait & Station: normal Patient leans: N/A   Mental Status Per Nursing Assessment::   On Admission:     Current Mental Status by Physician: Patient denies suicidal/homicidal ideation, psychosis, and paranoia  Loss Factors: NA  Historical Factors: NA  Risk Reduction Factors:   Living with another person, especially a relative and Positive social support  Continued Clinical Symptoms:  Previous Psychiatric Diagnoses and Treatments  Cognitive Features That Contribute To Risk:    Cognitive limited    Suicide Risk:  Minimal: No identifiable suicidal ideation.  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  Asthma    .  Bipolar 1 disorder    .  ODD (oppositional defiant disorder)    .  ADHD (attention deficit hyperactivity disorder)    .  Schizophrenia    .  Suicidal ideation    .  Homicidal ideation    .  Explosive personality disorder     AXIS IV: housing problems, problems related to legal system/crime and problems related to social environment  AXIS V: 61-70 mild symptoms  Plan Of Care/Follow-up recommendations:  Activity:  as tolerated Diet:  as tolerated  Follow up with [facility] for outpatient and ACT services  Is patient on multiple antipsychotic therapies at discharge:  No   Has Patient had three or more failed trials of antipsychotic monotherapy by history:  No  Recommended Plan for Multiple Antipsychotic Therapies: NA    Rankin, Shuvon, FNP-BC 11/01/2013, 12:08 PM Patient seen and I agree with assessment Diannia Ruder MD

## 2013-11-01 NOTE — ED Notes (Signed)
Pt presents with SI and HI, plan to  Hit someone over the head with a bat or stab someone with a knife multiple times.  Pt reports he is experiencing AV hallucinations, feeling hopeless.  Complaint of L arm pain related to a fall 2 weeks ago also.  No deformity or swelling noted, strong radial pulse noted.  Pt rates pain as 10 on pain scale.  Pt cooperative and anxious.

## 2013-11-03 ENCOUNTER — Encounter (HOSPITAL_COMMUNITY): Payer: Self-pay | Admitting: Emergency Medicine

## 2013-11-03 ENCOUNTER — Emergency Department (HOSPITAL_COMMUNITY)
Admission: EM | Admit: 2013-11-03 | Discharge: 2013-11-03 | Disposition: A | Discharge status: Home / Self Care | Payer: Medicaid Other | Attending: Emergency Medicine | Admitting: Emergency Medicine

## 2013-11-03 DIAGNOSIS — R110 Nausea: Secondary | ICD-10-CM | POA: Insufficient documentation

## 2013-11-03 DIAGNOSIS — R0789 Other chest pain: Secondary | ICD-10-CM | POA: Insufficient documentation

## 2013-11-03 DIAGNOSIS — R45851 Suicidal ideations: Secondary | ICD-10-CM | POA: Diagnosis not present

## 2013-11-03 DIAGNOSIS — F259 Schizoaffective disorder, unspecified: Secondary | ICD-10-CM | POA: Insufficient documentation

## 2013-11-03 DIAGNOSIS — J45909 Unspecified asthma, uncomplicated: Secondary | ICD-10-CM | POA: Diagnosis not present

## 2013-11-03 DIAGNOSIS — F319 Bipolar disorder, unspecified: Secondary | ICD-10-CM | POA: Insufficient documentation

## 2013-11-03 DIAGNOSIS — Z79899 Other long term (current) drug therapy: Secondary | ICD-10-CM | POA: Diagnosis not present

## 2013-11-03 DIAGNOSIS — R42 Dizziness and giddiness: Secondary | ICD-10-CM | POA: Diagnosis not present

## 2013-11-03 LAB — RAPID URINE DRUG SCREEN, HOSP PERFORMED
Amphetamines: NOT DETECTED
BENZODIAZEPINES: NOT DETECTED
Barbiturates: NOT DETECTED
COCAINE: NOT DETECTED
OPIATES: NOT DETECTED
Tetrahydrocannabinol: NOT DETECTED

## 2013-11-03 LAB — COMPREHENSIVE METABOLIC PANEL
ALBUMIN: 4 g/dL (ref 3.5–5.2)
ALT: 23 U/L (ref 0–53)
AST: 26 U/L (ref 0–37)
Alkaline Phosphatase: 67 U/L (ref 39–117)
Anion gap: 14 (ref 5–15)
BUN: 9 mg/dL (ref 6–23)
CO2: 26 mEq/L (ref 19–32)
Calcium: 9.1 mg/dL (ref 8.4–10.5)
Chloride: 99 mEq/L (ref 96–112)
Creatinine, Ser: 0.91 mg/dL (ref 0.50–1.35)
GFR calc Af Amer: >90 mL/min (ref >90)
GFR calc non Af Amer: >90 mL/min (ref >90)
Glucose, Bld: 93 mg/dL (ref 70–99)
Potassium: 3.9 mEq/L (ref 3.7–5.3)
SODIUM: 139 meq/L (ref 137–147)
TOTAL PROTEIN: 7.4 g/dL (ref 6.0–8.3)
Total Bilirubin: 0.2 mg/dL — ABNORMAL LOW (ref 0.3–1.2)

## 2013-11-03 LAB — CBC WITH DIFFERENTIAL/PLATELET
BASOS ABS: 0 10*3/uL (ref 0.0–0.1)
Basophils Relative: 0 % (ref 0–1)
EOS ABS: 0.1 10*3/uL (ref 0.0–0.7)
Eosinophils Relative: 2 % (ref 0–5)
HCT: 41.4 % (ref 39.0–52.0)
Hemoglobin: 14.1 g/dL (ref 13.0–17.0)
Lymphocytes Relative: 32 % (ref 12–46)
Lymphs Abs: 1.8 10*3/uL (ref 0.7–4.0)
MCH: 26.8 pg (ref 26.0–34.0)
MCHC: 34.1 g/dL (ref 30.0–36.0)
MCV: 78.6 fL (ref 78.0–100.0)
Monocytes Absolute: 0.5 10*3/uL (ref 0.1–1.0)
Monocytes Relative: 9 % (ref 3–12)
NEUTROS PCT: 57 % (ref 43–77)
Neutro Abs: 3.2 10*3/uL (ref 1.7–7.7)
PLATELETS: 282 10*3/uL (ref 150–400)
RBC: 5.27 MIL/uL (ref 4.22–5.81)
RDW: 13.3 % (ref 11.5–15.5)
WBC: 5.7 10*3/uL (ref 4.0–10.5)

## 2013-11-03 LAB — ETHANOL: Alcohol, Ethyl (B): <11 mg/dL (ref 0–11)

## 2013-11-03 LAB — SALICYLATE LEVEL: Salicylate Lvl: <2 mg/dL — ABNORMAL LOW (ref 2.8–20.0)

## 2013-11-03 LAB — ACETAMINOPHEN LEVEL: Acetaminophen (Tylenol), Serum: <15 ug/mL (ref 10–30)

## 2013-11-03 MED ORDER — ONDANSETRON HCL 4 MG/2ML IJ SOLN
4.0000 mg | Freq: Once | INTRAMUSCULAR | Status: AC
  Administered 2013-11-03: 4 mg via INTRAVENOUS
  Filled 2013-11-03: qty 2

## 2013-11-03 MED ORDER — SODIUM CHLORIDE 0.9 % IV BOLUS (SEPSIS)
1000.0000 mL | Freq: Once | INTRAVENOUS | Status: AC
  Administered 2013-11-03: 1000 mL via INTRAVENOUS

## 2013-11-03 NOTE — ED Notes (Signed)
Per report from [facility], pt was wanded by security.

## 2013-11-03 NOTE — ED Notes (Signed)
Patient calm cooperative denies SI and HI.

## 2013-11-03 NOTE — ED Notes (Signed)
Patient arrived by EMS after taking Thorazine and Abilify.  Patient continues to get up and then states he is feeling dizzy.  Attempts to take IV out but encouraged him to leave it in.  Patient has visual or auditory hallucinations.  States "[name] has been telling him to hurt himself and if he doesn't she will make him act out".   States if he gets out he will try to hurt himself again.  When this nurse walked in the room he was pulling the gown apart stating "[name] told him to put it around his neck".

## 2013-11-03 NOTE — ED Provider Notes (Signed)
  Face-to-face evaluation   History: Patient was evaluated at the 12:35, because he stated that he was no longer suicidal. He has been stating this for several hours, according to nurse. We have contacted the outpatient assessment and crisis team. They initially committed to seeing him, but had not shown up yet. The patient states that he wants to go home now. He, states that he will not hurt himself, and that he will followup with his providers as previously planned  Physical exam: Alert, cooperative. He is communicative and calm. He ambulates easily without distress.   Assessment- schizoaffective disorder with malingering.  Plan- outpatient followup as planned, and as needed with his mental health provider team.  [name] Melter, MD 11/03/13 1248

## 2013-11-03 NOTE — Discharge Instructions (Signed)
Suicidal Feelings, How to Help Yourself °Everyone feels sad or unhappy at times, but depressing thoughts and feelings of hopelessness can lead to thoughts of suicide. It can seem as if life is too tough to handle. If you feel as though you have reached the point where suicide is the only answer, it is time to let someone know immediately.  °HOW TO COPE AND PREVENT SUICIDE °· Let family, friends, teachers, or counselors know. Get help. Try not to isolate yourself from those who care about you. Even though you may not feel sociable, talk with someone every day. It is best if it is face-to-face. Remember, they will want to help you. °· Eat a regularly spaced and well-balanced diet. °· Get plenty of rest. °· Avoid alcohol and drugs because they will only make you feel worse and may also lower your inhibitions. Remove them from the home. If you are thinking of taking an overdose of your prescribed medicines, give your medicines to someone who can give them to you one day at a time. If you are on antidepressants, let your caregiver know of your feelings so he or she can provide a safer medicine, if that is a concern. °· Remove weapons or poisons from your home. °· Try to stick to routines. Follow a schedule and remind yourself that you have to keep that schedule every day. °· Set some realistic goals and achieve them. Make a list and cross things off as you go. Accomplishments give a sense of worth. Wait until you are feeling better before doing things you find difficult or unpleasant to do. °· If you are able, try to start exercising. Even half-hour periods of exercise each day will make you feel better. Getting out in the sun or into nature helps you recover from depression faster. If you have a favorite place to walk, take advantage of that. °· Increase safe activities that have always given you pleasure. This may include playing your favorite music, reading a good book, painting a picture, or playing your favorite  instrument. Do whatever takes your mind off your depression. °· Keep your living space well-lighted. °GET HELP °Contact a suicide hotline, crisis center, or local suicide prevention center for help right away. Local centers may include a hospital, clinic, community service organization, social service provider, or health department. °· Call your local emergency services (911 in the [place]). °· Call a suicide hotline: °¨ 1-800-273-TALK (1-800-273-8255) in the [place]. °¨ 1-800-SUICIDE (1-800-784-2433) in the [place]. °¨ 1-888-628-9454 in the [place] for Spanish-speaking counselors. °¨ 1-800-799-4TTY (1-800-799-4889) in the [place] for TTY users. °· Visit the following websites for information and help: °¨ National Suicide Prevention Lifeline: www.suicidepreventionlifeline.org °¨ Hopeline: www.hopeline.com °¨ American Foundation for Suicide Prevention: www.afsp.org °· For lesbian, gay, bisexual, transgender, or questioning youth, contact The Trevor Project: °¨ 1-866-4-U-TREVOR (1-866-488-7386) in the [place]. °¨ www.thetrevorproject.org °· In [place], treatment resources are listed in each province with listings available under [employer] or similar titles. Another source for Crisis Centres by Province is located at http://www.suicideprevention.ca/in-crisis-now/find-a-crisis-centre-now/crisis-centres °Document Released: 08/19/2002 Document Revised: 05/07/2011 Document Reviewed: 06/09/2013 °ExitCare® Patient Information ©2015 [employer]. This information is not intended to replace advice given to you by your health care provider. Make sure you discuss any questions you have with your health care provider. ° °

## 2013-11-03 NOTE — ED Notes (Signed)
Pt requesting to leave, denies SI/HI, waiting on ACT team, MD aware.

## 2013-11-03 NOTE — ED Notes (Signed)
Effie Shy, MD requests that [name] be called for estimated time of arrival to complete eval, [facility] staff contacted, awaiting return call, pt aware of plan of care, pt calm & cooperative at this time

## 2013-11-03 NOTE — ED Notes (Signed)
Rodney Booze on call staff at [facility] was contacted, the plan at this time per Montez Morita is for someone to come to the ED & eval the pt at 10:30 today, Effie Shy, MD updated

## 2013-11-03 NOTE — ED Notes (Signed)
POD [facility] RN Melinda aware of pt need to be transferred to POD C when room is available

## 2013-11-03 NOTE — ED Provider Notes (Signed)
CSN: [id number]     Arrival date & time 11/03/13  0002 History   First MD Initiated Contact with Patient 11/03/13 0023     Chief Complaint  Patient presents with  . Suicidal     (Consider location/radiation/quality/duration/timing/severity/associated sxs/prior Treatment) HPI  [name] is a 23 y.o. male with a past medical history of bipolar disorder, schizophrenia, and suicide attempts coming in with suicidal ideation. Patient states he took 6 Thorazine tablets which were 100 mg each, and 6 a pillow 5 tablets which were 400 mg each. He is unsure whether night and Benadryl as well. He denies Tylenol or aspirin. He is currently expressing some chest tightness and nausea without any vomiting. Patient has an extensive history of suicide attempts and multiple ED visits, just over the past 3 months. Patient also complaining of some lightheadedness.  He denies ingesting any alcohol or illicit drugs. He states he did this tonight because he is tired of people not believing he will not kill himself.    10 Systems reviewed and are negative for acute change except as noted in the HPI.        Past Medical History  Diagnosis Date  . Asthma   . Bipolar 1 disorder   . ODD (oppositional defiant disorder)   . ADHD (attention deficit hyperactivity disorder)   . Schizophrenia   . Suicidal ideation   . Homicidal ideation   . Explosive personality disorder    History reviewed. No pertinent past surgical history. History reviewed. No pertinent family history. History  Substance Use Topics  . Smoking status: Never Smoker   . Smokeless tobacco: Not on file  . Alcohol Use: Yes     Comment: Patient denies    Review of Systems    Allergies  Carbamazepine; Geodon; Haldol; Strattera; and Other  Home Medications   Prior to Admission medications   Medication Sig Start Date End Date Taking? Authorizing Provider  albuterol (PROVENTIL HFA;VENTOLIN HFA) 108 (90 BASE) MCG/ACT inhaler Inhale 1  puff into the lungs every 6 (six) hours as needed for wheezing or shortness of breath.    Historical Provider, MD  ARIPiprazole (ABILIFY MAINTENA) 400 MG SUSR Inject 400 mg into the muscle every 30 (thirty) days.    Historical Provider, MD  ARIPiprazole (ABILIFY) 10 MG tablet Take by mouth daily.    Shuvon Rankin, NP  benztropine (COGENTIN) 1 MG tablet Take 1 tablet (1 mg total) by mouth 2 (two) times daily. 10/13/13   Beau Fanny, FNP  chlorproMAZINE (THORAZINE) 100 MG tablet Take 100 mg by mouth 2 (two) times daily.    Historical Provider, MD  divalproex (DEPAKOTE ER) 500 MG 24 hr tablet Take 500 mg by mouth 2 (two) times daily.    Historical Provider, MD  traZODone (DESYREL) 50 MG tablet Take 50 mg by mouth at bedtime as needed for sleep.    Historical Provider, MD   BP 122/67  Pulse 98  Temp(Src) 98.6 F (37 C) (Oral)  Resp 20  Ht  (1.778 m)  Wt 245 lb (111.131 kg)  BMI 35.15 kg/m2  SpO2 99% Physical Exam  Nursing note and vitals reviewed. Constitutional: He is oriented to person, place, and time. Vital signs are normal. He appears well-developed and well-nourished.  Non-toxic appearance. He does not appear ill. No distress.  HENT:  Head: Normocephalic and atraumatic.  Nose: Nose normal.  Mouth/Throat: Oropharynx is clear and moist. No oropharyngeal exudate.  Eyes: Conjunctivae and EOM are normal. Pupils are  equal, round, and reactive to light. No scleral icterus.  Neck: Normal range of motion. Neck supple. No tracheal deviation, no edema, no erythema and normal range of motion present. No mass and no thyromegaly present.  Cardiovascular: Normal rate, regular rhythm, S1 normal, S2 normal, normal heart sounds, intact distal pulses and normal pulses.  Exam reveals no gallop and no friction rub.   No murmur heard. Pulses:      Radial pulses are 2+ on the right side, and 2+ on the left side.       Dorsalis pedis pulses are 2+ on the right side, and 2+ on the left side.   Pulmonary/Chest: Effort normal and breath sounds normal. No respiratory distress. He has no wheezes. He has no rhonchi. He has no rales.  Abdominal: Soft. Normal appearance and bowel sounds are normal. He exhibits no distension, no ascites and no mass. There is no hepatosplenomegaly. There is no tenderness. There is no rebound, no guarding and no CVA tenderness.  Musculoskeletal: Normal range of motion. He exhibits no edema and no tenderness.  Lymphadenopathy:    He has no cervical adenopathy.  Neurological: He is alert and oriented to person, place, and time. He has normal strength. No cranial nerve deficit or sensory deficit. He exhibits normal muscle tone. Coordination normal. GCS eye subscore is 4. GCS verbal subscore is 5. GCS motor subscore is 6.  Skin: Skin is warm, dry and intact. No petechiae and no rash noted. He is not diaphoretic. No erythema. No pallor.  Psychiatric:  Bizarre affect    ED Course  Procedures (including critical care time) Labs Review Labs Reviewed  COMPREHENSIVE METABOLIC PANEL - Abnormal; Notable for the following:    Total Bilirubin 0.2 (*)    All other components within normal limits  SALICYLATE LEVEL - Abnormal; Notable for the following:    Salicylate Lvl <2.0 (*)    All other components within normal limits  CBC WITH DIFFERENTIAL  ETHANOL  URINE RAPID DRUG SCREEN (HOSP PERFORMED)  ACETAMINOPHEN LEVEL    Imaging Review No results found.   EKG Interpretation None      MDM   Final diagnoses:  None    History presents emergency department out of concern for suicidal ideation. I spoke with Onalee Hua at [facility], who advises monitoring for tachycardia and anticholinergic effects. Patient remains hemodynamically stable, but still endorses suicidality. I called the ACT team who states that he'll see the patient in the morning for evaluation. They're very familiar with this patient and have already seen him this week.  Patient is amenable  with this plan and will wait until morning to be evaluated.  Please see oncoming providers note for the ultimate disposition of this patient.    Tomasita Crumble, MD 11/03/13 [phone]

## 2013-11-03 NOTE — ED Notes (Signed)
Unable to speak with emergency ACT team member. Dr. Effie Shy notified. "Ok, I'll discharge him."

## 2013-11-03 NOTE — ED Notes (Signed)
Pt in hallway states, "I want to leave, im not IVC'd, I want my stuff back." I want to speak with the doctor. Effie Shy, MD in hallway asked this RN to repage ACT.

## 2013-11-04 ENCOUNTER — Encounter (HOSPITAL_COMMUNITY): Payer: Self-pay | Admitting: Emergency Medicine

## 2013-11-04 ENCOUNTER — Emergency Department (HOSPITAL_COMMUNITY)
Admission: EM | Admit: 2013-11-04 | Discharge: 2013-11-05 | Disposition: A | Discharge status: Home / Self Care | Payer: Medicaid Other | Attending: Emergency Medicine | Admitting: Emergency Medicine

## 2013-11-04 DIAGNOSIS — Z008 Encounter for other general examination: Secondary | ICD-10-CM | POA: Insufficient documentation

## 2013-11-04 DIAGNOSIS — Z79899 Other long term (current) drug therapy: Secondary | ICD-10-CM | POA: Insufficient documentation

## 2013-11-04 DIAGNOSIS — J45909 Unspecified asthma, uncomplicated: Secondary | ICD-10-CM | POA: Insufficient documentation

## 2013-11-04 DIAGNOSIS — IMO0002 Reserved for concepts with insufficient information to code with codable children: Secondary | ICD-10-CM | POA: Insufficient documentation

## 2013-11-04 DIAGNOSIS — F319 Bipolar disorder, unspecified: Secondary | ICD-10-CM | POA: Diagnosis not present

## 2013-11-04 DIAGNOSIS — R45851 Suicidal ideations: Secondary | ICD-10-CM | POA: Diagnosis not present

## 2013-11-04 DIAGNOSIS — F209 Schizophrenia, unspecified: Secondary | ICD-10-CM | POA: Insufficient documentation

## 2013-11-04 LAB — CBC WITH DIFFERENTIAL/PLATELET
Basophils Absolute: 0 10*3/uL (ref 0.0–0.1)
Basophils Relative: 0 % (ref 0–1)
Eosinophils Absolute: 0.1 10*3/uL (ref 0.0–0.7)
Eosinophils Relative: 2 % (ref 0–5)
HCT: 42.7 % (ref 39.0–52.0)
Hemoglobin: 14.7 g/dL (ref 13.0–17.0)
Lymphocytes Relative: 31 % (ref 12–46)
Lymphs Abs: 1.9 10*3/uL (ref 0.7–4.0)
MCH: 27.2 pg (ref 26.0–34.0)
MCHC: 34.4 g/dL (ref 30.0–36.0)
MCV: 78.9 fL (ref 78.0–100.0)
Monocytes Absolute: 0.5 10*3/uL (ref 0.1–1.0)
Monocytes Relative: 8 % (ref 3–12)
Neutro Abs: 3.5 10*3/uL (ref 1.7–7.7)
Neutrophils Relative %: 59 % (ref 43–77)
Platelets: 285 10*3/uL (ref 150–400)
RBC: 5.41 MIL/uL (ref 4.22–5.81)
RDW: 13.1 % (ref 11.5–15.5)
WBC: 5.9 10*3/uL (ref 4.0–10.5)

## 2013-11-04 LAB — BASIC METABOLIC PANEL
Anion gap: 11 (ref 5–15)
BUN: 5 mg/dL — ABNORMAL LOW (ref 6–23)
CO2: 28 mEq/L (ref 19–32)
Calcium: 9.5 mg/dL (ref 8.4–10.5)
Chloride: 100 mEq/L (ref 96–112)
Creatinine, Ser: 1.12 mg/dL (ref 0.50–1.35)
GFR calc Af Amer: >90 mL/min (ref >90)
GFR calc non Af Amer: >90 mL/min (ref >90)
Glucose, Bld: 84 mg/dL (ref 70–99)
Potassium: 4.2 mEq/L (ref 3.7–5.3)
Sodium: 139 mEq/L (ref 137–147)

## 2013-11-04 LAB — RAPID URINE DRUG SCREEN, HOSP PERFORMED
Amphetamines: NOT DETECTED
Barbiturates: NOT DETECTED
Benzodiazepines: NOT DETECTED
Cocaine: NOT DETECTED
Opiates: NOT DETECTED
Tetrahydrocannabinol: NOT DETECTED

## 2013-11-04 LAB — HEPATIC FUNCTION PANEL
ALBUMIN: 4.1 g/dL (ref 3.5–5.2)
ALT: 31 U/L (ref 0–53)
AST: 37 U/L (ref 0–37)
Alkaline Phosphatase: 74 U/L (ref 39–117)
BILIRUBIN TOTAL: 0.3 mg/dL (ref 0.3–1.2)
Bilirubin, Direct: <0.2 mg/dL (ref 0.0–0.3)
TOTAL PROTEIN: 7.7 g/dL (ref 6.0–8.3)

## 2013-11-04 LAB — SALICYLATE LEVEL: Salicylate Lvl: <2 mg/dL — ABNORMAL LOW (ref 2.8–20.0)

## 2013-11-04 LAB — ACETAMINOPHEN LEVEL

## 2013-11-04 LAB — PROTIME-INR
INR: 1 (ref 0.00–1.49)
Prothrombin Time: 13.2 seconds (ref 11.6–15.2)

## 2013-11-04 LAB — ETHANOL: Alcohol, Ethyl (B): <11 mg/dL (ref 0–11)

## 2013-11-04 MED ORDER — ALUM & MAG HYDROXIDE-SIMETH 200-200-20 MG/5ML PO SUSP: 30.0000 mL | ORAL | Status: DC | PRN

## 2013-11-04 MED ORDER — ONDANSETRON HCL 4 MG PO TABS: 4.0000 mg | ORAL_TABLET | Freq: Three times a day (TID) | ORAL | Status: DC | PRN

## 2013-11-04 MED ORDER — LORAZEPAM 1 MG PO TABS: 1.0000 mg | ORAL_TABLET | Freq: Three times a day (TID) | ORAL | Status: DC | PRN

## 2013-11-04 NOTE — ED Notes (Signed)
Bed: RESB Expected date:  Expected time:  Means of arrival:  Comments: EMS 23 to overdose on unknown amount of Prozac at unk time

## 2013-11-04 NOTE — ED Notes (Signed)
NO EKG performed on this patient; wrong CSN number entered and was resulted to wrong chart

## 2013-11-04 NOTE — ED Notes (Signed)
Patient arrives via [facility] and [place] [facility] for overdose. Patient reports taking unknown amount of  Fluoxetine at unknown time. Patient is SI, A&O x4.

## 2013-11-04 NOTE — ED Notes (Signed)
Patient reports LUQ abominal pain with nausea, no vomiting. Patient reports hearing voices. "I can't tell you what they told me because they would hurt me". Patient denies HI at this time.

## 2013-11-05 NOTE — Discharge Summary (Signed)
Physician Discharge Summary Note  Patient:  [name] is an 23 y.o., male MRN:  [id number] DOB:  January 20, 1991 Patient phone:  [phone] (home)  Patient address:   [address] [place] [address],  Total Time spent with patient: 30 minutes  Date of Admission:  10/09/2013 Date of Discharge: 10/13/13  Reason for Admission:  Psychosis, Aggression  Discharge Diagnoses: Principal Problem:   Schizoaffective disorder Active Problems:   Drug overdose, intentional   Psychiatric Specialty Exam: Physical Exam  Review of Systems  Constitutional: Negative.   HENT: Negative.   Eyes: Negative.   Respiratory: Negative.   Cardiovascular: Negative.   Gastrointestinal: Negative.   Genitourinary: Negative.   Musculoskeletal: Negative.   Skin: Negative.   Neurological: Negative.   Endo/Heme/Allergies: Negative.   Psychiatric/Behavioral: Negative.     Blood pressure 97/59, pulse 96, temperature 97.8 F (36.6 C), temperature source Oral, resp. rate 18.There is no weight on file to calculate BMI.  See Physician SRA   General Appearance: Fairly Groomed   Eye Contact:: Good   Speech: Clear and Coherent and Normal Rate   Volume: Normal   Mood: Euthymic   Affect: Appropriate   Thought Process: Circumstantial   Orientation: Full (Time, Place, and Person)   Thought Content: Negative   Suicidal Thoughts: No   Homicidal Thoughts: No   Memory: Immediate; Fair  Recent; Fair  Remote; Fair   Judgement: Fair   Insight: marginal   Psychomotor Activity: Normal   Concentration: Fair   Recall: [employer] of Knowledge:Fair   Language: Good   Akathisia: No   Handed: Right   AIMS (if indicated):   Assets: Communication Skills  Desire for Improvement  Physical Health  Social Support   Sleep: Number of Hours: 6    Musculoskeletal:  Strength & Muscle Tone: within normal limits  Gait & Station: normal  Patient leans: N/A  DSM5:  AXIS I: Schizoaffective disorder   AXIS II: Cluster C Traits  AXIS III:  Past Medical History   Diagnosis  Date   .  Asthma    .  Bipolar 1 disorder    .  ODD (oppositional defiant disorder)    .  ADHD (attention deficit hyperactivity disorder)    .  Schizophrenia     AXIS IV: other psychosocial or environmental problems and problems related to social environment  AXIS V:  moderate symptoms   Level of Care:  OP  Hospital Course: [name] is a 23 y.o. male patient admitted with intentional suicide attempt. Patient overdosed with intent to hurt himself. Forwards little on assessment, positive for hallucinations. Denies homicidal ideation. Pt refused to speak to myself or Dr. Dub Mikes, stating "I want to talk to a male only" and "I said I want a male instead". Pt has a history of manipulative behaviors inpatient and did not cite any concerns about speaking to male providers when he was inpatient in June 2015.   During Hospitalization: Medications managed, psychoeducation, group and individual therapy.  At discharge, pt rates anxiety and depression as moderate. Pt is likely at baseline at this time and will followup with outpatient treatment. Affirms agreement with medication regimen and discharge plan.  Consults:  None  Significant Diagnostic Studies:  Routine admission labs, Tegretol level, UDS negative   Discharge Vitals:   Blood pressure 97/59, pulse 96, temperature 97.8 F (36.6 C), temperature source Oral, resp. rate 18. There is no weight on file to calculate BMI. Lab Results:  No results found for this or any previous visit (from the past 72 hour(s)).  Physical Findings: AIMS: Facial and Oral Movements Muscles of Facial Expression: None, normal Lips and Perioral Area: None, normal Jaw: None, normal Tongue: None, normal,Extremity Movements Upper (arms, wrists, hands, fingers): None, normal Lower (legs, knees, ankles, toes): None, normal, Trunk Movements Neck, shoulders, hips: None, normal, Overall  Severity Severity of abnormal movements (highest score from questions above): None, normal Incapacitation due to abnormal movements: None, normal Patient's awareness of abnormal movements (rate only patient's report): No Awareness, Dental Status Current problems with teeth and/or dentures?: No Does patient usually wear dentures?: No  CIWA:    COWS:     Psychiatric Specialty Exam: See Psychiatric Specialty Exam and Suicide Risk Assessment completed by Attending Physician prior to discharge.  Discharge destination:  Home  Is patient on multiple antipsychotic therapies at discharge:  No   Has Patient had three or more failed trials of antipsychotic monotherapy by history:  No  Recommended Plan for Multiple Antipsychotic Therapies: NA     Medication List    STOP taking these medications       chlorproMAZINE 100 MG tablet  Commonly known as:  THORAZINE     FLUoxetine 10 MG capsule  Commonly known as:  PROZAC      TAKE these medications     Indication   benztropine 1 MG tablet  Commonly known as:  COGENTIN  Take 1 tablet (1 mg total) by mouth 2 (two) times daily.   Indication:  Extrapyramidal Reaction caused by Medications       Follow-up Information   Follow up with [facility] ACT team. (Will see you the day of d/c)    Contact information:   [address]  [place] [336] [phone]      Follow-up recommendations:   Activity: as tolerated  Diet: heart healthy    Comments:   Take all your medications as prescribed by your mental healthcare provider.  Report any adverse effects and or reactions from your medicines to your outpatient provider promptly.  Patient is instructed and cautioned to not engage in alcohol and or illegal drug use while on prescription medicines.  In the event of worsening symptoms, patient is instructed to call the crisis hotline, 911 and or go to the nearest ED for appropriate evaluation and treatment of symptoms.  Follow-up with your primary  care provider for your other medical issues, concerns and or health care needs.   Total Discharge Time:  Greater than 30 minutes.  Signed: Beau Fanny  FNP-BC  10/13/2013, 11:43 AM   Patient seen, evaluated and I agree with notes by Nurse Practitioner. Thedore Mins, MD

## 2013-11-05 NOTE — Progress Notes (Signed)
CSW received a call from [facility] [phone] who reports that she is his current coordinator.  She report that she will follow up with the family to offer needed assistance.       [name], MSW, [place], 11/05/2013 Evening Clinical Social Worker [phone]

## 2013-11-05 NOTE — ED Notes (Signed)
1 pair of black tennis shoes, 1 pair of jeans, 1 pair of boxers, 1 black [place], 1 pair of black sunglasses, 1 gray tank top in patient belongings and placed in patient locker.

## 2013-11-05 NOTE — ED Notes (Addendum)
Pt escorted to discharge window. Verbalized understanding discharge instructions. In no acute distress.  Pt given all belongings and a bus pass.

## 2013-11-05 NOTE — ED Notes (Signed)
TTS at bedside. 

## 2013-11-05 NOTE — ED Provider Notes (Signed)
9:50 AM I interpreted/reviewed the labs and/or imaging which were non-contributory.  Pt continues to appear well. Denying SI/HI. Would like to go home. TTS has evaluated and agrees. I was unable to get a hold of his ACT team. I doubt that he had an ingestion, but he has been monitored here for 14 hrs and continues to appear well.   9:50 AM:  I have discussed the diagnosis/risks/treatment options with the patient and believe the pt to be eligible for discharge home to follow-up with his ACT team. We also discussed returning to the ED immediately if new or worsening sx occur. We discussed the sx which are most concerning (e.g., further SI) that necessitate immediate return. Medications administered to the patient during their visit and any new prescriptions provided to the patient are listed below.  Medications given during this visit Medications  LORazepam (ATIVAN) tablet 1 mg (not administered)  ondansetron (ZOFRAN) tablet 4 mg (not administered)  alum & mag hydroxide-simeth (MAALOX/MYLANTA) 200-200-20 MG/5ML suspension 30 mL (not administered)    New Prescriptions   No medications on file   Clinical Impression 1. Suicidal ideations      Purvis Sheffield, MD 11/05/13 [phone]

## 2013-11-05 NOTE — ED Notes (Signed)
Patient belongings placed in locker 30.  

## 2013-11-05 NOTE — Progress Notes (Signed)
CSW met with pt to discuss pt's aftercare plans. Pt attempted to call mother but unable to reach her because phone turned off. Pt requested number to housing complex. Pt lives in a transitional housing community--[facility] [phone]), a program with [employer]. Pt did speak with Ms. Lelon Frohlich, a friend of his mother, and informed her of d/c. Ms. Ann unable to pick pt up.  Pt states he has fired his ACT team but he still sees a Teacher, music and a counselor Santiago Glad. CSW spoke with Sharyn Lull RN on ACT team and they state he is still a client. Sharyn Lull states that Venetia Constable is going to meet with the ACT team soon to discuss client's case and decide how to best care for pt--this meeting time is TBD. Per Sharyn Lull, team has tried to set up multiple meetings with pt mother but she has no-showed.   Pt d/c with a bus pass and bus schedule.  Rochele Pages,     ED CSW  phone: [phone]

## 2013-11-05 NOTE — BHH Counselor (Signed)
Writer met with pt. Pt denies SI. He denies HI and denies [facility]. No delusions noted. Writer asked pt re: access to pilsl and pt sts his mom gives him his meds. Pt reports he obtained the pills "from a friend". Discussed pt with Dr Lovena Le and Earleen Newport NP who report that pt can be d/c home. Pt tried to call his mom but he says her phone is off. Writer explained that a bus pass would be provided. Writer contacted CSW Maryruth Bun who is now at pt's bedside. Writer spoke w/ EDP Aline Brochure who is in agreement to d/c patient.   Arnold Long, [place] Assessment Counselor

## 2013-11-05 NOTE — ED Notes (Signed)
Pt is asking for his belongings.  Sts "I'm not IVC'd and I want to leave."  Romeo Apple MD notified.

## 2013-11-07 ENCOUNTER — Emergency Department (HOSPITAL_COMMUNITY)
Admission: EM | Admit: 2013-11-07 | Discharge: 2013-11-08 | Disposition: A | Discharge status: Home / Self Care | Payer: Medicaid Other | Attending: Emergency Medicine | Admitting: Emergency Medicine

## 2013-11-07 ENCOUNTER — Encounter (HOSPITAL_COMMUNITY): Payer: Self-pay | Admitting: Emergency Medicine

## 2013-11-07 DIAGNOSIS — Z79899 Other long term (current) drug therapy: Secondary | ICD-10-CM | POA: Insufficient documentation

## 2013-11-07 DIAGNOSIS — F259 Schizoaffective disorder, unspecified: Secondary | ICD-10-CM | POA: Diagnosis present

## 2013-11-07 DIAGNOSIS — F319 Bipolar disorder, unspecified: Secondary | ICD-10-CM | POA: Insufficient documentation

## 2013-11-07 DIAGNOSIS — R45850 Homicidal ideations: Secondary | ICD-10-CM | POA: Diagnosis not present

## 2013-11-07 DIAGNOSIS — F209 Schizophrenia, unspecified: Secondary | ICD-10-CM | POA: Insufficient documentation

## 2013-11-07 DIAGNOSIS — J45909 Unspecified asthma, uncomplicated: Secondary | ICD-10-CM | POA: Insufficient documentation

## 2013-11-07 DIAGNOSIS — R443 Hallucinations, unspecified: Secondary | ICD-10-CM

## 2013-11-07 DIAGNOSIS — R45851 Suicidal ideations: Secondary | ICD-10-CM | POA: Insufficient documentation

## 2013-11-07 NOTE — ED Notes (Addendum)
Patient states he is homicidal and suicidal. Patient states he has auditory and visual hallucinations. States he is homicidal towards his girlfriend that he states lives in [place]. Patient states "[name]" (one of the voices he hears) tells him to crush up his medications and put them in water and give it to his girlfriend. Patient speaking rapidly. Accompanied by mobile crisis team member.

## 2013-11-08 ENCOUNTER — Encounter (HOSPITAL_COMMUNITY): Payer: Self-pay | Admitting: Registered Nurse

## 2013-11-08 DIAGNOSIS — F259 Schizoaffective disorder, unspecified: Secondary | ICD-10-CM

## 2013-11-08 LAB — CBC
HCT: 45.1 % (ref 39.0–52.0)
Hemoglobin: 15.6 g/dL (ref 13.0–17.0)
MCH: 27.7 pg (ref 26.0–34.0)
MCHC: 34.6 g/dL (ref 30.0–36.0)
MCV: 80 fL (ref 78.0–100.0)
PLATELETS: 329 10*3/uL (ref 150–400)
RBC: 5.64 MIL/uL (ref 4.22–5.81)
RDW: 13.1 % (ref 11.5–15.5)
WBC: 5.2 10*3/uL (ref 4.0–10.5)

## 2013-11-08 LAB — COMPREHENSIVE METABOLIC PANEL
ALBUMIN: 4.2 g/dL (ref 3.5–5.2)
ALT: 30 U/L (ref 0–53)
AST: 25 U/L (ref 0–37)
Alkaline Phosphatase: 76 U/L (ref 39–117)
Anion gap: 15 (ref 5–15)
BUN: 7 mg/dL (ref 6–23)
CALCIUM: 9.9 mg/dL (ref 8.4–10.5)
CO2: 25 meq/L (ref 19–32)
Chloride: 102 mEq/L (ref 96–112)
Creatinine, Ser: 0.8 mg/dL (ref 0.50–1.35)
GFR calc Af Amer: >90 mL/min (ref >90)
GFR calc non Af Amer: >90 mL/min (ref >90)
Glucose, Bld: 95 mg/dL (ref 70–99)
Potassium: 4.2 mEq/L (ref 3.7–5.3)
SODIUM: 142 meq/L (ref 137–147)
Total Bilirubin: 0.3 mg/dL (ref 0.3–1.2)
Total Protein: 8.2 g/dL (ref 6.0–8.3)

## 2013-11-08 LAB — RAPID URINE DRUG SCREEN, HOSP PERFORMED
AMPHETAMINES: NOT DETECTED
BENZODIAZEPINES: NOT DETECTED
Barbiturates: NOT DETECTED
COCAINE: NOT DETECTED
Opiates: NOT DETECTED
Tetrahydrocannabinol: NOT DETECTED

## 2013-11-08 LAB — ACETAMINOPHEN LEVEL: Acetaminophen (Tylenol), Serum: <15 ug/mL (ref 10–30)

## 2013-11-08 LAB — SALICYLATE LEVEL

## 2013-11-08 LAB — ETHANOL

## 2013-11-08 MED ORDER — ZOLPIDEM TARTRATE 5 MG PO TABS: 5.0000 mg | ORAL_TABLET | Freq: Every evening | ORAL | Status: DC | PRN

## 2013-11-08 MED ORDER — ALBUTEROL SULFATE HFA 108 (90 BASE) MCG/ACT IN AERS: 1.0000 | INHALATION_SPRAY | Freq: Four times a day (QID) | RESPIRATORY_TRACT | Status: DC | PRN

## 2013-11-08 MED ORDER — LORAZEPAM 1 MG PO TABS
1.0000 mg | ORAL_TABLET | Freq: Three times a day (TID) | ORAL | Status: DC | PRN
  Administered 2013-11-08: 1 mg via ORAL
  Filled 2013-11-08: qty 1

## 2013-11-08 MED ORDER — DIVALPROEX SODIUM ER 500 MG PO TB24
500.0000 mg | ORAL_TABLET | Freq: Two times a day (BID) | ORAL | Status: DC
  Administered 2013-11-08 (×2): 500 mg via ORAL
  Filled 2013-11-08 (×3): qty 1

## 2013-11-08 MED ORDER — ACETAMINOPHEN 325 MG PO TABS: 650.0000 mg | ORAL_TABLET | ORAL | Status: DC | PRN

## 2013-11-08 MED ORDER — CHLORPROMAZINE HCL 25 MG PO TABS
100.0000 mg | ORAL_TABLET | Freq: Two times a day (BID) | ORAL | Status: DC
  Administered 2013-11-08 (×2): 100 mg via ORAL
  Filled 2013-11-08 (×2): qty 4

## 2013-11-08 MED ORDER — IBUPROFEN 200 MG PO TABS: 600.0000 mg | ORAL_TABLET | Freq: Three times a day (TID) | ORAL | Status: DC | PRN

## 2013-11-08 MED ORDER — TRAZODONE HCL 50 MG PO TABS: 50.0000 mg | ORAL_TABLET | Freq: Every evening | ORAL | Status: DC | PRN

## 2013-11-08 MED ORDER — ONDANSETRON HCL 4 MG PO TABS: 4.0000 mg | ORAL_TABLET | Freq: Three times a day (TID) | ORAL | Status: DC | PRN

## 2013-11-08 MED ORDER — ARIPIPRAZOLE 10 MG PO TABS
10.0000 mg | ORAL_TABLET | Freq: Every day | ORAL | Status: DC
  Administered 2013-11-08: 10 mg via ORAL
  Filled 2013-11-08: qty 1

## 2013-11-08 MED ORDER — BENZTROPINE MESYLATE 1 MG PO TABS
1.0000 mg | ORAL_TABLET | Freq: Two times a day (BID) | ORAL | Status: DC
  Administered 2013-11-08 (×2): 1 mg via ORAL
  Filled 2013-11-08 (×2): qty 1

## 2013-11-08 NOTE — ED Provider Notes (Signed)
Medical screening examination/treatment/procedure(s) were performed by non-physician practitioner and as supervising physician I was immediately available for consultation/collaboration.   EKG Interpretation None        Courtney F Horton, MD 11/08/13 0711 

## 2013-11-08 NOTE — BH Assessment (Signed)
Received a call for a tele-assessment. Spoke with  Sharilyn Sites, PA-C who reported that pt is present tin the ED with a member of his ACT Team however she did not feel safe transporting pt to the ED and she contacted [facility] to escort him there. Informed Sharilyn Sites, PA-C that the ACT Team should assess pt first and if the symptoms are emergent then TTS will complete an evaluation according to his care plan.

## 2013-11-08 NOTE — ED Notes (Signed)
TTS tele consult at bedside

## 2013-11-08 NOTE — ED Notes (Signed)
Patient states he is no longer with [facility] and [facility], states "I fired them 2-3 weeks ago.". Patient was advised until [name] confirms this he will have to wait for someone from [facility] to come see him. Patient states "Can I just sign myself out?" Patient was advised he may not sign himself out due to expressing both homicidal and suicidal ideations.

## 2013-11-08 NOTE — Discharge Instructions (Signed)
Depression Depression is feeling sad, low, down in the dumps, blue, gloomy, or empty. In general, there are two kinds of depression:  Normal sadness or grief. This can happen after something upsetting. It often goes away on its own within 2 weeks. After losing a loved one (bereavement), normal sadness and grief may last longer than two weeks. It usually gets better with time.  Clinical depression. This kind lasts longer than normal sadness or grief. It keeps you from doing the things you normally do in life. It is often hard to function at home, work, or at school. It may affect your relationships with others. Treatment is often needed. GET HELP RIGHT AWAY IF:  You have thoughts about hurting yourself or others.  You lose touch with reality (psychotic symptoms). You may:  See or hear things that are not real.  Have untrue beliefs about your life or people around you.  Your medicine is giving you problems. MAKE SURE YOU:  Understand these instructions.  Will watch your condition.  Will get help right away if you are not doing well or get worse. Document Released: 03/17/2010 Document Revised: 06/29/2013 Document Reviewed: 06/14/2011 [facility] Patient Information 2015 [place], [place]. This information is not intended to replace advice given to you by your health care provider. Make sure you discuss any questions you have with your health care provider.  Schizoaffective Disorder Schizoaffective disorder (ScAD) is a mental illness. It causes symptoms that are a mixture of schizophrenia (a psychotic disorder) and an affective (mood) disorder. The schizophrenic symptoms may include delusions, hallucinations, or odd behavior. The mood symptoms may be similar to major depression or bipolar disorder. ScAD may interfere with personal relationships or normal daily activities. People with ScAD are at increased risk for job loss, social isolation,physical health problems, anxiety and substance use  disorders, and suicide. ScAD usually occurs in cycles. Periods of severe symptoms are followed by periods of less severe symptoms or improvement. The illness affects men and women equally but usually appears at an earlier age (teenage or early adult years) in men. People who have family members with schizophrenia, bipolar disorder, or ScAD are at higher risk of developing ScAD. SYMPTOMS  At any one time, people with ScAD may have psychotic symptoms only or both psychotic and mood symptoms. The psychotic symptoms include one or more of the following:  Hearing, seeing, or feeling things that are not there (hallucinations).   Having fixed, false beliefs (delusions). The delusions usually are of being attacked, harassed, cheated, persecuted, or conspired against (paranoid delusions).  Speaking in a way that makes no sense to others (disorganized speech). The psychotic symptoms of ScAD may also include confusing or odd behavior or any of the negative symptoms of schizophrenia. These include loss of motivation for normal daily activities, such as bathing or grooming, withdrawal from other people, and lack of emotions.  The mood symptoms of ScAD occur more often than not. They resemble major depressive disorder or bipolar mania. Symptoms of major depression include depressed mood and four or more of the following:  Loss of interest in usually pleasurable activities (anhedonia).  Sleeping more or less than normal.  Feeling worthless or excessively guilty.  Lack of energy or motivation.  Trouble concentrating.  Eating more or less than usual.  Thinking a lot about death or suicide. Symptoms of bipolar mania include abnormally elevated or irritable mood and increased energy or activity, plus three or more of the following:   More confidence than normal or feeling  that you are able to do anything (grandiosity).  Feeling rested with less sleep than normal.   Being easily distracted.    Talking more than usual or feeling pressured to keep talking.   Feeling that your thoughts are racing.  Engaging in high-risk activities such as buying sprees or foolish business decisions. DIAGNOSIS  ScAD is diagnosed through an assessment by your health care provider. Your health care provider will observe and ask questions about your thoughts, behavior, mood, and ability to function in daily life. Your health care provider may also ask questions about your medical history and use of drugs, including prescription medicines. Your health care provider may also order blood tests and imaging exams. Certain medical conditions and substances can cause symptoms that resemble ScAD. Your health care provider may refer you to a mental health specialist for evaluation.  ScAD is divided into two types. The depressive type is diagnosed if your mood symptoms are limited to major depression. The bipolar type is diagnosed if your mood symptoms are manic or a mixture of manic and depressive symptoms TREATMENT  ScAD is usually a lifelong illness. Long-term treatment is necessary. The following treatments are available:  Medicine. Different types of medicine are used to treat ScAD. The exact combination depends on the type and severity of your symptoms. Antipsychotic medicine is used to control psychotic symptomssuch as delusions, paranoia, and hallucinations. Mood stabilizers can even the highs and lows of bipolar manic mood swings. Antidepressant medicines are used to treat major depressive symptoms.  Counseling or talk therapy. Individual, group, or family counseling may be helpful in providing education, support, and guidance. Many people with ScAD also benefit from social skills and job skills (vocational) training. A combination of medicine and counseling is usually best for managing the disorder over time. A procedure in which electricity is applied to the brain through the scalp (electroconvulsive  therapy) may be used to treat people with severe manic symptoms that do not respond to medicine and counseling. HOME CARE INSTRUCTIONS   Take all your medicine as prescribed.  Check with your health care provider before starting new prescription or over-the-counter medicines.  Keep all follow up appointments with your health care provider. SEEK MEDICAL CARE IF:   If you are not able to take your medicines as prescribed.  If your symptoms get worse. SEEK IMMEDIATE MEDICAL CARE IF:   You have serious thoughts about hurting yourself or others. Document Released: 06/25/2006 Document Revised: 06/29/2013 Document Reviewed: 09/26/2012 [facility] Patient Information 2015 [place], [place]. This information is not intended to replace advice given to you by your health care provider. Make sure you discuss any questions you have with your health care provider.

## 2013-11-08 NOTE — ED Notes (Signed)
[facility] states no one was available to accept my call, message left to call back.

## 2013-11-08 NOTE — BH Assessment (Signed)
Assessment completed. Consulted Maryjean Morn, PA-C who recommended long term placement such as [facility]. Sharilyn Sites, PA-C has been notified of the recommendation.

## 2013-11-08 NOTE — ED Notes (Signed)
Patient eating paper towels. Patient discouraged from this behavior.
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
## 2013-11-08 NOTE — BH Assessment (Signed)
Left a voice message for [employer], [facility] Coordinator for a return phone call.

## 2013-11-08 NOTE — ED Notes (Signed)
Per Guadalupe Dawn (TTS) patient needs to be seen by [facility] prior to being assessed by TTS. [facility] being notified by both TTS and this Clinical research associate at this time.
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
## 2013-11-08 NOTE — ED Notes (Signed)
Patient squeezing hand sanitizer onto his hands and licking them. Hand sanitizer removed from room and doorway.

## 2013-11-08 NOTE — Progress Notes (Signed)
CSW spoke with Marcelino Duster with [name] team who refused to respond to the ED because she was instructed by her clinical director Sherrlyn Hock not to respond to the ED.  She stated that Sherrlyn Hock informed her that if any of their consumers come into the ED then it is up to the ED to assess not ACTT.  She reports that the patient had a treatment team meeting 2 weeks ago but he or his mother did not show.  She reports another meeting coming up with [facility] to develop a plan of care but she was not aware of the meeting date.    1:35pm- CSW spoke with Marcelino Duster with ACTT team to inform her that patient is being discharged home to follow up with the ACTT team.  Marcelino Duster suggested that the patient is given a bus pass to return home because she would not pick him up.  CSW and Marcelino Duster agreed that the patient is in need of more resources/services to assist him with maintaining in the community.  She reports that their agency is awaiting the meeting with [facility] for direction.   1:45pm- CSW informed patient's mother Dorisann Frames of the plans to discharge.  Mother was not comfortable to with allowing the patient to return home on the bus. Mother has agreed to pick the patient up around 4pm.  Mother encouraged CSW to continue to follow up with the referral made to [facility] for ACTT team services.  Mother denies communicating with the APS caseworker about guardianship options but is still willing to participate.     [name], MSW, [place], 11/08/2013 Evening Clinical Social Worker [phone]

## 2013-11-08 NOTE — ED Notes (Signed)
Patient arrived to unit, states he has HI towards his girlfriend and wants to stab her multiple times and then kill himself. Pt states he wants to do this because his girlfriend wants him to stop talking to his best friend who is a girl who he has known since he was a child. Pt noted to have scratches to bilateral forearms which he admits is self-inflicted. Pt unable to contract for safety. Sitter at bedside for 1:1 observation for safety. No s/s of distress noted at this time.

## 2013-11-08 NOTE — ED Notes (Signed)
Patient wanded by security, belongings searched. 1 bag secured at nurses station at this time.  

## 2013-11-08 NOTE — BH Assessment (Signed)
Contacted [facility] Crisis line and spoke with Marcelino Duster who reported that pt care plan was changed and the pt is to be assessed as usual. She was unable to provide any information in regards to when and who changed the care plan. This Clinical research associate reported that the original care plan is still in our system which states that the patient will be evaluated by his ACT Team prior to TTS evaluating him. This Clinical research associate requested the time and name of the clinician that will be out to assess the pt and was informed by Marcelino Duster that "no one will come out tonight".

## 2013-11-08 NOTE — [facility] Suicide Risk Assessment (Cosign Needed)
Suicide Risk Assessment  Discharge Assessment     Demographic Factors:  Male  Total Time spent with patient: 30 minutes  Psychiatric Specialty Exam:     Blood pressure 114/69, pulse 76, temperature 97.5 F (36.4 C), temperature source Oral, resp. rate 18, height 5' 10.5" (1.791 m), weight 108.863 kg (240 lb), SpO2 97.00%.Body mass index is 33.94 kg/(m^2).   General Appearance: Casual   Eye Contact:: Good   Speech: Clear and Coherent and Garbled   Volume: Normal   Mood: Anxious   Affect: Congruent   Thought Process: Circumstantial   Orientation: Full (Time, Place, and Person)   Thought Content: Rumination   Suicidal Thoughts: No   Homicidal Thoughts: No "Patient is now denying that he wants to kill his girlfriend and stating that he is ready to go home   Memory: Immediate; Good  Recent; Good  Remote; Good   Judgement: Fair   Insight: Fair   Psychomotor Activity: Normal   Concentration: Fair   Recall: Good   Fund of Knowledge:Fair   Language: Fair   Akathisia: No   Handed: Right   AIMS (if indicated):   Assets: Communication Skills  Desire for Improvement  Housing  Social Support   Sleep:   Musculoskeletal:  Strength & Muscle Tone: within normal limits  Gait & Station: normal  Patient leans: N/A    Mental Status Per Nursing Assessment::   On Admission:     Current Mental Status by Physician: Patient denies suicidal/homicidal ideation, psychosis, and paranoia  Loss Factors: NA  Historical Factors: NA  Risk Reduction Factors:   Living with another person, especially a relative and Positive social support  Continued Clinical Symptoms:  Previous Psychiatric Diagnoses and Treatments  Cognitive Features That Contribute To Risk:  Closed-mindedness    Suicide Risk:  Minimal: No identifiable suicidal ideation.  Patients presenting with no risk factors but with morbid ruminations; may be classified as minimal risk based on the severity of the depressive  symptoms  Discharge Diagnoses:  AXIS I: Schizoaffective Disorder  AXIS II: Deferred  AXIS III:  Past Medical History   Diagnosis  Date   .  Asthma    .  Bipolar 1 disorder    .  ODD (oppositional defiant disorder)    .  ADHD (attention deficit hyperactivity disorder)    .  Schizophrenia    .  Suicidal ideation    .  Homicidal ideation    .  Explosive personality disorder     AXIS IV: other psychosocial or environmental problems  AXIS V: 61-70 mild symptoms  Plan Of Care/Follow-up recommendations:  Activity:  as tolerated Diet:  as tolerated  Is patient on multiple antipsychotic therapies at discharge:  No   Has Patient had three or more failed trials of antipsychotic monotherapy by history:  No  Recommended Plan for Multiple Antipsychotic Therapies: NA    Andrell Bergeson, FNP-BC 11/08/2013, 4:50 PM

## 2013-11-08 NOTE — ED Notes (Signed)
Patient belongings placed in [facility] 33, 1 bag.

## 2013-11-08 NOTE — ED Provider Notes (Signed)
CSN: [id number]     Arrival date & time 11/07/13  2346 History   First MD Initiated Contact with Patient 11/08/13 0005     Chief Complaint  Patient presents with  . Suicidal  . Homicidal    "girlfriend"  . Hallucinations    visual and auditory     (Consider location/radiation/quality/duration/timing/severity/associated sxs/prior Treatment) The history is provided by the patient and medical records.   This is a 23 y.o. M history significant for asthma, bipolar disorder, ADD, ADHD, schizophrenia, explosive personality disorder, presenting to the ED for suicidal ideation, homicidal ideation, and visual hallucinations. Patient states his friends "[name] and [name]" her telling him to kill his girlfriend who lives in [place] anyway possible.  He states at times he does not want to do this, but feels he cannot control his thoughts.  He states he is very frustrated with this and wants to kill himself to escape it all.  He denies illict drug or EtOH use.  States he has not taken his medication since being in the ED 2 days ago. Mobile Crisis team member present with patient in ED-- states his current behavior is contradicting her reports and she felt unsafe driving him here in her own personal vehicle so called [employer] for transport.  Past Medical History  Diagnosis Date  . Asthma   . Bipolar 1 disorder   . ODD (oppositional defiant disorder)   . ADHD (attention deficit hyperactivity disorder)   . Schizophrenia   . Suicidal ideation   . Homicidal ideation   . Explosive personality disorder    History reviewed. No pertinent past surgical history. No family history on file. History  Substance Use Topics  . Smoking status: Never Smoker   . Smokeless tobacco: Not on file  . Alcohol Use: Yes     Comment: sometimes    Review of Systems  Psychiatric/Behavioral: Positive for suicidal ideas and hallucinations.  All other systems reviewed and are negative.     Allergies  Carbamazepine;  Geodon; Haldol; Strattera; and Other  Home Medications   Prior to Admission medications   Medication Sig Start Date End Date Taking? Authorizing Provider  albuterol (PROVENTIL HFA;VENTOLIN HFA) 108 (90 BASE) MCG/ACT inhaler Inhale 1 puff into the lungs every 6 (six) hours as needed for wheezing or shortness of breath.    Historical Provider, MD  ARIPiprazole (ABILIFY MAINTENA) 400 MG SUSR Inject 400 mg into the muscle every 30 (thirty) days.    Historical Provider, MD  ARIPiprazole (ABILIFY) 10 MG tablet Take 10 mg by mouth daily.     Shuvon Rankin, NP  benztropine (COGENTIN) 1 MG tablet Take 1 tablet (1 mg total) by mouth 2 (two) times daily. 10/13/13   Beau Fanny, FNP  chlorproMAZINE (THORAZINE) 100 MG tablet Take 100 mg by mouth 2 (two) times daily.    Historical Provider, MD  divalproex (DEPAKOTE ER) 500 MG 24 hr tablet Take 500 mg by mouth 2 (two) times daily.    Historical Provider, MD  traZODone (DESYREL) 50 MG tablet Take 50 mg by mouth at bedtime as needed for sleep.    Historical Provider, MD   BP 127/57  Pulse 100  Temp(Src) 98.4 F (36.9 C) (Oral)  Resp 20  Ht 5' 10.5" (1.791 m)  Wt 240 lb (108.863 kg)  BMI 33.94 kg/m2  SpO2 100%  Physical Exam  Nursing note and vitals reviewed. Constitutional: He is oriented to person, place, and time. He appears well-developed and well-nourished. No distress.  HENT:  Head: Normocephalic and atraumatic.  Mouth/Throat: Oropharynx is clear and moist.  Eyes: Conjunctivae and EOM are normal. Pupils are equal, round, and reactive to light.  Neck: Normal range of motion. Neck supple.  Cardiovascular: Normal rate, regular rhythm and normal heart sounds.   Pulmonary/Chest: Effort normal and breath sounds normal. No respiratory distress. He has no wheezes.  Abdominal: Soft. Bowel sounds are normal. There is no tenderness. There is no guarding.  Musculoskeletal: Normal range of motion.  Neurological: He is alert and oriented to person,  place, and time.  Skin: Skin is warm and dry. He is not diaphoretic.  Psychiatric: He has a normal mood and affect. He is actively hallucinating. He expresses homicidal and suicidal ideation.    ED Course  Procedures (including critical care time) Labs Review Labs Reviewed  SALICYLATE LEVEL - Abnormal; Notable for the following:    Salicylate Lvl <2.0 (*)    All other components within normal limits  ACETAMINOPHEN LEVEL  CBC  COMPREHENSIVE METABOLIC PANEL  ETHANOL  URINE RAPID DRUG SCREEN (HOSP PERFORMED)    Imaging Review No results found.   EKG Interpretation None      MDM   Final diagnoses:  Suicidal ideation  Homicidal ideation  Hallucination   23 year old male with suicidal and homicidal ideation as well as auditory and visual hallucinations. Patient well known to the ED for similar complaints. His care plan indicates that his act team representative should be contacted, however unable to be reached at this time on several attempts.  Mobile crisis team member here with patient and uncomfortable with his current mental state. Lab work was obtained which is reassuring.  TTS has evaluated the patient and recommended long-term inpatient treatment.  Patient awaiting placement at this time.  Garlon Hatchet, PA-C 11/08/13 0530

## 2013-11-13 NOTE — ED Provider Notes (Signed)
CSN: [id number]     Arrival date & time 11/04/13  1945 History   First MD Initiated Contact with Patient 11/04/13 2000     Chief Complaint  Patient presents with  . Drug Overdose     (Consider location/radiation/quality/duration/timing/severity/associated sxs/prior Treatment) HPI Patient presents to the ER following an ingestion of pills because the voices told him to do it. The patient states that he took Fluoxetine. He is not very forthcoming with information and is more argumentative and challenging [place] rather than providing history. The patient is speaking very abstractly.  Past Medical History  Diagnosis Date  . Asthma   . Bipolar 1 disorder   . ODD (oppositional defiant disorder)   . ADHD (attention deficit hyperactivity disorder)   . Schizophrenia   . Suicidal ideation   . Homicidal ideation   . Explosive personality disorder    History reviewed. No pertinent past surgical history. No family history on file. History  Substance Use Topics  . Smoking status: Never Smoker   . Smokeless tobacco: Not on file  . Alcohol Use: Yes     Comment: sometimes    Review of Systems  Level caveat due to uncooperativeness  Allergies  Carbamazepine; Geodon; Haldol; Strattera; and Other  Home Medications   Prior to Admission medications   Medication Sig Start Date End Date Taking? Authorizing Provider  albuterol (PROVENTIL HFA;VENTOLIN HFA) 108 (90 BASE) MCG/ACT inhaler Inhale 1 puff into the lungs every 6 (six) hours as needed for wheezing or shortness of breath.   Yes Historical Provider, MD  ARIPiprazole (ABILIFY MAINTENA) 400 MG SUSR Inject 400 mg into the muscle every 30 (thirty) days.   Yes Historical Provider, MD  ARIPiprazole (ABILIFY) 10 MG tablet Take 10 mg by mouth daily.    Yes Shuvon Rankin, NP  benztropine (COGENTIN) 1 MG tablet Take 1 tablet (1 mg total) by mouth 2 (two) times daily. 10/13/13  Yes Beau Fanny, FNP  chlorproMAZINE (THORAZINE) 100 MG tablet Take 100 mg  by mouth 2 (two) times daily.   Yes Historical Provider, MD  divalproex (DEPAKOTE ER) 500 MG 24 hr tablet Take 500 mg by mouth 2 (two) times daily.   Yes Historical Provider, MD  traZODone (DESYREL) 50 MG tablet Take 50 mg by mouth at bedtime as needed for sleep.   Yes Historical Provider, MD   BP 117/67  Pulse 79  Temp(Src) 97.7 F (36.5 C) (Oral)  Resp 16  SpO2 100% Physical Exam  Constitutional: He appears well-developed and well-nourished. No distress.  HENT:  Head: Normocephalic and atraumatic.  Eyes: Pupils are equal, round, and reactive to light.  Cardiovascular: Normal rate, regular rhythm and normal heart sounds.   Pulmonary/Chest: Effort normal and breath sounds normal.  Abdominal: Soft. Bowel sounds are normal. He exhibits no distension. There is no tenderness.  Neurological: He is alert.  Skin: Skin is warm and dry.  Psychiatric: His affect is angry, blunt and inappropriate. He is agitated, aggressive, hyperactive and combative. He is not slowed, not withdrawn and not actively hallucinating. Thought content is paranoid. Cognition and memory are not impaired. He does not express impulsivity or inappropriate judgment. He expresses no homicidal ideation. He is attentive.    ED Course  Procedures (including critical care time) Labs Review Labs Reviewed  BASIC METABOLIC PANEL - Abnormal; Notable for the following:    BUN 5 (*)    All other components within normal limits  SALICYLATE LEVEL - Abnormal; Notable for the following:  Salicylate Lvl <2.0 (*)    All other components within normal limits  CBC WITH DIFFERENTIAL  ETHANOL  URINE RAPID DRUG SCREEN (HOSP PERFORMED)  ACETAMINOPHEN LEVEL  HEPATIC FUNCTION PANEL  PROTIME-INR    Imaging Review No results found.   EKG Interpretation   Date/Time:  Wednesday November 04 2013 20:25:11 EDT Ventricular Rate:  88 PR Interval:  163 QRS Duration: 82 QT Interval:  350 QTC Calculation: 423 R Axis:   65 Text  Interpretation:  Sinus rhythm When compared with ECG of 11/03/2013 No  significant change was found Confirmed by [facility]  MD, Nicholos Johns [phone])  on 11/04/2013 9:45:17 PM      MDM   Final diagnoses:  Suicidal ideations     The patient will need TTS eval. Due to the fact that he took pills and is hallucinating  Carlyle Dolly, PA-C 11/13/13 1811

## 2013-11-14 NOTE — ED Provider Notes (Signed)
Medical screening examination/treatment/procedure(s) were performed by non-physician practitioner and as supervising physician I was immediately available for consultation/collaboration.   EKG Interpretation   Date/Time:  Wednesday November 04 2013 20:25:11 EDT Ventricular Rate:  88 PR Interval:  163 QRS Duration: 82 QT Interval:  350 QTC Calculation: 423 R Axis:   65 Text Interpretation:  Sinus rhythm When compared with ECG of 11/03/2013 No  significant change was found Confirmed by [facility]  MD, Berna Gitto ([id number])  on 11/04/2013 9:45:17 PM        Samuel Jester, DO 11/14/13 1550

## 2013-11-18 ENCOUNTER — Encounter (HOSPITAL_COMMUNITY): Payer: Self-pay | Admitting: Emergency Medicine

## 2013-11-18 ENCOUNTER — Emergency Department (HOSPITAL_COMMUNITY)
Admission: EM | Admit: 2013-11-18 | Discharge: 2013-11-20 | Disposition: A | Discharge status: Other Acute Inpatient Hospital | Payer: Medicaid Other | Attending: Emergency Medicine | Admitting: Emergency Medicine

## 2013-11-18 DIAGNOSIS — H5316 Psychophysical visual disturbances: Secondary | ICD-10-CM | POA: Diagnosis not present

## 2013-11-18 DIAGNOSIS — R45851 Suicidal ideations: Secondary | ICD-10-CM | POA: Diagnosis not present

## 2013-11-18 DIAGNOSIS — F259 Schizoaffective disorder, unspecified: Secondary | ICD-10-CM | POA: Diagnosis not present

## 2013-11-18 DIAGNOSIS — R4689 Other symptoms and signs involving appearance and behavior: Secondary | ICD-10-CM

## 2013-11-18 DIAGNOSIS — R45850 Homicidal ideations: Secondary | ICD-10-CM | POA: Diagnosis not present

## 2013-11-18 LAB — CBC WITH DIFFERENTIAL/PLATELET
BASOS ABS: 0 10*3/uL (ref 0.0–0.1)
BASOS PCT: 0 % (ref 0–1)
EOS ABS: 0.1 10*3/uL (ref 0.0–0.7)
EOS PCT: 1 % (ref 0–5)
HCT: 46.2 % (ref 39.0–52.0)
Hemoglobin: 16 g/dL (ref 13.0–17.0)
Lymphocytes Relative: 29 % (ref 12–46)
Lymphs Abs: 2.1 10*3/uL (ref 0.7–4.0)
MCH: 27.8 pg (ref 26.0–34.0)
MCHC: 34.6 g/dL (ref 30.0–36.0)
MCV: 80.3 fL (ref 78.0–100.0)
Monocytes Absolute: 0.5 10*3/uL (ref 0.1–1.0)
Monocytes Relative: 7 % (ref 3–12)
Neutro Abs: 4.4 10*3/uL (ref 1.7–7.7)
Neutrophils Relative %: 63 % (ref 43–77)
PLATELETS: 328 10*3/uL (ref 150–400)
RBC: 5.75 MIL/uL (ref 4.22–5.81)
RDW: 13.3 % (ref 11.5–15.5)
WBC: 7 10*3/uL (ref 4.0–10.5)

## 2013-11-18 NOTE — ED Notes (Signed)
Security wanded pt. at triage. 

## 2013-11-18 NOTE — ED Notes (Signed)
Pt. reports suicidal ideation - plans to jump from a bridge , denies visual and auditory hallucinations . Plan of care and process explained to pt. at triage . Respirations unlabored / alert and oriented / denies pain /ambulatory.

## 2013-11-18 NOTE — ED Notes (Signed)
Staffing office/ charge nurse notified for pt.'s sitter. Paper scrubs  given to pt.

## 2013-11-18 NOTE — ED Notes (Signed)
Pt will not let me stick him he stated he would like a male to stick him

## 2013-11-18 NOTE — ED Provider Notes (Signed)
CSN: [id number]     Arrival date & time 11/18/13  2002 History   First MD Initiated Contact with Patient 11/18/13 2300     Chief Complaint  Patient presents with  . Suicidal     (Consider location/radiation/quality/duration/timing/severity/associated sxs/prior Treatment) Patient is a 23 y.o. male presenting with mental health disorder. The history is provided by the patient. No language interpreter was used.  Mental Health Problem Presenting symptoms: suicidal thoughts   Presenting symptoms: no agitation   Presenting symptoms comment:  Possible suicide attempt Degree of incapacity (severity):  Severe Onset quality:  Unable to specify Timing:  Constant Progression:  Unchanged Chronicity:  Chronic Treatment compliance:  Some of the time Relieved by:  Nothing Worsened by:  Nothing tried Ineffective treatments:  Antipsychotics Associated symptoms: abdominal pain   Associated symptoms: no appetite change, no chest pain, no fatigue and no headaches   Risk factors: hx of mental illness, hx of suicide attempts and recent psychiatric admission     Past Medical History  Diagnosis Date  . Asthma   . Bipolar 1 disorder   . ODD (oppositional defiant disorder)   . ADHD (attention deficit hyperactivity disorder)   . Schizophrenia   . Suicidal ideation   . Homicidal ideation   . Explosive personality disorder    History reviewed. No pertinent past surgical history. No family history on file. History  Substance Use Topics  . Smoking status: Never Smoker   . Smokeless tobacco: Not on file  . Alcohol Use: Yes     Comment: sometimes    Review of Systems  Constitutional: Negative for fever, activity change, appetite change and fatigue.  HENT: Negative for congestion, facial swelling, rhinorrhea and trouble swallowing.   Eyes: Negative for photophobia and pain.  Respiratory: Negative for cough, chest tightness and shortness of breath.   Cardiovascular: Negative for chest pain and leg  swelling.  Gastrointestinal: Positive for abdominal pain. Negative for nausea, vomiting, diarrhea and constipation.  Endocrine: Negative for polydipsia and polyuria.  Genitourinary: Negative for dysuria, urgency, decreased urine volume and difficulty urinating.  Musculoskeletal: Negative for back pain and gait problem.  Skin: Negative for color change, rash and wound.  Allergic/Immunologic: Negative for immunocompromised state.  Neurological: Negative for dizziness, facial asymmetry, speech difficulty, weakness, numbness and headaches.  Psychiatric/Behavioral: Positive for suicidal ideas. Negative for confusion, decreased concentration and agitation.      Allergies  Carbamazepine; Geodon; Haldol; Strattera; and Other  Home Medications   Prior to Admission medications   Medication Sig Start Date End Date Taking? Authorizing Provider  albuterol (PROVENTIL HFA;VENTOLIN HFA) 108 (90 BASE) MCG/ACT inhaler Inhale 1 puff into the lungs every 6 (six) hours as needed for wheezing or shortness of breath.   Yes Historical Provider, MD  ARIPiprazole (ABILIFY MAINTENA) 400 MG SUSR Inject 400 mg into the muscle every 30 (thirty) days.   Yes Historical Provider, MD  ARIPiprazole (ABILIFY) 10 MG tablet Take 10 mg by mouth daily.    Yes Shuvon Rankin, NP  benztropine (COGENTIN) 1 MG tablet Take 1 tablet (1 mg total) by mouth 2 (two) times daily. 10/13/13  Yes Beau Fanny, FNP  chlorproMAZINE (THORAZINE) 100 MG tablet Take 100 mg by mouth 2 (two) times daily.   Yes Historical Provider, MD  divalproex (DEPAKOTE ER) 500 MG 24 hr tablet Take 500 mg by mouth 2 (two) times daily.   Yes Historical Provider, MD  traZODone (DESYREL) 50 MG tablet Take 50 mg by mouth at bedtime as needed  for sleep.   Yes Historical Provider, MD   BP 127/58  Pulse 72  Temp(Src) 98.6 F (37 C) (Oral)  Resp 22  Ht 5' 10.5" (1.791 m)  Wt 245 lb (111.131 kg)  BMI 34.65 kg/m2  SpO2 98% Physical Exam  Constitutional: He is  oriented to person, place, and time. He appears well-developed and well-nourished. No distress.  HENT:  Head: Normocephalic and atraumatic.  Mouth/Throat: No oropharyngeal exudate.  Eyes: Pupils are equal, round, and reactive to light.  Neck: Normal range of motion. Neck supple.  Cardiovascular: Normal rate, regular rhythm and normal heart sounds.  Exam reveals no gallop and no friction rub.   No murmur heard. Pulmonary/Chest: Effort normal and breath sounds normal. No respiratory distress. He has no wheezes. He has no rales.  Abdominal: Soft. Bowel sounds are normal. He exhibits no distension and no mass. There is no tenderness. There is no rebound and no guarding.  Musculoskeletal: Normal range of motion. He exhibits no edema and no tenderness.  Neurological: He is alert and oriented to person, place, and time.  Skin: Skin is warm and dry.  Psychiatric: He has a normal mood and affect. His speech is normal and behavior is normal. He expresses suicidal ideation. He expresses suicidal plans.    ED Course  Procedures (including critical care time) Labs Review Labs Reviewed  SALICYLATE LEVEL - Abnormal; Notable for the following:    Salicylate Lvl <2.0 (*)    All other components within normal limits  URINE CULTURE  ETHANOL  URINE RAPID DRUG SCREEN (HOSP PERFORMED)  CBC WITH DIFFERENTIAL  COMPREHENSIVE METABOLIC PANEL  URINALYSIS, ROUTINE W REFLEX MICROSCOPIC  ACETAMINOPHEN LEVEL    Imaging Review No results found.   EKG Interpretation None      MDM   Final diagnoses:  Aggression  Suicidal ideation    Pt is a 23 y.o. male with Pmhx as above who presents with report of SI.  Pt told triage staff, he had plan to jump from a bridge. Pt initially told me that he tried to jump from a bridge and when I confronted him w/ the discrepancy, he changes the story several times finally saying he tried to jump from a bridge but his mother stopped him (goes between saying she restrained  him and they the thought of her kept him from jumping). He is not reporting AVH.  He states he signed out of [facility] in [place] today, but then became sad and depressed. He states he would stay if admitted to inpt treatment today. Pt has hx of malingering, and has many ED visits in past 6 months for SI/HI, AVH.  He has an ED care plan reccommending call to his ACT team. He is currently calm in room.    Unable to reach ACT team. Pt now yelling in room, threatening to leave so he can "get it over with".  IVC filed and will consult TTS.  ACT team reached by nursing, but stating pt has fired them and he will not been seen by their team.   Pt remains agitated, yelling at staff, disruptive, unwilling to follow instructions. He appears to have had minimal improvement with  IM ativan. Spoke w/ pharmacy about alternatives for agitation given his multiple medication allergies.  Will give mg IM Zyprexa after TTS eval.   TTS has seen pt, given he has persistent suicidal threats and also threats of violence against staff, psych will pursue placement of [facility].   0300 Pt sleeping  after ZYPREXA  IM.   0745 AM  Pt still resting comfortably at time of shift change.    Toy Cookey, MD 11/19/13 [phone]

## 2013-11-19 LAB — COMPREHENSIVE METABOLIC PANEL
ALBUMIN: 4.5 g/dL (ref 3.5–5.2)
ALT: 26 U/L (ref 0–53)
AST: 36 U/L (ref 0–37)
Alkaline Phosphatase: 79 U/L (ref 39–117)
Anion gap: 14 (ref 5–15)
BUN: 8 mg/dL (ref 6–23)
CALCIUM: 9.4 mg/dL (ref 8.4–10.5)
CO2: 27 mEq/L (ref 19–32)
Chloride: 99 mEq/L (ref 96–112)
Creatinine, Ser: 0.94 mg/dL (ref 0.50–1.35)
GFR calc Af Amer: >90 mL/min (ref >90)
GFR calc non Af Amer: >90 mL/min (ref >90)
Glucose, Bld: 97 mg/dL (ref 70–99)
Potassium: 3.9 mEq/L (ref 3.7–5.3)
SODIUM: 140 meq/L (ref 137–147)
TOTAL PROTEIN: 8.3 g/dL (ref 6.0–8.3)
Total Bilirubin: 0.5 mg/dL (ref 0.3–1.2)

## 2013-11-19 LAB — URINALYSIS, ROUTINE W REFLEX MICROSCOPIC
Bilirubin Urine: NEGATIVE
GLUCOSE, UA: NEGATIVE mg/dL
Hgb urine dipstick: NEGATIVE
Ketones, ur: NEGATIVE mg/dL
LEUKOCYTES UA: NEGATIVE
NITRITE: NEGATIVE
Protein, ur: NEGATIVE mg/dL
SPECIFIC GRAVITY, URINE: 1.02 (ref 1.005–1.030)
Urobilinogen, UA: 0.2 mg/dL (ref 0.0–1.0)
pH: 5.5 (ref 5.0–8.0)

## 2013-11-19 LAB — RAPID URINE DRUG SCREEN, HOSP PERFORMED
Amphetamines: NOT DETECTED
Barbiturates: NOT DETECTED
Benzodiazepines: NOT DETECTED
Cocaine: NOT DETECTED
OPIATES: NOT DETECTED
Tetrahydrocannabinol: NOT DETECTED

## 2013-11-19 LAB — SALICYLATE LEVEL: Salicylate Lvl: <2 mg/dL — ABNORMAL LOW (ref 2.8–20.0)

## 2013-11-19 LAB — ACETAMINOPHEN LEVEL: Acetaminophen (Tylenol), Serum: <15 ug/mL (ref 10–30)

## 2013-11-19 LAB — ETHANOL

## 2013-11-19 MED ORDER — ACETAMINOPHEN 325 MG PO TABS
650.0000 mg | ORAL_TABLET | ORAL | Status: DC | PRN
  Filled 2013-11-19: qty 2

## 2013-11-19 MED ORDER — LORAZEPAM 2 MG/ML IJ SOLN
4.0000 mg | Freq: Once | INTRAMUSCULAR | Status: DC
  Administered 2013-11-19: 4 mg via INTRAMUSCULAR
  Filled 2013-11-19: qty 2

## 2013-11-19 MED ORDER — DIVALPROEX SODIUM ER 500 MG PO TB24
500.0000 mg | ORAL_TABLET | Freq: Two times a day (BID) | ORAL | Status: DC
  Administered 2013-11-19 – 2013-11-20 (×3): 500 mg via ORAL
  Filled 2013-11-19 (×6): qty 1

## 2013-11-19 MED ORDER — ALBUTEROL SULFATE (2.5 MG/3ML) 0.083% IN NEBU
2.5000 mg | INHALATION_SOLUTION | Freq: Four times a day (QID) | RESPIRATORY_TRACT | Status: DC | PRN
  Filled 2013-11-19: qty 3

## 2013-11-19 MED ORDER — TRAZODONE HCL 50 MG PO TABS
50.0000 mg | ORAL_TABLET | Freq: Every evening | ORAL | Status: DC | PRN
  Administered 2013-11-20: 50 mg via ORAL
  Filled 2013-11-19: qty 1

## 2013-11-19 MED ORDER — IBUPROFEN 200 MG PO TABS
600.0000 mg | ORAL_TABLET | Freq: Three times a day (TID) | ORAL | Status: DC | PRN
  Administered 2013-11-20: 600 mg via ORAL
  Filled 2013-11-19: qty 3

## 2013-11-19 MED ORDER — DIPHENHYDRAMINE HCL 50 MG/ML IJ SOLN
50.0000 mg | Freq: Once | INTRAMUSCULAR | Status: DC
  Administered 2013-11-19: 50 mg via INTRAMUSCULAR
  Filled 2013-11-19: qty 1

## 2013-11-19 MED ORDER — LORAZEPAM 2 MG/ML IJ SOLN
2.0000 mg | Freq: Once | INTRAMUSCULAR | Status: DC
  Administered 2013-11-19: 2 mg via INTRAMUSCULAR
  Filled 2013-11-19: qty 1

## 2013-11-19 MED ORDER — BENZTROPINE MESYLATE 1 MG PO TABS
1.0000 mg | ORAL_TABLET | Freq: Two times a day (BID) | ORAL | Status: DC
  Administered 2013-11-20 (×2): 1 mg via ORAL
  Filled 2013-11-19 (×3): qty 1

## 2013-11-19 MED ORDER — ALBUTEROL SULFATE HFA 108 (90 BASE) MCG/ACT IN AERS: 1.0000 | INHALATION_SPRAY | Freq: Four times a day (QID) | RESPIRATORY_TRACT | Status: DC | PRN

## 2013-11-19 MED ORDER — CHLORPROMAZINE HCL 25 MG PO TABS
100.0000 mg | ORAL_TABLET | Freq: Two times a day (BID) | ORAL | Status: DC
  Administered 2013-11-19 – 2013-11-20 (×3): 100 mg via ORAL
  Filled 2013-11-19 (×4): qty 4

## 2013-11-19 MED ORDER — OLANZAPINE 10 MG IM SOLR
10.0000 mg | Freq: Once | INTRAMUSCULAR | Status: AC
  Administered 2013-11-19: 10 mg via INTRAMUSCULAR
  Filled 2013-11-19: qty 10

## 2013-11-19 MED ORDER — LORAZEPAM 2 MG/ML IJ SOLN: 2.0000 mg | Freq: Four times a day (QID) | INTRAMUSCULAR | Status: DC | PRN

## 2013-11-19 MED ORDER — ARIPIPRAZOLE 10 MG PO TABS
10.0000 mg | ORAL_TABLET | Freq: Every day | ORAL | Status: DC
  Administered 2013-11-19 – 2013-11-20 (×2): 10 mg via ORAL
  Filled 2013-11-19 (×2): qty 1

## 2013-11-19 MED ORDER — ONDANSETRON HCL 4 MG PO TABS: 4.0000 mg | ORAL_TABLET | Freq: Three times a day (TID) | ORAL | Status: DC | PRN

## 2013-11-19 NOTE — Treatment Plan (Signed)
Called [facility] at [phone] and left a message on Bernis's case manager, Lauralee Evener voicemail asking for her assistance with his case.  Ms. Arita Miss number is [phone].

## 2013-11-19 NOTE — ED Notes (Signed)
Dr Carmelina Dane here to see pt

## 2013-11-19 NOTE — ED Notes (Signed)
Patient arguing with security. Placed crayon in his mouth.

## 2013-11-19 NOTE — ED Notes (Signed)
Pt arrives to ED with c/o suicidal and homicidal ideation, after interview with EDP, pt informed that he would be IVC'd. Pt immediately began to raise voice and become confrontational with staff members, attempting to exit facility. Escorted back to room by security. Attempted to verbally de-escalate pt, not effective. Pt making verbal threats, clenching fists and displaying aggressive  body language. EDP notified of attemtped elope. See new orders.

## 2013-11-19 NOTE — ED Notes (Signed)
Patient was observed by Clinical research associate and [employer] hitting the wall. Patient has stressed his concern for being stranded if he is released tomorrow. He has been at the nurses desk cursing and making threats. He is pointing on the glass and cursing at the staff.

## 2013-11-19 NOTE — ED Notes (Signed)
Lab contacted to add on additional blood work.

## 2013-11-19 NOTE — ED Notes (Signed)
Bed: [facility] Expected date: 11/19/13 Expected time:  Means of arrival:  Comments: Hold for [facility] transfer

## 2013-11-19 NOTE — ED Notes (Signed)
Pt awake for about 10 minutes, ambulated with sitter to bathroom. Ambulated back to bed and is asleep at this time. Breakfast tray ordered.

## 2013-11-19 NOTE — BH Assessment (Signed)
Relayed results of assessment to [name], PA. Per [name], PA pt meets inpt criteria and is declined [facility] due to aggression and [facility] is at capacity. [facility] referral should be sought once 2 more declines are given.   Informed EDP, Dr. Micheline Maze of plan. She will have IVC paperwork faxed to [phone]. Pt has been declined [facility], and will be referred out for placement once his labs are back.  Multiple attempts to reach nurse were unsuccessful.    Clista Bernhardt, [facility] Triage Specialist 11/19/2013 2:12 AM

## 2013-11-19 NOTE — ED Notes (Signed)
Patient refused vital he asked" why do you need itPatent attorney explained that the nurse will need it for night time medications. Patient said" hell no and she can not fucking make me do it". Writer told Rn [employer]

## 2013-11-19 NOTE — ED Notes (Signed)
Pt calm and cooperative at this time, laying down in bed.
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
## 2013-11-19 NOTE — ED Notes (Signed)
Patient was given a cup with coke and ice.

## 2013-11-19 NOTE — ED Notes (Signed)
Pt continuing to make verbal threats to staff members, awake and alert at this time.

## 2013-11-19 NOTE — ED Notes (Signed)
Pt still awake and alert, continuing to curse and make threats at [facility] and security.

## 2013-11-19 NOTE — ED Notes (Signed)
Security and [employer] was called because patient is in the halls cursing and making threats. The TTS  Counselor had to come out and ask [name] to calm down because the patient she was assessing were getting upset.
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
## 2013-11-19 NOTE — ED Notes (Signed)
Pt sleeping. 

## 2013-11-19 NOTE — ED Notes (Signed)
Patient was sitting in dayroom when I went past to do my 15 rounding check and writer observed him scratching his left arm with his right nail to make himself bleed. Rn Aundra Millet was notified of the observance

## 2013-11-19 NOTE — ED Provider Notes (Signed)
Pt transfer from [facility] ED for SI/HI.  Has been assessed and is awaiting placement.  Results for orders placed during the hospital encounter of 11/18/13  ETHANOL      Result Value Ref Range   Alcohol, Ethyl (B) <11  0 - 11 mg/dL  URINE RAPID DRUG SCREEN (HOSP PERFORMED)      Result Value Ref Range   Opiates NONE DETECTED  NONE DETECTED   Cocaine NONE DETECTED  NONE DETECTED   Benzodiazepines NONE DETECTED  NONE DETECTED   Amphetamines NONE DETECTED  NONE DETECTED   Tetrahydrocannabinol NONE DETECTED  NONE DETECTED   Barbiturates NONE DETECTED  NONE DETECTED  CBC WITH DIFFERENTIAL      Result Value Ref Range   WBC 7.0  4.0 - 10.5 K/uL   RBC 5.75  4.22 - 5.81 MIL/uL   Hemoglobin 16.0  13.0 - 17.0 g/dL   HCT 16.1  09.6 - 04.5 %   MCV 80.3  78.0 - 100.0 fL   MCH 27.8  26.0 - 34.0 pg   MCHC 34.6  30.0 - 36.0 g/dL   RDW 40.9  81.1 - 91.4 %   Platelets 328  150 - 400 K/uL   Neutrophils Relative % 63  43 - 77 %   Neutro Abs 4.4  1.7 - 7.7 K/uL   Lymphocytes Relative 29  12 - 46 %   Lymphs Abs 2.1  0.7 - 4.0 K/uL   Monocytes Relative 7  3 - 12 %   Monocytes Absolute 0.5  0.1 - 1.0 K/uL   Eosinophils Relative 1  0 - 5 %   Eosinophils Absolute 0.1  0.0 - 0.7 K/uL   Basophils Relative 0  0 - 1 %   Basophils Absolute 0.0  0.0 - 0.1 K/uL  COMPREHENSIVE METABOLIC PANEL      Result Value Ref Range   Sodium 140  137 - 147 mEq/L   Potassium 3.9  3.7 - 5.3 mEq/L   Chloride 99  96 - 112 mEq/L   CO2 27  19 - 32 mEq/L   Glucose, Bld 97  70 - 99 mg/dL   BUN 8  6 - 23 mg/dL   Creatinine, Ser 7.82  0.50 - 1.35 mg/dL   Calcium 9.4  8.4 - 95.6 mg/dL   Total Protein 8.3  6.0 - 8.3 g/dL   Albumin 4.5  3.5 - 5.2 g/dL   AST 36  0 - 37 U/L   ALT 26  0 - 53 U/L   Alkaline Phosphatase 79  39 - 117 U/L   Total Bilirubin 0.5  0.3 - 1.2 mg/dL   GFR calc non Af Amer >90  >90 mL/min   GFR calc Af Amer >90  >90 mL/min   Anion gap 14  5 - 15  URINALYSIS, ROUTINE W REFLEX MICROSCOPIC      Result Value Ref  Range   Color, Urine YELLOW  YELLOW   APPearance CLEAR  CLEAR   Specific Gravity, Urine 1.020  1.005 - 1.030   pH 5.5  5.0 - 8.0   Glucose, UA NEGATIVE  NEGATIVE mg/dL   Hgb urine dipstick NEGATIVE  NEGATIVE   Bilirubin Urine NEGATIVE  NEGATIVE   Ketones, ur NEGATIVE  NEGATIVE mg/dL   Protein, ur NEGATIVE  NEGATIVE mg/dL   Urobilinogen, UA 0.2  0.0 - 1.0 mg/dL   Nitrite NEGATIVE  NEGATIVE   Leukocytes, UA NEGATIVE  NEGATIVE  ACETAMINOPHEN LEVEL      Result Value  Ref Range   Acetaminophen (Tylenol), Serum <15.0  10 - 30 ug/mL  SALICYLATE LEVEL      Result Value Ref Range   Salicylate Lvl <2.0 (*) 2.8 - 20.0 mg/dL   Dg Wrist Complete Left  10/24/2013   CLINICAL DATA:  Larey Seat 3 days ago.  Left wrist pain.  EXAM: LEFT WRIST - COMPLETE 3+ VIEW  COMPARISON:  None.  FINDINGS: There is no evidence of fracture or dislocation. There is no evidence of arthropathy or other focal bone abnormality. Soft tissues are unremarkable.  IMPRESSION: Negative.   Electronically Signed   By: Burman Nieves M.D.   On: 10/24/2013 06:46      Rolan Bucco, MD 11/19/13 1810

## 2013-11-19 NOTE — Progress Notes (Signed)
CSW spoke with pt's care coordinator, [employer]. Donell Beers confirms that pt fired [employer] ACT team by writing them an official notice firing them. Thus, pt does not currently have an ACT team.   Mariann Laster,     ED CSW  phone: [phone]

## 2013-11-19 NOTE — ED Notes (Signed)
Patient's mother would like to speak with psychiatry directly about the patient's behavior and plan of care. Recommended that Ms Keo call psychiatry after they have made rounds.  Rolene Course. Patient has signed consent form.

## 2013-11-19 NOTE — ED Notes (Signed)
Patient has been observed by the writer going in and out of patients room he is hard to be redirected

## 2013-11-19 NOTE — ED Notes (Signed)
Pt attempting to drown self in sink at this time.

## 2013-11-19 NOTE — ED Notes (Signed)
Pt awake and eating breakfast.

## 2013-11-19 NOTE — ED Notes (Signed)
Patient is being loud and argumentative on the unit. Disturbing other patients. Stating "I'm frustrated. I'm pissed. Why can't I go home?" Patient has been asked multiple times to remain quiet in the halls or to stay in his room.

## 2013-11-19 NOTE — ED Notes (Signed)
Patient was witnessed by security of picking up crayons and eating them.

## 2013-11-19 NOTE — ED Notes (Signed)
Pt willing to receive zyprexa injection with no males present in room, security and [employer] on standby.

## 2013-11-19 NOTE — ED Notes (Signed)
[facility] called to transport pt to [place] psych ed

## 2013-11-19 NOTE — ED Notes (Signed)
Pt sleeping at this time.

## 2013-11-19 NOTE — ED Notes (Signed)
Patient stated he was thirsty. Patient was brought water. Patient refused water and told this writer to take it back.

## 2013-11-19 NOTE — ED Notes (Addendum)
Pt continuing agitated behavior, continuing to make verbal threats with staff members. Making threats to leave facility.

## 2013-11-19 NOTE — ED Notes (Signed)
Pt sitting on bed at this time, singing to self.

## 2013-11-19 NOTE — Progress Notes (Signed)
Pt has been assessed and the recommendation is for inpatient treatment. SW acquired an authorization from [place] to refer pt to [facility] #[id number].    Derrell Lolling, MSW Clinical Social Worker [phone]
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
## 2013-11-19 NOTE — BH Assessment (Signed)
Called ACTT per ED CARE PLAN.   Per Almira Coaster at ACTT pt has discharged from ACTT.   Spoke with EDP, Dr. Micheline Maze prior to assessment. Pt presents with similar sx has he has for previous visits.   Requested TA equipment be placed in pt room for assessment.   TA to commence shortly.   Clista Bernhardt, [facility] Triage Specialist 11/19/2013 1:43 AM

## 2013-11-19 NOTE — BH Assessment (Signed)
Ed Care PLAN  Care Plan Guidelines: Regardless of whether or not a patient has a care plan, every ED patient is entitled to a Medical Screening Exam (MSE) per EMTALA to determine if an Emergency Medical Condition [facility]) exists. Care plans are guidelines, and do not constitute a standard of care or substitute for clinical judgment.  Patient Name: Mare Loan  Care Plan updated: Nelly Rout, MD 10/19/2013  Mental Health Provider: [facility] ACTT team  Community ACT Team contact info: [facility] ACTT crisis line is [phone]. Team Lead is Darrick Penna [phone], contact a team member if the patient presents with suicidal/homicidal ideations or hallucinations with command.    1^ Diagnosis: Schizoaffective D/O, Bipolar 1 D/O, Explosive Personality D/O, Suicidal ideations, Homicidal Ideations, Command Hallucinations   Background:  .    Patient is currently his own guardian living with his biological mother.   .    He has a history of 20 encounters with the [facility] since 07/01/2013 for similar chief complaints.   .    Patient is currently participating with [name] team as an enhanced community services providing a holistic scope of services.   .    Patient has noticeable malingering symptoms or behaviors that would include him requesting multiple Emergency visits.   .    The ACTT team has a psychiatrist, licensed clinical provider (LCSW, LPC, LCAS, or etc), and Herbalist, and an Tree surgeon to assist patient with crisis interventions, preventions, medication monitoring, and case management.  .    Patient presented to the Emergency Department with suicidal/homicidal ideations and auditory hallucinations with command. Psychiatry has felt no therapeutic benefit from prior inpatient psych stays.  Recently patient has been discharged from the emergency department and [facility] 4 times in the past 10 days.   Plan: .    Provide MSE as seen as possible after arrival  to ED. If patient presents to [facility], inform patient immediately that his ACTT team will be contacted for an evaluation of symptoms.  Patient will not be transferred to the Psych ED unit because that is a familiar comfortable environment for him.   .    Once Patient is assessed by his ACTT team, if his symptoms are identified as an Emergent need such as suicidal/homicidal ideations with intent and plan of harm to self or others he then will be assessed by ED psych staff.   .    Focus on presence or absence of objective signs/symptoms .    Upon evaluation, if Patient is not in an acute psychiatric crisis, immediately discharge patient.  Notify security for escort or law enforcement if necessary.   .    If no emergent medical or psychiatric conditions are present, discharge patient to follow up with his ACTT team provider [facility] [phone].

## 2013-11-20 ENCOUNTER — Encounter (HOSPITAL_COMMUNITY): Payer: Self-pay | Admitting: Emergency Medicine

## 2013-11-20 ENCOUNTER — Emergency Department (EMERGENCY_DEPARTMENT_HOSPITAL)
Admission: EM | Admit: 2013-11-20 | Discharge: 2013-11-21 | Disposition: A | Discharge status: Home / Self Care | Payer: Medicaid Other | Source: Home / Self Care | Attending: Emergency Medicine | Admitting: Emergency Medicine

## 2013-11-20 ENCOUNTER — Encounter (HOSPITAL_COMMUNITY): Payer: Self-pay | Admitting: Psychiatry

## 2013-11-20 DIAGNOSIS — F319 Bipolar disorder, unspecified: Secondary | ICD-10-CM

## 2013-11-20 DIAGNOSIS — Z79899 Other long term (current) drug therapy: Secondary | ICD-10-CM | POA: Insufficient documentation

## 2013-11-20 DIAGNOSIS — F259 Schizoaffective disorder, unspecified: Secondary | ICD-10-CM

## 2013-11-20 DIAGNOSIS — R45851 Suicidal ideations: Secondary | ICD-10-CM

## 2013-11-20 DIAGNOSIS — R45850 Homicidal ideations: Secondary | ICD-10-CM

## 2013-11-20 DIAGNOSIS — F209 Schizophrenia, unspecified: Secondary | ICD-10-CM | POA: Insufficient documentation

## 2013-11-20 DIAGNOSIS — J45909 Unspecified asthma, uncomplicated: Secondary | ICD-10-CM | POA: Insufficient documentation

## 2013-11-20 LAB — RAPID URINE DRUG SCREEN, HOSP PERFORMED
Amphetamines: NOT DETECTED
Barbiturates: NOT DETECTED
Benzodiazepines: NOT DETECTED
Cocaine: NOT DETECTED
Opiates: NOT DETECTED
Tetrahydrocannabinol: NOT DETECTED

## 2013-11-20 LAB — COMPREHENSIVE METABOLIC PANEL
ALT: 23 U/L (ref 0–53)
AST: 23 U/L (ref 0–37)
Albumin: 4 g/dL (ref 3.5–5.2)
Alkaline Phosphatase: 71 U/L (ref 39–117)
Anion gap: 13 (ref 5–15)
BUN: 9 mg/dL (ref 6–23)
CALCIUM: 9.1 mg/dL (ref 8.4–10.5)
CO2: 27 mEq/L (ref 19–32)
CREATININE: 1.04 mg/dL (ref 0.50–1.35)
Chloride: 101 mEq/L (ref 96–112)
GLUCOSE: 76 mg/dL (ref 70–99)
Potassium: 4.2 mEq/L (ref 3.7–5.3)
Sodium: 141 mEq/L (ref 137–147)
Total Bilirubin: 0.2 mg/dL — ABNORMAL LOW (ref 0.3–1.2)
Total Protein: 7.8 g/dL (ref 6.0–8.3)

## 2013-11-20 LAB — CBC
HCT: 44.3 % (ref 39.0–52.0)
Hemoglobin: 15.1 g/dL (ref 13.0–17.0)
MCH: 27.1 pg (ref 26.0–34.0)
MCHC: 34.1 g/dL (ref 30.0–36.0)
MCV: 79.4 fL (ref 78.0–100.0)
Platelets: 314 10*3/uL (ref 150–400)
RBC: 5.58 MIL/uL (ref 4.22–5.81)
RDW: 13.2 % (ref 11.5–15.5)
WBC: 5.6 10*3/uL (ref 4.0–10.5)

## 2013-11-20 LAB — URINE CULTURE
COLONY COUNT: NO GROWTH
Culture: NO GROWTH

## 2013-11-20 LAB — SALICYLATE LEVEL: Salicylate Lvl: <2 mg/dL — ABNORMAL LOW (ref 2.8–20.0)

## 2013-11-20 LAB — ACETAMINOPHEN LEVEL: Acetaminophen (Tylenol), Serum: 15 ug/mL (ref 10–30)

## 2013-11-20 MED ORDER — OLANZAPINE 10 MG IM SOLR
10.0000 mg | Freq: Once | INTRAMUSCULAR | Status: AC
  Administered 2013-11-20: 10 mg via INTRAMUSCULAR
  Filled 2013-11-20: qty 10

## 2013-11-20 MED ORDER — NICOTINE 21 MG/24HR TD PT24: 21.0000 mg | MEDICATED_PATCH | Freq: Every day | TRANSDERMAL | Status: DC

## 2013-11-20 MED ORDER — LORAZEPAM 2 MG/ML IJ SOLN: 2.0000 mg | Freq: Once | INTRAMUSCULAR | Status: DC

## 2013-11-20 MED ORDER — ONDANSETRON HCL 4 MG PO TABS: 4.0000 mg | ORAL_TABLET | Freq: Three times a day (TID) | ORAL | Status: DC | PRN

## 2013-11-20 MED ORDER — CHLORPROMAZINE HCL 25 MG PO TABS
100.0000 mg | ORAL_TABLET | Freq: Two times a day (BID) | ORAL | Status: DC
  Administered 2013-11-21: 100 mg via ORAL
  Filled 2013-11-20: qty 4

## 2013-11-20 MED ORDER — DIVALPROEX SODIUM ER 500 MG PO TB24
500.0000 mg | ORAL_TABLET | Freq: Two times a day (BID) | ORAL | Status: DC
  Administered 2013-11-21: 500 mg via ORAL
  Filled 2013-11-20 (×4): qty 1

## 2013-11-20 MED ORDER — ACETAMINOPHEN 325 MG PO TABS
650.0000 mg | ORAL_TABLET | ORAL | Status: DC | PRN
  Administered 2013-11-21: 650 mg via ORAL
  Filled 2013-11-20: qty 2

## 2013-11-20 MED ORDER — IBUPROFEN 200 MG PO TABS
600.0000 mg | ORAL_TABLET | Freq: Three times a day (TID) | ORAL | Status: DC | PRN
  Administered 2013-11-21: 600 mg via ORAL
  Filled 2013-11-20: qty 3

## 2013-11-20 MED ORDER — TRAZODONE HCL 50 MG PO TABS: 50.0000 mg | ORAL_TABLET | Freq: Every evening | ORAL | Status: DC | PRN

## 2013-11-20 MED ORDER — BENZTROPINE MESYLATE 1 MG PO TABS
1.0000 mg | ORAL_TABLET | Freq: Two times a day (BID) | ORAL | Status: DC
  Administered 2013-11-21: 1 mg via ORAL
  Filled 2013-11-20: qty 1

## 2013-11-20 MED ORDER — ARIPIPRAZOLE 10 MG PO TABS
10.0000 mg | ORAL_TABLET | Freq: Every day | ORAL | Status: DC
  Administered 2013-11-21: 10 mg via ORAL
  Filled 2013-11-20: qty 1

## 2013-11-20 MED ORDER — LORAZEPAM 2 MG/ML IJ SOLN
2.0000 mg | Freq: Once | INTRAMUSCULAR | Status: AC
  Administered 2013-11-20: 2 mg via INTRAMUSCULAR
  Filled 2013-11-20: qty 1

## 2013-11-20 MED ORDER — ZOLPIDEM TARTRATE 5 MG PO TABS: 5.0000 mg | ORAL_TABLET | Freq: Every evening | ORAL | Status: DC | PRN

## 2013-11-20 MED ORDER — ALBUTEROL SULFATE HFA 108 (90 BASE) MCG/ACT IN AERS
1.0000 | INHALATION_SPRAY | Freq: Four times a day (QID) | RESPIRATORY_TRACT | Status: DC | PRN
Start: 2013-11-20 — End: 2013-11-21

## 2013-11-20 MED ORDER — DIPHENHYDRAMINE HCL 50 MG/ML IJ SOLN
50.0000 mg | Freq: Once | INTRAMUSCULAR | Status: AC
  Administered 2013-11-20: 50 mg via INTRAMUSCULAR
  Filled 2013-11-20: qty 1

## 2013-11-20 MED ORDER — DIPHENHYDRAMINE HCL 50 MG/ML IJ SOLN: 50.0000 mg | Freq: Once | INTRAMUSCULAR | Status: DC

## 2013-11-20 MED ORDER — LORAZEPAM 1 MG PO TABS
1.0000 mg | ORAL_TABLET | Freq: Three times a day (TID) | ORAL | Status: DC | PRN
  Administered 2013-11-21: 1 mg via ORAL
  Filled 2013-11-20: qty 1

## 2013-11-20 NOTE — ED Notes (Signed)
Pt presents with [employer] from home, pt endorses SI/HI with plan to cut himself or drown himself. Pt endorses auditory and visual hallucinations, states voices are telling him to kill people. Pt states he is seeing usual visions of "kayla and [name]". Pt states he was seen here this am but did not tell staff he needed refill on his medication, pt states this is because  He was currently on medication.  **Careplan in place, ACTT called per Erlene Senters pt discharged himself from the program this week**

## 2013-11-20 NOTE — ED Notes (Addendum)
Abilify   And Depakote  givien. Rover went down in the middle of medication administration

## 2013-11-20 NOTE — ED Notes (Signed)
Pt has 2 bags of belongings (cellphone and ear buds).  Pt has been seen and wanded by security.  [facility]

## 2013-11-20 NOTE — ED Provider Notes (Signed)
Patient brought by police. He has been in the emergency department multiple times for "suicidal and homicidal ideation." Tonight he reports that he was suicidal and homicidal earlier today but no longer feels that way. He was seen by Dr.Jonnalagda of psychiatry earlier today and inpatient stay recommended. He was discharged by this practitioner possibly 2 hours after having been seen by the attending psychiatrist. I spoke with Mr. Maryjean Morn, this practitioner for psychiatry. Plan patient will stay tonight here and be reevaluated by psychiatry tomorrow  Doug Sou, MD 11/20/13 2230
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
## 2013-11-20 NOTE — ED Notes (Signed)
Patient

## 2013-11-20 NOTE — Progress Notes (Addendum)
11:43am. Pt's mother states she will pick pt up at 5pm this evening. She is unable to come before then due to medical issues.  CSW spoke with mother about [facility] ACT team. [facility] has appointment to complete intake interview with pt at 11am on Monday morning at pt's home. Mother verbalized understanding and states she will be there with pt for interview.   Mariann Laster,     ED CSW  phone: [phone]

## 2013-11-20 NOTE — ED Notes (Signed)
Patient has quieted and is in his bed at present.

## 2013-11-20 NOTE — ED Provider Notes (Signed)
CSN: [id number]     Arrival date & time 11/20/13  2010 History  This chart was scribed for non-physician practitioner, Antony Madura, PA-C, working with Doug Sou, MD, by Modena Jansky, ED Scribe. This patient was seen in room WTR4/WLPT4 and the patient's care was started at 10:00 PM.    Chief Complaint  Patient presents with  . Suicidal  . Homicidal   The history is provided by the patient and the police. No language interpreter was used.   HPI Comments: [name] is a 23 y.o. male who presents to the Emergency Department complaining of SI and HI. Police statest hat the pt called the police today and they brought him to the ED today. Pt reports some SI and Hi earlier but none currently. He reports that his HI was toward just about anyone. He states that he has no plan for suicide or homicide. He states that he has intermittent auditory and visual hallucinations of "[name] and [place]". He reports that he is currently not on any medications and needs a prescription. He denies any drug or alcohol use.    Past Medical History  Diagnosis Date  . Asthma   . Bipolar 1 disorder   . ODD (oppositional defiant disorder)   . ADHD (attention deficit hyperactivity disorder)   . Schizophrenia   . Suicidal ideation   . Homicidal ideation   . Explosive personality disorder    History reviewed. No pertinent past surgical history. No family history on file. History  Substance Use Topics  . Smoking status: Never Smoker   . Smokeless tobacco: Not on file  . Alcohol Use: Yes     Comment: sometimes    Review of Systems  Psychiatric/Behavioral: Positive for suicidal ideas and hallucinations.  All other systems reviewed and are negative.   Allergies  Carbamazepine; Geodon; Haldol; Strattera; and Other  Home Medications   Prior to Admission medications   Medication Sig Start Date End Date Taking? Authorizing Provider  albuterol (PROVENTIL HFA;VENTOLIN HFA) 108 (90 BASE) MCG/ACT inhaler  Inhale 1 puff into the lungs every 6 (six) hours as needed for wheezing or shortness of breath.   Yes Historical Provider, MD  ARIPiprazole (ABILIFY MAINTENA) 400 MG SUSR Inject 400 mg into the muscle every 30 (thirty) days.   Yes Historical Provider, MD  ARIPiprazole (ABILIFY) 10 MG tablet Take 10 mg by mouth daily.    Yes Shuvon Rankin, NP  benztropine (COGENTIN) 1 MG tablet Take 1 tablet (1 mg total) by mouth 2 (two) times daily. 10/13/13  Yes Beau Fanny, FNP  chlorproMAZINE (THORAZINE) 100 MG tablet Take 100 mg by mouth 2 (two) times daily.   Yes Historical Provider, MD  divalproex (DEPAKOTE ER) 500 MG 24 hr tablet Take 500 mg by mouth 2 (two) times daily.   Yes Historical Provider, MD  traZODone (DESYREL) 50 MG tablet Take 50 mg by mouth at bedtime as needed for sleep.   Yes Historical Provider, MD   BP 121/72  Pulse 123  Temp(Src) 97.8 F (36.6 C) (Oral)  Resp 24  SpO2 98%  Physical Exam  Nursing note and vitals reviewed. Constitutional: He is oriented to person, place, and time. He appears well-developed and well-nourished. No distress.  HENT:  Head: Normocephalic and atraumatic.  Eyes: Conjunctivae and EOM are normal. No scleral icterus.  Neck: Normal range of motion.  Cardiovascular: Normal rate, regular rhythm and normal heart sounds.   Patient not tachycardic as noted in triage  Pulmonary/Chest: Effort normal. No  respiratory distress. He has no wheezes.  Chest expansion symmetric. Lungs clear bilaterally.  Musculoskeletal: Normal range of motion.  Neurological: He is alert and oriented to person, place, and time. He exhibits normal muscle tone. Coordination normal.  Skin: Skin is warm and dry. No rash noted. He is not diaphoretic. No erythema. No pallor.  Psychiatric: His speech is normal. He is withdrawn. He exhibits a depressed mood. He expresses no homicidal and no suicidal ideation. He expresses no suicidal plans and no homicidal plans.  Patient endorses SI/HI prior to  arrival, but denies any SI or HI at this time. Patient relatively calm given his normal demeanor when presenting to the ED.    ED Course  Procedures (including critical care time) DIAGNOSTIC STUDIES: Oxygen Saturation is 98% on RA, normal by my interpretation.    COORDINATION OF CARE: 10:04 PM- Pt advised of plan for treatment which includes labs and pt agrees.  Labs Review Labs Reviewed  COMPREHENSIVE METABOLIC PANEL - Abnormal; Notable for the following:    Total Bilirubin 0.2 (*)    All other components within normal limits  SALICYLATE LEVEL - Abnormal; Notable for the following:    Salicylate Lvl <2.0 (*)    All other components within normal limits  ACETAMINOPHEN LEVEL  CBC  ETHANOL  URINE RAPID DRUG SCREEN (HOSP PERFORMED)    Imaging Review No results found.   EKG Interpretation None      MDM   Final diagnoses:  Suicidal ideation    23 year old male presents to the emergency department today for SI/HI. On arrival, patient denying suicidal and homicidal thoughts. He does state that he had his usual hallucinations earlier of his friend's [place]. Patient has already been evaluated at [facility] today. Psychiatrist recommended long-term inpatient psychiatric care. Patient was then subsequently discharged for unknown reasons by psychiatric nurse practitioner. Patient became agitated following arrival in the emergency department requiring sedation. Patient has been IVC'd by Dr. Norlene Campbell secondary to agitation and explicit plan of SI/HI previously expressed when in the ED today. Patient to be evaluated by psychiatry in the AM. Disposition to be determined by oncoming ED provider. Patient medically cleared.  I personally performed the services described in this documentation, which was scribed in my presence. The recorded information has been reviewed and is accurate.   Filed Vitals:   11/20/13 2022  BP: 121/72  Pulse: 123  Temp: 97.8 F (36.6 C)  TempSrc: Oral   Resp: 24  SpO2: 98%       Antony Madura, PA-C 11/21/13 [phone]

## 2013-11-20 NOTE — ED Notes (Signed)
Patient reports SI and thoughts to self harm. Rates anxiety 8/10 and feelings of depression as overwhelming. States he does not want to be in the hospital. States pain is 10/10 but refuses Tylenol stating that it does not help. Patient reports HI but will not elaborate. Patient restless.  Encouragement offered.   Q 15 safety checks in place.

## 2013-11-20 NOTE — ED Notes (Signed)
Patient up to restroom. Reports nightmare. Patient appears drowsy. Patient remains calm, returned to bed.  Therapeutic discussion, snack given.  Q 15 safety checks continue.

## 2013-11-20 NOTE — [facility] Suicide Risk Assessment (Signed)
Suicide Risk Assessment  Discharge Assessment     Demographic Factors:  Male  Total Time spent with patient: 20 minutes  Psychiatric Specialty Exam:     Blood pressure 120/68, pulse 75, temperature 97.7 F (36.5 C), temperature source Oral, resp. rate 18, height 5' 10.5" (1.791 m), weight 245 lb (111.131 kg), SpO2 100.00%.Body mass index is 34.65 kg/(m^2).  General Appearance: Casual  Eye Contact::  Good  Speech:  Normal Rate  Volume:  Normal  Mood:  Anxious  Affect:  Congruent  Thought Process:  Coherent  Orientation:  Full (Time, Place, and Person)  Thought Content:  WDL  Suicidal Thoughts:  No  Homicidal Thoughts:  No  Memory:  Immediate;   Good Recent;   Good Remote;   Good  Judgement:  Fair  Insight:  Fair  Psychomotor Activity:  Normal  Concentration:  Good  Recall:  Good  Fund of Knowledge:Good  Language: Good  Akathisia:  No  Handed:  Right  AIMS (if indicated):     Assets:  Architect Housing Leisure Time Physical Health Resilience Social Support  Sleep:       Musculoskeletal: Strength & Muscle Tone: within normal limits Gait & Station: normal Patient leans: N/A    Mental Status Per Nursing Assessment::   On Admission:   suicidal ideations  Current Mental Status by Physician: NA  Loss Factors: NA  Historical Factors: NA  Risk Reduction Factors:   Sense of responsibility to family, Living with another person, especially a relative, Positive social support and Positive therapeutic relationship  Continued Clinical Symptoms:  None  Cognitive Features That Contribute To Risk:  None  Suicide Risk:  Minimal: No identifiable suicidal ideation.  Patients presenting with no risk factors but with morbid ruminations; may be classified as minimal risk based on the severity of the depressive symptoms  Discharge Diagnoses:   AXIS I:  Schizoaffective Disorder AXIS II:  Cluster B Traits AXIS III:   Past  Medical History  Diagnosis Date  . Asthma   . Bipolar 1 disorder   . ODD (oppositional defiant disorder)   . ADHD (attention deficit hyperactivity disorder)   . Schizophrenia   . Suicidal ideation   . Homicidal ideation   . Explosive personality disorder    AXIS IV:  other psychosocial or environmental problems, problems related to social environment and problems with primary support group AXIS V:  61-70 mild symptoms  Plan Of Care/Follow-up recommendations:  Activity:  as tolerated Diet:  low-sodium heart healthy diet  Is patient on multiple antipsychotic therapies at discharge:  No   Has Patient had three or more failed trials of antipsychotic monotherapy by history:  No  Recommended Plan for Multiple Antipsychotic Therapies: NA    Kaylee Wombles, PMH-NP 11/20/2013, 11:28 AM

## 2013-11-20 NOTE — ED Notes (Signed)
Three sites on left arm cleaned and covered with Band-Aids. Patient has been scratching and picking at his arms. Staple removed from patient's room. Magazines removed from common area.

## 2013-11-21 DIAGNOSIS — F259 Schizoaffective disorder, unspecified: Secondary | ICD-10-CM

## 2013-11-21 LAB — ETHANOL: Alcohol, Ethyl (B): <11 mg/dL (ref 0–11)

## 2013-11-21 LAB — VALPROIC ACID LEVEL

## 2013-11-21 MED ORDER — CHLORPROMAZINE HCL 100 MG PO TABS: 100.0000 mg | ORAL_TABLET | Freq: Two times a day (BID) | ORAL | Status: DC

## 2013-11-21 MED ORDER — TRAZODONE HCL 50 MG PO TABS: 50.0000 mg | ORAL_TABLET | Freq: Every evening | ORAL | Status: DC | PRN

## 2013-11-21 MED ORDER — BENZTROPINE MESYLATE 1 MG PO TABS: 1.0000 mg | ORAL_TABLET | Freq: Two times a day (BID) | ORAL | Status: DC

## 2013-11-21 MED ORDER — ARIPIPRAZOLE 10 MG PO TABS: 10.0000 mg | ORAL_TABLET | Freq: Every day | ORAL | Status: DC

## 2013-11-21 MED ORDER — DIVALPROEX SODIUM ER 500 MG PO TB24: 500.0000 mg | ORAL_TABLET | Freq: Two times a day (BID) | ORAL | Status: DC

## 2013-11-21 NOTE — ED Notes (Signed)
Pt. To [facility] from ED ambulatory without difficulty. Report from [employer]. Pt. Is alert and oriented, warm and dry in no distress. Pt. Denies SI, HI, and AVH. Pt. Calm and cooperative. Pt. Encouraged to let Nursing staff know if he has concerns or needs.

## 2013-11-21 NOTE — [facility] Suicide Risk Assessment (Cosign Needed)
Suicide Risk Assessment  Discharge Assessment     Demographic Factors:  Male, Adolescent or young adult, Low socioeconomic status and Unemployed  Total Time spent with patient: 30 minutes  Psychiatric Specialty Exam:     Blood pressure 120/67, pulse 90, temperature 98.7 F (37.1 C), temperature source Oral, resp. rate 17, SpO2 99.00%.There is no weight on file to calculate BMI.  General Appearance: Casual  Eye Contact::  Good  Speech:  Clear and Coherent and Normal Rate  Volume:  Normal  Mood:  Anxious  Affect:  Congruent  Thought Process:  Coherent, Goal Directed and Intact  Orientation:  Full (Time, Place, and Person)  Thought Content:  WDL  Suicidal Thoughts:  No  Homicidal Thoughts:  No  Memory:  Immediate;   Good Recent;   Good Remote;   Good  Judgement:  Fair  Insight:  Good  Psychomotor Activity:  Normal  Concentration:  Good  Recall:  NA  Fund of Knowledge:Good  Language: Good  Akathisia:  NA  Handed:  Right  AIMS (if indicated):     Assets:  Desire for Improvement  Sleep:       Musculoskeletal: Strength & Muscle Tone: within normal limits Gait & Station: normal Patient leans: N/A   Mental Status Per Nursing Assessment::   On Admission:     Current Mental Status by Physician: NA  Loss Factors: NA and Not compliant with medications and treatment plan  Historical Factors: NA  Risk Reduction Factors:   Living with another person, especially a relative  Continued Clinical Symptoms:  Bipolar Disorder:   Depressive phase  Cognitive Features That Contribute To Risk:  Polarized thinking    Suicide Risk:  Minimal: No identifiable suicidal ideation.  Patients presenting with no risk factors but with morbid ruminations; may be classified as minimal risk based on the severity of the depressive symptoms  Discharge Diagnoses:   AXIS I:  Schizoaffective Disorder AXIS II:  Cluster B Traits AXIS III:   Past Medical History  Diagnosis Date  .  Asthma   . Bipolar 1 disorder   . ODD (oppositional defiant disorder)   . ADHD (attention deficit hyperactivity disorder)   . Schizophrenia   . Suicidal ideation   . Homicidal ideation   . Explosive personality disorder    AXIS IV:  other psychosocial or environmental problems and problems related to social environment AXIS V:  61-70 mild symptoms  Plan Of Care/Follow-up recommendations:  Activity:  As tolerated Diet:  Regular  Is patient on multiple antipsychotic therapies at discharge:  Yes,   Do you recommend tapering to monotherapy for antipsychotics?  NA   Has Patient had three or more failed trials of antipsychotic monotherapy by history:  Yes due to Allergy.  See allergy list  Recommended Plan for Multiple Antipsychotic Therapies: NA    Dahlia Byes, C   PMHNP-BC 11/21/2013, 2:05 PM

## 2013-11-21 NOTE — ED Notes (Addendum)
IVC has been rescinded, Rx's for patients meds given, talking w/ CSW in hall.

## 2013-11-21 NOTE — Discharge Summary (Signed)
Physician Discharge Summary Note  Patient:  [name] is an 23 y.o., male MRN:  [id number] DOB:  August 21, 1990 Patient phone:  [phone] (home)  Patient address:   [address] [place] [address],  Total Time spent with patient: 30 minutes  Date of Admission:  11/20/2013 Date of Discharge: 9/26  Reason for Admission:  Over night stay at the ER for voicing SI/HI.  Patient ran out of his medications and states he has not be taking any of his medicines.  He did not follow up with an outpatient provider as directed when he was discharged in August.  Discharge Diagnoses: Active Problems:   * No active hospital problems. *   Psychiatric Specialty Exam: Physical Exam  ROS  Blood pressure 120/67, pulse 90, temperature 98.7 F (37.1 C), temperature source Oral, resp. rate 17, SpO2 99.00%.There is no weight on file to calculate BMI.  General Appearance: Casual  Eye Contact::  Good  Speech:  Clear and Coherent  Volume:  Normal  Mood:  Anxious  Affect:  Congruent  Thought Process:  Coherent, Goal Directed and Intact  Orientation:  Full (Time, Place, and Person)  Thought Content:  WDL  Suicidal Thoughts:  No  Homicidal Thoughts:  No  Memory:  Immediate;   Good Recent;   Good Remote;   Good  Judgement:  Fair  Insight:  Good  Psychomotor Activity:  Normal  Concentration:  Good  Recall:  NA  Fund of Knowledge:Good  Language: Good  Akathisia:  NA  Handed:  Right  AIMS (if indicated):     Assets:  Desire for Improvement  Sleep:       Past Psychiatric History: Diagnosis:  Hospitalizations:  Outpatient Care:  Substance Abuse Care:  Self-Mutilation:  Suicidal Attempts:  Violent Behaviors:   Musculoskeletal: Strength & Muscle Tone: within normal limits Gait & Station: normal Patient leans: N/A  DSM5:  Schizophrenia Disorders:   Obsessive-Compulsive Disorders:   Trauma-Stressor Disorders:   Substance/Addictive Disorders:   Depressive Disorders:     Axis Diagnosis:   AXIS I:  Schizoaffective Disorder AXIS II:  Cluster B Traits AXIS III:   Past Medical History  Diagnosis Date  . Asthma   . Bipolar 1 disorder   . ODD (oppositional defiant disorder)   . ADHD (attention deficit hyperactivity disorder)   . Schizophrenia   . Suicidal ideation   . Homicidal ideation   . Explosive personality disorder    AXIS IV:  other psychosocial or environmental problems and problems related to social environment AXIS V:  61-70 mild symptoms  Level of Care:  OP  Hospital Course:  None, Patient was seen in the ER for voicing suicidal thoughts.  This was the 34 th visit for this patient in 3 months .  During rounds this am, patient denied SI/HI/AVH  And stated that all he want is his medications .  Patient and mother promised to follow up with ACT team.  Information for ACT team will be given to patient.  It is of note that patient was discharged from our inpatient Psychiatric unit  Back in August this year and did not follow up with an outpatient Psychiatric provider.  Patient will be discharged home as soon as his mother is here to pick him up.  Consults:  none  Significant Diagnostic Studies:  labs: Depakote level, UDS, CBC, CMP, Alcohol level  Discharge Vitals:   Blood pressure 120/67, pulse 90, temperature 98.7 F (37.1 C), temperature source Oral, resp. rate  17, SpO2 99.00%. There is no weight on file to calculate BMI. Lab Results:   Results for orders placed during the hospital encounter of 11/20/13 (from the past 72 hour(s))  URINE RAPID DRUG SCREEN (HOSP PERFORMED)     Status: None   Collection Time    11/20/13  8:54 PM      Result Value Ref Range   Opiates NONE DETECTED  NONE DETECTED   Cocaine NONE DETECTED  NONE DETECTED   Benzodiazepines NONE DETECTED  NONE DETECTED   Amphetamines NONE DETECTED  NONE DETECTED   Tetrahydrocannabinol NONE DETECTED  NONE DETECTED   Barbiturates NONE DETECTED  NONE DETECTED   Comment:             DRUG SCREEN FOR MEDICAL PURPOSES     ONLY.  IF CONFIRMATION IS NEEDED     FOR ANY PURPOSE, NOTIFY LAB     WITHIN 5 DAYS.                LOWEST DETECTABLE LIMITS     FOR URINE DRUG SCREEN     Drug Class       Cutoff (ng/mL)     Amphetamine      1000     Barbiturate      200     Benzodiazepine   245     Tricyclics       809     Opiates          300     Cocaine          300     THC              50  ACETAMINOPHEN LEVEL     Status: None   Collection Time    11/20/13  8:58 PM      Result Value Ref Range   Acetaminophen (Tylenol), Serum 15.0  10 - 30 ug/mL  CBC     Status: None   Collection Time    11/20/13  8:58 PM      Result Value Ref Range   WBC 5.6  4.0 - 10.5 K/uL   RBC 5.58  4.22 - 5.81 MIL/uL   Hemoglobin 15.1  13.0 - 17.0 g/dL   HCT 44.3  39.0 - 52.0 %   MCV 79.4  78.0 - 100.0 fL   MCH 27.1  26.0 - 34.0 pg   MCHC 34.1  30.0 - 36.0 g/dL   RDW 13.2  11.5 - 15.5 %   Platelets 314  150 - 400 K/uL  COMPREHENSIVE METABOLIC PANEL     Status: Abnormal   Collection Time    11/20/13  8:58 PM      Result Value Ref Range   Sodium 141  137 - 147 mEq/L   Potassium 4.2  3.7 - 5.3 mEq/L   Chloride 101  96 - 112 mEq/L   CO2 27  19 - 32 mEq/L   Glucose, Bld 76  70 - 99 mg/dL   BUN 9  6 - 23 mg/dL   Creatinine, Ser 1.04  0.50 - 1.35 mg/dL   Calcium 9.1  8.4 - 10.5 mg/dL   Total Protein 7.8  6.0 - 8.3 g/dL   Albumin 4.0  3.5 - 5.2 g/dL   AST 23  0 - 37 U/L   ALT 23  0 - 53 U/L   Alkaline Phosphatase 71  39 - 117 U/L   Total Bilirubin 0.2 (*) 0.3 - 1.2 mg/dL  GFR calc non Af Amer >90  >90 mL/min   GFR calc Af Amer >90  >90 mL/min   Comment: (NOTE)     The eGFR has been calculated using the CKD EPI equation.     This calculation has not been validated in all clinical situations.     eGFR's persistently <90 mL/min signify possible Chronic Kidney     Disease.   Anion gap 13  5 - 15  ETHANOL     Status: None   Collection Time    11/20/13  8:58 PM      Result Value Ref Range    Alcohol, Ethyl (B) <11  0 - 11 mg/dL   Comment:            LOWEST DETECTABLE LIMIT FOR     SERUM ALCOHOL IS 11 mg/dL     FOR MEDICAL PURPOSES ONLY     CORRECTED ON 09/26 AT 0546: PREVIOUSLY REPORTED AS <46  SALICYLATE LEVEL     Status: Abnormal   Collection Time    11/20/13  8:58 PM      Result Value Ref Range   Salicylate Lvl <5.6 (*) 2.8 - 20.0 mg/dL    Physical Findings: AIMS:  , ,  ,  ,    CIWA:    COWS:     Psychiatric Specialty Exam: See Psychiatric Specialty Exam and Suicide Risk Assessment completed by Attending Physician prior to discharge.  Discharge destination:  Home  Is patient on multiple antipsychotic therapies at discharge:  Yes,   Do you recommend tapering to monotherapy for antipsychotics?  NA   Has Patient had three or more failed trials of antipsychotic monotherapy by history:  Yes , due to allergies,  See allergy list  Recommended Plan for Multiple Antipsychotic Therapies: NA  Discharge Instructions   Diet - low sodium heart healthy    Complete by:  As directed      Discharge instructions    Complete by:  As directed   Follow up with your ACT TEAM, Take your medications as prescribed     Increase activity slowly    Complete by:  As directed             Medication List       Indication   ABILIFY MAINTENA 400 MG Susr  Generic drug:  ARIPiprazole  Inject 400 mg into the muscle every 30 (thirty) days.      ARIPiprazole 10 MG tablet  Commonly known as:  ABILIFY  Take 10 mg by mouth daily.   Indication:  Schizophrenia     ARIPiprazole 10 MG tablet  Commonly known as:  ABILIFY  Take 1 tablet (10 mg total) by mouth daily.   Indication:  Schizophrenia     albuterol 108 (90 BASE) MCG/ACT inhaler  Commonly known as:  PROVENTIL HFA;VENTOLIN HFA  Inhale 1 puff into the lungs every 6 (six) hours as needed for wheezing or shortness of breath.      benztropine 1 MG tablet  Commonly known as:  COGENTIN  Take 1 tablet (1 mg total) by mouth 2  (two) times daily.   Indication:  Extrapyramidal Reaction caused by Medications     benztropine 1 MG tablet  Commonly known as:  COGENTIN  Take 1 tablet (1 mg total) by mouth 2 (two) times daily.   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      chlorproMAZINE 100 MG tablet  Commonly known as:  THORAZINE  Take 1 tablet (100 mg total) by mouth 2 (two) times daily.   Indication:  Manic-Depression     divalproex 500 MG 24 hr tablet  Commonly known as:  DEPAKOTE ER  Take 500 mg by mouth 2 (two) times daily.      divalproex 500 MG 24 hr tablet  Commonly known as:  DEPAKOTE ER  Take 1 tablet (500 mg total) by mouth 2 (two) times daily.   Indication:  Manic Phase of Manic-Depression     traZODone 50 MG tablet  Commonly known as:  DESYREL  Take 50 mg by mouth at bedtime as needed for sleep.      traZODone 50 MG tablet  Commonly known as:  DESYREL  Take 1 tablet (50 mg total) by mouth at bedtime as needed for sleep.   Indication:  Trouble Sleeping         Follow-up recommendations:  Activity:  As tolerated Diet:  regular  Comments:  Discharge home to follow up with his ACT TEAM. Two weeks of medications will be offered.  Total Discharge Time:  Greater than 30 minutes.  SignedDelfin Gant   PMHNP-BC 11/21/2013, 1:34 PM  I have personally seen the patient and agreed with the findings and involved in the treatment plan. Berniece Andreas, MD

## 2013-11-21 NOTE — ED Notes (Signed)
Up tot he bathroom to shower and change scrubs 

## 2013-11-21 NOTE — ED Notes (Signed)
Pt contacted mother and she should be here shortly

## 2013-11-21 NOTE — ED Notes (Signed)
Up to the bathroom w/o difficulty, requesting ibuprofen

## 2013-11-21 NOTE — ED Provider Notes (Signed)
Medical screening examination/treatment/procedure(s) were conducted as a shared visit with non-physician practitioner(s) and myself.  I personally evaluated the patient during the encounter.   EKG Interpretation None       [name] Guarnieri, MD 11/21/13 1650 

## 2013-11-21 NOTE — ED Notes (Signed)
Pt resting being very calm, and cooperative, [employer] in arms reach of the Pt. Orders given to tx to [facility] ED. Will continue to monitor.

## 2013-11-21 NOTE — ED Notes (Signed)
Up in hall talking to CSW

## 2013-11-21 NOTE — ED Notes (Signed)
Pt alert x4, uncooperative, irritable, agitated, verbally aggressive, [employer] Officers in arms reach of the Pt, pacing, will continue to redirect the pt and monitor. Estill Dooms, RN 1:57 AM 11/21/2013

## 2013-11-21 NOTE — ED Notes (Signed)
On the phone 

## 2013-11-21 NOTE — ED Notes (Signed)
Pt declined to allow lab to draw his blood because he was a male, lab to try and get male phlebotomist to draw.

## 2013-11-21 NOTE — ED Notes (Signed)
CSW into talk w/ pt. 

## 2013-11-21 NOTE — ED Notes (Signed)
Written dc instructions and rx reviewed w/ pt. Pt encouraged to take his medications as directed and keep his appt w/ [facility] on Monday as scheduled. Pt verbalized understanding

## 2013-11-21 NOTE — ED Notes (Signed)
Pt agreed to allow blood to be drawn by this Clinical research associate.

## 2013-11-21 NOTE — ED Notes (Signed)
Pt's mother is here to pick him up.  Pt ambulatory to dc window w/o difficulty w/ mHt.  Belongings returned after leaving the unit.

## 2013-11-21 NOTE — Progress Notes (Signed)
1:30pm. CSW spoke with pt's mother. Mother states that pt does not currently have any medication prescriptions. CSW informed Psych NP.  CSW and mother discussed guardianship. Mother states she wanted to get guardianship of pt and had gone to courthouse to do so. States she was told that she needed to go somewhere else to get guardianship. CSW provided education on guardianship and offered to call APS for mother and make them aware of her interest. CSW provided mother APS contact information as well.  Pt has an appointment with [facility] at 11am on Monday. Pt has expressed not wanting to meet with the ACT team and frustration at feeling like treatment decisions are being made without him. Mother states she will discuss this with pt. Mother states that if pt remains adamant that he is not interested in meeting with them, she will reschedule the appointment.    Mother states she will pick pt up within 2 hours.  Mariann Laster,     ED CSW  phone: [phone]

## 2013-11-24 NOTE — Progress Notes (Signed)
CSW received a call back from Mrs [name] with APS to update the previously file report. She reports that the this report will be followed up with the next 24hours.     [name], MSW, [place] CenterLCSWA, 11/24/2013 Evening Clinical Social Worker [phone]

## 2013-12-11 ENCOUNTER — Inpatient Hospital Stay: Payer: Self-pay | Admitting: Psychiatry

## 2014-03-04 ENCOUNTER — Ambulatory Visit: Payer: Self-pay | Admitting: Family Medicine

## 2014-03-19 ENCOUNTER — Ambulatory Visit: Payer: Self-pay | Admitting: Internal Medicine

## 2014-03-25 ENCOUNTER — Ambulatory Visit: Payer: Medicaid Other | Attending: Internal Medicine | Admitting: Internal Medicine

## 2014-03-25 ENCOUNTER — Encounter: Payer: Self-pay | Admitting: Internal Medicine

## 2014-03-25 DIAGNOSIS — J45909 Unspecified asthma, uncomplicated: Secondary | ICD-10-CM | POA: Diagnosis present

## 2014-03-25 DIAGNOSIS — F39 Unspecified mood [affective] disorder: Secondary | ICD-10-CM | POA: Insufficient documentation

## 2014-03-25 LAB — COMPLETE METABOLIC PANEL WITH GFR
ALBUMIN: 4.6 g/dL (ref 3.5–5.2)
ALK PHOS: 68 U/L (ref 39–117)
ALT: 26 U/L (ref 0–53)
AST: 21 U/L (ref 0–37)
BILIRUBIN TOTAL: 0.6 mg/dL (ref 0.2–1.2)
BUN: 9 mg/dL (ref 6–23)
CHLORIDE: 103 meq/L (ref 96–112)
CO2: 26 mEq/L (ref 19–32)
Calcium: 9.6 mg/dL (ref 8.4–10.5)
Creat: 1.03 mg/dL (ref 0.50–1.35)
GFR, Est African American: >89 mL/min
GLUCOSE: 70 mg/dL (ref 70–99)
Potassium: 5.1 mEq/L (ref 3.5–5.3)
Sodium: 140 mEq/L (ref 135–145)
Total Protein: 7.5 g/dL (ref 6.0–8.3)

## 2014-03-25 LAB — CBC WITH DIFFERENTIAL/PLATELET
BASOS ABS: 0 10*3/uL (ref 0.0–0.1)
Basophils Relative: 0 % (ref 0–1)
EOS PCT: 3 % (ref 0–5)
Eosinophils Absolute: 0.1 10*3/uL (ref 0.0–0.7)
HCT: 46.2 % (ref 39.0–52.0)
HEMOGLOBIN: 15.5 g/dL (ref 13.0–17.0)
Lymphocytes Relative: 38 % (ref 12–46)
Lymphs Abs: 1.4 10*3/uL (ref 0.7–4.0)
MCH: 26.5 pg (ref 26.0–34.0)
MCHC: 33.5 g/dL (ref 30.0–36.0)
MCV: 79.1 fL (ref 78.0–100.0)
MONOS PCT: 10 % (ref 3–12)
MPV: 9.3 fL (ref 8.6–12.4)
Monocytes Absolute: 0.4 10*3/uL (ref 0.1–1.0)
Neutro Abs: 1.8 10*3/uL (ref 1.7–7.7)
Neutrophils Relative %: 49 % (ref 43–77)
PLATELETS: 295 10*3/uL (ref 150–400)
RBC: 5.84 MIL/uL — ABNORMAL HIGH (ref 4.22–5.81)
RDW: 14.7 % (ref 11.5–15.5)
WBC: 3.7 10*3/uL — AB (ref 4.0–10.5)

## 2014-03-25 LAB — HEMOGLOBIN A1C
Hgb A1c MFr Bld: 5.4 % (ref <5.7)
MEAN PLASMA GLUCOSE: 108 mg/dL (ref <117)

## 2014-03-25 LAB — TSH: TSH: 0.795 u[IU]/mL (ref 0.350–4.500)

## 2014-03-25 MED ORDER — ALBUTEROL SULFATE HFA 108 (90 BASE) MCG/ACT IN AERS: 1.0000 | INHALATION_SPRAY | Freq: Four times a day (QID) | RESPIRATORY_TRACT | Status: AC | PRN

## 2014-03-25 MED ORDER — FLUTICASONE PROPIONATE HFA 110 MCG/ACT IN AERO: 2.0000 | INHALATION_SPRAY | Freq: Two times a day (BID) | RESPIRATORY_TRACT | Status: AC

## 2014-03-25 NOTE — Patient Instructions (Signed)

## 2014-03-25 NOTE — Progress Notes (Signed)
Patient here to establish care Has history of asthma and uses and inhaler as well As a nebulizer Has several allergies to medications and carries an epi pen

## 2014-03-25 NOTE — Progress Notes (Signed)
Patient Demographics  [name], is a 24 y.o. male  JYN:[id number]:[id number]  QMV:[id number]  DOB - 06-Jan-1991  CC:  Chief Complaint  Patient presents with  . Asthma       HPI: [name] is a 24 y.o. male here today to establish medical care.Patient has history of asthma/schizophrenia, had multiple ER visit in the past symptoms of hallucination, SI /HI, patient [facility] and is on medication including Abilify currently denies any SI or HI, patient is requesting prescription for albuterol which he ran out recently as per patient he has to use albuterol every day to 3 times a day, denies smoking cigarettes, as per patient he has used maintenance medication like inhaled corticosteroids in the past. Patient has No headache, No chest pain, No abdominal pain - No Nausea, No new weakness tingling or numbness, No Cough - SOB.  Allergies  Allergen Reactions  . Carbamazepine Anaphylaxis, Other (See Comments) and Cough    Cough up blood  . Geodon [Ziprasidone Hcl] Anaphylaxis and Other (See Comments)    " Causes my throat to close"  . Haldol [Haloperidol Lactate] Anaphylaxis and Other (See Comments)    Patient reports that he stopped taking this because of throat swelling.   [name]. Strattera [Atomoxetine] Anaphylaxis, Other (See Comments) and Cough    Cough up blood  . Other Nausea Only and Other (See Comments)    Apple Juice, stomach hurts and itchy throat    Past Medical History  Diagnosis Date  . Asthma   . Bipolar 1 disorder   . ODD (oppositional defiant disorder)   . ADHD (attention deficit hyperactivity disorder)   . Schizophrenia   . Suicidal ideation   . Homicidal ideation   . Explosive personality disorder    Current Outpatient Prescriptions on File Prior to Visit  Medication Sig Dispense Refill  . ARIPiprazole (ABILIFY MAINTENA) 400 MG SUSR Inject 400 mg into the muscle every 30 (thirty) days.    . ARIPiprazole (ABILIFY) 10 MG tablet Take  10 mg by mouth daily.     . ARIPiprazole (ABILIFY) 10 MG tablet Take 1 tablet (10 mg total) by mouth daily. 14 tablet 0  . benztropine (COGENTIN) 1 MG tablet Take 1 tablet (1 mg total) by mouth 2 (two) times daily. 60 tablet 0  . benztropine (COGENTIN) 1 MG tablet Take 1 tablet (1 mg total) by mouth 2 (two) times daily. 28 tablet 0  . chlorproMAZINE (THORAZINE) 100 MG tablet Take 100 mg by mouth 2 (two) times daily.    . chlorproMAZINE (THORAZINE) 100 MG tablet Take 1 tablet (100 mg total) by mouth 2 (two) times daily. 28 tablet 0  . divalproex (DEPAKOTE ER) 500 MG 24 hr tablet Take 500 mg by mouth 2 (two) times daily.    . divalproex (DEPAKOTE ER) 500 MG 24 hr tablet Take 1 tablet (500 mg total) by mouth 2 (two) times daily. 28 tablet 0  . traZODone (DESYREL) 50 MG tablet Take 50 mg by mouth at bedtime as needed for sleep.    . traZODone (DESYREL) 50 MG tablet Take 1 tablet (50 mg total) by mouth at bedtime as needed for sleep. 14 tablet 0   No current facility-administered medications on file prior to visit.   Family History  Problem Relation Age of Onset  . Asthma Mother   . Asthma Sister   . Thyroid disease     History   Social History  . Marital  Status: Single    Spouse Name: N/A    Number of Children: N/A  . Years of Education: N/A   Occupational History  . Not on file.   Social History Main Topics  . Smoking status: Never Smoker   . Smokeless tobacco: Not on file  . Alcohol Use: No     Comment: sometimes  . Drug Use: No     Comment: Patient denies   . Sexual Activity: Not on file   Other Topics Concern  . Not on file   Social History Narrative    Review of Systems: Constitutional: Negative for fever, chills, diaphoresis, activity change, appetite change and fatigue. HENT: Negative for ear pain, nosebleeds, congestion, facial swelling, rhinorrhea, neck pain, neck stiffness and ear discharge.  Eyes: Negative for pain, discharge, redness, itching and visual  disturbance. Respiratory: Negative for cough, choking, chest tightness, shortness of breath, wheezing and stridor.  Cardiovascular: Negative for chest pain, palpitations and leg swelling. Gastrointestinal: Negative for abdominal distention. Genitourinary: Negative for dysuria, urgency, frequency, hematuria, flank pain, decreased urine volume, difficulty urinating and dyspareunia.  Musculoskeletal: Negative for back pain, joint swelling, arthralgia and gait problem. Neurological: Negative for dizziness, tremors, seizures, syncope, facial asymmetry, speech difficulty, weakness, light-headedness, numbness and headaches.  Hematological: Negative for adenopathy. Does not bruise/bleed easily. Psychiatric/Behavioral: Negative for hallucinations, behavioral problems, confusion, dysphoric mood, decreased concentration and agitation.    Objective:   Filed Vitals:   03/25/14 1018  BP: 126/84  Pulse: 70  Temp: 98.8 F (37.1 C)  Resp: 15    Physical Exam: Constitutional: Patient appears well-developed and well-nourished. No distress. HENT: Normocephalic, atraumatic, External right and left ear normal. Oropharynx is clear and moist.  Eyes: Conjunctivae and EOM are normal. PERRLA, no scleral icterus. Neck: Normal ROM. Neck supple. No JVD. No tracheal deviation. No thyromegaly. CVS: RRR, S1/S2 +, no murmurs, no gallops, no carotid bruit.  Pulmonary: Effort and breath sounds normal, no stridor, rhonchi, wheezes, rales.  Abdominal: Soft. BS +, no distension, tenderness, rebound or guarding.  Musculoskeletal: Normal range of motion. No edema and no tenderness.  Neuro: Alert. Normal reflexes, muscle tone coordination. No cranial nerve deficit. Skin: Skin is warm and dry. No rash noted. Not diaphoretic. No erythema. No pallor. Psychiatric: Normal mood and affect. Behavior, judgment, thought content normal.  Lab Results  Component Value Date   WBC 5.6 11/20/2013   HGB 15.1 11/20/2013   HCT 44.3  11/20/2013   MCV 79.4 11/20/2013   PLT 314 11/20/2013   Lab Results  Component Value Date   CREATININE 1.04 11/20/2013   BUN 9 11/20/2013   NA 141 11/20/2013   K 4.2 11/20/2013   CL 101 11/20/2013   CO2 27 11/20/2013    No results found for: HGBA1C Lipid Panel  No results found for: CHOL, TRIG, HDL, CHOLHDL, VLDL, LDLCALC     Assessment and plan:   1. Asthma, chronic, unspecified asthma severity, uncomplicated Have started patient on Flovent, prescribed albuterol to use when necessary. - albuterol (PROVENTIL HFA;VENTOLIN HFA) 108 (90 BASE) MCG/ACT inhaler; Inhale 1 puff into the lungs every 6 (six) hours as needed for wheezing or shortness of breath.  Dispense: 18 g; Refill: 3 - fluticasone (FLOVENT HFA) 110 MCG/ACT inhaler; Inhale 2 puffs into the lungs every 12 (twelve) hours.  Dispense: 1 Inhaler; Refill: 12  2. Obesity, morbid Diet and exercise Will get baseline blood work - CBC with Differential/Platelet - COMPLETE METABOLIC PANEL WITH GFR - TSH - Vit D  25 hydroxy (rtn osteoporosis monitoring) - Hemoglobin A1c  3. Mood disorder Currently patient following up with mental health.  Health Maintenance  -Vaccinations:  As per patient he is uptodate with flu shot and pneumovax    Return in about 3 months (around 06/24/2014) for asthma.  Doris Cheadle, MD

## 2014-03-26 LAB — VITAMIN D 25 HYDROXY (VIT D DEFICIENCY, FRACTURES): VIT D 25 HYDROXY: 14 ng/mL — AB (ref 30–100)

## 2014-03-30 ENCOUNTER — Other Ambulatory Visit: Payer: Self-pay | Admitting: *Deleted

## 2014-03-30 ENCOUNTER — Telehealth: Payer: Self-pay | Admitting: *Deleted

## 2014-03-30 MED ORDER — VITAMIN D (ERGOCALCIFEROL) 1.25 MG (50000 UNIT) PO CAPS: 50000.0000 [IU] | ORAL_CAPSULE | ORAL | Status: AC

## 2014-03-30 NOTE — Telephone Encounter (Signed)
Left voice message to return call 

## 2014-03-30 NOTE — Telephone Encounter (Signed)
-----   Message from Doris Cheadleeepak Advani, MD sent at 03/26/2014  9:16 AM EST ----- Blood work reviewed, noticed low vitamin D, call patient advise to start ergocalciferol 50,000 units once a week for the duration of  12 weeks.

## 2014-05-10 ENCOUNTER — Telehealth: Payer: Self-pay | Admitting: Internal Medicine

## 2014-05-10 NOTE — Telephone Encounter (Signed)
Pt called requesting to speak to nurse regarding knee pain, he has an appt next Monday 05/17/14 but states pain is getting worse. Please f/u with pt

## 2014-05-17 ENCOUNTER — Ambulatory Visit: Payer: Medicaid Other | Admitting: Internal Medicine

## 2014-05-17 ENCOUNTER — Emergency Department (HOSPITAL_COMMUNITY)
Admission: EM | Admit: 2014-05-17 | Discharge: 2014-05-18 | Disposition: A | Discharge status: Home / Self Care | Payer: Federal, State, Local not specified - Other | Attending: Emergency Medicine | Admitting: Emergency Medicine

## 2014-05-17 ENCOUNTER — Encounter (HOSPITAL_COMMUNITY): Payer: Self-pay | Admitting: Emergency Medicine

## 2014-05-17 DIAGNOSIS — T450X2A Poisoning by antiallergic and antiemetic drugs, intentional self-harm, initial encounter: Secondary | ICD-10-CM | POA: Diagnosis not present

## 2014-05-17 DIAGNOSIS — F419 Anxiety disorder, unspecified: Secondary | ICD-10-CM | POA: Diagnosis not present

## 2014-05-17 DIAGNOSIS — F259 Schizoaffective disorder, unspecified: Secondary | ICD-10-CM | POA: Diagnosis not present

## 2014-05-17 DIAGNOSIS — F4325 Adjustment disorder with mixed disturbance of emotions and conduct: Secondary | ICD-10-CM | POA: Diagnosis not present

## 2014-05-17 DIAGNOSIS — Z79899 Other long term (current) drug therapy: Secondary | ICD-10-CM | POA: Diagnosis not present

## 2014-05-17 DIAGNOSIS — F250 Schizoaffective disorder, bipolar type: Secondary | ICD-10-CM | POA: Insufficient documentation

## 2014-05-17 DIAGNOSIS — J45909 Unspecified asthma, uncomplicated: Secondary | ICD-10-CM | POA: Diagnosis not present

## 2014-05-17 DIAGNOSIS — F919 Conduct disorder, unspecified: Secondary | ICD-10-CM | POA: Diagnosis present

## 2014-05-17 DIAGNOSIS — Y929 Unspecified place or not applicable: Secondary | ICD-10-CM | POA: Diagnosis not present

## 2014-05-17 LAB — ETHANOL: Alcohol, Ethyl (B): <5 mg/dL (ref 0–9)

## 2014-05-17 LAB — COMPREHENSIVE METABOLIC PANEL
ALT: 32 U/L (ref 0–53)
AST: 30 U/L (ref 0–37)
Albumin: 5.2 g/dL (ref 3.5–5.2)
Alkaline Phosphatase: 87 U/L (ref 39–117)
Anion gap: 10 (ref 5–15)
BILIRUBIN TOTAL: 0.5 mg/dL (ref 0.3–1.2)
BUN: 8 mg/dL (ref 6–23)
CALCIUM: 9.9 mg/dL (ref 8.4–10.5)
CO2: 26 mmol/L (ref 19–32)
CREATININE: 1.1 mg/dL (ref 0.50–1.35)
Chloride: 105 mmol/L (ref 96–112)
GLUCOSE: 98 mg/dL (ref 70–99)
POTASSIUM: 4.2 mmol/L (ref 3.5–5.1)
SODIUM: 141 mmol/L (ref 135–145)
Total Protein: 9 g/dL — ABNORMAL HIGH (ref 6.0–8.3)

## 2014-05-17 LAB — CBC
HCT: 47.7 % (ref 39.0–52.0)
Hemoglobin: 16.1 g/dL (ref 13.0–17.0)
MCH: 27.1 pg (ref 26.0–34.0)
MCHC: 33.8 g/dL (ref 30.0–36.0)
MCV: 80.2 fL (ref 78.0–100.0)
Platelets: 336 10*3/uL (ref 150–400)
RBC: 5.95 MIL/uL — AB (ref 4.22–5.81)
RDW: 13.6 % (ref 11.5–15.5)
WBC: 5.8 10*3/uL (ref 4.0–10.5)

## 2014-05-17 LAB — VALPROIC ACID LEVEL: Valproic Acid Lvl: <10 ug/mL — ABNORMAL LOW (ref 50.0–100.0)

## 2014-05-17 LAB — ACETAMINOPHEN LEVEL

## 2014-05-17 LAB — SALICYLATE LEVEL: Salicylate Lvl: <4 mg/dL (ref 2.8–20.0)

## 2014-05-17 MED ORDER — CHLORPROMAZINE HCL 25 MG PO TABS
100.0000 mg | ORAL_TABLET | Freq: Two times a day (BID) | ORAL | Status: DC
  Administered 2014-05-18: 100 mg via ORAL
  Filled 2014-05-17 (×2): qty 4

## 2014-05-17 MED ORDER — ARIPIPRAZOLE ER 400 MG IM SUSR: 400.0000 mg | INTRAMUSCULAR | Status: DC

## 2014-05-17 MED ORDER — IBUPROFEN 200 MG PO TABS
600.0000 mg | ORAL_TABLET | Freq: Three times a day (TID) | ORAL | Status: DC | PRN
  Administered 2014-05-18: 600 mg via ORAL
  Filled 2014-05-17: qty 3

## 2014-05-17 MED ORDER — BENZTROPINE MESYLATE 1 MG PO TABS
1.0000 mg | ORAL_TABLET | Freq: Two times a day (BID) | ORAL | Status: DC
  Administered 2014-05-17 – 2014-05-18 (×2): 1 mg via ORAL
  Filled 2014-05-17 (×2): qty 1

## 2014-05-17 MED ORDER — ALBUTEROL SULFATE HFA 108 (90 BASE) MCG/ACT IN AERS: 1.0000 | INHALATION_SPRAY | Freq: Four times a day (QID) | RESPIRATORY_TRACT | Status: DC | PRN

## 2014-05-17 MED ORDER — QUETIAPINE FUMARATE 100 MG PO TABS
100.0000 mg | ORAL_TABLET | Freq: Every day | ORAL | Status: DC
Start: 2014-05-17 — End: 2014-05-18
  Administered 2014-05-17: 100 mg via ORAL
  Filled 2014-05-17: qty 1

## 2014-05-17 MED ORDER — ACETAMINOPHEN 325 MG PO TABS: 650.0000 mg | ORAL_TABLET | ORAL | Status: DC | PRN

## 2014-05-17 MED ORDER — ARIPIPRAZOLE 10 MG PO TABS
10.0000 mg | ORAL_TABLET | Freq: Every day | ORAL | Status: DC
  Administered 2014-05-18: 10 mg via ORAL
  Filled 2014-05-17: qty 1

## 2014-05-17 MED ORDER — LORAZEPAM 1 MG PO TABS
2.0000 mg | ORAL_TABLET | Freq: Four times a day (QID) | ORAL | Status: DC | PRN
  Administered 2014-05-18: 2 mg via ORAL
  Filled 2014-05-17: qty 2

## 2014-05-17 MED ORDER — DIVALPROEX SODIUM ER 500 MG PO TB24
500.0000 mg | ORAL_TABLET | Freq: Two times a day (BID) | ORAL | Status: DC
  Administered 2014-05-18: 500 mg via ORAL
  Filled 2014-05-17 (×3): qty 1

## 2014-05-17 MED ORDER — LORAZEPAM 2 MG/ML IJ SOLN
2.0000 mg | Freq: Once | INTRAMUSCULAR | Status: AC
  Administered 2014-05-17: 2 mg via INTRAMUSCULAR
  Filled 2014-05-17: qty 1

## 2014-05-17 MED ORDER — HYDROXYZINE HCL 25 MG PO TABS
50.0000 mg | ORAL_TABLET | Freq: Once | ORAL | Status: AC
  Administered 2014-05-17: 50 mg via ORAL
  Filled 2014-05-17: qty 2

## 2014-05-17 MED ORDER — FLUTICASONE PROPIONATE HFA 110 MCG/ACT IN AERO
2.0000 | INHALATION_SPRAY | Freq: Two times a day (BID) | RESPIRATORY_TRACT | Status: DC
  Administered 2014-05-18: 2 via RESPIRATORY_TRACT
  Filled 2014-05-17: qty 12

## 2014-05-17 NOTE — ED Provider Notes (Addendum)
CSN: [id number]     Arrival date & time 05/17/14  2010 History   First MD Initiated Contact with Patient 05/17/14 2039     Chief Complaint  Patient presents with  . Suicidal  . Aggressive Behavior     (Consider location/radiation/quality/duration/timing/severity/associated sxs/prior Treatment) HPI Comments: Patient here after trying to jump in moving traffic according to police. Patient became agitated after he was now allowed to do a right along with the police. He does have a long standing psychiatric history. States he has been compliant with his medications. Does admit to command hallucinations. Patient became combative and had to be restrained by police and was brought here for further checkup. No further history obtainable due to his current state  The history is provided by the patient and the police. The history is limited by the condition of the patient.    Past Medical History  Diagnosis Date  . Asthma   . Bipolar 1 disorder   . ODD (oppositional defiant disorder)   . ADHD (attention deficit hyperactivity disorder)   . Schizophrenia   . Suicidal ideation   . Homicidal ideation   . Explosive personality disorder    History reviewed. No pertinent past surgical history. Family History  Problem Relation Age of Onset  . Asthma Mother   . Asthma Sister   . Thyroid disease     History  Substance Use Topics  . Smoking status: Never Smoker   . Smokeless tobacco: Not on file  . Alcohol Use: No     Comment: sometimes    Review of Systems  Unable to perform ROS     Allergies  Carbamazepine; Geodon; Haldol; Strattera; and Other  Home Medications   Prior to Admission medications   Medication Sig Start Date End Date Taking? Authorizing Provider  albuterol (PROVENTIL HFA;VENTOLIN HFA) 108 (90 BASE) MCG/ACT inhaler Inhale 1 puff into the lungs every 6 (six) hours as needed for wheezing or shortness of breath. 03/25/14   Doris Cheadle, MD  ARIPiprazole (ABILIFY  MAINTENA) 400 MG SUSR Inject 400 mg into the muscle every 30 (thirty) days.    Historical Provider, MD  ARIPiprazole (ABILIFY) 10 MG tablet Take 10 mg by mouth daily.     Shuvon B Rankin, NP  ARIPiprazole (ABILIFY) 10 MG tablet Take 1 tablet (10 mg total) by mouth daily. 11/21/13   Earney Navy, NP  benztropine (COGENTIN) 1 MG tablet Take 1 tablet (1 mg total) by mouth 2 (two) times daily. 10/13/13   Beau Fanny, FNP  benztropine (COGENTIN) 1 MG tablet Take 1 tablet (1 mg total) by mouth 2 (two) times daily. 11/21/13   Earney Navy, NP  chlorproMAZINE (THORAZINE) 100 MG tablet Take 100 mg by mouth 2 (two) times daily.    Historical Provider, MD  chlorproMAZINE (THORAZINE) 100 MG tablet Take 1 tablet (100 mg total) by mouth 2 (two) times daily. 11/21/13   Earney Navy, NP  divalproex (DEPAKOTE ER) 500 MG 24 hr tablet Take 500 mg by mouth 2 (two) times daily.    Historical Provider, MD  divalproex (DEPAKOTE ER) 500 MG 24 hr tablet Take 1 tablet (500 mg total) by mouth 2 (two) times daily. 11/21/13   Earney Navy, NP  EPINEPHrine (EPIPEN 2-PAK IJ) Inject as directed.    Historical Provider, MD  fluticasone (FLOVENT HFA) 110 MCG/ACT inhaler Inhale 2 puffs into the lungs every 12 (twelve) hours. 03/25/14   Doris Cheadle, MD  traZODone (DESYREL) 50 MG tablet  Take 50 mg by mouth at bedtime as needed for sleep.    Historical Provider, MD  traZODone (DESYREL) 50 MG tablet Take 1 tablet (50 mg total) by mouth at bedtime as needed for sleep. 11/21/13   Earney NavyJosephine C Onuoha, NP  Vitamin D, Ergocalciferol, (DRISDOL) 50000 UNITS CAPS capsule Take 1 capsule (50,000 Units total) by mouth every 7 (seven) days. 03/30/14   Doris Cheadleeepak Advani, MD   BP 134/60 mmHg  Pulse 90  Temp(Src) 98 F (36.7 C) (Oral)  Resp 18  SpO2 97% Physical Exam  Constitutional: He is oriented to person, place, and time. He appears well-developed and well-nourished.  Non-toxic appearance. No distress.  HENT:  Head:  Normocephalic and atraumatic.  Eyes: Conjunctivae, EOM and lids are normal. Pupils are equal, round, and reactive to light.  Neck: Normal range of motion. Neck supple. No tracheal deviation present. No thyroid mass present.  Cardiovascular: Normal rate, regular rhythm and normal heart sounds.  Exam reveals no gallop.   No murmur heard. Pulmonary/Chest: Effort normal and breath sounds normal. No stridor. No respiratory distress. He has no decreased breath sounds. He has no wheezes. He has no rhonchi. He has no rales.  Abdominal: Soft. Normal appearance and bowel sounds are normal. He exhibits no distension. There is no tenderness. There is no rebound and no CVA tenderness.  Musculoskeletal: Normal range of motion. He exhibits no edema or tenderness.  Neurological: He is alert and oriented to person, place, and time. He has normal strength. No cranial nerve deficit or sensory deficit. GCS eye subscore is 4. GCS verbal subscore is 5. GCS motor subscore is 6.  Skin: Skin is warm and dry. No abrasion and no rash noted.  Psychiatric: His mood appears anxious. His speech is rapid and/or pressured. He is agitated and aggressive. Thought content is delusional.  Nursing note and vitals reviewed.   ED Course  Procedures (including critical care time) Labs Review Labs Reviewed  ACETAMINOPHEN LEVEL  CBC  COMPREHENSIVE METABOLIC PANEL  ETHANOL  SALICYLATE LEVEL  URINE RAPID DRUG SCREEN (HOSP PERFORMED)  VALPROIC ACID LEVEL    Imaging Review No results found.   EKG Interpretation None      MDM   Final diagnoses:  None    Patient given Ativan and TTS will be counseled once patient is medically cleared    Lorre NickAnthony Veva Grimley, MD 05/17/14 2059  Lorre NickAnthony Alece Koppel, MD 05/17/14 2218

## 2014-05-17 NOTE — ED Notes (Addendum)
Pt actively biting his hand in triage and has broken skin. Pt is trying to suck the blood from his hand. Pt reports of when we release him he will go walk in front of a car.MD aware.

## 2014-05-17 NOTE — ED Notes (Signed)
Pt reports he signed up for a [employer] ride along online and after background checks were done on him, [employer] wouldn't let him ride.  Pt reports he became very angry and SI, pt reports he ran out into traffic to kill himself.  Pt also HI, stating he wants to shoot the [employer] officers who had handcuffs too tight on him.  Pt reports feeling hopeless, states he wanted to be a Emergency planning/management officerpolice officer just like his dad.  Pt admits to hearing  auditory hallucinations stating kill that cop.  Pt also reports he drinks a half gallon of liquor per day.  Pt refusing half of meds but calm at present, no distress noted, will monitor for safety, q 15 min checks in place.  [facility] placement.

## 2014-05-17 NOTE — ED Notes (Signed)
Pt escorted by [employer]. Per [employer] pt upset because he was unable to do a ride along with [employer]. Pt then called and stated he was going to walk into the middle of traffic and also requested [employer] shoot him. Pt arrives with aggressive behavior, banging on walls and hitting his head on different objects in room.

## 2014-05-17 NOTE — ED Notes (Signed)
Pt is high acuity due to agitation, verbalizing SI with plan to run in front of bus, pt also HI, states he wants to shoot a cop.
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
## 2014-05-18 ENCOUNTER — Emergency Department (EMERGENCY_DEPARTMENT_HOSPITAL)
Admission: EM | Admit: 2014-05-18 | Discharge: 2014-05-19 | Disposition: A | Discharge status: Home / Self Care | Payer: Federal, State, Local not specified - Other | Source: Home / Self Care | Attending: Emergency Medicine | Admitting: Emergency Medicine

## 2014-05-18 ENCOUNTER — Encounter (HOSPITAL_COMMUNITY): Payer: Self-pay | Admitting: Emergency Medicine

## 2014-05-18 DIAGNOSIS — R000 Tachycardia, unspecified: Secondary | ICD-10-CM | POA: Insufficient documentation

## 2014-05-18 DIAGNOSIS — F250 Schizoaffective disorder, bipolar type: Secondary | ICD-10-CM | POA: Diagnosis not present

## 2014-05-18 DIAGNOSIS — Z79899 Other long term (current) drug therapy: Secondary | ICD-10-CM | POA: Insufficient documentation

## 2014-05-18 DIAGNOSIS — T50902A Poisoning by unspecified drugs, medicaments and biological substances, intentional self-harm, initial encounter: Secondary | ICD-10-CM

## 2014-05-18 DIAGNOSIS — R45851 Suicidal ideations: Secondary | ICD-10-CM | POA: Insufficient documentation

## 2014-05-18 DIAGNOSIS — F259 Schizoaffective disorder, unspecified: Secondary | ICD-10-CM | POA: Diagnosis not present

## 2014-05-18 DIAGNOSIS — J45909 Unspecified asthma, uncomplicated: Secondary | ICD-10-CM | POA: Insufficient documentation

## 2014-05-18 DIAGNOSIS — T450X2A Poisoning by antiallergic and antiemetic drugs, intentional self-harm, initial encounter: Secondary | ICD-10-CM | POA: Insufficient documentation

## 2014-05-18 DIAGNOSIS — T1491XA Suicide attempt, initial encounter: Secondary | ICD-10-CM

## 2014-05-18 LAB — SALICYLATE LEVEL: Salicylate Lvl: <4 mg/dL (ref 2.8–20.0)

## 2014-05-18 LAB — RAPID URINE DRUG SCREEN, HOSP PERFORMED
Amphetamines: NOT DETECTED
BARBITURATES: NOT DETECTED
Benzodiazepines: POSITIVE — AB
Cocaine: NOT DETECTED
Opiates: NOT DETECTED
Tetrahydrocannabinol: NOT DETECTED

## 2014-05-18 LAB — COMPREHENSIVE METABOLIC PANEL
ALBUMIN: 4.7 g/dL (ref 3.5–5.2)
ALT: 27 U/L (ref 0–53)
AST: 27 U/L (ref 0–37)
Alkaline Phosphatase: 80 U/L (ref 39–117)
Anion gap: 10 (ref 5–15)
BILIRUBIN TOTAL: 0.5 mg/dL (ref 0.3–1.2)
BUN: 12 mg/dL (ref 6–23)
CHLORIDE: 101 mmol/L (ref 96–112)
CO2: 23 mmol/L (ref 19–32)
Calcium: 8.7 mg/dL (ref 8.4–10.5)
Creatinine, Ser: 1.08 mg/dL (ref 0.50–1.35)
GFR calc Af Amer: >90 mL/min (ref >90)
GFR calc non Af Amer: >90 mL/min (ref >90)
Glucose, Bld: 84 mg/dL (ref 70–99)
Potassium: 4 mmol/L (ref 3.5–5.1)
SODIUM: 134 mmol/L — AB (ref 135–145)
Total Protein: 8 g/dL (ref 6.0–8.3)

## 2014-05-18 LAB — ETHANOL

## 2014-05-18 LAB — CBC
HCT: 43.5 % (ref 39.0–52.0)
HEMOGLOBIN: 14.6 g/dL (ref 13.0–17.0)
MCH: 26.9 pg (ref 26.0–34.0)
MCHC: 33.6 g/dL (ref 30.0–36.0)
MCV: 80.1 fL (ref 78.0–100.0)
PLATELETS: 339 10*3/uL (ref 150–400)
RBC: 5.43 MIL/uL (ref 4.22–5.81)
RDW: 13.6 % (ref 11.5–15.5)
WBC: 6.5 10*3/uL (ref 4.0–10.5)

## 2014-05-18 LAB — ACETAMINOPHEN LEVEL: Acetaminophen (Tylenol), Serum: <10 ug/mL — ABNORMAL LOW (ref 10–30)

## 2014-05-18 LAB — LITHIUM LEVEL: Lithium Lvl: <0.06 mmol/L — ABNORMAL LOW (ref 0.80–1.40)

## 2014-05-18 MED ORDER — CEPASTAT 14.5 MG MT LOZG
1.0000 | LOZENGE | OROMUCOSAL | Status: DC | PRN
  Filled 2014-05-18: qty 9

## 2014-05-18 MED ORDER — LORATADINE 10 MG PO TABS
10.0000 mg | ORAL_TABLET | Freq: Every day | ORAL | Status: DC | PRN
  Administered 2014-05-18: 10 mg via ORAL
  Filled 2014-05-18 (×2): qty 1

## 2014-05-18 MED ORDER — LORATADINE 10 MG PO TABS: 10.0000 mg | ORAL_TABLET | Freq: Once | ORAL | Status: DC

## 2014-05-18 NOTE — BH Assessment (Signed)
[facility] Assessment Progress Note  Per Carolanne GrumblingGerald Taylor, MD, this pt does not require psychiatric hospitalization at this time.  He is to be released from petition and [facility]  [facility].  IVC has been rescinded.  Pt's discharge instructions include referral information for [facility].  Pt's nurse has been notified.  Doylene Canninghomas Lorilyn Laitinen, MA Triage Specialist 05/18/2014 @ 11:09

## 2014-05-18 NOTE — Consult Note (Signed)
[facility] Consult   Reason for Consult: agitation, aggression Referring Physician:  EDP Patient Identification: [name] MRN:  [id number] Principal Diagnosis: Schizoaffective disorder Diagnosis:   Patient Active Problem List   Diagnosis Date Noted  . Schizoaffective disorder [F25.9] 08/04/2013    Priority: High  . Asthma, chronic [J45.909] 03/25/2014  . Obesity, morbid [E66.01] 03/25/2014  . Drug overdose, intentional [T50.902A] 10/09/2013  . Aggression [F60.89] 08/22/2013    Total Time spent with patient: 1 hour  Subjective:   [name] is a 24 y.o. male patient admitted with Agitation and aggression.  HPI:  AA male, 24 years old was evaluation for agitation and aggression towards Development worker, community.  Patient was brought in for attempting to jump onto moving traffic because he was not allowed to ride with the Police.  Patient reported that he comes from a large family of Law enforcement men and that he has the right to ride with them. This morning patient is remorseful and stated that he was not thinking "right"   Patient also reported that he has not been taking his medications and that he was discharged from a hospital last Saturday and that he has not taking any medications since his dischage.  Patient stated" I know that I am off the chart without my medications, I need to get back on my medications today and I will refill my medications today"  Patient stated he need to be back on his medications.  He denied SI/HI/AVH.  Patient is discharged with referral to [facility].  HPI Elements:   Location:  Scizoaffective disorder, aggression, agitation, medication non compliant.. Quality:  severe-moderate. Severity:  moderate. Timing:  Acute. Duration:  Chronic mental illness. Context:  brought in for evaluation of mood, attempted to jump into traffic.Marland Kitchen  Past Medical History:  Past Medical History  Diagnosis Date  . Asthma   . Bipolar 1 disorder   . ODD  (oppositional defiant disorder)   . ADHD (attention deficit hyperactivity disorder)   . Schizophrenia   . Suicidal ideation   . Homicidal ideation   . Explosive personality disorder    History reviewed. No pertinent past surgical history. Family History:  Family History  Problem Relation Age of Onset  . Asthma Mother   . Asthma Sister   . Thyroid disease     Social History:  History  Alcohol Use No    Comment: sometimes     History  Drug Use No    Comment: Patient denies     History   Social History  . Marital Status: Single    Spouse Name: N/A  . Number of Children: N/A  . Years of Education: N/A   Social History Main Topics  . Smoking status: Never Smoker   . Smokeless tobacco: Not on file  . Alcohol Use: No     Comment: sometimes  . Drug Use: No     Comment: Patient denies   . Sexual Activity: Not on file   Other Topics Concern  . None   Social History Narrative   Additional Social History:    Prescriptions: See PTA list History of alcohol / drug use?: Yes Longest period of sobriety (when/how long): "don't know" Negative Consequences of Use: Legal, Work / Youth worker, Personal relationships, Financial Name of Substance 1: Alcohol 1 - Age of First Use: 23 1 - Amount (size/oz): 1-40 oz beer 1 - Frequency: weekly 1 - Duration: "long time" 1 - Last Use / Amount: last week  Allergies:   Allergies  Allergen Reactions  . Carbamazepine Anaphylaxis, Other (See Comments) and Cough    Cough up blood  . Geodon [Ziprasidone Hcl] Anaphylaxis and Other (See Comments)    " Causes my throat to close"  . Haldol [Haloperidol Lactate] Anaphylaxis and Other (See Comments)    Patient reports that he stopped taking this because of throat swelling.   Christianne Borrow [Atomoxetine] Anaphylaxis, Other (See Comments) and Cough    Cough up blood  . Other Nausea Only and Other (See Comments)    Apple Juice, stomach hurts and itchy throat     Labs:   Results for orders placed or performed during the hospital encounter of 05/17/14 (from the past 48 hour(s))  Acetaminophen level     Status: Abnormal   Collection Time: 05/17/14  9:27 PM  Result Value Ref Range   Acetaminophen (Tylenol), Serum <10.0 (L) 10 - 30 ug/mL    Comment:        THERAPEUTIC CONCENTRATIONS VARY SIGNIFICANTLY. A RANGE OF 10-30 ug/mL MAY BE AN EFFECTIVE CONCENTRATION FOR MANY PATIENTS. HOWEVER, SOME ARE BEST TREATED AT CONCENTRATIONS OUTSIDE THIS RANGE. ACETAMINOPHEN CONCENTRATIONS >150 ug/mL AT 4 HOURS AFTER INGESTION AND >50 ug/mL AT 12 HOURS AFTER INGESTION ARE OFTEN ASSOCIATED WITH TOXIC REACTIONS.   Ethanol (ETOH)     Status: None   Collection Time: 05/17/14  9:27 PM  Result Value Ref Range   Alcohol, Ethyl (B) <5 0 - 9 mg/dL    Comment:        LOWEST DETECTABLE LIMIT FOR SERUM ALCOHOL IS 11 mg/dL FOR MEDICAL PURPOSES ONLY   Salicylate level     Status: None   Collection Time: 05/17/14  9:27 PM  Result Value Ref Range   Salicylate Lvl <1.5 2.8 - 20.0 mg/dL  Valproic acid level     Status: Abnormal   Collection Time: 05/17/14  9:27 PM  Result Value Ref Range   Valproic Acid Lvl <10.0 (L) 50.0 - 100.0 ug/mL    Comment: RESULTS CONFIRMED BY MANUAL DILUTION Performed at [facility]   CBC     Status: Abnormal   Collection Time: 05/17/14  9:28 PM  Result Value Ref Range   WBC 5.8 4.0 - 10.5 K/uL   RBC 5.95 (H) 4.22 - 5.81 MIL/uL   Hemoglobin 16.1 13.0 - 17.0 g/dL   HCT 47.7 39.0 - 52.0 %   MCV 80.2 78.0 - 100.0 fL   MCH 27.1 26.0 - 34.0 pg   MCHC 33.8 30.0 - 36.0 g/dL   RDW 13.6 11.5 - 15.5 %   Platelets 336 150 - 400 K/uL  Comprehensive metabolic panel     Status: Abnormal   Collection Time: 05/17/14  9:28 PM  Result Value Ref Range   Sodium 141 135 - 145 mmol/L   Potassium 4.2 3.5 - 5.1 mmol/L   Chloride 105 96 - 112 mmol/L   CO2 26 19 - 32 mmol/L   Glucose, Bld 98 70 - 99 mg/dL   BUN 8 6 - 23 mg/dL   Creatinine, Ser 1.10  0.50 - 1.35 mg/dL   Calcium 9.9 8.4 - 10.5 mg/dL   Total Protein 9.0 (H) 6.0 - 8.3 g/dL   Albumin 5.2 3.5 - 5.2 g/dL   AST 30 0 - 37 U/L   ALT 32 0 - 53 U/L   Alkaline Phosphatase 87 39 - 117 U/L   Total Bilirubin 0.5 0.3 - 1.2 mg/dL   GFR calc non Af Amer >  90 >90 mL/min   GFR calc Af Amer >90 >90 mL/min    Comment: (NOTE) The eGFR has been calculated using the CKD EPI equation. This calculation has not been validated in all clinical situations. eGFR's persistently <90 mL/min signify possible Chronic Kidney Disease.    Anion gap 10 5 - 15    Vitals: Blood pressure 118/62, pulse 55, temperature 98.3 F (36.8 C), temperature source Oral, resp. rate 18, SpO2 97 %.  Risk to Self: Suicidal Ideation: Yes-Currently Present Suicidal Intent: Yes-Currently Present Is patient at risk for suicide?: Yes Suicidal Plan?: Yes-Currently Present Specify Current Suicidal Plan: plan and attempted to run into traffic today per pt Access to Means: Yes Specify Access to Suicidal Means: traffic What has been your use of drugs/alcohol within the last 12 months?: weekly How many times?:  (many since 24 yo) Other Self Harm Risks:  (bit self in ED and drew blood) Triggers for Past Attempts: Unpredictable Intentional Self Injurious Behavior:  (biting breaking the skin) Risk to Others: Homicidal Ideation: No-Not Currently/Within Last 6 Months Thoughts of Harm to Others: No-Not Currently Present/Within Last 6 Months Current Homicidal Intent: No-Not Currently/Within Last 6 Months Current Homicidal Plan: No-Not Currently/Within Last 6 Months Access to Homicidal Means: Yes (knives) Describe Access to Homicidal Means: knives Identified Victim: na History of harm to others?:  (pt denies actual harm to others) Assessment of Violence: None Noted Violent Behavior Description: fighting police Does patient have access to weapons?: Yes (Comment) (knives) Criminal Charges Pending?: No (denies) Does patient have a  court date: No Prior Inpatient Therapy: Prior Inpatient Therapy: Yes Prior Therapy Dates: many Prior Therapy Facilty/Provider(s): [place], [place] Reason for Treatment: SI, Aggression Prior Outpatient Therapy: Prior Outpatient Therapy: No Prior Therapy Dates: na Prior Therapy Facilty/Provider(s): na Reason for Treatment: na  Current Facility-Administered Medications  Medication Dose Route Frequency Provider Last Rate Last Dose  . acetaminophen (TYLENOL) tablet 650 mg  650 mg Oral Q4H PRN Lacretia Leigh, MD      . albuterol (PROVENTIL HFA;VENTOLIN HFA) 108 (90 BASE) MCG/ACT inhaler 1 puff  1 puff Inhalation Q6H PRN Lacretia Leigh, MD      . ARIPiprazole (ABILIFY) tablet 10 mg  10 mg Oral Daily Lacretia Leigh, MD   10 mg at 05/18/14 0908  . benztropine (COGENTIN) tablet 1 mg  1 mg Oral BID Lacretia Leigh, MD   1 mg at 05/18/14 6295  . chlorproMAZINE (THORAZINE) tablet 100 mg  100 mg Oral BID Lacretia Leigh, MD   100 mg at 05/18/14 0908  . divalproex (DEPAKOTE ER) 24 hr tablet 500 mg  500 mg Oral BID Lacretia Leigh, MD   500 mg at 05/18/14 0908  . fluticasone (FLOVENT HFA) 110 MCG/ACT inhaler 2 puff  2 puff Inhalation Q12H Lacretia Leigh, MD   2 puff at 05/18/14 0910  . ibuprofen (ADVIL,MOTRIN) tablet 600 mg  600 mg Oral Q8H PRN Lacretia Leigh, MD   600 mg at 05/18/14 0714  . loratadine (CLARITIN) tablet 10 mg  10 mg Oral Daily PRN Linton Flemings, MD   10 mg at 05/18/14 0417  . LORazepam (ATIVAN) tablet 2 mg  2 mg Oral Q6H PRN Lacretia Leigh, MD   2 mg at 05/18/14 0826  . phenol-menthol (CEPASTAT) lozenge 1 lozenge  1 lozenge Buccal PRN Linton Flemings, MD      . QUEtiapine (SEROQUEL) tablet 100 mg  100 mg Oral QHS Lacretia Leigh, MD   100 mg at 05/17/14 2240   Current Outpatient Prescriptions  Medication Sig Dispense Refill  . albuterol (PROVENTIL HFA;VENTOLIN HFA) 108 (90 BASE) MCG/ACT inhaler Inhale 1 puff into the lungs every 6 (six) hours as needed for wheezing or shortness of breath. 18 g 3  . ARIPiprazole  (ABILIFY) 10 MG tablet Take 1 tablet (10 mg total) by mouth daily. 14 tablet 0  . benztropine (COGENTIN) 1 MG tablet Take 1 tablet (1 mg total) by mouth 2 (two) times daily. 60 tablet 0  . fluticasone (FLOVENT HFA) 110 MCG/ACT inhaler Inhale 2 puffs into the lungs every 12 (twelve) hours. 1 Inhaler 12  . QUEtiapine (SEROQUEL) 100 MG tablet Take 100 mg by mouth at bedtime.    . ARIPiprazole (ABILIFY MAINTENA) 400 MG SUSR Inject 400 mg into the muscle every 30 (thirty) days.    . ARIPiprazole (ABILIFY) 10 MG tablet Take 10 mg by mouth daily.     . benztropine (COGENTIN) 1 MG tablet Take 1 tablet (1 mg total) by mouth 2 (two) times daily. 28 tablet 0  . chlorproMAZINE (THORAZINE) 100 MG tablet Take 100 mg by mouth 2 (two) times daily.    . chlorproMAZINE (THORAZINE) 100 MG tablet Take 1 tablet (100 mg total) by mouth 2 (two) times daily. 28 tablet 0  . divalproex (DEPAKOTE ER) 500 MG 24 hr tablet Take 500 mg by mouth 2 (two) times daily.    . divalproex (DEPAKOTE ER) 500 MG 24 hr tablet Take 1 tablet (500 mg total) by mouth 2 (two) times daily. 28 tablet 0  . EPINEPHrine (EPIPEN 2-PAK IJ) Inject as directed.    . traZODone (DESYREL) 50 MG tablet Take 50 mg by mouth at bedtime as needed for sleep.    . traZODone (DESYREL) 50 MG tablet Take 1 tablet (50 mg total) by mouth at bedtime as needed for sleep. 14 tablet 0  . Vitamin D, Ergocalciferol, (DRISDOL) 50000 UNITS CAPS capsule Take 1 capsule (50,000 Units total) by mouth every 7 (seven) days. 12 capsule 0    Musculoskeletal: Strength & Muscle Tone: within normal limits Gait & Station: normal Patient leans: N/A  Psychiatric Specialty Exam:     Blood pressure 118/62, pulse 55, temperature 98.3 F (36.8 C), temperature source Oral, resp. rate 18, SpO2 97 %.There is no weight on file to calculate BMI.  General Appearance: Casual and Fairly Groomed  Engineer, water::  Good  Speech:  Clear and Coherent and Normal Rate  Volume:  Normal  Mood:   Euthymic  Affect:  Congruent  Thought Process:  Coherent, Goal Directed and Intact  Orientation:  Full (Time, Place, and Person)  Thought Content:  WDL  Suicidal Thoughts:  No  Homicidal Thoughts:  No  Memory:  Immediate;   Good Recent;   Good Remote;   Good  Judgement:  Fair  Insight:  Fair  Psychomotor Activity:  Normal  Concentration:  Good  Recall:  NA  Fund of Knowledge:Fair  Language: Good  Akathisia:  NA  Handed:  Right  AIMS (if indicated):     Assets:  Desire for Improvement  ADL's:  Intact  Cognition: WNL  Sleep:      Medical Decision Making: Established Problem, Stable/Improving (1)  Treatment Plan Summary: Plan discharge home  Plan:  discharge Disposition: see above  Delfin Gant    PMHNP-BC 05/18/2014 10:51 AM

## 2014-05-18 NOTE — ED Provider Notes (Signed)
CSN: [id number]     Arrival date & time 05/18/14  2116 History   First MD Initiated Contact with Patient 05/18/14 2214     Chief Complaint  Patient presents with  . Suicidal     (Consider location/radiation/quality/duration/timing/severity/associated sxs/prior Treatment) The history is provided by the patient.   patient with overdose in a suicide attempt. Reportedly prior to arrival he took 3 Benadryl in a suicide attempt and then called the police and then took 15 more. He states he then went into traffic to try and get hit by a car but states there were no cars around. He was discharged from [facility] ER after been seen by psychiatry around 8 hours prior to the overdose. 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 Had similar complaints yesterday but has not overdosed at that time. Has a history of schizophrenia and bipolar disorder. He denies substance abuse. Patient states he feels somewhat sleepy.  Past Medical History  Diagnosis Date  . Asthma   . Bipolar 1 disorder   . ODD (oppositional defiant disorder)   . ADHD (attention deficit hyperactivity disorder)   . Schizophrenia   . Suicidal ideation   . Homicidal ideation   . Explosive personality disorder    History reviewed. No pertinent past surgical history. Family History  Problem Relation Age of Onset  . Asthma Mother   . Asthma Sister   . Thyroid disease     History  Substance Use Topics  . Smoking status: Never Smoker   . Smokeless tobacco: Not on file  . Alcohol Use: No     Comment: sometimes    Review of Systems  Constitutional: Negative for activity change.  Eyes: Negative for pain.  Respiratory: Negative for shortness of breath.   Cardiovascular: Negative for chest pain.  Gastrointestinal: Negative for abdominal pain.  Genitourinary: Negative for flank pain.  Musculoskeletal: Negative for back pain and neck stiffness.  Skin: Negative for rash.  Neurological: Negative for facial  asymmetry, weakness and headaches.  Psychiatric/Behavioral: Positive for suicidal ideas and dysphoric mood. Negative for hallucinations and behavioral problems.      Allergies  Carbamazepine; Geodon; Haldol; Strattera; and Other  Home Medications   Prior to Admission medications   Medication Sig Start Date End Date Taking? Authorizing Provider  albuterol (PROVENTIL HFA;VENTOLIN HFA) 108 (90 BASE) MCG/ACT inhaler Inhale 1 puff into the lungs every 6 (six) hours as needed for wheezing or shortness of breath. 03/25/14  Yes Deepak Advani, MD  asenapine (SAPHRIS) 5 MG SUBL 24 hr tablet Place 5 mg under the tongue 2 (two) times daily.   Yes Historical Provider, MD  diphenhydrAMINE (BENADRYL) 25 MG tablet Take 25-50 mg by mouth at bedtime as needed for itching, allergies or sleep.   Yes Historical Provider, MD  EPINEPHrine (EPIPEN 2-PAK IJ) Inject as directed.   Yes Historical Provider, MD  fluticasone (FLOVENT HFA) 110 MCG/ACT inhaler Inhale 2 puffs into the lungs every 12 (twelve) hours. 03/25/14  Yes Deepak Advani, MD  fluvoxaMINE (LUVOX) 50 MG tablet Take 50 mg by mouth at bedtime.   Yes Historical Provider, MD  QUEtiapine (SEROQUEL) 100 MG tablet Take 100 mg by mouth 2 (two) times daily.    Yes Historical Provider, MD  QUEtiapine (SEROQUEL) 100 MG tablet Take 100 mg by mouth 2 (two) times daily as needed (for mood).   Yes Historical Provider, MD  traZODone (DESYREL) 50 MG tablet Take 50 mg by mouth at bedtime as needed for  sleep.   Yes Historical Provider, MD  benztropine (COGENTIN) 1 MG tablet Take 1 tablet (1 mg total) by mouth 2 (two) times daily. 10/13/13   Beau FannyJohn C Withrow, FNP  Vitamin D, Ergocalciferol, (DRISDOL) 50000 UNITS CAPS capsule Take 1 capsule (50,000 Units total) by mouth every 7 (seven) days. Patient not taking: Reported on 05/18/2014 03/30/14   Doris Cheadleeepak Advani, MD   BP 132/77 mmHg  Pulse 104  Temp(Src) 98.9 F (37.2 C) (Oral)  Resp 16  SpO2 98% Physical Exam  Constitutional:  He is oriented to person, place, and time. He appears well-developed and well-nourished.  HENT:  Head: Normocephalic and atraumatic.  Eyes: EOM are normal. Pupils are equal, round, and reactive to light.  Neck: Normal range of motion. Neck supple.  Cardiovascular: Regular rhythm and normal heart sounds.   No murmur heard. Mild tachycardia  Pulmonary/Chest: Effort normal and breath sounds normal.  Abdominal: Soft. Bowel sounds are normal. He exhibits no distension.  Musculoskeletal: Normal range of motion. He exhibits no edema.  Neurological: He is alert and oriented to person, place, and time. No cranial nerve deficit.  Skin: Skin is warm and dry.  Psychiatric:  Somewhat strange affect.  Nursing note and vitals reviewed.   ED Course  Procedures (including critical care time) Labs Review Labs Reviewed  ACETAMINOPHEN LEVEL - Abnormal; Notable for the following:    Acetaminophen (Tylenol), Serum <10.0 (*)    All other components within normal limits  COMPREHENSIVE METABOLIC PANEL - Abnormal; Notable for the following:    Sodium 134 (*)    All other components within normal limits  URINE RAPID DRUG SCREEN (HOSP PERFORMED) - Abnormal; Notable for the following:    Benzodiazepines POSITIVE (*)    All other components within normal limits  CBC  ETHANOL  SALICYLATE LEVEL  VALPROIC ACID LEVEL  LITHIUM LEVEL    Imaging Review No results found.   EKG Interpretation None     <ECG>    MDM   Final diagnoses:  Suicide attempt  Drug overdose, intentional, initial encounter    Patient reportedly overdosed on Benadryl in a suicide attempt. At around 8:00 today. Will need monitoring until not tachycardic. Has been involuntarily committed and will need to be seen by TTS. Patient is not tachycardic at this time. Mildly sedate. Appears to medically cleared and be seen by TTS.     Benjiman CoreNathan Edmund Holcomb, MD 05/19/14 [phone]

## 2014-05-18 NOTE — ED Notes (Signed)
AC and staffing notified of need for sitter

## 2014-05-18 NOTE — ED Notes (Addendum)
Pt states he attempted suicide to day by initially taking Benadryl 25mg  3 tabs then after calling 911 he took 15 more. Pt states he does did not take more because he wants to die but slowly. Pt also drank half of a Colt 45 40oz. Pt A & O, playful banter. Pt refusing to take Charcoal unless we IVC him. [employer] at bedside.

## 2014-05-18 NOTE — [facility] Suicide Risk Assessment (Cosign Needed)
Suicide Risk Assessment  Discharge Assessment   [facility]   Demographic Factors:  Male, Adolescent or young adult, Low socioeconomic status, Living alone and Unemployed  Total Time spent with patient: 20 minutes  Musculoskeletal: Strength & Muscle Tone: within normal limits Gait & Station: normal Patient leans: N/A  Psychiatric Specialty Exam:     Blood pressure 118/62, pulse 55, temperature 98.3 F (36.8 C), temperature source Oral, resp. rate 18, SpO2 97 %.There is no weight on file to calculate BMI.  General Appearance: Casual and Fairly Groomed  Eye Contact::  Good  Speech:  Clear and Coherent and Normal Rate409  Volume:  Normal  Mood:  Euthymic  Affect:  Congruent  Thought Process:  Coherent, Goal Directed and Intact  Orientation:  Full (Time, Place, and Person)  Thought Content:  WDL  Suicidal Thoughts:  No  Homicidal Thoughts:  No  Memory:  Immediate;   Good Recent;   Good Remote;   Good  Judgement:  Fair  Insight:  Fair  Psychomotor Activity:  Normal  Concentration:  Fair  Recall:  NA  Fund of Knowledge:Fair  Language: Good  Akathisia:  NA  Handed:  Right  AIMS (if indicated):     Assets:  Desire for Improvement  Sleep:     Cognition: WNL  ADL's:  Intact      Has this patient used any form of tobacco in the last 30 days? (Cigarettes, Smokeless Tobacco, Cigars, and/or Pipes) Yes, A prescription for an FDA-approved tobacco cessation medication was offered at discharge and the patient refused  Mental Status Per Nursing Assessment::   On Admission:     Current Mental Status by Physician: NA  Loss Factors: NA  Historical Factors: NA  Risk Reduction Factors:   Positive social support and Has an ACT Team  Continued Clinical Symptoms:  Bipolar Disorder:   Depressive phase More than one psychiatric diagnosis  Cognitive Features That Contribute To Risk:  Polarized thinking    Suicide Risk:  Minimal: No identifiable  suicidal ideation.  Patients presenting with no risk factors but with morbid ruminations; may be classified as minimal risk based on the severity of the depressive symptoms  Principal Problem: <principal problem not specified> Discharge Diagnoses:  Patient Active Problem List   Diagnosis Date Noted  . Asthma, chronic [J45.909] 03/25/2014  . Obesity, morbid [E66.01] 03/25/2014  . Drug overdose, intentional [T50.902A] 10/09/2013  . Aggression [F60.89] 08/22/2013  . Schizoaffective disorder [F25.9] 08/04/2013      Plan Of Care/Follow-up recommendations:  Activity:  As tolerated Diet:  Regular  Is patient on multiple antipsychotic therapies at discharge:  Yes,   Do you recommend tapering to monotherapy for antipsychotics?  No   Has Patient had three or more failed trials of antipsychotic monotherapy by history:  No  Recommended Plan for Multiple Antipsychotic Therapies: NA    Faryal Marxen C   PMHNP-BC 05/18/2014, 10:45 AM

## 2014-05-18 NOTE — ED Notes (Addendum)
Per [employer] pt states that he wanted to kill himself by jumping into traffic. Also states he took 18 benadryl. Pt was seen yesterday at this facility. Currently voluntary. Alert and oriented.

## 2014-05-18 NOTE — Discharge Instructions (Signed)
For your ongoing mental health needs, you are advised to follow up with [facility].  New and returning patients are seen at their walk-in clinic.  Walk-in hours are Monday - Friday from 8:00 am - 3:00 pm.  Walk-in patients are seen on a first come, first served basis.  Try to arrive as early as possible for he best chance of being seen the same day: ° °     [facility] °     [address] °     [place], [place] [address] °     [phone] °

## 2014-05-18 NOTE — ED Notes (Signed)
Calm and cooperative this morning. Testing limits a little but generally redirectable. Denies SI/HI/AVH and contracts for safety. Dressing to left hand changed. Wound bed red and clean. No drainage noted. Cleansed with normal saline and gauze applied. States he is eager for discharge. Did complain of a headache and prn provided. Otherwise offered no questions or concerns. Safety has been maintained with q15 minute observation. Will continue current POC

## 2014-05-18 NOTE — ED Notes (Signed)
Bed: [facility] Expected date:  Expected time:  Means of arrival:  Comments: [employer] F SI

## 2014-05-18 NOTE — ED Notes (Signed)
Pt AAO x 3, cooperative at present, calm, no distress noted, will continue to monitor for safety, q 15 min checks in effect.

## 2014-05-19 ENCOUNTER — Emergency Department (EMERGENCY_DEPARTMENT_HOSPITAL)
Admission: EM | Admit: 2014-05-19 | Discharge: 2014-05-20 | Disposition: A | Discharge status: Home / Self Care | Payer: Federal, State, Local not specified - Other | Source: Home / Self Care | Attending: Emergency Medicine | Admitting: Emergency Medicine

## 2014-05-19 DIAGNOSIS — T450X2A Poisoning by antiallergic and antiemetic drugs, intentional self-harm, initial encounter: Secondary | ICD-10-CM

## 2014-05-19 DIAGNOSIS — F4325 Adjustment disorder with mixed disturbance of emotions and conduct: Secondary | ICD-10-CM

## 2014-05-19 DIAGNOSIS — F250 Schizoaffective disorder, bipolar type: Secondary | ICD-10-CM | POA: Diagnosis not present

## 2014-05-19 DIAGNOSIS — Y929 Unspecified place or not applicable: Secondary | ICD-10-CM

## 2014-05-19 LAB — RAPID URINE DRUG SCREEN, HOSP PERFORMED
Amphetamines: NOT DETECTED
BARBITURATES: NOT DETECTED
Benzodiazepines: POSITIVE — AB
Cocaine: NOT DETECTED
Opiates: NOT DETECTED
TETRAHYDROCANNABINOL: NOT DETECTED

## 2014-05-19 LAB — CBC
HCT: 43.9 % (ref 39.0–52.0)
HEMOGLOBIN: 14.7 g/dL (ref 13.0–17.0)
MCH: 27 pg (ref 26.0–34.0)
MCHC: 33.5 g/dL (ref 30.0–36.0)
MCV: 80.7 fL (ref 78.0–100.0)
Platelets: 338 10*3/uL (ref 150–400)
RBC: 5.44 MIL/uL (ref 4.22–5.81)
RDW: 13.7 % (ref 11.5–15.5)
WBC: 5.7 10*3/uL (ref 4.0–10.5)

## 2014-05-19 LAB — ACETAMINOPHEN LEVEL

## 2014-05-19 LAB — COMPREHENSIVE METABOLIC PANEL
ALBUMIN: 4.7 g/dL (ref 3.5–5.2)
ALT: 28 U/L (ref 0–53)
AST: 27 U/L (ref 0–37)
Alkaline Phosphatase: 79 U/L (ref 39–117)
Anion gap: 9 (ref 5–15)
BUN: 7 mg/dL (ref 6–23)
CO2: 26 mmol/L (ref 19–32)
CREATININE: 1.02 mg/dL (ref 0.50–1.35)
Calcium: 9.1 mg/dL (ref 8.4–10.5)
Chloride: 103 mmol/L (ref 96–112)
GFR calc Af Amer: >90 mL/min (ref >90)
GFR calc non Af Amer: >90 mL/min (ref >90)
GLUCOSE: 87 mg/dL (ref 70–99)
Potassium: 4 mmol/L (ref 3.5–5.1)
Sodium: 138 mmol/L (ref 135–145)
Total Bilirubin: 0.5 mg/dL (ref 0.3–1.2)
Total Protein: 8.1 g/dL (ref 6.0–8.3)

## 2014-05-19 LAB — SALICYLATE LEVEL: Salicylate Lvl: <4 mg/dL (ref 2.8–20.0)

## 2014-05-19 LAB — ETHANOL: Alcohol, Ethyl (B): <5 mg/dL (ref 0–9)

## 2014-05-19 LAB — VALPROIC ACID LEVEL: Valproic Acid Lvl: 20.8 ug/mL — ABNORMAL LOW (ref 50.0–100.0)

## 2014-05-19 MED ORDER — QUETIAPINE FUMARATE 100 MG PO TABS: 100.0000 mg | ORAL_TABLET | Freq: Two times a day (BID) | ORAL | Status: DC | PRN

## 2014-05-19 MED ORDER — LORAZEPAM 2 MG/ML IJ SOLN
2.0000 mg | Freq: Once | INTRAMUSCULAR | Status: AC
  Administered 2014-05-19: 2 mg via INTRAMUSCULAR
  Filled 2014-05-19: qty 1

## 2014-05-19 MED ORDER — QUETIAPINE FUMARATE 100 MG PO TABS
100.0000 mg | ORAL_TABLET | Freq: Two times a day (BID) | ORAL | Status: DC | PRN
Start: 2014-05-19 — End: 2014-05-20

## 2014-05-19 MED ORDER — FLUVOXAMINE MALEATE 50 MG PO TABS
50.0000 mg | ORAL_TABLET | Freq: Every day | ORAL | Status: DC
  Administered 2014-05-19: 50 mg via ORAL
  Filled 2014-05-19 (×2): qty 1

## 2014-05-19 MED ORDER — TRAZODONE HCL 50 MG PO TABS: 50.0000 mg | ORAL_TABLET | Freq: Every evening | ORAL | Status: DC | PRN

## 2014-05-19 MED ORDER — ASENAPINE MALEATE 5 MG SL SUBL
5.0000 mg | SUBLINGUAL_TABLET | Freq: Two times a day (BID) | SUBLINGUAL | Status: DC
  Filled 2014-05-19 (×3): qty 1

## 2014-05-19 MED ORDER — LORAZEPAM 2 MG/ML IJ SOLN
3.0000 mg | Freq: Once | INTRAMUSCULAR | Status: AC
  Administered 2014-05-19: 3 mg via INTRAVENOUS
  Filled 2014-05-19: qty 2

## 2014-05-19 MED ORDER — ALBUTEROL SULFATE HFA 108 (90 BASE) MCG/ACT IN AERS: 1.0000 | INHALATION_SPRAY | Freq: Four times a day (QID) | RESPIRATORY_TRACT | Status: DC | PRN

## 2014-05-19 MED ORDER — FLUVOXAMINE MALEATE 50 MG PO TABS
50.0000 mg | ORAL_TABLET | Freq: Every day | ORAL | Status: DC
  Filled 2014-05-19: qty 1

## 2014-05-19 MED ORDER — FLUTICASONE PROPIONATE HFA 110 MCG/ACT IN AERO
2.0000 | INHALATION_SPRAY | Freq: Two times a day (BID) | RESPIRATORY_TRACT | Status: DC
  Filled 2014-05-19: qty 12

## 2014-05-19 MED ORDER — HYDROXYZINE HCL 25 MG PO TABS
50.0000 mg | ORAL_TABLET | Freq: Once | ORAL | Status: AC
  Administered 2014-05-19: 50 mg via ORAL
  Filled 2014-05-19: qty 2

## 2014-05-19 MED ORDER — ASENAPINE MALEATE 5 MG SL SUBL
5.0000 mg | SUBLINGUAL_TABLET | Freq: Two times a day (BID) | SUBLINGUAL | Status: DC
  Administered 2014-05-19 – 2014-05-20 (×2): 5 mg via SUBLINGUAL
  Filled 2014-05-19 (×4): qty 1
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
## 2014-05-19 NOTE — ED Notes (Addendum)
Pt. In burgundy scrubs. Pt. And belongings searched and wanded by security. Pt. Has 1 belongings bag. Pt. Has 1 purple bag. Pt. Has black boots, black sweat pants,, black shorts, and blue and black shorts, red and gray socks, gray t-shirt, and camouflage t-shirt. Pt. Belongings locked up at the nurses station in the blue zone.

## 2014-05-19 NOTE — ED Notes (Signed)
Pt again asked about who he was going to see in the morning.  Pt informed that he would see the psychiatrist in the morning.  Pt then raised his right hand wiggled all his fingers and said "I think my hand is broke.   I need to see the Doctor and get an xray."  Pt advised that if he could move his hand and wiggle his fingers without grimacing he probably didn't have a broken hand.  Pt was satisfied with answer.  Pt again asked if he wanted his PRN medication and he refused

## 2014-05-19 NOTE — [facility] Suicide Risk Assessment (Signed)
Suicide Risk Assessment  Discharge Assessment   [facility]   Demographic Factors:  Male, Adolescent or young adult, Low socioeconomic status, Living alone and Unemployed  Total Time spent with patient: 20 minutes  Musculoskeletal: Strength & Muscle Tone: within normal limits Gait & Station: normal Patient leans: N/A  Psychiatric Specialty Exam:     Blood pressure 132/77, pulse 104, temperature 98.9 F (37.2 C), temperature source Oral, resp. rate 16, height 5\' 10"  (1.778 m), weight 136.079 kg (300 lb), SpO2 98 %.Body mass index is 43.05 kg/(m^2).  General Appearance: Casual and Fairly Groomed  Eye Contact::  Good  Speech:  Clear and Coherent and Normal Rate409  Volume:  Normal  Mood:  Euthymic  Affect:  Congruent  Thought Process:  Coherent, Goal Directed and Intact  Orientation:  Full (Time, Place, and Person)  Thought Content:  WDL  Suicidal Thoughts:  No  Homicidal Thoughts:  No  Memory:  Immediate;   Good Recent;   Good Remote;   Good  Judgement:  Fair  Insight:  Fair  Psychomotor Activity:  Normal  Concentration:  Fair  Recall:  NA  Fund of Knowledge:Fair  Language: Good  Akathisia:  NA  Handed:  Right  AIMS (if indicated):     Assets:  Desire for Improvement  Sleep:     Cognition: WNL  ADL's:  Intact      Has this patient used any form of tobacco in the last 30 days? (Cigarettes, Smokeless Tobacco, Cigars, and/or Pipes) Yes, A prescription for an FDA-approved tobacco cessation medication was offered at discharge and the patient refused  Mental Status Per Nursing Assessment::   On Admission:     Current Mental Status by Physician: NA  Loss Factors: NA  Historical Factors: NA  Risk Reduction Factors:   Positive social support and Has an ACT Team  Continued Clinical Symptoms:  Bipolar Disorder:   Depressive phase More than one psychiatric diagnosis  Cognitive Features That Contribute To Risk:  Polarized thinking     Suicide Risk:  Minimal: No identifiable suicidal ideation.  Patients presenting with no risk factors but with morbid ruminations; may be classified as minimal risk based on the severity of the depressive symptoms  Principal Problem: <principal problem not specified> Discharge Diagnoses:  Patient Active Problem List   Diagnosis Date Noted  . Schizoaffective disorder [F25.9] 08/04/2013    Priority: High  . Asthma, chronic [J45.909] 03/25/2014  . Obesity, morbid [E66.01] 03/25/2014  . Drug overdose, intentional [T50.902A] 10/09/2013  . Aggression [F60.89] 08/22/2013    Follow-up Information    [facility]   [facility]   Contact information:   [address] [place] [address] [phone]       Plan Of Care/Follow-up recommendations:  Activity:  As tolerated Diet:  Regular  Is patient on multiple antipsychotic therapies at discharge:  Yes,   Do you recommend tapering to monotherapy for antipsychotics?  No   Has Patient had three or more failed trials of antipsychotic monotherapy by history:  No  Recommended Plan for Multiple Antipsychotic Therapies: NA    Bernestine Holsapple C   PMHNP-BC 05/19/2014, 11:49 AM

## 2014-05-19 NOTE — ED Notes (Signed)
Per EMS: Pt took 50 Benadryl tablets around 1930 as suicide attempt and states he was planning on walking in front of a car. Pt reports feeling hot and tingly all over, and thinks his chest "might be hurting." Alert but refusing to answer questions appropriately. Pt just discharged from hospital around 1300 today.

## 2014-05-19 NOTE — ED Provider Notes (Addendum)
CSN: [id number]     Arrival date & time 05/19/14  2017 History   First MD Initiated Contact with Patient 05/19/14 2041     Chief Complaint  Patient presents with  . Benadryl Overdose      (Consider location/radiation/quality/duration/timing/severity/associated sxs/prior Treatment) HPI Comments: Patient here after allegedly taking 50 Benadryl tablets prior to arrival. I have seen this patient recently for similar symptoms and was discharged today. He also states he drank alcohol but is unsure of what type. States that the voices have told him to take his life. He also notes visual hallucinations. States compliance with his medications. Mother called EMS and he presents at this time.  The history is provided by the patient and the police. The history is limited by the condition of the patient.    Past Medical History  Diagnosis Date  . Asthma   . Bipolar 1 disorder   . ODD (oppositional defiant disorder)   . ADHD (attention deficit hyperactivity disorder)   . Schizophrenia   . Suicidal ideation   . Homicidal ideation   . Explosive personality disorder    No past surgical history on file. Family History  Problem Relation Age of Onset  . Asthma Mother   . Asthma Sister   . Thyroid disease     History  Substance Use Topics  . Smoking status: Never Smoker   . Smokeless tobacco: Not on file  . Alcohol Use: No     Comment: sometimes    Review of Systems  Unable to perform ROS     Allergies  Carbamazepine; Geodon; Haldol; Strattera; Other; and Shellfish allergy  Home Medications   Prior to Admission medications   Medication Sig Start Date End Date Taking? Authorizing Provider  albuterol (PROVENTIL HFA;VENTOLIN HFA) 108 (90 BASE) MCG/ACT inhaler Inhale 1 puff into the lungs every 6 (six) hours as needed for wheezing or shortness of breath. 03/25/14   Doris Cheadleeepak Advani, MD  asenapine (SAPHRIS) 5 MG SUBL 24 hr tablet Place 5 mg under the tongue 2 (two) times daily.    Historical  Provider, MD  benztropine (COGENTIN) 1 MG tablet Take 1 tablet (1 mg total) by mouth 2 (two) times daily. Patient not taking: Reported on 05/18/2014 10/13/13   Beau FannyJohn C Withrow, FNP  EPINEPHrine (EPIPEN 2-PAK IJ) Inject as directed.    Historical Provider, MD  fluticasone (FLOVENT HFA) 110 MCG/ACT inhaler Inhale 2 puffs into the lungs every 12 (twelve) hours. 03/25/14   Doris Cheadleeepak Advani, MD  fluvoxaMINE (LUVOX) 50 MG tablet Take 50 mg by mouth at bedtime.    Historical Provider, MD  QUEtiapine (SEROQUEL) 100 MG tablet Take 100 mg by mouth 2 (two) times daily as needed (for mood).    Historical Provider, MD  traZODone (DESYREL) 50 MG tablet Take 50 mg by mouth at bedtime as needed for sleep.    Historical Provider, MD  Vitamin D, Ergocalciferol, (DRISDOL) 50000 UNITS CAPS capsule Take 1 capsule (50,000 Units total) by mouth every 7 (seven) days. Patient not taking: Reported on 05/18/2014 03/30/14   Doris Cheadleeepak Advani, MD   BP 143/78 mmHg  Pulse 106  Temp(Src) 98.4 F (36.9 C) (Oral)  Resp 18  SpO2 97% Physical Exam  Constitutional: He is oriented to person, place, and time. He appears well-developed and well-nourished.  Non-toxic appearance. No distress.  HENT:  Head: Normocephalic and atraumatic.  Eyes: Conjunctivae, EOM and lids are normal. Pupils are equal, round, and reactive to light.  Neck: Normal range of motion. Neck  supple. No tracheal deviation present. No thyroid mass present.  Cardiovascular: Normal rate, regular rhythm and normal heart sounds.  Exam reveals no gallop.   No murmur heard. Pulmonary/Chest: Effort normal and breath sounds normal. No stridor. No respiratory distress. He has no decreased breath sounds. He has no wheezes. He has no rhonchi. He has no rales.  Abdominal: Soft. Normal appearance and bowel sounds are normal. He exhibits no distension. There is no tenderness. There is no rebound and no CVA tenderness.  Musculoskeletal: Normal range of motion. He exhibits no edema or  tenderness.  Neurological: He is alert and oriented to person, place, and time. He has normal strength. No cranial nerve deficit or sensory deficit. GCS eye subscore is 4. GCS verbal subscore is 5. GCS motor subscore is 6.  Skin: Skin is warm and dry. No abrasion and no rash noted.  Psychiatric: His speech is normal. His affect is blunt. He is agitated. Thought content is paranoid. He expresses suicidal ideation.  Nursing note and vitals reviewed.   ED Course  Procedures (including critical care time) Labs Review Labs Reviewed  CBC  COMPREHENSIVE METABOLIC PANEL  ETHANOL  ACETAMINOPHEN LEVEL  SALICYLATE LEVEL  URINE RAPID DRUG SCREEN (HOSP PERFORMED)    Imaging Review No results found.   EKG Interpretation   Date/Time:  Wednesday May 19 2014 20:31:49 EDT Ventricular Rate:  97 PR Interval:  143 QRS Duration: 93 QT Interval:  332 QTC Calculation: 422 R Axis:   42 Text Interpretation:  Sinus rhythm No significant change since last  tracing Confirmed by Asyria Kolander  MD, Nabilah Davoli ([id number]) on 05/19/2014 8:53:43 PM      MDM   Final diagnoses:  None    Patient to be medically cleared and then seen by psychiatry  10:42 PM Patient states that he took alcohol this evening as well 2. His alcohol level here is 0. He is awake and alert here. I doubt that he took Benadryl. His EKG is without acute changes. Patient placed under IVC and will consult psychiatry  11:32 PM Patient given Ativan and Vistaril for sedation. He has an allergy to Cindee Lame, MD 05/19/14 6045  Lorre Nick, MD 05/19/14 4098  Lorre Nick, MD 05/19/14 [phone]

## 2014-05-19 NOTE — ED Notes (Signed)
Pt pulled out IV. Pt reports hallucinations and voices, states he sees two people in the room with him but states he can't tell us what they are saying or else they will hurt him.  Dr Freida BusmanAllen aware.

## 2014-05-19 NOTE — ED Notes (Signed)
EKG given to EDP,Allen,MD., for review. 

## 2014-05-19 NOTE — Consult Note (Signed)
[name]   Reason for Singleton: agitation, aggression Referring Physician:  EDP Patient Identification: [name] MRN:  [id number] Principal Diagnosis: <principal problem not specified> Diagnosis:   Patient Active Problem List   Diagnosis Date Noted  . Schizoaffective disorder [F25.9] 08/04/2013    Priority: High  . Asthma, chronic [J45.909] 03/25/2014  . Obesity, morbid [E66.01] 03/25/2014  . Drug overdose, intentional [T50.902A] 10/09/2013  . Aggression [F60.89] 08/22/2013    Total Time spent with patient: 1 hour  Subjective:   [name] is a 24 y.o. Singleton patient admitted for OD on Benadryl  HPI:  [name], 25 years old was discharged home from here yesterday but was brought bact last night   for OD on Benadryl for for insomnia..  Patient stated he also drank half of 40 OZ Colt beer. He stated that he did not want to take more Benadryl  Because he wanted a" slow death" . This morning patient is remorseful and stated that he was not thinking "right"  He was supposed to go to [facility] yesterday but did not do so.   Patient also reported that he has not been taking his medications since his discharge from [facility] last Saturday and that he has n.  Patient was smiling during the assessment and stated that he will be going to [facility] as soon as he leaves the hospital today.  Patient stated he need to be back on his medications and is in the process of being referred to an ACT team by [facility].  He denied SI/HI/AVH.  Patient is discharged with referral to [facility].  HPI Elements:   Location:  Scizoaffective disorder, OD on Benadryl medication non compliant.. Quality:  severe-moderate. Severity:  moderate. Timing:  acute. Duration:  Chronic mental illness. Context:  brought in for evaluation of mood,OD on 15 tablets of Benadryl.  Past Medical History:  Past Medical History  Diagnosis Date  . Asthma   . Bipolar 1 disorder   . ODD (oppositional defiant  disorder)   . ADHD (attention deficit hyperactivity disorder)   . Schizophrenia   . Suicidal ideation   . Homicidal ideation   . Explosive personality disorder    History reviewed. No pertinent past surgical history. Family History:  Family History  Problem Relation Age of Onset  . Asthma Mother   . Asthma Sister   . Thyroid disease     Social History:  History  Alcohol Use No    Comment: sometimes     History  Drug Use No    Comment: Patient denies     History   Social History  . Marital Status: Single    Spouse Name: N/A  . Number of Children: N/A  . Years of Education: N/A   Social History Main Topics  . Smoking status: Never Smoker   . Smokeless tobacco: Not on file  . Alcohol Use: No     Comment: sometimes  . Drug Use: No     Comment: Patient denies   . Sexual Activity: Not on file   Other Topics Concern  . None   Social History Narrative   Additional Social History:    Pain Medications: SEE MAR Prescriptions: SEE MAR; "The only thing I know that I take is Abilify.Marland KitchenMarland KitchenI don't know the names or dosages" Over the Counter: SEE MAR History of alcohol / drug use?: Yes Name of Substance 1: Alcohol [name], [employer]'s Vodka, Colt 6) 1 - Age of First Use: 23 1 - Amount (  size/oz): "I drink 1/2 of the bottles" 1 - Frequency: "I am social drinker or stress drinker" 1 - Duration: since age 82 1 - Last Use / Amount: "Last night I drank some wine"   Allergies:   Allergies  Allergen Reactions  . Carbamazepine Anaphylaxis, Other (See Comments) and Cough    Cough up blood  . Geodon [Ziprasidone Hcl] Anaphylaxis and Other (See Comments)    " Causes my throat to close"  . Haldol [Haloperidol Lactate] Anaphylaxis and Other (See Comments)    Patient reports that he stopped taking this because of throat swelling.   [name] [Atomoxetine] Anaphylaxis, Other (See Comments) and Cough    Cough up blood  . Other Nausea Only and Other (See Comments)    Apple  Juice, stomach hurts and itchy throat   . Shellfish Allergy Swelling    Throat swelling    Labs:  Results for orders placed or performed during the hospital encounter of 05/18/14 (from the past 48 hour(s))  Urine Drug Screen     Status: Abnormal   Collection Time: 05/18/14 10:39 PM  Result Value Ref Range   Opiates NONE DETECTED NONE DETECTED   Cocaine NONE DETECTED NONE DETECTED   Benzodiazepines POSITIVE (A) NONE DETECTED   Amphetamines NONE DETECTED NONE DETECTED   Tetrahydrocannabinol NONE DETECTED NONE DETECTED   Barbiturates NONE DETECTED NONE DETECTED    Comment:        DRUG SCREEN FOR MEDICAL PURPOSES ONLY.  IF CONFIRMATION IS NEEDED FOR ANY PURPOSE, NOTIFY LAB WITHIN 5 DAYS.        LOWEST DETECTABLE LIMITS FOR URINE DRUG SCREEN Drug Class       Cutoff (ng/mL) Amphetamine      1000 Barbiturate      200 Benzodiazepine   833 Tricyclics       825 Opiates          300 Cocaine          300 THC              50   Valproic acid level     Status: Abnormal   Collection Time: 05/18/14 11:03 PM  Result Value Ref Range   Valproic Acid Lvl 20.8 (L) 50.0 - 100.0 ug/mL    Comment: Performed at [facility]  Acetaminophen level     Status: Abnormal   Collection Time: 05/18/14 11:04 PM  Result Value Ref Range   Acetaminophen (Tylenol), Serum <10.0 (L) 10 - 30 ug/mL    Comment:        THERAPEUTIC CONCENTRATIONS VARY SIGNIFICANTLY. A RANGE OF 10-30 ug/mL MAY BE AN EFFECTIVE CONCENTRATION FOR MANY PATIENTS. HOWEVER, SOME ARE BEST TREATED AT CONCENTRATIONS OUTSIDE THIS RANGE. ACETAMINOPHEN CONCENTRATIONS >150 ug/mL AT 4 HOURS AFTER INGESTION AND >50 ug/mL AT 12 HOURS AFTER INGESTION ARE OFTEN ASSOCIATED WITH TOXIC REACTIONS.   CBC     Status: None   Collection Time: 05/18/14 11:04 PM  Result Value Ref Range   WBC 6.5 4.0 - 10.5 K/uL   RBC 5.43 4.22 - 5.81 MIL/uL   Hemoglobin 14.6 13.0 - 17.0 g/dL   HCT 43.5 39.0 - 52.0 %   MCV 80.1 78.0 - 100.0 fL   MCH 26.9  26.0 - 34.0 pg   MCHC 33.6 30.0 - 36.0 g/dL   RDW 13.6 11.5 - 15.5 %   Platelets 339 150 - 400 K/uL  Comprehensive metabolic panel     Status: Abnormal   Collection Time: 05/18/14 11:04 PM  Result Value Ref Range   Sodium 134 (L) 135 - 145 mmol/L    Comment: DELTA CHECK NOTED REPEATED TO VERIFY    Potassium 4.0 3.5 - 5.1 mmol/L   Chloride 101 96 - 112 mmol/L   CO2 23 19 - 32 mmol/L   Glucose, Bld 84 70 - 99 mg/dL   BUN 12 6 - 23 mg/dL   Creatinine, Ser 1.08 0.50 - 1.35 mg/dL   Calcium 8.7 8.4 - 10.5 mg/dL   Total Protein 8.0 6.0 - 8.3 g/dL   Albumin 4.7 3.5 - 5.2 g/dL   AST 27 0 - 37 U/L   ALT 27 0 - 53 U/L   Alkaline Phosphatase 80 39 - 117 U/L   Total Bilirubin 0.5 0.3 - 1.2 mg/dL   GFR calc non Af Amer >90 >90 mL/min   GFR calc Af Amer >90 >90 mL/min    Comment: (NOTE) The eGFR has been calculated using the CKD EPI equation. This calculation has not been validated in all clinical situations. eGFR's persistently <90 mL/min signify possible Chronic Kidney Disease.    Anion gap 10 5 - 15  Ethanol (ETOH)     Status: None   Collection Time: 05/18/14 11:04 PM  Result Value Ref Range   Alcohol, Ethyl (B) <5 0 - 9 mg/dL    Comment:        LOWEST DETECTABLE LIMIT FOR SERUM ALCOHOL IS 11 mg/dL FOR MEDICAL PURPOSES ONLY   Salicylate level     Status: None   Collection Time: 05/18/14 11:04 PM  Result Value Ref Range   Salicylate Lvl <1.6 2.8 - 20.0 mg/dL  Lithium level     Status: Abnormal   Collection Time: 05/18/14 11:04 PM  Result Value Ref Range   Lithium Lvl <0.06 (L) 0.80 - 1.40 mmol/L    Vitals: Blood pressure 132/77, pulse 104, temperature 98.9 F (37.2 C), temperature source Oral, resp. rate 16, height $RemoveBe'5\' 10"'VOzOJUlYn$  (1.778 m), weight 136.079 kg (300 lb), SpO2 98 %.  Risk to Self: Suicidal Ideation: Yes-Currently Present Suicidal Intent: Yes-Currently Present Is patient at risk for suicide?: Yes Suicidal Plan?: Yes-Currently Present Specify Current Suicidal Plan:   ("I took 18 Benadryl and walk in traffic") Access to Means: Yes Specify Access to Suicidal Means:  (access to traffic and pills) What has been your use of drugs/alcohol within the last 12 months?:  (alcohol) How many times?:  ("Many since age 12") Other Self Harm Risks:  (bit self in ED and drew blood, "cutter") Triggers for Past Attempts: Unpredictable Intentional Self Injurious Behavior: Cutting (cutting and biting) Comment - Self Injurious Behavior:  (cutting and biting ) Risk to Others: Homicidal Ideation: No Thoughts of Harm to Others: No Current Homicidal Intent: No Current Homicidal Plan: No Access to Homicidal Means: No Describe Access to Homicidal Means:  (n/a) Identified Victim:  (n/a) History of harm to others?: No Assessment of Violence: None Noted Violent Behavior Description:  (patient calm and cooperative but sts he feels irritable) Does patient have access to weapons?: No Criminal Charges Pending?: No Does patient have a court date: No Prior Inpatient Therapy: Prior Inpatient Therapy: Yes Prior Therapy Dates: many Prior Therapy Facilty/Provider(s): [facility], [place] Reason for Treatment: SI, Aggression Prior Outpatient Therapy: Prior Outpatient Therapy: Yes Prior Therapy Dates: na (current) Prior Therapy Facilty/Provider(s):  [facility] ) Reason for Treatment:  (med managment)  Current Facility-Administered Medications  Medication Dose Route Frequency Provider Last Rate Last Dose  . asenapine (SAPHRIS) sublingual tablet 5 mg  5 mg Sublingual BID Rolland Porter, MD   5 mg at 05/19/14 0086  . fluticasone (FLOVENT HFA) 110 MCG/ACT inhaler 2 puff  2 puff Inhalation Q12H Rolland Porter, MD   2 puff at 05/19/14 0337  . fluvoxaMINE (LUVOX) tablet 50 mg  50 mg Oral QHS Rolland Porter, MD      . QUEtiapine (SEROQUEL) tablet 100 mg  100 mg Oral BID PRN Rolland Porter, MD      . traZODone (DESYREL) tablet 50 mg  50 mg Oral QHS PRN Rolland Porter, MD       Current Outpatient Prescriptions  Medication Sig  Dispense Refill  . albuterol (PROVENTIL HFA;VENTOLIN HFA) 108 (90 BASE) MCG/ACT inhaler Inhale 1 puff into the lungs every 6 (six) hours as needed for wheezing or shortness of breath. 18 g 3  . asenapine (SAPHRIS) 5 MG SUBL 24 hr tablet Place 5 mg under the tongue 2 (two) times daily.    . diphenhydrAMINE (BENADRYL) 25 MG tablet Take 25-50 mg by mouth at bedtime as needed for itching, allergies or sleep.    Marland Kitchen EPINEPHrine (EPIPEN 2-PAK IJ) Inject as directed.    . fluticasone (FLOVENT HFA) 110 MCG/ACT inhaler Inhale 2 puffs into the lungs every 12 (twelve) hours. 1 Inhaler 12  . fluvoxaMINE (LUVOX) 50 MG tablet Take 50 mg by mouth at bedtime.    Marland Kitchen QUEtiapine (SEROQUEL) 100 MG tablet Take 100 mg by mouth 2 (two) times daily as needed (for mood).    . traZODone (DESYREL) 50 MG tablet Take 50 mg by mouth at bedtime as needed for sleep.    . benztropine (COGENTIN) 1 MG tablet Take 1 tablet (1 mg total) by mouth 2 (two) times daily. (Patient not taking: Reported on 05/18/2014) 60 tablet 0  . Vitamin D, Ergocalciferol, (DRISDOL) 50000 UNITS CAPS capsule Take 1 capsule (50,000 Units total) by mouth every 7 (seven) days. (Patient not taking: Reported on 05/18/2014) 12 capsule 0    Musculoskeletal: Strength & Muscle Tone: within normal limits Gait & Station: normal Patient leans: N/A  Psychiatric Specialty Exam:     Blood pressure 132/77, pulse 104, temperature 98.9 F (37.2 C), temperature source Oral, resp. rate 16, height $RemoveBe'5\' 10"'UzERNipKa$  (1.778 m), weight 136.079 kg (300 lb), SpO2 98 %.Body mass index is 43.05 kg/(m^2).  General Appearance: Casual and Fairly Groomed  Engineer, water::  Good  Speech:  Clear and Coherent and Normal Rate  Volume:  Normal  Mood:  Euthymic  Affect:  Congruent  Thought Process:  Coherent, Goal Directed and Intact  Orientation:  Full (Time, Place, and Person)  Thought Content:  WDL  Suicidal Thoughts:  No  Homicidal Thoughts:  No  Memory:  Immediate;   Good Recent;    Good Remote;   Good  Judgement:  Fair  Insight:  Fair  Psychomotor Activity:  Normal  Concentration:  Good  Recall:  NA  Fund of Knowledge:Fair  Language: Good  Akathisia:  NA  Handed:  Right  AIMS (if indicated):     Assets:  Desire for Improvement  ADL's:  Intact  Cognition: WNL  Sleep:      Medical Decision Making: Established Problem, Stable/Improving (1)  Treatment Plan Summary: Plan discharge home  Plan:  discharge Disposition: see above  Delfin Gant    PMHNP-BC 05/19/2014 11:50 AM Patient seen face-to-face for psychiatric evaluation, chart reviewed and case discussed with the physician extender and developed treatment plan. Reviewed the information documented and agree with the treatment plan.  Corena Pilgrim, MD

## 2014-05-19 NOTE — Progress Notes (Signed)
Per psychiatrist patient psychiatrically stable for discharge home. Pt plans to follow up with [facility].   Olga CoasterKristen Rodgers Likes, LCSW  Clinical Social Work  [facility] [phone]

## 2014-05-19 NOTE — ED Notes (Signed)
Pt arrived with the following belongings: On Person: (1) [employer] [place] (2) Black pair of sweat pants. (1) Pair of Nike high top sneakers  In a Purple duffel bag:  (9) Shirts to include another football [place] blue and orange. (2) Sweat pants (2) Camouflage pants (1) stocking hat (2) pairs of underwear (1) half shelve hoodie  Personal Belongings:  (1) [employer] Cell phone (1) [employer] Cigarettes  (1) lighter (1) bottle of benadryl (1) [facility] ([facility]) [facility]

## 2014-05-19 NOTE — ED Notes (Signed)
Patient given water. Patient angry he can not have soda and food. Patient was advised he may not have anything except water between midnight and breakfast trays.

## 2014-05-19 NOTE — ED Notes (Signed)
Per patient sitter Kendal HymenBonnie was told by patient " They don't know who your messing with, I can come up here and kill everyone of you".
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
## 2014-05-19 NOTE — ED Notes (Signed)
Pt asked what the delay was.  Pt informed that he had been told about the timeline three times previously.  Pt states he needed to be told again.  Pt informed that he was waiting for the Psychiatrist to round on him in the morning.  Pt asked if he wanted his PRN trazodone and Seroquel but pt refused

## 2014-05-20 DIAGNOSIS — F250 Schizoaffective disorder, bipolar type: Secondary | ICD-10-CM

## 2014-05-20 DIAGNOSIS — F4325 Adjustment disorder with mixed disturbance of emotions and conduct: Secondary | ICD-10-CM | POA: Diagnosis not present

## 2014-05-20 MED ORDER — LORAZEPAM 2 MG/ML IJ SOLN
2.0000 mg | Freq: Once | INTRAMUSCULAR | Status: AC
  Administered 2014-05-20: 2 mg via INTRAVENOUS
  Filled 2014-05-20: qty 1

## 2014-05-20 MED ORDER — DIPHENHYDRAMINE HCL 50 MG/ML IJ SOLN
50.0000 mg | Freq: Once | INTRAMUSCULAR | Status: AC
  Administered 2014-05-20: 50 mg via INTRAVENOUS
  Filled 2014-05-20: qty 1

## 2014-05-20 MED ORDER — ACETAMINOPHEN 325 MG PO TABS: 650.0000 mg | ORAL_TABLET | Freq: Once | ORAL | Status: DC

## 2014-05-20 MED ORDER — IBUPROFEN 800 MG PO TABS: 800.0000 mg | ORAL_TABLET | Freq: Once | ORAL | Status: DC

## 2014-05-20 NOTE — [facility] Suicide Risk Assessment (Signed)
[facility]   Demographic Factors:  24 year old male  Total Time spent with patient: 45 minutes  Musculoskeletal: Strength & Muscle Tone: within normal limits Gait & Station: normal Patient leans: N/A  Psychiatric Specialty Exam: Physical Exam  ROS  Blood pressure 147/85, pulse 95, temperature 98.4 F (36.9 C), temperature source Oral, resp. rate 18, SpO2 99 %.There is no weight on file to calculate BMI.  General Appearance: Casual  Eye Contact::  Good  Speech:  Clear and Coherent409  Volume:  Normal  Mood:  Euthymic  Affect:  Appropriate  Thought Process:  Coherent and Logical  Orientation:  Full (Time, Place, and Person)  Thought Content:  Negative  Suicidal Thoughts:  No  Homicidal Thoughts:  No  Memory:  Immediate;   Good Recent;   Good Remote;   Good  Judgement:  Poor  Insight:  Lacking  Psychomotor Activity:  Normal  Concentration:  Good  Recall:  Good  Fund of Knowledge:Good  Language: Good  Akathisia:  Negative  Handed:  Right  AIMS (if indicated):     Assets:  Manufacturing systems engineerCommunication Skills Housing Physical Health Social Support Transportation  Sleep:     Cognition: WNL  ADL's:  Intact      Has this patient used any form of tobacco in the last 30 days? (Cigarettes, Smokeless Tobacco, Cigars, and/or Pipes) N/A  Mental Status Per Nursing Assessment::   On Admission:     Current Mental Status by Physician: NA  Loss Factors: NA  Historical Factors: Prior suicide attempts  Risk Reduction Factors:   Living with another person, especially a relative  Continued Clinical Symptoms:  Previous Psychiatric Diagnoses and Treatments  Cognitive Features That Contribute To Risk:  Closed-mindedness    Suicide Risk:  Minimal: No identifiable suicidal ideation.  Patients presenting with no risk factors but with morbid ruminations; may be classified as minimal risk based on the severity of the depressive symptoms  Principal Problem:  <principal problem not specified> Discharge Diagnoses:  Patient Active Problem List   Diagnosis Date Noted  . Schizoaffective disorder, bipolar type [F25.0]   . Asthma, chronic [J45.909] 03/25/2014  . Obesity, morbid [E66.01] 03/25/2014  . Drug overdose, intentional [T50.902A] 10/09/2013  . Aggression [F60.89] 08/22/2013  . Schizoaffective disorder [F25.9] 08/04/2013      Plan Of Care/Follow-up recommendations:  Activity:  resume usual activity Diet:  resume usual diet  Is patient on multiple antipsychotic therapies at discharge:  No   Has Patient had three or more failed trials of antipsychotic monotherapy by history:  No  Recommended Plan for Multiple Antipsychotic Therapies: NA    [name] D 05/20/2014, 1:39 PM

## 2014-05-20 NOTE — ED Notes (Signed)
Up in hall, ambulatory w/o difficulty

## 2014-05-20 NOTE — ED Notes (Signed)
Dr Ladona Ridgeltaylor into see

## 2014-05-20 NOTE — Progress Notes (Signed)
Per psychiatrist and NP, patient psychiatrically stable for discharge home. Pt plans to follow up with [facility] and [facility]. Pt has [facility] intake appointment on Tuesday 05/25/2014. Patient met with [facility] actt team today to meet patient in order to have some support over the weekend. Patient provided bus pass to dc home.   Belia Heman, [place] Work  [employer] [phone]

## 2014-05-20 NOTE — ED Notes (Signed)
Pt standing in the Door way "yelling I am ready to go and I need to see my psychiatrist" This RN asked the pt to please go back in his room, we can not have yelling in the hallway.   Security at the bedside to assist.

## 2014-05-20 NOTE — Consult Note (Signed)
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 Schizoaffective disorder, bipolar type [F25.0]   . Asthma, chronic [J45.909] 03/25/2014  . Obesity, morbid [E66.01] 03/25/2014  . Drug overdose, intentional [T50.902A] 10/09/2013  . Aggression [F60.89] 08/22/2013  . Schizoaffective disorder [F25.9] 08/04/2013    Total Time spent with patient: 45 minutes  Subjective:   [name] is a 24 y.o. male patient admitted with new complaints including saying he took an overdose and is suicidal and homicidal.  HPI:  [name] is very well known to the ED and to me. He comes in frequently sometimes every day and even more than once on the same day. His stories are preposterous at times saying he is hearing voices and seeing bizarre things.  He seems to like being in the ED and seeing how people will react to the various things he says.  I no longer believe what he says.  By thr time I saw him today he has retracted his story and is demanding he be discharged.  This does not mean that he will not be back this afternoon with a different story.  Currently he denies any suicidal or homicidal or psychotic thoughts. HPI Elements:   Location:  making claims that we do not believe any longer. Quality:  retracts his story of taking an overdose and suicidal and homicidal thoughts. Severity:  not serious. Timing:  no precipitants. Duration:  years. Context:  as above.  Past Medical History:  Past Medical History  Diagnosis Date  . Asthma   . Bipolar 1 disorder   . ODD (oppositional defiant disorder)   . ADHD (attention deficit hyperactivity disorder)   . Schizophrenia   .  Suicidal ideation   . Homicidal ideation   . Explosive personality disorder    No past surgical history on file. Family History:  Family History  Problem Relation Age of Onset  . Asthma Mother   . Asthma Sister   . Thyroid disease     Social History:  History  Alcohol Use No    Comment: sometimes     History  Drug Use No    Comment: Patient denies     History   Social History  . Marital Status: Single    Spouse Name: N/A  . Number of Children: N/A  . Years of Education: N/A   Social History Main Topics  . Smoking status: Never Smoker   . Smokeless tobacco: Not on file  . Alcohol Use: No     Comment: sometimes  . Drug Use: No     Comment: Patient denies   . Sexual Activity: Not on file   Other Topics Concern  . Not on file   Social History Narrative   Additional Social History:    Pain Medications: SEE MAR Prescriptions: SEE MAR; "The only thing I know that I take is Abilify.Marland KitchenMarland KitchenI don't know the names or dosages" Over the Counter: SEE MAR History of alcohol / drug use?: Yes Longest period of sobriety (when/how long): "don't know" Negative Consequences of Use: Legal, Work / Youth worker, Personal relationships, Financial Name of Substance 1: Alcohol [name], [employer] Vodka, Colt 55) 1 - Age of First Use: 23 1 - Amount (size/oz): "  I drink 1/2 of the bottles" 1 - Frequency: "I am social drinker or stress drinker" 1 - Duration: since age 18 1 - Last Use / Amount: Tonight (05/19/14) -"half a bottle of alcohol" (unsure of type)                   Allergies:   Allergies  Allergen Reactions  . Carbamazepine Anaphylaxis, Other (See Comments) and Cough    Cough up blood  . Geodon [Ziprasidone Hcl] Anaphylaxis and Other (See Comments)    " Causes my throat to close"  . Haldol [Haloperidol Lactate] Anaphylaxis and Other (See Comments)    Patient reports that he stopped taking this because of throat swelling.   Christianne Borrow [Atomoxetine] Anaphylaxis, Other (See  Comments) and Cough    Cough up blood  . Other Nausea Only and Other (See Comments)    Apple Juice, stomach hurts and itchy throat   . Shellfish Allergy Swelling    Throat swelling    Labs:  Results for orders placed or performed during the hospital encounter of 05/19/14 (from the past 48 hour(s))  CBC     Status: None   Collection Time: 05/19/14  8:40 PM  Result Value Ref Range   WBC 5.7 4.0 - 10.5 K/uL   RBC 5.44 4.22 - 5.81 MIL/uL   Hemoglobin 14.7 13.0 - 17.0 g/dL   HCT 43.9 39.0 - 52.0 %   MCV 80.7 78.0 - 100.0 fL   MCH 27.0 26.0 - 34.0 pg   MCHC 33.5 30.0 - 36.0 g/dL   RDW 13.7 11.5 - 15.5 %   Platelets 338 150 - 400 K/uL  Comprehensive metabolic panel     Status: None   Collection Time: 05/19/14  8:40 PM  Result Value Ref Range   Sodium 138 135 - 145 mmol/L   Potassium 4.0 3.5 - 5.1 mmol/L   Chloride 103 96 - 112 mmol/L   CO2 26 19 - 32 mmol/L   Glucose, Bld 87 70 - 99 mg/dL   BUN 7 6 - 23 mg/dL   Creatinine, Ser 1.02 0.50 - 1.35 mg/dL   Calcium 9.1 8.4 - 10.5 mg/dL   Total Protein 8.1 6.0 - 8.3 g/dL   Albumin 4.7 3.5 - 5.2 g/dL   AST 27 0 - 37 U/L   ALT 28 0 - 53 U/L   Alkaline Phosphatase 79 39 - 117 U/L   Total Bilirubin 0.5 0.3 - 1.2 mg/dL   GFR calc non Af Amer >90 >90 mL/min   GFR calc Af Amer >90 >90 mL/min    Comment: (NOTE) The eGFR has been calculated using the CKD EPI equation. This calculation has not been validated in all clinical situations. eGFR's persistently <90 mL/min signify possible Chronic Kidney Disease.    Anion gap 9 5 - 15  Ethanol (ETOH)     Status: None   Collection Time: 05/19/14  8:40 PM  Result Value Ref Range   Alcohol, Ethyl (B) <5 0 - 9 mg/dL    Comment:        LOWEST DETECTABLE LIMIT FOR SERUM ALCOHOL IS 11 mg/dL FOR MEDICAL PURPOSES ONLY   Acetaminophen level     Status: Abnormal   Collection Time: 05/19/14  8:40 PM  Result Value Ref Range   Acetaminophen (Tylenol), Serum <10.0 (L) 10 - 30 ug/mL    Comment:         THERAPEUTIC CONCENTRATIONS VARY SIGNIFICANTLY. A RANGE OF 10-30 ug/mL MAY BE AN  EFFECTIVE CONCENTRATION FOR MANY PATIENTS. HOWEVER, SOME ARE BEST TREATED AT CONCENTRATIONS OUTSIDE THIS RANGE. ACETAMINOPHEN CONCENTRATIONS >150 ug/mL AT 4 HOURS AFTER INGESTION AND >50 ug/mL AT 12 HOURS AFTER INGESTION ARE OFTEN ASSOCIATED WITH TOXIC REACTIONS.   Salicylate level     Status: None   Collection Time: 05/19/14  8:40 PM  Result Value Ref Range   Salicylate Lvl <0.9 2.8 - 20.0 mg/dL  Urine rapid drug screen (hosp performed)     Status: Abnormal   Collection Time: 05/19/14  8:59 PM  Result Value Ref Range   Opiates NONE DETECTED NONE DETECTED   Cocaine NONE DETECTED NONE DETECTED   Benzodiazepines POSITIVE (A) NONE DETECTED   Amphetamines NONE DETECTED NONE DETECTED   Tetrahydrocannabinol NONE DETECTED NONE DETECTED   Barbiturates NONE DETECTED NONE DETECTED    Comment:        DRUG SCREEN FOR MEDICAL PURPOSES ONLY.  IF CONFIRMATION IS NEEDED FOR ANY PURPOSE, NOTIFY LAB WITHIN 5 DAYS.        LOWEST DETECTABLE LIMITS FOR URINE DRUG SCREEN Drug Class       Cutoff (ng/mL) Amphetamine      1000 Barbiturate      200 Benzodiazepine   735 Tricyclics       329 Opiates          300 Cocaine          300 THC              50     Vitals: Blood pressure 147/85, pulse 95, temperature 98.4 F (36.9 C), temperature source Oral, resp. rate 18, SpO2 99 %.  Risk to Self: Suicidal Ideation: Yes-Currently Present Suicidal Intent: Yes-Currently Present Is patient at risk for suicide?: Yes Suicidal Plan?: Yes-Currently Present Specify Current Suicidal Plan: Overdose Access to Means: Yes Specify Access to Suicidal Means: Medications What has been your use of drugs/alcohol within the last 12 months?: Etoh use How many times?:  (Multiple since age 54) Other Self Harm Risks: Hx of cutting Triggers for Past Attempts: Unpredictable Intentional Self Injurious Behavior: Cutting Comment - Self  Injurious Behavior: Cuts on arms. Reports he last cut 2 months ago. Risk to Others: Homicidal Ideation: Yes-Currently Present Thoughts of Harm to Others: Yes-Currently Present Comment - Thoughts of Harm to Others: Pt would not go into detail Current Homicidal Intent: No Current Homicidal Plan: Yes-Currently Present Describe Current Homicidal Plan: Pt would not give details Access to Homicidal Means: No (UTA) Describe Access to Homicidal Means: UTA Identified Victim: UTA History of harm to others?: Yes Assessment of Violence: None Noted Violent Behavior Description: None noted but pt reports hx of violence Does patient have access to weapons?: No (but he says he could find a gun if he "ever wanted to") Criminal Charges Pending?: No Does patient have a court date: No Prior Inpatient Therapy: Prior Inpatient Therapy: Yes Prior Therapy Dates: Multiple since age 4 Prior Therapy Facilty/Provider(s): [facility], [facility], [facility] Reason for Treatment: SI, psychosis, aggression Prior Outpatient Therapy: Prior Outpatient Therapy: Yes Prior Therapy Dates: Ongoing Prior [facility]ilty[facility] Reason for Treatment: Med mgmt / [facility]  Current Facility-Administered Medications  Medication Dose Route Frequency Provider Last Rate Last Dose  . acetaminophen (TYLENOL) tablet 650 mg  650 mg Oral Once Blanchie Dessert, MD      . albuterol (PROVENTIL HFA;VENTOLIN HFA) 108 (90 BASE) MCG/ACT inhaler 1 puff  1 puff Inhalation Q6H PRN Lacretia Leigh, MD      . asenapine (SAPHRIS) sublingual tablet  5 mg  5 mg Sublingual BID Lacretia Leigh, MD   5 mg at 05/20/14 1120  . fluvoxaMINE (LUVOX) tablet 50 mg  50 mg Oral QHS Lacretia Leigh, MD   50 mg at 05/19/14 2301  . ibuprofen (ADVIL,MOTRIN) tablet 800 mg  800 mg Oral Once Blanchie Dessert, MD      . QUEtiapine (SEROQUEL) tablet 100 mg  100 mg Oral BID PRN Lacretia Leigh, MD      . traZODone (DESYREL) tablet 50 mg  50 mg Oral QHS PRN Lacretia Leigh, MD       Current Outpatient Prescriptions  Medication Sig Dispense Refill  . albuterol (PROVENTIL HFA;VENTOLIN HFA) 108 (90 BASE) MCG/ACT inhaler Inhale 1 puff into the lungs every 6 (six) hours as needed for wheezing or shortness of breath. 18 g 3  . asenapine (SAPHRIS) 5 MG SUBL 24 hr tablet Place 5 mg under the tongue 2 (two) times daily.    Marland Kitchen EPINEPHrine (EPIPEN 2-PAK IJ) Inject as directed.    . fluticasone (FLOVENT HFA) 110 MCG/ACT inhaler Inhale 2 puffs into the lungs every 12 (twelve) hours. 1 Inhaler 12  . fluvoxaMINE (LUVOX) 50 MG tablet Take 50 mg by mouth at bedtime.    Marland Kitchen QUEtiapine (SEROQUEL) 100 MG tablet Take 100 mg by mouth 2 (two) times daily as needed (for mood).    . traZODone (DESYREL) 50 MG tablet Take 50 mg by mouth at bedtime as needed for sleep.    . benztropine (COGENTIN) 1 MG tablet Take 1 tablet (1 mg total) by mouth 2 (two) times daily. (Patient not taking: Reported on 05/18/2014) 60 tablet 0  . Vitamin D, Ergocalciferol, (DRISDOL) 50000 UNITS CAPS capsule Take 1 capsule (50,000 Units total) by mouth every 7 (seven) days. (Patient not taking: Reported on 05/18/2014) 12 capsule 0    Musculoskeletal: Strength & Muscle Tone: within normal limits Gait & Station: normal Patient leans: N/A  Psychiatric Specialty Exam: Physical Exam  ROS  Blood pressure 147/85, pulse 95, temperature 98.4 F (36.9 C), temperature source Oral, resp. rate 18, SpO2 99 %.There is no weight on file to calculate BMI.  General Appearance: Casual  Eye Contact::  Good  Speech:  Clear and Coherent  Volume:  Normal  Mood:  Euthymic  Affect:  Appropriate  Thought Process:  Coherent and Logical  Orientation:  Full (Time, Place, and Person)  Thought Content:  Negative  Suicidal Thoughts:  No  Homicidal Thoughts:  No  Memory:  Immediate;   Good Recent;   Good Remote;   Good  Judgement:  Fair  Insight:  Lacking  Psychomotor Activity:  Normal  Concentration:  Good  Recall:  Good   Fund of Knowledge:Good  Language: Good  Akathisia:  Negative  Handed:  Right  AIMS (if indicated):     Assets:  Chief Executive Officer Physical Health Social Support Transportation  ADL's:  Intact  Cognition: WNL  Sleep:      Medical Decision Making: Established Problem, Stable/Improving (1)  Treatment Plan Summary: discharge home with follow up evaluation with [facility] ACT team  Plan:  No evidence of imminent risk to self or others at present.   Disposition: discharge  Ayomide Purdy D 05/20/2014 1:19 PM

## 2014-05-20 NOTE — BHH Counselor (Signed)
TTS counselor reviewed pt chart and spoke with EDP, Dr Freida BusmanAllen, in order to gain collateral info [facility].  Assessment to begin shortly.

## 2014-05-20 NOTE — ED Notes (Addendum)
Written dc instructions reviewed w/ pt.  Pt encouraged to take his medications as directed,  Keep his appt this Tuesday w/ [facility], and return/seek treatment for suicidal/homicidal thoughts or urges.  Pt verbalized understanding.  On dc pt alert/oriented x4, denies si/hi/avh. Pt ambulatory w/o difficulty to dc window w/ mHt, belongings returned after leaving the unit.

## 2014-05-20 NOTE — Progress Notes (Signed)
[facility] actt team coming to talk with pt. Pt to complete official intake of services on Tuesday 05/25/14  Olga CoasterKristen Otilia Kareem, LCSW  Clinical Social Work  [facility] [phone]

## 2014-05-20 NOTE — ED Notes (Addendum)
Up walking in the hall w/o difficulty, nad.

## 2014-05-20 NOTE — ED Notes (Signed)
TTS at bedside. 

## 2014-05-20 NOTE — ED Notes (Signed)
Communication with [facility] Follow Up. Case to be Closed

## 2014-05-20 NOTE — ED Notes (Signed)
Pt asking for cell phone. Pt told that he cannot use his cell phone but that he is welcome to use hospital phone. RN offered to get phone numbers from cell phone for pt and pt refused. Pt is angry that he has to wait on the ACT team and is shutting himself in the room. Security at bedside.

## 2014-05-20 NOTE — ED Notes (Signed)
Pt standing at the door asking for Coke. This RN states she will check with the EDPs to make sure he has a diet.

## 2014-05-20 NOTE — ED Notes (Addendum)
Talking w/ CSW, pt is aware that the [facility] ACTT will be into see today. Pt declined pain medication.

## 2014-05-20 NOTE — ED Notes (Signed)
Up walking in th ehall, singing, nad

## 2014-05-20 NOTE — ED Notes (Addendum)
Up to the bathroom to shower and change scrubs.  Pt to be dc'd home per Dr Ladona Ridgelaylor

## 2014-05-20 NOTE — ED Notes (Signed)
Per charge nurse Jennifer-medications (ativan/benadryl) were given IM not IV.

## 2014-05-20 NOTE — ED Notes (Signed)
[facility]

## 2014-05-20 NOTE — [facility] Counselor (Signed)
Per Donell SievertSpencer Simon, PA, AM psych eval recommended. Suspected malingering. Plan for discharge in the AM, pending psych evaluation. Follow up with ACT Team [facility], [phone]) upon d/c.  This is the 3rd night that pt has come to the ED following reported suicide attempt. The past 2 times, the pt was d/c following a psych evaluation after pt no longer endorsed any SI/HI.    Cyndie MullAnna Ikran Patman, [facility] Triage Specialist

## 2014-05-21 ENCOUNTER — Encounter (HOSPITAL_COMMUNITY): Payer: Self-pay | Admitting: Emergency Medicine

## 2014-05-21 ENCOUNTER — Encounter (HOSPITAL_COMMUNITY): Payer: Self-pay | Admitting: *Deleted

## 2014-05-21 ENCOUNTER — Emergency Department (HOSPITAL_COMMUNITY)
Admission: EM | Admit: 2014-05-21 | Discharge: 2014-05-21 | Disposition: A | Discharge status: Home / Self Care | Payer: Medicaid Other | Attending: Emergency Medicine | Admitting: Emergency Medicine

## 2014-05-21 ENCOUNTER — Emergency Department (EMERGENCY_DEPARTMENT_HOSPITAL)
Admission: EM | Admit: 2014-05-21 | Discharge: 2014-05-21 | Disposition: A | Discharge status: Home / Self Care | Payer: Medicaid Other | Source: Home / Self Care | Attending: Emergency Medicine | Admitting: Emergency Medicine

## 2014-05-21 DIAGNOSIS — Y998 Other external cause status: Secondary | ICD-10-CM | POA: Insufficient documentation

## 2014-05-21 DIAGNOSIS — Y9289 Other specified places as the place of occurrence of the external cause: Secondary | ICD-10-CM

## 2014-05-21 DIAGNOSIS — Y9389 Activity, other specified: Secondary | ICD-10-CM | POA: Insufficient documentation

## 2014-05-21 DIAGNOSIS — F250 Schizoaffective disorder, bipolar type: Secondary | ICD-10-CM | POA: Insufficient documentation

## 2014-05-21 DIAGNOSIS — Z79899 Other long term (current) drug therapy: Secondary | ICD-10-CM | POA: Insufficient documentation

## 2014-05-21 DIAGNOSIS — F909 Attention-deficit hyperactivity disorder, unspecified type: Secondary | ICD-10-CM | POA: Insufficient documentation

## 2014-05-21 DIAGNOSIS — T39312A Poisoning by propionic acid derivatives, intentional self-harm, initial encounter: Secondary | ICD-10-CM | POA: Insufficient documentation

## 2014-05-21 DIAGNOSIS — R45851 Suicidal ideations: Secondary | ICD-10-CM | POA: Diagnosis not present

## 2014-05-21 DIAGNOSIS — T450X2A Poisoning by antiallergic and antiemetic drugs, intentional self-harm, initial encounter: Secondary | ICD-10-CM

## 2014-05-21 DIAGNOSIS — T400X1A Poisoning by opium, accidental (unintentional), initial encounter: Secondary | ICD-10-CM | POA: Diagnosis present

## 2014-05-21 DIAGNOSIS — F259 Schizoaffective disorder, unspecified: Secondary | ICD-10-CM | POA: Diagnosis present

## 2014-05-21 DIAGNOSIS — T44992A Poisoning by other drug primarily affecting the autonomic nervous system, intentional self-harm, initial encounter: Secondary | ICD-10-CM

## 2014-05-21 DIAGNOSIS — F1310 Sedative, hypnotic or anxiolytic abuse, uncomplicated: Secondary | ICD-10-CM | POA: Insufficient documentation

## 2014-05-21 DIAGNOSIS — T483X2A Poisoning by antitussives, intentional self-harm, initial encounter: Secondary | ICD-10-CM | POA: Insufficient documentation

## 2014-05-21 DIAGNOSIS — J45909 Unspecified asthma, uncomplicated: Secondary | ICD-10-CM

## 2014-05-21 DIAGNOSIS — X58XXXA Exposure to other specified factors, initial encounter: Secondary | ICD-10-CM | POA: Diagnosis not present

## 2014-05-21 DIAGNOSIS — F319 Bipolar disorder, unspecified: Secondary | ICD-10-CM

## 2014-05-21 DIAGNOSIS — Z7951 Long term (current) use of inhaled steroids: Secondary | ICD-10-CM | POA: Diagnosis not present

## 2014-05-21 LAB — ETHANOL: Alcohol, Ethyl (B): <5 mg/dL (ref 0–9)

## 2014-05-21 LAB — CBC WITH DIFFERENTIAL/PLATELET
BASOS PCT: 1 % (ref 0–1)
Basophils Absolute: 0 10*3/uL (ref 0.0–0.1)
Basophils Absolute: 0 10*3/uL (ref 0.0–0.1)
Basophils Relative: 0 % (ref 0–1)
EOS ABS: 0.2 10*3/uL (ref 0.0–0.7)
EOS PCT: 4 % (ref 0–5)
Eosinophils Absolute: 0.3 10*3/uL (ref 0.0–0.7)
Eosinophils Relative: 5 % (ref 0–5)
HCT: 43.3 % (ref 39.0–52.0)
HCT: 42.9 % (ref 39.0–52.0)
Hemoglobin: 14.6 g/dL (ref 13.0–17.0)
Hemoglobin: 14.4 g/dL (ref 13.0–17.0)
Lymphocytes Relative: 36 % (ref 12–46)
Lymphocytes Relative: 31 % (ref 12–46)
Lymphs Abs: 2.2 10*3/uL (ref 0.7–4.0)
Lymphs Abs: 1.6 10*3/uL (ref 0.7–4.0)
MCH: 27.2 pg (ref 26.0–34.0)
MCH: 27.1 pg (ref 26.0–34.0)
MCHC: 33.7 g/dL (ref 30.0–36.0)
MCHC: 33.6 g/dL (ref 30.0–36.0)
MCV: 80.6 fL (ref 78.0–100.0)
MCV: 80.8 fL (ref 78.0–100.0)
MONO ABS: 0.5 10*3/uL (ref 0.1–1.0)
MONO ABS: 0.5 10*3/uL (ref 0.1–1.0)
Monocytes Relative: 8 % (ref 3–12)
Monocytes Relative: 10 % (ref 3–12)
NEUTROS PCT: 52 % (ref 43–77)
Neutro Abs: 3.1 10*3/uL (ref 1.7–7.7)
Neutro Abs: 2.8 10*3/uL (ref 1.7–7.7)
Neutrophils Relative %: 53 % (ref 43–77)
PLATELETS: 335 10*3/uL (ref 150–400)
Platelets: 327 10*3/uL (ref 150–400)
RBC: 5.37 MIL/uL (ref 4.22–5.81)
RBC: 5.31 MIL/uL (ref 4.22–5.81)
RDW: 13.7 % (ref 11.5–15.5)
RDW: 13.7 % (ref 11.5–15.5)
WBC: 6 10*3/uL (ref 4.0–10.5)
WBC: 5.3 10*3/uL (ref 4.0–10.5)

## 2014-05-21 LAB — I-STAT CHEM 8, ED
BUN: 6 mg/dL (ref 6–23)
CREATININE: 1 mg/dL (ref 0.50–1.35)
Calcium, Ion: 1.18 mmol/L (ref 1.12–1.23)
Chloride: 100 mmol/L (ref 96–112)
Glucose, Bld: 82 mg/dL (ref 70–99)
HEMATOCRIT: 48 % (ref 39.0–52.0)
HEMOGLOBIN: 16.3 g/dL (ref 13.0–17.0)
POTASSIUM: 4 mmol/L (ref 3.5–5.1)
SODIUM: 141 mmol/L (ref 135–145)
TCO2: 25 mmol/L (ref 0–100)

## 2014-05-21 LAB — MAGNESIUM: Magnesium: 2 mg/dL (ref 1.5–2.5)

## 2014-05-21 LAB — COMPREHENSIVE METABOLIC PANEL
ALK PHOS: 73 U/L (ref 39–117)
ALT: 24 U/L (ref 0–53)
AST: 25 U/L (ref 0–37)
Albumin: 4.5 g/dL (ref 3.5–5.2)
Anion gap: 9 (ref 5–15)
BILIRUBIN TOTAL: 0.6 mg/dL (ref 0.3–1.2)
BUN: 10 mg/dL (ref 6–23)
CALCIUM: 9.1 mg/dL (ref 8.4–10.5)
CO2: 24 mmol/L (ref 19–32)
Chloride: 105 mmol/L (ref 96–112)
Creatinine, Ser: 0.92 mg/dL (ref 0.50–1.35)
GFR calc Af Amer: >90 mL/min (ref >90)
GLUCOSE: 100 mg/dL — AB (ref 70–99)
POTASSIUM: 4.1 mmol/L (ref 3.5–5.1)
SODIUM: 138 mmol/L (ref 135–145)
Total Protein: 7.7 g/dL (ref 6.0–8.3)

## 2014-05-21 LAB — RAPID URINE DRUG SCREEN, HOSP PERFORMED
AMPHETAMINES: NOT DETECTED
Amphetamines: NOT DETECTED
BARBITURATES: NOT DETECTED
BENZODIAZEPINES: POSITIVE — AB
Barbiturates: NOT DETECTED
Benzodiazepines: POSITIVE — AB
COCAINE: NOT DETECTED
Cocaine: NOT DETECTED
Opiates: NOT DETECTED
Opiates: NOT DETECTED
TETRAHYDROCANNABINOL: NOT DETECTED
Tetrahydrocannabinol: NOT DETECTED

## 2014-05-21 LAB — ACETAMINOPHEN LEVEL
Acetaminophen (Tylenol), Serum: <10 ug/mL — ABNORMAL LOW (ref 10–30)
Acetaminophen (Tylenol), Serum: <10 ug/mL — ABNORMAL LOW (ref 10–30)

## 2014-05-21 LAB — SALICYLATE LEVEL: Salicylate Lvl: <4 mg/dL (ref 2.8–20.0)

## 2014-05-21 NOTE — BH Assessment (Signed)
[facility] Assessment Progress Note  Per Carolanne GrumblingGerald Taylor, MD this pt does not require psychiatric hospitalization at this time.  [facility]  Pt has an intake appointment with [facility] on Tuesday, 05/25/2014.  Discharge instructions advise him to keep this appointment.  [facility].  Pt's nurse, Marylu LundJanet, has been notified.  Doylene Canninghomas Jurell Basista, MA Triage Specialist 05/21/2014 @ 11:15

## 2014-05-21 NOTE — ED Notes (Addendum)
Per EMS, Patient called 911 stating he had taken 60-1mg  tablets clonazepam and 50-25mg  tabs benadryl. Patient states he was also using ETOH tonight (Colt 45). Patient endorses to EMS that he took the pills to kill himself, then stated he took the pills because the voices told him to. Patient was standing on the side of the road when EMS arrived. Patient does appear sligthly drowsy.

## 2014-05-21 NOTE — Consult Note (Signed)
[facility] Consult   Reason for Consult:  Overdose Referring Physician:  EDP Patient Identification: [name] MRN:  [id number] Principal Diagnosis:  Schizoaffective Disorder Diagnosis:   Patient Active Problem List   Diagnosis Date Noted  . Schizoaffective disorder, bipolar type [F25.0]     Priority: High  . Drug overdose, intentional [T50.902A] 10/09/2013    Priority: High  . Aggression [F60.89] 08/22/2013    Priority: High  . Asthma, chronic [J45.909] 03/25/2014  . Obesity, morbid [E66.01] 03/25/2014    Total Time spent with patient: 45 minutes  Subjective:   [name] is a 24 y.o. male patient does not warrant admission.  HPI:  Patient took Benadryl and Dayquil earlier after discharging from the ED.  He had been demanding to go to [facility] but it was explained to him that this was not an option.  This patient is well-known to staff and providers as he frequents the ED with similar issues.  He enjoys the socialization and is now saying he feels "invigorated", denies suicidal/homicidal ideations, hallucinations, and alcohol/drug issues.  [name] lives with his mother and has an ACT team, [facility], that will be full-time next week with him to curve his ED trips.   HPI Elements:   Location:  generalized. Quality:  acute. Severity:  mild. Timing:  intemittent. Duration:  brief. Context:  stressors.  Past Medical History:  Past Medical History  Diagnosis Date  . Asthma   . Bipolar 1 disorder   . ODD (oppositional defiant disorder)   . ADHD (attention deficit hyperactivity disorder)   . Schizophrenia   . Suicidal ideation   . Homicidal ideation   . Explosive personality disorder    History reviewed. No pertinent past surgical history. Family History:  Family History  Problem Relation Age of Onset  . Asthma Mother   . Asthma Sister   . Thyroid disease     Social History:  History  Alcohol Use No    Comment: sometimes     History  Drug Use No   Comment: Patient denies     History   Social History  . Marital Status: Single    Spouse Name: N/A  . Number of Children: N/A  . Years of Education: N/A   Social History Main Topics  . Smoking status: Never Smoker   . Smokeless tobacco: Not on file  . Alcohol Use: No     Comment: sometimes  . Drug Use: No     Comment: Patient denies   . Sexual Activity: Not on file   Other Topics Concern  . None   Social History Narrative   Additional Social History:                          Allergies:   Allergies  Allergen Reactions  . Carbamazepine Anaphylaxis, Other (See Comments) and Cough    Cough up blood  . Geodon [Ziprasidone Hcl] Anaphylaxis and Other (See Comments)    " Causes my throat to close"  . Haldol [Haloperidol Lactate] Anaphylaxis and Other (See Comments)    Patient reports that he stopped taking this because of throat swelling.   Christianne Borrow [Atomoxetine] Anaphylaxis, Other (See Comments) and Cough    Cough up blood  . Other Nausea Only and Other (See Comments)    Apple Juice, stomach hurts and itchy throat   . Shellfish Allergy Swelling    Throat swelling    Labs:  Results for orders placed  or performed during the hospital encounter of 05/21/14 (from the past 48 hour(s))  Drug screen panel, emergency     Status: Abnormal   Collection Time: 05/21/14  5:00 PM  Result Value Ref Range   Opiates NONE DETECTED NONE DETECTED   Cocaine NONE DETECTED NONE DETECTED   Benzodiazepines POSITIVE (A) NONE DETECTED   Amphetamines NONE DETECTED NONE DETECTED   Tetrahydrocannabinol NONE DETECTED NONE DETECTED   Barbiturates NONE DETECTED NONE DETECTED    Comment:        DRUG SCREEN FOR MEDICAL PURPOSES ONLY.  IF CONFIRMATION IS NEEDED FOR ANY PURPOSE, NOTIFY LAB WITHIN 5 DAYS.        LOWEST DETECTABLE LIMITS FOR URINE DRUG SCREEN Drug Class       Cutoff (ng/mL) Amphetamine      1000 Barbiturate      200 Benzodiazepine   626 Tricyclics       948 Opiates           300 Cocaine          300 THC              50   CBC WITH DIFFERENTIAL     Status: None   Collection Time: 05/21/14  5:20 PM  Result Value Ref Range   WBC 5.3 4.0 - 10.5 K/uL   RBC 5.31 4.22 - 5.81 MIL/uL   Hemoglobin 14.4 13.0 - 17.0 g/dL   HCT 42.9 39.0 - 52.0 %   MCV 80.8 78.0 - 100.0 fL   MCH 27.1 26.0 - 34.0 pg   MCHC 33.6 30.0 - 36.0 g/dL   RDW 13.7 11.5 - 15.5 %   Platelets 327 150 - 400 K/uL   Neutrophils Relative % 53 43 - 77 %   Neutro Abs 2.8 1.7 - 7.7 K/uL   Lymphocytes Relative 31 12 - 46 %   Lymphs Abs 1.6 0.7 - 4.0 K/uL   Monocytes Relative 10 3 - 12 %   Monocytes Absolute 0.5 0.1 - 1.0 K/uL   Eosinophils Relative 5 0 - 5 %   Eosinophils Absolute 0.3 0.0 - 0.7 K/uL   Basophils Relative 1 0 - 1 %   Basophils Absolute 0.0 0.0 - 0.1 K/uL  Comprehensive metabolic panel     Status: Abnormal   Collection Time: 05/21/14  5:20 PM  Result Value Ref Range   Sodium 138 135 - 145 mmol/L   Potassium 4.1 3.5 - 5.1 mmol/L   Chloride 105 96 - 112 mmol/L   CO2 24 19 - 32 mmol/L   Glucose, Bld 100 (H) 70 - 99 mg/dL   BUN 10 6 - 23 mg/dL   Creatinine, Ser 0.92 0.50 - 1.35 mg/dL   Calcium 9.1 8.4 - 10.5 mg/dL   Total Protein 7.7 6.0 - 8.3 g/dL   Albumin 4.5 3.5 - 5.2 g/dL   AST 25 0 - 37 U/L   ALT 24 0 - 53 U/L   Alkaline Phosphatase 73 39 - 117 U/L   Total Bilirubin 0.6 0.3 - 1.2 mg/dL   GFR calc non Af Amer >90 >90 mL/min   GFR calc Af Amer >90 >90 mL/min    Comment: (NOTE) The eGFR has been calculated using the CKD EPI equation. This calculation has not been validated in all clinical situations. eGFR's persistently <90 mL/min signify possible Chronic Kidney Disease.    Anion gap 9 5 - 15  Ethanol     Status: None   Collection Time: 05/21/14  5:22 PM  Result Value Ref Range   Alcohol, Ethyl (B) <5 0 - 9 mg/dL    Comment:        LOWEST DETECTABLE LIMIT FOR SERUM ALCOHOL IS 11 mg/dL FOR MEDICAL PURPOSES ONLY   Acetaminophen level     Status: Abnormal    Collection Time: 05/21/14  5:22 PM  Result Value Ref Range   Acetaminophen (Tylenol), Serum <10.0 (L) 10 - 30 ug/mL    Comment:        THERAPEUTIC CONCENTRATIONS VARY SIGNIFICANTLY. A RANGE OF 10-30 ug/mL MAY BE AN EFFECTIVE CONCENTRATION FOR MANY PATIENTS. HOWEVER, SOME ARE BEST TREATED AT CONCENTRATIONS OUTSIDE THIS RANGE. ACETAMINOPHEN CONCENTRATIONS >150 ug/mL AT 4 HOURS AFTER INGESTION AND >50 ug/mL AT 12 HOURS AFTER INGESTION ARE OFTEN ASSOCIATED WITH TOXIC REACTIONS.   Salicylate level     Status: None   Collection Time: 05/21/14  5:22 PM  Result Value Ref Range   Salicylate Lvl <3.9 2.8 - 20.0 mg/dL    Vitals: Blood pressure 128/66, pulse 102, temperature 98.7 F (37.1 C), temperature source Oral, SpO2 96 %.  Risk to Self:   Risk to Others:   Prior Inpatient Therapy:   Prior Outpatient Therapy:    No current facility-administered medications for this encounter.   Current Outpatient Prescriptions  Medication Sig Dispense Refill  . albuterol (PROVENTIL HFA;VENTOLIN HFA) 108 (90 BASE) MCG/ACT inhaler Inhale 1 puff into the lungs every 6 (six) hours as needed for wheezing or shortness of breath. 18 g 3  . asenapine (SAPHRIS) 5 MG SUBL 24 hr tablet Place 5 mg under the tongue 2 (two) times daily.    . benztropine (COGENTIN) 1 MG tablet Take 1 tablet (1 mg total) by mouth 2 (two) times daily. (Patient not taking: Reported on 05/18/2014) 60 tablet 0  . EPINEPHrine (EPIPEN 2-PAK IJ) Inject as directed.    . fluticasone (FLOVENT HFA) 110 MCG/ACT inhaler Inhale 2 puffs into the lungs every 12 (twelve) hours. 1 Inhaler 12  . fluvoxaMINE (LUVOX) 50 MG tablet Take 50 mg by mouth at bedtime.    Marland Kitchen QUEtiapine (SEROQUEL) 100 MG tablet Take 100 mg by mouth 2 (two) times daily as needed (for mood).    . traZODone (DESYREL) 50 MG tablet Take 50 mg by mouth at bedtime as needed for sleep.    . Vitamin D, Ergocalciferol, (DRISDOL) 50000 UNITS CAPS capsule Take 1 capsule (50,000 Units  total) by mouth every 7 (seven) days. (Patient not taking: Reported on 05/18/2014) 12 capsule 0    Musculoskeletal: Strength & Muscle Tone: within normal limits Gait & Station: normal Patient leans: N/A  Psychiatric Specialty Exam:     Blood pressure 128/66, pulse 102, temperature 98.7 F (37.1 C), temperature source Oral, SpO2 96 %.There is no weight on file to calculate BMI.  General Appearance: Casual  Eye Contact::  Good  Speech:  Normal Rate  Volume:  Normal  Mood:  Euthymic  Affect:  Congruent  Thought Process:  Coherent  Orientation:  Full (Time, Place, and Person)  Thought Content:  WDL  Suicidal Thoughts:  No  Homicidal Thoughts:  No  Memory:  Immediate;   Good Recent;   Good Remote;   Good  Judgement:  Fair  Insight:  Fair  Psychomotor Activity:  Normal  Concentration:  Good  Recall:  Good  Fund of Knowledge:Good  Language: Good  Akathisia:  No  Handed:  Right  AIMS (if indicated):     Assets:  Housing Leisure Time  Physical Health Resilience Social Support  ADL's:  Intact  Cognition: WNL  Sleep:      Medical Decision Making: Review of Psycho-Social Stressors (1), Review or order clinical lab tests (1) and Review of Medication Regimen & Side Effects (2)  Treatment Plan Summary: Daily contact with patient to assess and evaluate symptoms and progress in treatment, Medication management and Plan discharge home and follow-up with his ACT team  Plan:  No evidence of imminent risk to self or others at present.   Disposition: Discharge  Waylan Boga, [place] 05/21/2014 6:48 PM

## 2014-05-21 NOTE — ED Provider Notes (Signed)
CSN: [id number]     Arrival date & time 05/21/14  1611 History   First MD Initiated Contact with Patient 05/21/14 1627     Chief Complaint  Patient presents with  . Ingestion     (Consider location/radiation/quality/duration/timing/severity/associated sxs/prior Treatment) Patient is a 24 y.o. male presenting with mental health disorder.  Mental Health Problem Presenting symptoms: hallucinations (chronically), homicidal ideas, suicidal thoughts, suicidal threats and suicide attempt (advil bottle, dayquil unknown amount)   Patient accompanied by:  Law enforcement Degree of incapacity (severity):  Severe Onset quality:  Gradual Timing:  Constant Progression:  Unchanged Chronicity:  Chronic Context: alcohol use and noncompliance   Treatment compliance:  Untreated Worsened by:  Nothing tried Ineffective treatments:  None tried Associated symptoms: no abdominal pain and no psychomotor retardation     Past Medical History  Diagnosis Date  . Asthma   . Bipolar 1 disorder   . ODD (oppositional defiant disorder)   . ADHD (attention deficit hyperactivity disorder)   . Schizophrenia   . Suicidal ideation   . Homicidal ideation   . Explosive personality disorder    History reviewed. No pertinent past surgical history. Family History  Problem Relation Age of Onset  . Asthma Mother   . Asthma Sister   . Thyroid disease     History  Substance Use Topics  . Smoking status: Never Smoker   . Smokeless tobacco: Not on file  . Alcohol Use: No     Comment: sometimes    Review of Systems  Gastrointestinal: Negative for abdominal pain.  Psychiatric/Behavioral: Positive for suicidal ideas, homicidal ideas and hallucinations (chronically).  All other systems reviewed and are negative.     Allergies  Carbamazepine; Geodon; Haldol; Strattera; Other; and Shellfish allergy  Home Medications   Prior to Admission medications   Medication Sig Start Date End Date Taking? Authorizing  Provider  asenapine (SAPHRIS) 5 MG SUBL 24 hr tablet Place 5 mg under the tongue 2 (two) times daily.   Yes Historical Provider, MD  DiphenhydrAMINE HCl (BENADRYL PO) Take 1 tablet by mouth once.   Yes Historical Provider, MD  fluvoxaMINE (LUVOX) 50 MG tablet Take 50 mg by mouth at bedtime.   Yes Historical Provider, MD  ibuprofen (ADVIL,MOTRIN) 200 MG tablet Take 200 mg by mouth every 6 (six) hours as needed (harm himself).   Yes Historical Provider, MD  Pseudoephedrine-APAP-DM (DAYQUIL PO) Take 30 mLs by mouth once.   Yes Historical Provider, MD  QUEtiapine (SEROQUEL) 100 MG tablet Take 100 mg by mouth 2 (two) times daily as needed (for mood).   Yes Historical Provider, MD  traZODone (DESYREL) 50 MG tablet Take 50 mg by mouth at bedtime as needed for sleep (sleep).    Yes Historical Provider, MD  albuterol (PROVENTIL HFA;VENTOLIN HFA) 108 (90 BASE) MCG/ACT inhaler Inhale 1 puff into the lungs every 6 (six) hours as needed for wheezing or shortness of breath. 03/25/14   Doris Cheadle, MD  benztropine (COGENTIN) 1 MG tablet Take 1 tablet (1 mg total) by mouth 2 (two) times daily. Patient not taking: Reported on 05/18/2014 10/13/13   Beau Fanny, FNP  EPINEPHrine (EPIPEN 2-PAK IJ) Inject as directed.    Historical Provider, MD  fluticasone (FLOVENT HFA) 110 MCG/ACT inhaler Inhale 2 puffs into the lungs every 12 (twelve) hours. 03/25/14   Doris Cheadle, MD  Vitamin D, Ergocalciferol, (DRISDOL) 50000 UNITS CAPS capsule Take 1 capsule (50,000 Units total) by mouth every 7 (seven) days. Patient not taking: Reported  on 05/18/2014 03/30/14   Doris Cheadleeepak Advani, MD   BP 119/67 mmHg  Pulse 89  Temp(Src) 97.6 F (36.4 C) (Oral)  Resp 18  SpO2 99% Physical Exam  Constitutional: He is oriented to person, place, and time. He appears well-developed and well-nourished.  HENT:  Head: Normocephalic and atraumatic.  Eyes: Conjunctivae and EOM are normal.  Neck: Normal range of motion. Neck supple.  Cardiovascular:  Normal rate, regular rhythm and normal heart sounds.   Pulmonary/Chest: Effort normal and breath sounds normal. No respiratory distress.  Abdominal: He exhibits no distension. There is no tenderness. There is no rebound and no guarding.  Musculoskeletal: Normal range of motion.  Neurological: He is alert and oriented to person, place, and time.  Skin: Skin is warm and dry.  Vitals reviewed.   ED Course  Procedures (including critical care time) Labs Review Labs Reviewed  COMPREHENSIVE METABOLIC PANEL - Abnormal; Notable for the following:    Glucose, Bld 100 (*)    All other components within normal limits  URINE RAPID DRUG SCREEN (HOSP PERFORMED) - Abnormal; Notable for the following:    Benzodiazepines POSITIVE (*)    All other components within normal limits  ACETAMINOPHEN LEVEL - Abnormal; Notable for the following:    Acetaminophen (Tylenol), Serum <10.0 (*)    All other components within normal limits  ACETAMINOPHEN LEVEL - Abnormal; Notable for the following:    Acetaminophen (Tylenol), Serum <10.0 (*)    All other components within normal limits  CBC WITH DIFFERENTIAL/PLATELET  ETHANOL  SALICYLATE LEVEL  ACETAMINOPHEN LEVEL    Imaging Review No results found.   EKG Interpretation   Date/Time:  Friday May 21 2014 17:24:00 EDT Ventricular Rate:  90 PR Interval:  160 QRS Duration: 94 QT Interval:  352 QTC Calculation: 431 R Axis:   36 Text Interpretation:  Sinus rhythm ST elev, probable normal early repol  pattern No significant change since last tracing Confirmed by Mirian MoGentry,  Ugo Thoma [phone]([id number]) on 05/21/2014 5:29:28 PM      MDM   Final diagnoses:  Schizoaffective disorder, bipolar type  Suicidal ideation    24 y.o. male with pertinent PMH of bipolar 1, schizophrenia, multiple prior visits for same presents with recurrent SI/HI.  Pt [facility] due to group therapy and daily therapy.  Reportedly  the pt took a bottle of advil and was drinking a bottle of dayquil on police arrival.  Physical exam benign.  Pt states that he took all his medicine yesterday, but then in history today he states he took more medicine today.    Wu unremarkable, including 4 hour acetaminophen level.  Given history, doubt pt actually ingested significant amount of dayquil, and although he also endorsed ETOH consumption, his ETOH level is negative.  DC home in stable condition after psych evaluation with recommendation to dc home.    I have reviewed all laboratory and imaging studies if ordered as above  1. Schizoaffective disorder, bipolar type   2. Suicidal ideation         Mirian MoMatthew Darrel Baroni, MD 05/21/14 2328

## 2014-05-21 NOTE — [facility] Suicide Risk Assessment (Signed)
Suicide Risk Assessment  Discharge Assessment   [facility]   Demographic Factors:  Male and Adolescent or young adult  Total Time spent with patient: 45 minutes   Musculoskeletal: Strength & Muscle Tone: within normal limits Gait & Station: normal Patient leans: N/A  Psychiatric Specialty Exam:     Blood pressure 128/66, pulse 102, temperature 98.7 F (37.1 C), temperature source Oral, SpO2 96 %.There is no weight on file to calculate BMI.  General Appearance: Casual  Eye Contact::  Good  Speech:  Normal Rate  Volume:  Normal  Mood:  Euthymic  Affect:  Congruent  Thought Process:  Coherent  Orientation:  Full (Time, Place, and Person)  Thought Content:  WDL  Suicidal Thoughts:  No  Homicidal Thoughts:  No  Memory:  Immediate;   Good Recent;   Good Remote;   Good  Judgement:  Fair  Insight:  Fair  Psychomotor Activity:  Normal  Concentration:  Good  Recall:  Good  Fund of Knowledge:Good  Language: Good  Akathisia:  No  Handed:  Right  AIMS (if indicated):     Assets:  Housing Leisure Time Physical Health Resilience Social Support  ADL's:  Intact  Cognition: WNL  Sleep:          Has this patient used any form of tobacco in the last 30 days? (Cigarettes, Smokeless Tobacco, Cigars, and/or Pipes) No  Mental Status Per Nursing Assessment::   On Admission:   Schizoaffective Disorder  Current Mental Status by Physician: NA  Loss Factors: NA  Historical Factors: NA  Risk Reduction Factors:   Sense of responsibility to family, Employed, Living with another person, especially a relative, Positive social support and Positive therapeutic relationship  Continued Clinical Symptoms:  NOne  Cognitive Features That Contribute To Risk:  None    Suicide Risk:  Minimal: No identifiable suicidal ideation.  Patients presenting with no risk factors but with morbid ruminations; may be classified as minimal risk based on the severity of  the depressive symptoms  Principal Problem: <principal problem not specified> Discharge Diagnoses:  Patient Active Problem List   Diagnosis Date Noted  . Schizoaffective disorder, bipolar type [F25.0]     Priority: High  . Drug overdose, intentional [T50.902A] 10/09/2013    Priority: High  . Aggression [F60.89] 08/22/2013    Priority: High  . Asthma, chronic [J45.909] 03/25/2014  . Obesity, morbid [E66.01] 03/25/2014      Plan Of Care/Follow-up recommendations:  Activity:  as tolerated Diet:  heart healthy diet  Is patient on multiple antipsychotic therapies at discharge:  No   Has Patient had three or more failed trials of antipsychotic monotherapy by history:  No  Recommended Plan for Multiple Antipsychotic Therapies: NA    Linsey Arteaga, PMH-NP 05/21/2014, 6:55 PM

## 2014-05-21 NOTE — ED Notes (Signed)
Bed: RESA Expected date:  Expected time:  Means of arrival:  Comments: EMS/O.D. 

## 2014-05-21 NOTE — Discharge Instructions (Signed)
For your ongoing behavioral health needs, you are advised to keep your intake appointment with the [facility] on Tuesday, 05/25/2014:       [facility]      [address]      [place], [place] [address]       [phone]  If you have a behavioral health crisis before your appointment date, contact [facility]:       [facility]      [address]      [place], [place] [address]      [phone]

## 2014-05-21 NOTE — BH Assessment (Signed)
Provided pt with suicide prevention education. Reminded him that he stated he just wants to be able to talk to someone, and that he does not have to make suicidal gestures to achieve this. Provided him with hotline and crisis numbers and discussed how he can use them. Pt asked relevant questions and voiced understanding.   Clista BernhardtNancy Jamisha Hoeschen, [facility] Triage Specialist 05/21/2014 9:22 PM

## 2014-05-21 NOTE — ED Notes (Signed)
Belongings searched and documented on Inventory of Personal Effects worksheet Security called and made aware of need to wand patient and pt's belongings

## 2014-05-21 NOTE — ED Notes (Addendum)
Pt is a 24 year old who [facility]. He stated he wanted to die due to a lot of losses. His GF broke up with him and his GM has cancer. Pt appears very lethargic and his affect is blunted and depressed. He stated,"I took an overdose of medications." Pt has passive Si but does contract for safety. Pt did request a [place] sandwich and a sprite upon admission to the [facility]. Pt denies any auditory or visual hallucinations. Pt does appear limited and this is his fourth visit this week to the ER. Poison control phoned- Macon LargeMaryanne at [phone] to find out the results of pts EKG. Informed poison control that pt remains groggy. He has been drinking fluids and has voided w/o difficulty. Pt does not have any conduction delays on his EKG. Will continue to monitor closely. Pt stated he did take 50 benedryl each were 25mg . 9:30am-pt was encouraged to stay awake to eat breakfast. He stated,"My GF and I broke up until further notice. I have been with her since the age of 15."9:30a- Pt stated,"give me the yellow pages I should not be leaving and I want to talk to a lawyer." [facility]. Pt stated,"if you release me I will kidnap some people and will have a standoff with the cops."Pt stated,"I need to [facility] for [facility]." Spoke with Dr. Ladona Ridgelaylor and NP who was made aware of nursing concerns. Pt is for discharge. 10:30a- pt stated ,"I am warning you if you release me out in society it will not be good." Police officer wrote down pts address and is aware of the situation that the pt is being discharged.

## 2014-05-21 NOTE — ED Notes (Signed)
Pharmacy tech entered room to speak with patient, patient would not respond to her. Patient was sternal rubbed, patient aroused and began answering questions.

## 2014-05-21 NOTE — ED Notes (Signed)
Pt refused to take any of his discharge  papers with him. He was given back all of his belongings. Pt stated he [facility]. [facility] phoned back and was made aware of the situation that transpired. Pt used one of his bus passes to get home. Pt told the police who walked  him out that he promised he would not do anything. Per the police officer the pt was much calmer once he left the [facility].Spoke with Dr. Ladona Ridgelaylor about the situation as well.

## 2014-05-21 NOTE — ED Notes (Signed)
Patient ambulated to restroom.

## 2014-05-21 NOTE — ED Notes (Signed)
Pt. Noted in hall. No complaints or concerns voiced. No distress or abnormal behavior noted. Will continue to monitor with security cameras. Q 15 minute rounds continue. 

## 2014-05-21 NOTE — Discharge Instructions (Signed)
Depression °Depression refers to feeling sad, low, down in the dumps, blue, gloomy, or empty. In general, there are two kinds of depression: °· Normal sadness or normal grief. This kind of depression is one that we all feel from time to time after upsetting life experiences, such as the loss of a job or the ending of a relationship. This kind of depression is considered normal, is short lived, and resolves within a few days to 2 weeks. Depression experienced after the loss of a loved one (bereavement) often lasts longer than 2 weeks but normally gets better with time. °· Clinical depression. This kind of depression lasts longer than normal sadness or normal grief or interferes with your ability to function at home, at work, and in school. It also interferes with your personal relationships. It affects almost every aspect of your life. Clinical depression is an illness. °Symptoms of depression can also be caused by conditions other than those mentioned above, such as: °· Physical illness. Some physical illnesses, including underactive thyroid gland (hypothyroidism), severe anemia, specific types of cancer, diabetes, uncontrolled seizures, heart and lung problems, strokes, and chronic pain are commonly associated with symptoms of depression. °· Side effects of some prescription medicine. In some people, certain types of medicine can cause symptoms of depression. °· Substance abuse. Abuse of alcohol and illicit drugs can cause symptoms of depression. °SYMPTOMS °Symptoms of normal sadness and normal grief include the following: °· Feeling sad or crying for short periods of time. °· Not caring about anything (apathy). °· Difficulty sleeping or sleeping too much. °· No longer able to enjoy the things you used to enjoy. °· Desire to be by oneself all the time (social isolation). °· Lack of energy or motivation. °· Difficulty concentrating or remembering. °· Change in appetite or weight. °· Restlessness or  agitation. °Symptoms of clinical depression include the same symptoms of normal sadness or normal grief and also the following symptoms: °· Feeling sad or crying all the time. °· Feelings of guilt or worthlessness. °· Feelings of hopelessness or helplessness. °· Thoughts of suicide or the desire to harm yourself (suicidal ideation). °· Loss of touch with reality (psychotic symptoms). Seeing or hearing things that are not real (hallucinations) or having false beliefs about your life or the people around you (delusions and paranoia). °DIAGNOSIS  °The diagnosis of clinical depression is usually based on how bad the symptoms are and how long they have lasted. Your health care provider will also ask you questions about your medical history and substance use to find out if physical illness, use of prescription medicine, or substance abuse is causing your depression. Your health care provider may also order blood tests. °TREATMENT  °Often, normal sadness and normal grief do not require treatment. However, sometimes antidepressant medicine is given for bereavement to ease the depressive symptoms until they resolve. °The treatment for clinical depression depends on how bad the symptoms are but often includes antidepressant medicine, counseling with a mental health professional, or both. Your health care provider will help to determine what treatment is best for you. °Depression caused by physical illness usually goes away with appropriate medical treatment of the illness. If prescription medicine is causing depression, talk with your health care provider about stopping the medicine, decreasing the dose, or changing to another medicine. °Depression caused by the abuse of alcohol or illicit drugs goes away when you stop using these substances. Some adults need professional help in order to stop drinking or using drugs. °SEEK IMMEDIATE MEDICAL   CARE IF: °· You have thoughts about hurting yourself or others. °· You lose touch  with reality (have psychotic symptoms). °· You are taking medicine for depression and have a serious side effect. °FOR MORE INFORMATION °· National Alliance on Mental Illness: www.nami.org  °· National Institute of Mental Health: www.nimh.nih.gov  °Document Released: 02/10/2000 Document Revised: 06/29/2013 Document Reviewed: 05/14/2011 °[employer]® Patient Information ©2015 [employer]. This information is not intended to replace advice given to you by your health care provider. Make sure you discuss any questions you have with your health care provider. ° °No-harm Safety Contract  °A no-harm safety contract is a written or verbal agreement between you and a mental health professional to promote safety. It contains specific actions and promises you agree to. The agreement also includes instructions from the therapist or doctor. The instructions will help prevent you from harming yourself or harming others. Harm can be as mild as pinching yourself, but can increase in intensity to actions like burning or cutting yourself. The extreme level of self-harm would be committing suicide. No-harm safety contracts are also sometimes referred to as a no-suicide contract, suicide prevention contract, no-harm agreements or decisions, or a safety contract.  °REASONS FOR NO-HARM SAFETY CONTRACTS °Safety contracts are just one part of an overall treatment plan to help keep you safe and free of harm. A safety contract may help to relieve anxiety, restore a sense of control, state clearly the alternatives to harm or suicide, and give you and your therapist or doctor a gauge for how you are doing in between visits. °Many factors impact the decision to use a no-harm safety contract and its effectiveness. A proper overall treatment plan and evaluation and good patient understanding are the keys to good outcomes. °CONTRACT ELEMENTS  °A contract can range from simple to complex. They include all or some of the following:  °Action  statements. These are statements you agree to do or not do. °Example: If I feel my life is becoming too difficult, I agree to do the following so there is no harm to myself or others: °· Talk with family or friends. °· Rid myself of all things that I could use to harm myself. °· Do an activity I enjoy or have enjoyed in the recent past. °Coping strategies. These are ways to think and feel that decrease stress, such as: °· Use of affirmations or positive statements about self. °· Good self-care, including improved grooming, and healthy eating, and healthy sleeping patterns. °· Increase physical exercise. °· Increase social involvement. °· Focus on positive aspects of life. °Crisis management. This would include what to do if there was trouble following the contract or an urge to harm. This might include notifying family or your therapist of suicidal thoughts. Be open and honest about suicidal urges. To prevent a crisis, do the following: °· List reasons to reach out for support. °· Keep contact numbers and available hours handy. °Treatment goals. These are goals would include no suicidal thoughts, improved mood, and feelings of hopefulness. °Listed responsibilities of different people involved in care. This could include family members. A family member may agree to remove firearms or other lethal weapons/substances from your ease of access. °A timeline. A timeline can be in place from one therapy session to the next session. °HOME CARE INSTRUCTIONS  °· Follow your no-harm safety contract. °· Contact your therapist and/or doctor if you have any questions or concerns. °MAKE SURE YOU:  °· Understand these instructions. °· Will watch your condition. Noticing   any mood changes or suicidal urges.  Will get help right away if you are not doing well or get worse. Document Released: 08/02/2009 Document Revised: 05/07/2011 Document Reviewed: 08/02/2009 [facility] Patient Information 2015 [place], [place]. This information is  not intended to replace advice given to you by your health care provider. Make sure you discuss any questions you have with your health care provider.

## 2014-05-21 NOTE — ED Notes (Signed)
Security called to wand patient and belongings.  
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
## 2014-05-21 NOTE — ED Notes (Signed)
Report given to Jillyn HiddenGary, RN in Psych ED Patient and belongings wanded by Fayrene FearingJames in secutiry

## 2014-05-21 NOTE — ED Notes (Signed)
Patient and belongings wanded by security at this time.

## 2014-05-21 NOTE — Consult Note (Signed)
[place] [facility] Consult   Reason for Consult:  Claiming he took overdose of 60 clonazepam and alcohol Referring Physician:  ED MD Patient Identification: [name] MRN:  [id number] Principal Diagnosis: Schizoaffective disorder, bipolar type Diagnosis:   Patient Active Problem List   Diagnosis Date Noted  . Schizoaffective disorder, bipolar type [F25.0]   . Asthma, chronic [J45.909] 03/25/2014  . Obesity, morbid [E66.01] 03/25/2014  . Drug overdose, intentional [T50.902A] 10/09/2013  . Aggression [F60.89] 08/22/2013  . Schizoaffective disorder [F25.9] 08/04/2013    Total Time spent with patient: 45 minutes  Subjective:   [name] is a 24 y.o. male patient admitted with claims of overdose and alcohol.  HPI:  [name] is not believable anymore as he has had many claims of overdoses, psychotic symptoms, depression which have not been true.  He has been here at least 3 times this week with different stories.  Today he says he took an overdose and is perfectly alert and not in distress.  He was just discharged yesterday and the day before.  Today he maintains he is depressed after an argument with his girlfriend as well as grieving a couple of deaths of relatives.  He has had hospitalizations in the past to no avail, referrals to outpatient therapy and med management as well as to ACT teams.  He has not benefited from any.  He has a current referral to [facility] team and a representative met with him yesterday.  He already says he plans to fire the ACT.  They have been trying to meet with him for a couple of weeks but he is not there or at an ED they reported. HPI Elements:   Location:  maintains he took an overdose. Quality:  stories no longer believable. Severity:  do not trust his stories. Timing:  no precipitants. Duration:  as long as he has lived here about 2 years. Context:  as above.  Past Medical History:  Past Medical History  Diagnosis Date  . Asthma   .  Bipolar 1 disorder   . ODD (oppositional defiant disorder)   . ADHD (attention deficit hyperactivity disorder)   . Schizophrenia   . Suicidal ideation   . Homicidal ideation   . Explosive personality disorder    History reviewed. No pertinent past surgical history. Family History:  Family History  Problem Relation Age of Onset  . Asthma Mother   . Asthma Sister   . Thyroid disease     Social History:  History  Alcohol Use No    Comment: sometimes     History  Drug Use No    Comment: Patient denies     History   Social History  . Marital Status: Single    Spouse Name: N/A  . Number of Children: N/A  . Years of Education: N/A   Social History Main Topics  . Smoking status: Never Smoker   . Smokeless tobacco: Not on file  . Alcohol Use: No     Comment: sometimes  . Drug Use: No     Comment: Patient denies   . Sexual Activity: Not on file   Other Topics Concern  . None   Social History Narrative   Additional Social History:    Pain Medications: SEE PTA Prescriptions: SEE PTA Over the Counter: SEE PTA reports he took an overdose of bendryl and clonazapam prior to arrival  History of alcohol / drug use?:  (pt reports he drinks alcohol on occassion ) Longest period  of sobriety (when/how long): unknown Negative Consequences of Use:  (NA) Withdrawal Symptoms:  (NA) Name of Substance 1: etoh  1 - Age of First Use: unknown 1 - Amount (size/oz): unknown 1 - Frequency: reports drinks only on occassion 1 - Duration: unknown  1 - Last Use / Amount: reported he drank today but BAL was less than 5                   Allergies:   Allergies  Allergen Reactions  . Carbamazepine Anaphylaxis, Other (See Comments) and Cough    Cough up blood  . Geodon [Ziprasidone Hcl] Anaphylaxis and Other (See Comments)    " Causes my throat to close"  . Haldol [Haloperidol Lactate] Anaphylaxis and Other (See Comments)    Patient reports that he stopped taking this because of  throat swelling.   Christianne Borrow [Atomoxetine] Anaphylaxis, Other (See Comments) and Cough    Cough up blood  . Other Nausea Only and Other (See Comments)    Apple Juice, stomach hurts and itchy throat   . Shellfish Allergy Swelling    Throat swelling    Labs:  Results for orders placed or performed during the hospital encounter of 05/21/14 (from the past 48 hour(s))  Drug screen panel, emergency     Status: Abnormal   Collection Time: 05/21/14  3:20 AM  Result Value Ref Range   Opiates NONE DETECTED NONE DETECTED   Cocaine NONE DETECTED NONE DETECTED   Benzodiazepines POSITIVE (A) NONE DETECTED   Amphetamines NONE DETECTED NONE DETECTED   Tetrahydrocannabinol NONE DETECTED NONE DETECTED   Barbiturates NONE DETECTED NONE DETECTED    Comment:        DRUG SCREEN FOR MEDICAL PURPOSES ONLY.  IF CONFIRMATION IS NEEDED FOR ANY PURPOSE, NOTIFY LAB WITHIN 5 DAYS.        LOWEST DETECTABLE LIMITS FOR URINE DRUG SCREEN Drug Class       Cutoff (ng/mL) Amphetamine      1000 Barbiturate      200 Benzodiazepine   174 Tricyclics       081 Opiates          300 Cocaine          300 THC              50   CBC with Differential/Platelet     Status: None   Collection Time: 05/21/14  3:39 AM  Result Value Ref Range   WBC 6.0 4.0 - 10.5 K/uL   RBC 5.37 4.22 - 5.81 MIL/uL   Hemoglobin 14.6 13.0 - 17.0 g/dL   HCT 43.3 39.0 - 52.0 %   MCV 80.6 78.0 - 100.0 fL   MCH 27.2 26.0 - 34.0 pg   MCHC 33.7 30.0 - 36.0 g/dL   RDW 13.7 11.5 - 15.5 %   Platelets 335 150 - 400 K/uL   Neutrophils Relative % 52 43 - 77 %   Neutro Abs 3.1 1.7 - 7.7 K/uL   Lymphocytes Relative 36 12 - 46 %   Lymphs Abs 2.2 0.7 - 4.0 K/uL   Monocytes Relative 8 3 - 12 %   Monocytes Absolute 0.5 0.1 - 1.0 K/uL   Eosinophils Relative 4 0 - 5 %   Eosinophils Absolute 0.2 0.0 - 0.7 K/uL   Basophils Relative 0 0 - 1 %   Basophils Absolute 0.0 0.0 - 0.1 K/uL  Ethanol     Status: None   Collection Time: 05/21/14  3:39  AM   Result Value Ref Range   Alcohol, Ethyl (B) <5 0 - 9 mg/dL    Comment:        LOWEST DETECTABLE LIMIT FOR SERUM ALCOHOL IS 11 mg/dL FOR MEDICAL PURPOSES ONLY   Acetaminophen level     Status: Abnormal   Collection Time: 05/21/14  3:39 AM  Result Value Ref Range   Acetaminophen (Tylenol), Serum <10.0 (L) 10 - 30 ug/mL    Comment:        THERAPEUTIC CONCENTRATIONS VARY SIGNIFICANTLY. A RANGE OF 10-30 ug/mL MAY BE AN EFFECTIVE CONCENTRATION FOR MANY PATIENTS. HOWEVER, SOME ARE BEST TREATED AT CONCENTRATIONS OUTSIDE THIS RANGE. ACETAMINOPHEN CONCENTRATIONS >150 ug/mL AT 4 HOURS AFTER INGESTION AND >50 ug/mL AT 12 HOURS AFTER INGESTION ARE OFTEN ASSOCIATED WITH TOXIC REACTIONS.   Salicylate level     Status: None   Collection Time: 05/21/14  3:39 AM  Result Value Ref Range   Salicylate Lvl <7.8 2.8 - 20.0 mg/dL  Magnesium     Status: None   Collection Time: 05/21/14  3:39 AM  Result Value Ref Range   Magnesium 2.0 1.5 - 2.5 mg/dL  I-stat chem 8, ed     Status: None   Collection Time: 05/21/14  3:47 AM  Result Value Ref Range   Sodium 141 135 - 145 mmol/L   Potassium 4.0 3.5 - 5.1 mmol/L   Chloride 100 96 - 112 mmol/L   BUN 6 6 - 23 mg/dL   Creatinine, Ser 1.00 0.50 - 1.35 mg/dL   Glucose, Bld 82 70 - 99 mg/dL   Calcium, Ion 1.18 1.12 - 1.23 mmol/L   TCO2 25 0 - 100 mmol/L   Hemoglobin 16.3 13.0 - 17.0 g/dL   HCT 48.0 39.0 - 52.0 %  Acetaminophen level     Status: Abnormal   Collection Time: 05/21/14  6:18 AM  Result Value Ref Range   Acetaminophen (Tylenol), Serum <10.0 (L) 10 - 30 ug/mL    Comment:        THERAPEUTIC CONCENTRATIONS VARY SIGNIFICANTLY. A RANGE OF 10-30 ug/mL MAY BE AN EFFECTIVE CONCENTRATION FOR MANY PATIENTS. HOWEVER, SOME ARE BEST TREATED AT CONCENTRATIONS OUTSIDE THIS RANGE. ACETAMINOPHEN CONCENTRATIONS >150 ug/mL AT 4 HOURS AFTER INGESTION AND >50 ug/mL AT 12 HOURS AFTER INGESTION ARE OFTEN ASSOCIATED WITH TOXIC REACTIONS.    Salicylate level     Status: None   Collection Time: 05/21/14  6:18 AM  Result Value Ref Range   Salicylate Lvl <2.4 2.8 - 20.0 mg/dL    Vitals: Blood pressure 124/80, pulse 83, temperature 98.2 F (36.8 C), temperature source Oral, resp. rate 20, SpO2 99 %.  Risk to Self: Suicidal Ideation: Yes-Currently Present Suicidal Intent:  (reports overdosed and still feels a little SI per pt) Is patient at risk for suicide?: Yes Suicidal Plan?: Yes-Currently Present Specify Current Suicidal Plan: overdose Access to Means: Yes Specify Access to Suicidal Means: medication and OTC medications What has been your use of drugs/alcohol within the last 12 months?: etoh use  How many times?:  ("too many to count" ) Other Self Harm Risks: cutting Triggers for Past Attempts: Unpredictable (conflict with girl friend ) Intentional Self Injurious Behavior: Cutting Comment - Self Injurious Behavior: hx of cutting scars on arms, reports none in the last 6 months  Risk to Others: Homicidal Ideation: No Thoughts of Harm to Others: No Comment - Thoughts of Harm to Others: none reported Current Homicidal Intent: No Current Homicidal Plan: No Describe Current Homicidal Plan:  none Access to Homicidal Means: No Describe Access to Homicidal Means: none Identified Victim: none History of harm to others?: Yes Assessment of Violence:  (unknown reports hx of violence) Violent Behavior Description: unknown Does patient have access to weapons?: No Criminal Charges Pending?: No Does patient have a court date: No Prior Inpatient Therapy: Prior Inpatient Therapy: Yes Prior Therapy Dates: Multiple since age 77 Prior Therapy Facilty/Provider(s): [place], [place], [place] Reason for Treatment: SI, psychosis, aggression Prior Outpatient Therapy: Prior Outpatient Therapy: Yes Prior Therapy Dates: Ongoing Prior Therapy [facility]ilty[facility]Provider[facility] Reason for Treatment: Med mgmt / [facility]T  No current  facility-administered medications for this encounter.   Current Outpatient Prescriptions  Medication Sig Dispense Refill  . traZODone (DESYREL) 50 MG tablet Take 50 mg by mouth at bedtime as needed for sleep.    Marland Kitchen albuterol (PROVENTIL HFA;VENTOLIN HFA) 108 (90 BASE) MCG/ACT inhaler Inhale 1 puff into the lungs every 6 (six) hours as needed for wheezing or shortness of breath. 18 g 3  . asenapine (SAPHRIS) 5 MG SUBL 24 hr tablet Place 5 mg under the tongue 2 (two) times daily.    . benztropine (COGENTIN) 1 MG tablet Take 1 tablet (1 mg total) by mouth 2 (two) times daily. (Patient not taking: Reported on 05/18/2014) 60 tablet 0  . EPINEPHrine (EPIPEN 2-PAK IJ) Inject as directed.    . fluticasone (FLOVENT HFA) 110 MCG/ACT inhaler Inhale 2 puffs into the lungs every 12 (twelve) hours. 1 Inhaler 12  . fluvoxaMINE (LUVOX) 50 MG tablet Take 50 mg by mouth at bedtime.    Marland Kitchen QUEtiapine (SEROQUEL) 100 MG tablet Take 100 mg by mouth 2 (two) times daily as needed (for mood).    . Vitamin D, Ergocalciferol, (DRISDOL) 50000 UNITS CAPS capsule Take 1 capsule (50,000 Units total) by mouth every 7 (seven) days. (Patient not taking: Reported on 05/18/2014) 12 capsule 0    Musculoskeletal: Strength & Muscle Tone: within normal limits Gait & Station: normal Patient leans: N/A  Psychiatric Specialty Exam: Physical Exam  ROS  Blood pressure 124/80, pulse 83, temperature 98.2 F (36.8 C), temperature source Oral, resp. rate 20, SpO2 99 %.There is no weight on file to calculate BMI.  General Appearance: Casual  Eye Contact::  Good  Speech:  Clear and Coherent  Volume:  Normal  Mood:  Angry  Affect:  Congruent  Thought Process:  Coherent  Orientation:  Full (Time, Place, and Person)  Thought Content:  Negative  Suicidal Thoughts:  Yes.  with intent/plan but I doubt he is serious  Homicidal Thoughts:  No  Memory:  Immediate;   Good Recent;   Good Remote;   Good  Judgement:  Poor  Insight:  Lacking   Psychomotor Activity:  Normal  Concentration:  Good  Recall:  Good  Fund of Knowledge:Good  Language: Good  Akathisia:  Negative  Handed:  Right  AIMS (if indicated):     Assets:  Communication Skills Housing Physical Health  ADL's:  Intact  Cognition: WNL  Sleep:      Medical Decision Making: Established Problem, Worsening (2)  Treatment Plan Summary: I believe [name] is malingering and no longer believe his stories.  He is capable of proving me wrong and will likely be back again soon which is his pattern.  Plan:  makes veiled threats "you will be sorry"  "If I kill myself who will be responsible?"  He did leave on his own without anyresistance. Disposition: He has an appointment with his  ACT team on 29 March and they will come to his house.  He already says he thinks he will fire them.  Clarene Reamer 05/21/2014 3:18 PM

## 2014-05-21 NOTE — ED Notes (Signed)
Dr. Palumbo at bedside. 

## 2014-05-21 NOTE — [facility] Suicide Risk Assessment (Signed)
[facility]   Demographic Factors:  24 year old male  Total Time spent with patient: 45 minutes  Musculoskeletal: Strength & Muscle Tone: within normal limits Gait & Station: normal Patient leans: na  Psychiatric Specialty Exam: Physical Exam  ROS  Blood pressure 124/80, pulse 83, temperature 98.2 F (36.8 C), temperature source Oral, resp. rate 20, SpO2 99 %.There is no weight on file to calculate BMI.  General Appearance: Casual  Eye Contact::  Good  Speech:  Clear and Coherent409  Volume:  Normal  Mood:  Angry  Affect:  Appropriate  Thought Process:  Coherent  Orientation:  Full (Time, Place, and Person)  Thought Content:  Negative  Suicidal Thoughts:  Yes.  with intent/plan but I do not trust his story anymore  Homicidal Thoughts:  No  Memory:  Immediate;   Good Recent;   Good Remote;   Good  Judgement:  Poor  Insight:  Lacking  Psychomotor Activity:  Normal  Concentration:  Good  Recall:  Good  Fund of Knowledge:Good  Language: Good  Akathisia:  Negative  Handed:  Right  AIMS (if indicated):     Assets:  Communication Skills Housing Physical Health  Sleep:     Cognition: WNL  ADL's:  Intact      Has this patient used any form of tobacco in the last 30 days? (Cigarettes, Smokeless Tobacco, Cigars, and/or Pipes) N/A  Mental Status Per Nursing Assessment::   On Admission:     Current Mental Status by Physician: makes regular suicidal threats and false claims ove overdoses  Loss Factors: NA  Historical Factors: Prior suicide attempts and Impulsivity  Risk Reduction Factors:   Living with another person, especially a relative and Positive social support  Continued Clinical Symptoms:  Previous Psychiatric Diagnoses and Treatments  Cognitive Features That Contribute To Risk:  Closed-mindedness    Suicide Risk:  Mild:  Suicidal ideation of limited frequency, intensity, duration, and specificity.  There are no  identifiable plans, no associated intent, mild dysphoria and related symptoms, good self-control (both objective and subjective assessment), few other risk factors, and identifiable protective factors, including available and accessible social support.  Principal Problem: Schizoaffective disorder, bipolar type Discharge Diagnoses:  Patient Active Problem List   Diagnosis Date Noted  . Schizoaffective disorder, bipolar type [F25.0]   . Asthma, chronic [J45.909] 03/25/2014  . Obesity, morbid [E66.01] 03/25/2014  . Drug overdose, intentional [T50.902A] 10/09/2013  . Aggression [F60.89] 08/22/2013  . Schizoaffective disorder [F25.9] 08/04/2013      Plan Of Care/Follow-up recommendations:  Activity:  resume usual activity Diet:  resume usual diet  Is patient on multiple antipsychotic therapies at discharge:  No   Has Patient had three or more failed trials of antipsychotic monotherapy by history:  No  Recommended Plan for Multiple Antipsychotic Therapies: NA    [name] D 05/21/2014, 3:44 PM

## 2014-05-21 NOTE — ED Notes (Signed)
Bed: RESA Expected date:  Expected time:  Means of arrival:  Comments: EMS OD 24 y o M

## 2014-05-21 NOTE — ED Provider Notes (Signed)
CSN: [id number]     Arrival date & time 05/21/14  0304 History   First MD Initiated Contact with Patient 05/21/14 0309     Chief Complaint  Patient presents with  . Drug Overdose     (Consider location/radiation/quality/duration/timing/severity/associated sxs/prior Treatment) Patient is a 24 y.o. male presenting with Overdose. The history is provided by the patient and the EMS personnel. No language interpreter was used.  Drug Overdose This is a recurrent problem. The current episode started 1 to 2 hours ago. The problem occurs constantly. The problem has not changed since onset.Pertinent negatives include no chest pain, no abdominal pain, no headaches and no shortness of breath. Nothing aggravates the symptoms. Nothing relieves the symptoms. He has tried nothing for the symptoms. The treatment provided no relief.  Re[portedly took benadryl and klonopin  Past Medical History  Diagnosis Date  . Asthma   . Bipolar 1 disorder   . ODD (oppositional defiant disorder)   . ADHD (attention deficit hyperactivity disorder)   . Schizophrenia   . Suicidal ideation   . Homicidal ideation   . Explosive personality disorder    History reviewed. No pertinent past surgical history. Family History  Problem Relation Age of Onset  . Asthma Mother   . Asthma Sister   . Thyroid disease     History  Substance Use Topics  . Smoking status: Never Smoker   . Smokeless tobacco: Not on file  . Alcohol Use: No     Comment: sometimes    Review of Systems  Respiratory: Negative for shortness of breath.   Cardiovascular: Negative for chest pain.  Gastrointestinal: Negative for abdominal pain.  Neurological: Negative for headaches.  All other systems reviewed and are negative.     Allergies  Carbamazepine; Geodon; Haldol; Strattera; Other; and Shellfish allergy  Home Medications   Prior to Admission medications   Medication Sig Start Date End Date Taking? Authorizing Provider  albuterol  (PROVENTIL HFA;VENTOLIN HFA) 108 (90 BASE) MCG/ACT inhaler Inhale 1 puff into the lungs every 6 (six) hours as needed for wheezing or shortness of breath. 03/25/14   Doris Cheadleeepak Advani, MD  asenapine (SAPHRIS) 5 MG SUBL 24 hr tablet Place 5 mg under the tongue 2 (two) times daily.    Historical Provider, MD  benztropine (COGENTIN) 1 MG tablet Take 1 tablet (1 mg total) by mouth 2 (two) times daily. Patient not taking: Reported on 05/18/2014 10/13/13   Beau FannyJohn C Withrow, FNP  EPINEPHrine (EPIPEN 2-PAK IJ) Inject as directed.    Historical Provider, MD  fluticasone (FLOVENT HFA) 110 MCG/ACT inhaler Inhale 2 puffs into the lungs every 12 (twelve) hours. 03/25/14   Doris Cheadleeepak Advani, MD  fluvoxaMINE (LUVOX) 50 MG tablet Take 50 mg by mouth at bedtime.    Historical Provider, MD  QUEtiapine (SEROQUEL) 100 MG tablet Take 100 mg by mouth 2 (two) times daily as needed (for mood).    Historical Provider, MD  traZODone (DESYREL) 50 MG tablet Take 50 mg by mouth at bedtime as needed for sleep.    Historical Provider, MD  Vitamin D, Ergocalciferol, (DRISDOL) 50000 UNITS CAPS capsule Take 1 capsule (50,000 Units total) by mouth every 7 (seven) days. Patient not taking: Reported on 05/18/2014 03/30/14   Doris Cheadleeepak Advani, MD   There were no vitals taken for this visit. Physical Exam  Constitutional: He appears well-developed and well-nourished. No distress.  HENT:  Head: Normocephalic and atraumatic.  Mouth/Throat: Oropharynx is clear and moist.  Eyes: Conjunctivae are normal. Pupils are  equal, round, and reactive to light.  Neck: Normal range of motion. Neck supple.  Cardiovascular: Normal rate, regular rhythm and intact distal pulses.   Pulmonary/Chest: Breath sounds normal. No stridor. No respiratory distress. He has no wheezes. He has no rales.  Abdominal: Soft. Bowel sounds are normal. There is no tenderness. There is no rebound and no guarding.  Musculoskeletal: Normal range of motion.  Neurological: He is alert. He has  normal reflexes.  Skin: Skin is dry.    ED Course  Procedures (including critical care time) Labs Review Labs Reviewed  CBC WITH DIFFERENTIAL/PLATELET  URINE RAPID DRUG SCREEN (HOSP PERFORMED)  ETHANOL  ACETAMINOPHEN LEVEL  SALICYLATE LEVEL  I-STAT CHEM 8, ED    Imaging Review No results found.   EKG Interpretation None      MDM   Final diagnoses:  None    3rd visit for same.  Awake and alert and not tachycardic nor hallucinating.  Highly doubt took a whole bottle of benadryl but committed by EDP 3/23 and discharged by psych and now back.  Will need to be seen and admitted to psychiatry this time    Gabbie Marzo, MD 05/21/14 [phone]

## 2014-05-21 NOTE — ED Notes (Signed)
TTS in to see pt  

## 2014-05-21 NOTE — ED Notes (Signed)
Per EMS, pt took a full bottle of 24ct 200mg  Ibuprofen, part of a 12oz bottle of Dayquil, and "countless" benadryl within the last hour. [facility] A&Ox[facility], pt walked to stretcher.

## 2014-05-21 NOTE — ED Notes (Signed)
Poison Control notified. Recommendations: Watch hypertension, tachycardia, hallucinations, seizures, sedation. Do not give charcoal at this time. Watch for 6 hours from ingestion for medical clearance. Recommended labs: EKG, UDS, ETOH, Tylenol, Mag level, CMP, Salicylate. MD aware of recommendations.

## 2014-05-21 NOTE — ED Notes (Signed)
Pt. To [facility] from ED ambulatory without difficulty, to room  . [facility]. Pt. Is alert and oriented, warm and dry in no distress. Pt.  cooperative. Pt. Made aware of security cameras and Q15 minute rounds. Pt. Encouraged to let Nursing staff know of any concerns or needs.

## 2014-05-21 NOTE — BH Assessment (Signed)
Per Dr. Nicanor AlconPalumbo pt will be medically cleared at 0550. She reports it is unclear if pt has ingested any of the medication he stated that he took.   Called [facility] and spoke with Herbert SetaHeather who reports pt is not officially an [facility]T client yet and is in the process of being "on boarded." Herbert SetaHeather will call her supervisor to see if they can technically pick up pt, and call TTS writer back.   If pt can not be discharged back to ACTT, AM psychiatric evaluation is recommended. Dr. Nicanor AlconPalumbo is in agreement.   Clista BernhardtNancy Anais Denslow, [facility] Triage Specialist 05/21/2014 5:23 AM

## 2014-05-22 ENCOUNTER — Encounter (HOSPITAL_COMMUNITY): Payer: Self-pay

## 2014-05-22 ENCOUNTER — Emergency Department (HOSPITAL_COMMUNITY)
Admission: EM | Admit: 2014-05-22 | Discharge: 2014-05-24 | Disposition: A | Discharge status: Home / Self Care | Payer: Medicaid Other | Attending: Emergency Medicine | Admitting: Emergency Medicine

## 2014-05-22 DIAGNOSIS — F250 Schizoaffective disorder, bipolar type: Secondary | ICD-10-CM | POA: Diagnosis not present

## 2014-05-22 DIAGNOSIS — R45851 Suicidal ideations: Secondary | ICD-10-CM | POA: Diagnosis not present

## 2014-05-22 DIAGNOSIS — Z79899 Other long term (current) drug therapy: Secondary | ICD-10-CM | POA: Diagnosis not present

## 2014-05-22 DIAGNOSIS — Y900 Blood alcohol level of less than 20 mg/100 ml: Secondary | ICD-10-CM | POA: Diagnosis not present

## 2014-05-22 DIAGNOSIS — T391X2A Poisoning by 4-Aminophenol derivatives, intentional self-harm, initial encounter: Secondary | ICD-10-CM | POA: Insufficient documentation

## 2014-05-22 DIAGNOSIS — T50902A Poisoning by unspecified drugs, medicaments and biological substances, intentional self-harm, initial encounter: Secondary | ICD-10-CM | POA: Diagnosis present

## 2014-05-22 DIAGNOSIS — T1491 Suicide attempt: Secondary | ICD-10-CM | POA: Diagnosis not present

## 2014-05-22 DIAGNOSIS — J45909 Unspecified asthma, uncomplicated: Secondary | ICD-10-CM | POA: Insufficient documentation

## 2014-05-22 LAB — CBC WITH DIFFERENTIAL/PLATELET
BASOS PCT: 0 % (ref 0–1)
Basophils Absolute: 0 10*3/uL (ref 0.0–0.1)
EOS ABS: 0.2 10*3/uL (ref 0.0–0.7)
EOS PCT: 4 % (ref 0–5)
HEMATOCRIT: 43.9 % (ref 39.0–52.0)
Hemoglobin: 14.8 g/dL (ref 13.0–17.0)
Lymphocytes Relative: 42 % (ref 12–46)
Lymphs Abs: 2.5 10*3/uL (ref 0.7–4.0)
MCH: 27.3 pg (ref 26.0–34.0)
MCHC: 33.7 g/dL (ref 30.0–36.0)
MCV: 80.8 fL (ref 78.0–100.0)
Monocytes Absolute: 0.6 10*3/uL (ref 0.1–1.0)
Monocytes Relative: 10 % (ref 3–12)
Neutro Abs: 2.6 10*3/uL (ref 1.7–7.7)
Neutrophils Relative %: 44 % (ref 43–77)
Platelets: 331 10*3/uL (ref 150–400)
RBC: 5.43 MIL/uL (ref 4.22–5.81)
RDW: 13.7 % (ref 11.5–15.5)
WBC: 5.9 10*3/uL (ref 4.0–10.5)

## 2014-05-22 LAB — URINALYSIS, ROUTINE W REFLEX MICROSCOPIC
Bilirubin Urine: NEGATIVE
Glucose, UA: NEGATIVE mg/dL
HGB URINE DIPSTICK: NEGATIVE
Ketones, ur: NEGATIVE mg/dL
Leukocytes, UA: NEGATIVE
Nitrite: NEGATIVE
PH: 6 (ref 5.0–8.0)
Protein, ur: NEGATIVE mg/dL
Specific Gravity, Urine: 1.006 (ref 1.005–1.030)
Urobilinogen, UA: 0.2 mg/dL (ref 0.0–1.0)

## 2014-05-22 LAB — COMPREHENSIVE METABOLIC PANEL
ALK PHOS: 73 U/L (ref 39–117)
ALT: 28 U/L (ref 0–53)
ANION GAP: 11 (ref 5–15)
AST: 30 U/L (ref 0–37)
Albumin: 4.3 g/dL (ref 3.5–5.2)
BILIRUBIN TOTAL: 0.3 mg/dL (ref 0.3–1.2)
BUN: 8 mg/dL (ref 6–23)
CALCIUM: 8.8 mg/dL (ref 8.4–10.5)
CHLORIDE: 106 mmol/L (ref 96–112)
CO2: 24 mmol/L (ref 19–32)
Creatinine, Ser: 0.95 mg/dL (ref 0.50–1.35)
GFR calc Af Amer: >90 mL/min (ref >90)
GFR calc non Af Amer: >90 mL/min (ref >90)
GLUCOSE: 95 mg/dL (ref 70–99)
POTASSIUM: 3.6 mmol/L (ref 3.5–5.1)
Sodium: 141 mmol/L (ref 135–145)
Total Protein: 7.6 g/dL (ref 6.0–8.3)

## 2014-05-22 LAB — RAPID URINE DRUG SCREEN, HOSP PERFORMED
AMPHETAMINES: NOT DETECTED
BARBITURATES: NOT DETECTED
BENZODIAZEPINES: NOT DETECTED
Cocaine: NOT DETECTED
OPIATES: NOT DETECTED
Tetrahydrocannabinol: NOT DETECTED

## 2014-05-22 LAB — ACETAMINOPHEN LEVEL
ACETAMINOPHEN (TYLENOL), SERUM: 13.6 ug/mL (ref 10–30)
Acetaminophen (Tylenol), Serum: 33.8 ug/mL — ABNORMAL HIGH (ref 10–30)

## 2014-05-22 LAB — SALICYLATE LEVEL
Salicylate Lvl: 11.7 mg/dL (ref 2.8–20.0)
Salicylate Lvl: 11.5 mg/dL (ref 2.8–20.0)

## 2014-05-22 LAB — ETHANOL: Alcohol, Ethyl (B): 9 mg/dL (ref 0–9)

## 2014-05-22 MED ORDER — HYDROXYZINE HCL 25 MG PO TABS
25.0000 mg | ORAL_TABLET | Freq: Four times a day (QID) | ORAL | Status: DC | PRN
  Administered 2014-05-22 – 2014-05-23 (×2): 25 mg via ORAL
  Filled 2014-05-22 (×2): qty 1

## 2014-05-22 MED ORDER — FLUTICASONE PROPIONATE HFA 110 MCG/ACT IN AERO
2.0000 | INHALATION_SPRAY | Freq: Two times a day (BID) | RESPIRATORY_TRACT | Status: DC
  Administered 2014-05-23 – 2014-05-24 (×3): 2 via RESPIRATORY_TRACT
  Filled 2014-05-22: qty 12

## 2014-05-22 MED ORDER — TRAZODONE HCL 50 MG PO TABS: 50.0000 mg | ORAL_TABLET | Freq: Every evening | ORAL | Status: DC | PRN

## 2014-05-22 MED ORDER — LORAZEPAM 2 MG/ML IJ SOLN
2.0000 mg | Freq: Once | INTRAMUSCULAR | Status: AC
  Administered 2014-05-22: 2 mg via INTRAMUSCULAR
  Filled 2014-05-22: qty 1

## 2014-05-22 MED ORDER — QUETIAPINE FUMARATE 100 MG PO TABS: 100.0000 mg | ORAL_TABLET | Freq: Two times a day (BID) | ORAL | Status: DC | PRN

## 2014-05-22 MED ORDER — FLUVOXAMINE MALEATE 50 MG PO TABS
50.0000 mg | ORAL_TABLET | Freq: Every day | ORAL | Status: DC
  Administered 2014-05-22 – 2014-05-23 (×2): 50 mg via ORAL
  Filled 2014-05-22 (×3): qty 1

## 2014-05-22 MED ORDER — LORAZEPAM 2 MG/ML IJ SOLN
2.0000 mg | Freq: Once | INTRAMUSCULAR | Status: AC
  Administered 2014-05-22: 2 mg via INTRAVENOUS
  Filled 2014-05-22: qty 1

## 2014-05-22 MED ORDER — NICOTINE 14 MG/24HR TD PT24
14.0000 mg | MEDICATED_PATCH | Freq: Every day | TRANSDERMAL | Status: DC
  Administered 2014-05-22 – 2014-05-23 (×2): 14 mg via TRANSDERMAL
  Filled 2014-05-22 (×2): qty 1

## 2014-05-22 MED ORDER — DIPHENHYDRAMINE HCL 50 MG/ML IJ SOLN
50.0000 mg | Freq: Once | INTRAMUSCULAR | Status: AC
  Administered 2014-05-22: 50 mg via INTRAMUSCULAR

## 2014-05-22 MED ORDER — LORAZEPAM 1 MG PO TABS: 2.0000 mg | ORAL_TABLET | ORAL | Status: DC | PRN

## 2014-05-22 MED ORDER — ASENAPINE MALEATE 5 MG SL SUBL
5.0000 mg | SUBLINGUAL_TABLET | Freq: Two times a day (BID) | SUBLINGUAL | Status: DC
  Administered 2014-05-22 – 2014-05-24 (×4): 5 mg via SUBLINGUAL
  Filled 2014-05-22 (×5): qty 1

## 2014-05-22 MED ORDER — DIPHENHYDRAMINE HCL 50 MG/ML IJ SOLN
50.0000 mg | Freq: Once | INTRAMUSCULAR | Status: AC
  Administered 2014-05-22: 50 mg via INTRAMUSCULAR
  Filled 2014-05-22: qty 1

## 2014-05-22 MED ORDER — DIPHENHYDRAMINE HCL 50 MG/ML IJ SOLN
50.0000 mg | Freq: Once | INTRAMUSCULAR | Status: AC | PRN
  Filled 2014-05-22: qty 1

## 2014-05-22 MED ORDER — CHLORPROMAZINE HCL 25 MG PO TABS
25.0000 mg | ORAL_TABLET | Freq: Three times a day (TID) | ORAL | Status: DC
  Administered 2014-05-22 – 2014-05-23 (×3): 25 mg via ORAL
  Filled 2014-05-22 (×3): qty 1

## 2014-05-22 MED ORDER — ALBUTEROL SULFATE HFA 108 (90 BASE) MCG/ACT IN AERS: 1.0000 | INHALATION_SPRAY | Freq: Four times a day (QID) | RESPIRATORY_TRACT | Status: DC | PRN

## 2014-05-22 NOTE — ED Notes (Signed)
[facility]   Repeat Acetaminophen level and Salicylate level 1000 if detectable.  Monitor EKG changes.

## 2014-05-22 NOTE — ED Notes (Addendum)
Pt ambulatory w/o difficulty to room 36 from [facility].  Pt up to the bathroom on arrival. NP into see on arrival

## 2014-05-22 NOTE — ED Notes (Signed)
Pt. Still agitated but responding intermittently to attempts to redirection. Pt. Refuses to stay in his room but agrees to keep his volume down.

## 2014-05-22 NOTE — ED Notes (Signed)
Report received from Lizbeth BarkJanie Rambo RN. Pt. Alert and oriented in no distress.  Pt. Instructed to come to me with problems or concerns.Will continue to monitor for safety via security cameras and Q 15 minute checks.

## 2014-05-22 NOTE — ED Provider Notes (Signed)
CSN: [id number]     Arrival date & time 05/22/14  [id number] History   First MD Initiated Contact with Patient 05/22/14 0703     Chief Complaint  Patient presents with  . Alcohol Intoxication  . Suicidal     (Consider location/radiation/quality/duration/timing/severity/associated sxs/prior Treatment) HPI Comments: Patient arrive in police custody intoxicated. He called suicidal hotline and said he was suicidal after taking a bottle of "migraine pills" and drinking alcohol. He endorses suicidal ideation. Notably he was seen yesterday for the same complaints and discharged around midnight. He says he drank a cold 45 and some additional beer. He is not certain what medications he took. He says it was a bottle called migraine pills. He endorses a plan to kill himself with overdose. He denies any homicidal ideation or hallucinations. He denies any physical complaint. He says he vomited 3 times this morning and has some crampy abdominal pain.  The history is provided by the patient and the police. The history is limited by the condition of the patient.    Past Medical History  Diagnosis Date  . Asthma   . Bipolar 1 disorder   . ODD (oppositional defiant disorder)   . ADHD (attention deficit hyperactivity disorder)   . Schizophrenia   . Suicidal ideation   . Homicidal ideation   . Explosive personality disorder    History reviewed. No pertinent past surgical history. Family History  Problem Relation Age of Onset  . Asthma Mother   . Asthma Sister   . Thyroid disease     History  Substance Use Topics  . Smoking status: Never Smoker   . Smokeless tobacco: Not on file  . Alcohol Use: 0.0 oz/week    0 Standard drinks or equivalent per week    Review of Systems  HENT: Negative for congestion and rhinorrhea.   Respiratory: Negative for cough, chest tightness and shortness of breath.   Cardiovascular: Negative for chest pain.  Gastrointestinal: Positive for nausea, vomiting and abdominal  pain.  Genitourinary: Negative for dysuria and hematuria.  Musculoskeletal: Negative for myalgias and arthralgias.  Neurological: Negative for syncope.  Psychiatric/Behavioral: Positive for suicidal ideas, sleep disturbance and decreased concentration. The patient is nervous/anxious.   A complete 10 system review of systems was obtained and all systems are negative except as noted in the HPI and PMH.      Allergies  Carbamazepine; Geodon; Haldol; Strattera; Other; and Shellfish allergy  Home Medications   Prior to Admission medications   Medication Sig Start Date End Date Taking? Authorizing Provider  albuterol (PROVENTIL HFA;VENTOLIN HFA) 108 (90 BASE) MCG/ACT inhaler Inhale 1 puff into the lungs every 6 (six) hours as needed for wheezing or shortness of breath. 03/25/14  Yes Deepak Advani, MD  asenapine (SAPHRIS) 5 MG SUBL 24 hr tablet Place 5 mg under the tongue 2 (two) times daily.   Yes Historical Provider, MD  DiphenhydrAMINE HCl (BENADRYL PO) Take 1 tablet by mouth once.   Yes Historical Provider, MD  EPINEPHrine (EPIPEN 2-PAK IJ) Inject as directed.   Yes Historical Provider, MD  fluticasone (FLOVENT HFA) 110 MCG/ACT inhaler Inhale 2 puffs into the lungs every 12 (twelve) hours. 03/25/14  Yes Deepak Advani, MD  fluvoxaMINE (LUVOX) 50 MG tablet Take 50 mg by mouth at bedtime.   Yes Historical Provider, MD  ibuprofen (ADVIL,MOTRIN) 200 MG tablet Take 200 mg by mouth every 6 (six) hours as needed (harm himself).   Yes Historical Provider, MD  Pseudoephedrine-APAP-DM (DAYQUIL PO) Take 30 mLs  by mouth once.   Yes Historical Provider, MD  QUEtiapine (SEROQUEL) 100 MG tablet Take 100 mg by mouth 2 (two) times daily as needed (for mood).   Yes Historical Provider, MD  traZODone (DESYREL) 50 MG tablet Take 50 mg by mouth at bedtime as needed for sleep (sleep).    Yes Historical Provider, MD  benztropine (COGENTIN) 1 MG tablet Take 1 tablet (1 mg total) by mouth 2 (two) times daily. Patient  not taking: Reported on 05/18/2014 10/13/13   Beau Fanny, FNP  Vitamin D, Ergocalciferol, (DRISDOL) 50000 UNITS CAPS capsule Take 1 capsule (50,000 Units total) by mouth every 7 (seven) days. Patient not taking: Reported on 05/18/2014 03/30/14   Doris Cheadle, MD   BP 120/79 mmHg  Pulse 90  Temp(Src) 98.3 F (36.8 C) (Oral)  Resp 20  SpO2 100% Physical Exam  Constitutional: He is oriented to person, place, and time. He appears well-developed and well-nourished. No distress.  Intoxicated, argumentative  HENT:  Head: Normocephalic and atraumatic.  Mouth/Throat: Oropharynx is clear and moist. No oropharyngeal exudate.  Eyes: Conjunctivae and EOM are normal. Pupils are equal, round, and reactive to light.  Neck: Normal range of motion. Neck supple.  Cardiovascular: Normal rate, regular rhythm and normal heart sounds.   Pulmonary/Chest: Effort normal and breath sounds normal. No respiratory distress.  Abdominal: Soft. There is no tenderness. There is no rebound and no guarding.  Musculoskeletal: Normal range of motion. He exhibits no edema or tenderness.  Neurological: He is alert and oriented to person, place, and time. No cranial nerve deficit. He exhibits normal muscle tone. Coordination normal.  CN 2-12 intact, no ataxia on finger to nose, no nystagmus, 5/5 strength throughout, no pronator drift, Romberg negative, normal gait.   Skin: Skin is warm and dry.    ED Course  Procedures (including critical care time) Labs Review Labs Reviewed  ACETAMINOPHEN LEVEL - Abnormal; Notable for the following:    Acetaminophen (Tylenol), Serum 33.8 (*)    All other components within normal limits  URINALYSIS, ROUTINE W REFLEX MICROSCOPIC - Abnormal; Notable for the following:    APPearance CLOUDY (*)    All other components within normal limits  CBC WITH DIFFERENTIAL/PLATELET  COMPREHENSIVE METABOLIC PANEL  ETHANOL  SALICYLATE LEVEL  URINE RAPID DRUG SCREEN (HOSP PERFORMED)  SALICYLATE  LEVEL  ACETAMINOPHEN LEVEL  ACETAMINOPHEN LEVEL  SALICYLATE LEVEL    Imaging Review No results found.   EKG Interpretation   Date/Time:  Saturday May 22 2014 07:25:43 EDT Ventricular Rate:  95 PR Interval:  150 QRS Duration: 94 QT Interval:  344 QTC Calculation: 432 R Axis:   34 Text Interpretation:  Sinus rhythm LVH by voltage Baseline wander in  lead(s) V2 No significant change was found Confirmed by Manus Gunning  MD,  Pascale Maves ([id number]) on 05/22/2014 7:37:27 AM      MDM   Final diagnoses:  None   Patient well-known to the ED. History of bipolar disorder, schizophrenia, multiple recurrent visits for suicidal and homicidal thoughts presenting with suicidal ideation and overdose attempt. He is intoxicated and reportedly took a bottle of  excedrin "migraine" pills.  Patient is unreliable endorses suicidality. IVC paperwork completed.  Labs show acetaminophen and 33.8. Salicylate 11. Patient states he took medications around 5:50 AM. His history is unreliable. Will need repeat levels  Four-hour Tylenol level below treatment threshold. Salicylate level 11. Patient's vitals are stable. He appears medically clear for psychiatric evaluation. Holding orders placed.   Glynn Octave, MD 05/22/14  1536 

## 2014-05-22 NOTE — Consult Note (Signed)
[facility] Consult   Reason for Consult:  Overdose Referring Physician:  EDP Patient Identification: [name] MRN:  [id number] Principal Diagnosis:  Schizoaffective Disorder Diagnosis:   Patient Active Problem List   Diagnosis Date Noted  . Schizoaffective disorder, bipolar type [F25.0]     Priority: High  . Drug overdose, intentional [T50.902A] 10/09/2013    Priority: High  . Aggression [F60.89] 08/22/2013    Priority: High  . Asthma, chronic [J45.909] 03/25/2014  . Obesity, morbid [E66.01] 03/25/2014    Total Time spent with patient: 45 minutes  Subjective:   [name] is a 24 y.o. male patient needs psychiatric admission for suicide attempt.  HPI:  Patient presented to the ED after calling the Crisis Hotline and told them he was drinking while taking many pills.  Hotline called the police.  The patient had been told if he abused 911 one more time, he would be arrested so he called the Hotline.  He continues to manipulate and solicit attention from staff.  [name] endorses suicidal ideations but no homicidal ideations or hallucinations.  Denies alcohol/drug issues.   HPI Elements:   Location:  generalized. Quality:  acute. Severity:  mild. Timing:  intemittent. Duration:  brief. Context:  stressors.  Past Medical History:  Past Medical History  Diagnosis Date  . Asthma   . Bipolar 1 disorder   . ODD (oppositional defiant disorder)   . ADHD (attention deficit hyperactivity disorder)   . Schizophrenia   . Suicidal ideation   . Homicidal ideation   . Explosive personality disorder    History reviewed. No pertinent past surgical history. Family History:  Family History  Problem Relation Age of Onset  . Asthma Mother   . Asthma Sister   . Thyroid disease     Social History:  History  Alcohol Use  . 0.0 oz/week  . 0 Standard drinks or equivalent per week     History  Drug Use No    Comment: Patient denies     History   Social  History  . Marital Status: Single    Spouse Name: N/A  . Number of Children: N/A  . Years of Education: N/A   Social History Main Topics  . Smoking status: Never Smoker   . Smokeless tobacco: Not on file  . Alcohol Use: 0.0 oz/week    0 Standard drinks or equivalent per week  . Drug Use: No     Comment: Patient denies   . Sexual Activity: Not on file   Other Topics Concern  . None   Social History Narrative   Additional Social History:                          Allergies:   Allergies  Allergen Reactions  . Carbamazepine Anaphylaxis, Other (See Comments) and Cough    Cough up blood  . Geodon [Ziprasidone Hcl] Anaphylaxis and Other (See Comments)    " Causes my throat to close"  . Haldol [Haloperidol Lactate] Anaphylaxis and Other (See Comments)    Patient reports that he stopped taking this because of throat swelling.   Christianne Borrow [Atomoxetine] Anaphylaxis, Other (See Comments) and Cough    Cough up blood  . Other Nausea Only and Other (See Comments)    Apple Juice, stomach hurts and itchy throat   . Shellfish Allergy Swelling    Throat swelling    Labs:  Results for orders placed or performed during the hospital  encounter of 05/22/14 (from the past 48 hour(s))  Urine rapid drug screen (hosp performed)     Status: None   Collection Time: 05/22/14  6:58 AM  Result Value Ref Range   Opiates NONE DETECTED NONE DETECTED   Cocaine NONE DETECTED NONE DETECTED   Benzodiazepines NONE DETECTED NONE DETECTED    Comment: DELTA CHECK NOTED RESULT REPEATED AND VERIFIED    Amphetamines NONE DETECTED NONE DETECTED   Tetrahydrocannabinol NONE DETECTED NONE DETECTED   Barbiturates NONE DETECTED NONE DETECTED  Urinalysis, Routine w reflex microscopic     Status: Abnormal   Collection Time: 05/22/14  6:59 AM  Result Value Ref Range   Color, Urine YELLOW YELLOW   APPearance CLOUDY (A) CLEAR   Specific Gravity, Urine 1.006 1.005 - 1.030   pH 6.0 5.0 - 8.0   Glucose,  UA NEGATIVE NEGATIVE mg/dL   Hgb urine dipstick NEGATIVE NEGATIVE   Bilirubin Urine NEGATIVE NEGATIVE   Ketones, ur NEGATIVE NEGATIVE mg/dL   Protein, ur NEGATIVE NEGATIVE mg/dL   Urobilinogen, UA 0.2 0.0 - 1.0 mg/dL   Nitrite NEGATIVE NEGATIVE   Leukocytes, UA NEGATIVE NEGATIVE    Comment: MICROSCOPIC NOT DONE ON URINES WITH NEGATIVE PROTEIN, BLOOD, LEUKOCYTES, NITRITE, OR GLUCOSE <1000 mg/dL.  CBC with Differential/Platelet     Status: None   Collection Time: 05/22/14  7:12 AM  Result Value Ref Range   WBC 5.9 4.0 - 10.5 K/uL   RBC 5.43 4.22 - 5.81 MIL/uL   Hemoglobin 14.8 13.0 - 17.0 g/dL   HCT 43.9 39.0 - 52.0 %   MCV 80.8 78.0 - 100.0 fL   MCH 27.3 26.0 - 34.0 pg   MCHC 33.7 30.0 - 36.0 g/dL   RDW 13.7 11.5 - 15.5 %   Platelets 331 150 - 400 K/uL   Neutrophils Relative % 44 43 - 77 %   Neutro Abs 2.6 1.7 - 7.7 K/uL   Lymphocytes Relative 42 12 - 46 %   Lymphs Abs 2.5 0.7 - 4.0 K/uL   Monocytes Relative 10 3 - 12 %   Monocytes Absolute 0.6 0.1 - 1.0 K/uL   Eosinophils Relative 4 0 - 5 %   Eosinophils Absolute 0.2 0.0 - 0.7 K/uL   Basophils Relative 0 0 - 1 %   Basophils Absolute 0.0 0.0 - 0.1 K/uL  Comprehensive metabolic panel     Status: None   Collection Time: 05/22/14  7:12 AM  Result Value Ref Range   Sodium 141 135 - 145 mmol/L   Potassium 3.6 3.5 - 5.1 mmol/L   Chloride 106 96 - 112 mmol/L   CO2 24 19 - 32 mmol/L   Glucose, Bld 95 70 - 99 mg/dL   BUN 8 6 - 23 mg/dL   Creatinine, Ser 0.95 0.50 - 1.35 mg/dL   Calcium 8.8 8.4 - 10.5 mg/dL   Total Protein 7.6 6.0 - 8.3 g/dL   Albumin 4.3 3.5 - 5.2 g/dL   AST 30 0 - 37 U/L   ALT 28 0 - 53 U/L   Alkaline Phosphatase 73 39 - 117 U/L   Total Bilirubin 0.3 0.3 - 1.2 mg/dL   GFR calc non Af Amer >90 >90 mL/min   GFR calc Af Amer >90 >90 mL/min    Comment: (NOTE) The eGFR has been calculated using the CKD EPI equation. This calculation has not been validated in all clinical situations. eGFR's persistently <90  mL/min signify possible Chronic Kidney Disease.    Anion gap  11 5 - 15  Ethanol     Status: None   Collection Time: 05/22/14  7:45 AM  Result Value Ref Range   Alcohol, Ethyl (B) 9 0 - 9 mg/dL    Comment:        LOWEST DETECTABLE LIMIT FOR SERUM ALCOHOL IS 11 mg/dL FOR MEDICAL PURPOSES ONLY   Acetaminophen level     Status: Abnormal   Collection Time: 05/22/14  7:45 AM  Result Value Ref Range   Acetaminophen (Tylenol), Serum 33.8 (H) 10 - 30 ug/mL    Comment:        THERAPEUTIC CONCENTRATIONS VARY SIGNIFICANTLY. A RANGE OF 10-30 ug/mL MAY BE AN EFFECTIVE CONCENTRATION FOR MANY PATIENTS. HOWEVER, SOME ARE BEST TREATED AT CONCENTRATIONS OUTSIDE THIS RANGE. ACETAMINOPHEN CONCENTRATIONS >150 ug/mL AT 4 HOURS AFTER INGESTION AND >50 ug/mL AT 12 HOURS AFTER INGESTION ARE OFTEN ASSOCIATED WITH TOXIC REACTIONS.   Salicylate level     Status: None   Collection Time: 05/22/14  7:45 AM  Result Value Ref Range   Salicylate Lvl 51.8 2.8 - 84.1 mg/dL  Salicylate level     Status: None   Collection Time: 05/22/14 11:31 AM  Result Value Ref Range   Salicylate Lvl 66.0 2.8 - 20.0 mg/dL  Acetaminophen level     Status: None   Collection Time: 05/22/14 11:31 AM  Result Value Ref Range   Acetaminophen (Tylenol), Serum 13.6 10 - 30 ug/mL    Comment:        THERAPEUTIC CONCENTRATIONS VARY SIGNIFICANTLY. A RANGE OF 10-30 ug/mL MAY BE AN EFFECTIVE CONCENTRATION FOR MANY PATIENTS. HOWEVER, SOME ARE BEST TREATED AT CONCENTRATIONS OUTSIDE THIS RANGE. ACETAMINOPHEN CONCENTRATIONS >150 ug/mL AT 4 HOURS AFTER INGESTION AND >50 ug/mL AT 12 HOURS AFTER INGESTION ARE OFTEN ASSOCIATED WITH TOXIC REACTIONS.     Vitals: Blood pressure 114/65, pulse 80, temperature 98.1 F (36.7 C), temperature source Oral, resp. rate 18, SpO2 98 %.  Risk to Self: Suicidal Ideation: Yes-Currently Present Suicidal Intent: Yes-Currently Present Is patient at risk for suicide?: Yes Suicidal Plan?:  Yes-Currently Present Specify Current Suicidal Plan: alcohol and 15-30 pills Access to Means: Yes Specify Access to Suicidal Means: beer and pills What has been your use of drugs/alcohol within the last 12 months?: varies Triggers for Past Attempts: Unpredictable Intentional Self Injurious Behavior: None Risk to Others: Homicidal Ideation: No-Not Currently/Within Last 6 Months Thoughts of Harm to Others: No-Not Currently Present/Within Last 6 Months Current Homicidal Intent: No-Not Currently/Within Last 6 Months Current Homicidal Plan: No-Not Currently/Within Last 6 Months Access to Homicidal Means: No History of harm to others?: No Assessment of Violence: None Noted Does patient have access to weapons?: No Criminal Charges Pending?: No Does patient have a court date: No Prior Inpatient Therapy: Prior Inpatient Therapy: Yes Prior Therapy Dates: Multiple since age 26 Prior Therapy Facilty/Provider(s): [place], [facility], [facility] Reason for Treatment: SI, psychosis, aggression Prior Outpatient Therapy: Prior Outpatient Therapy: Yes Prior Therapy Dates: Ongoing Prior Therapy Facilty/Provider([facility]): [facility] Reason for Treatment: Med mgmt / [facility]  Current Facility-Administered Medications  Medication Dose Route Frequency Provider Last Rate Last Dose  . LORazepam (ATIVAN) tablet 2 mg  2 mg Oral Q4H PRN Marelyn Rouser       Current Outpatient Prescriptions  Medication Sig Dispense Refill  . albuterol (PROVENTIL HFA;VENTOLIN HFA) 108 (90 BASE) MCG/ACT inhaler Inhale 1 puff into the lungs every 6 (six) hours as needed for wheezing or shortness of breath. 18 g 3  . asenapine (SAPHRIS) 5  MG SUBL 24 hr tablet Place 5 mg under the tongue 2 (two) times daily.    . DiphenhydrAMINE HCl (BENADRYL PO) Take 1 tablet by mouth once.    Marland Kitchen EPINEPHrine (EPIPEN 2-PAK IJ) Inject as directed.    . fluticasone (FLOVENT HFA) 110 MCG/ACT inhaler Inhale 2 puffs into the lungs every 12 (twelve) hours. 1  Inhaler 12  . fluvoxaMINE (LUVOX) 50 MG tablet Take 50 mg by mouth at bedtime.    Marland Kitchen ibuprofen (ADVIL,MOTRIN) 200 MG tablet Take 200 mg by mouth every 6 (six) hours as needed (harm himself).    . Pseudoephedrine-APAP-DM (DAYQUIL PO) Take 30 mLs by mouth once.    Marland Kitchen QUEtiapine (SEROQUEL) 100 MG tablet Take 100 mg by mouth 2 (two) times daily as needed (for mood).    . traZODone (DESYREL) 50 MG tablet Take 50 mg by mouth at bedtime as needed for sleep (sleep).     . benztropine (COGENTIN) 1 MG tablet Take 1 tablet (1 mg total) by mouth 2 (two) times daily. (Patient not taking: Reported on 05/18/2014) 60 tablet 0  . Vitamin D, Ergocalciferol, (DRISDOL) 50000 UNITS CAPS capsule Take 1 capsule (50,000 Units total) by mouth every 7 (seven) days. (Patient not taking: Reported on 05/18/2014) 12 capsule 0    Musculoskeletal: Strength & Muscle Tone: within normal limits Gait & Station: normal Patient leans: N/A  Psychiatric Specialty Exam:     Blood pressure 114/65, pulse 80, temperature 98.1 F (36.7 C), temperature source Oral, resp. rate 18, SpO2 98 %.There is no weight on file to calculate BMI.  General Appearance: Casual  Eye Contact::  Good  Speech:  Normal Rate  Volume:  Normal  Mood:  Depressed  Affect:  Congruent  Thought Process:  Coherent  Orientation:  Full (Time, Place, and Person)  Thought Content:  WDL  Suicidal Thoughts:  Yes, with intent and plan  Homicidal Thoughts:  No  Memory:  Immediate;   Good Recent;   Good Remote;   Good  Judgement:  Fair  Insight:  Fair  Psychomotor Activity:  Normal  Concentration:  Good  Recall:  Good  Fund of Knowledge:Good  Language: Good  Akathisia:  No  Handed:  Right  AIMS (if indicated):     Assets:  Housing Leisure Time Physical Health Resilience Social Support  ADL's:  Intact  Cognition: WNL  Sleep:      Medical Decision Making: Review of Psycho-Social Stressors (1), Review or order clinical lab tests (1) and Review of  Medication Regimen & Side Effects (2)  Treatment Plan Summary: Plan:  Admit Disposition:  Admit to inpatient psychiatry for stabilization  Waylan Boga, PMH-NP 05/22/2014 12:30 PM Patient seen face-to-face for psychiatric evaluation, chart reviewed and case discussed with the physician extender and developed treatment plan. Reviewed the information documented and agree with the treatment plan. Corena Pilgrim, MD

## 2014-05-22 NOTE — ED Notes (Signed)
Up in hall talking to CSW

## 2014-05-22 NOTE — ED Notes (Signed)
Pt in hall talking w/ PD and this RN, argumentative, bed removed from room because pt threatened to "break my arm" with it.

## 2014-05-22 NOTE — ED Notes (Signed)
Pt calm and resting in stretcher with security at bedside.

## 2014-05-22 NOTE — ED Notes (Signed)
Bed: [id number] Expected date:  Expected time:  Means of arrival:  Comments: EMS 24 yo male SI/intoxicated

## 2014-05-22 NOTE — ED Notes (Addendum)
Pt angry that the NP had not talked to him and stated as he walked by the nurses station "I'll just kill myself."  Pt  Then went into his room and took the sheet and tried to wrap it around the TV cabinet.  Linens removed, from room.

## 2014-05-22 NOTE — ED Notes (Signed)
Pt reports he smokes daily and is requesting a nicotine patch

## 2014-05-22 NOTE — ED Notes (Signed)
Pt decided to eat lunch, tray given.  Pt in room, polite, cooperative

## 2014-05-22 NOTE — ED Notes (Signed)
Patient refused the lunch tray, attending RN notified.

## 2014-05-22 NOTE — ED Notes (Addendum)
Pt reports drank "a whole bottle of colt 45, a beer, and 20-30 Excedrin migraine pills" at 0550. Pt reports emesis with drinking. No visible signs of nausea/vomiting at present time. Pt ambulatory/stable gait and alert and oriented x4.

## 2014-05-22 NOTE — ED Notes (Signed)
Poison control called and updated w/ repeat levels (asa/tylenol) case closed

## 2014-05-22 NOTE — ED Notes (Signed)
Standing in door to room arguing w/ [employer] officer.

## 2014-05-22 NOTE — ED Notes (Signed)
[name] BridgeMartha given two patient belongings bags and purple duffle bag.

## 2014-05-22 NOTE — ED Notes (Signed)
Up in hall, calmer, easier to redirect

## 2014-05-22 NOTE — ED Notes (Addendum)
Pt. Noted in room. No complaints or concerns voiced. No distress or abnormal behavior noted. Will continue to monitor with security cameras. Q 15 minute rounds continue. 

## 2014-05-22 NOTE — ED Notes (Signed)
Pt told to keep monitor leads on.  Pt placed on monitor d/t tylenol levels being elevated.  Pt became upset yelling at Nurse Tech.  RN confronted patient and let him know that he had to keep the leads in place.  Pt continued yelling stating that "staff disrespected him".  Would not stop yelling.  Notified security to assist in getting pt more cooperative and quieter d/t other patients in the department.

## 2014-05-22 NOTE — ED Notes (Signed)
Pt. agitated talking in loud voice in hall in front of nurses station. Pt. Not responding well to redirection.

## 2014-05-22 NOTE — Progress Notes (Signed)
Patient did not have transportation home. CSW gave pt a taxi voucher in order to get home safely.   Trish MageBrittney Lerin Jech, LCSWA [phone] ED CSW 05/22/2014 12:23 AM

## 2014-05-22 NOTE — ED Notes (Signed)
Up to the bathroom 

## 2014-05-22 NOTE — ED Notes (Signed)
Catha NottinghamJamison NP aware orders revieved

## 2014-05-22 NOTE — ED Notes (Signed)
Pt. Noted in room. No complaints or concerns voiced. No distress or abnormal behavior noted. Will continue to monitor with security cameras. Q 15 minute rounds continue. Snack given.

## 2014-05-22 NOTE — ED Notes (Signed)
Pt up in room biting hand to remove a scab "I want it to get infected.  Attempted to redirect pt w/ difficlty

## 2014-05-22 NOTE — ED Notes (Signed)
Up tot he bathroom to shower and change scrubs 

## 2014-05-22 NOTE — ED Notes (Signed)
Pt. Noted in day room talking to another patient. No complaints or concerns voiced. No distress or abnormal behavior noted. Will continue to monitor with security cameras. Q 15 minute rounds continue.

## 2014-05-22 NOTE — BH Assessment (Signed)
Assessment Note  [name] is an 24 y.o. male. Patient was brought into the ED because of suicidal ideations with plan to overdose.  Patient continues to endorse suicidal ideations and he drank Colt 45 with 15-30 pills.  Patient reports current auditory hallucinations with commands to kill self.     Disposition pending.    Axis I: Schizoaffective Disorder, Bipolar typs Axis II: Deferred Axis III:  Past Medical History  Diagnosis Date  . Asthma   . Bipolar 1 disorder   . ODD (oppositional defiant disorder)   . ADHD (attention deficit hyperactivity disorder)   . Schizophrenia   . Suicidal ideation   . Homicidal ideation   . Explosive personality disorder    Axis IV: educational problems, housing problems, other psychosocial or environmental problems, problems related to social environment, problems with access to health care services and problems with primary support group Axis V: 51-60 moderate symptoms  Past Medical History:  Past Medical History  Diagnosis Date  . Asthma   . Bipolar 1 disorder   . ODD (oppositional defiant disorder)   . ADHD (attention deficit hyperactivity disorder)   . Schizophrenia   . Suicidal ideation   . Homicidal ideation   . Explosive personality disorder     History reviewed. No pertinent past surgical history.  Family History:  Family History  Problem Relation Age of Onset  . Asthma Mother   . Asthma Sister   . Thyroid disease      Social History:  reports that he has never smoked. He does not have any smokeless tobacco history on file. He reports that he drinks alcohol. He reports that he does not use illicit drugs.  Additional Social History:     CIWA: CIWA-Ar BP: 113/65 mmHg Pulse Rate: 89 COWS:    Allergies:  Allergies  Allergen Reactions  . Carbamazepine Anaphylaxis, Other (See Comments) and Cough    Cough up blood  . Geodon [Ziprasidone Hcl] Anaphylaxis and Other (See Comments)    " Causes my throat to close"  .  Haldol [Haloperidol Lactate] Anaphylaxis and Other (See Comments)    Patient reports that he stopped taking this because of throat swelling.   [name] [Atomoxetine] Anaphylaxis, Other (See Comments) and Cough    Cough up blood  . Other Nausea Only and Other (See Comments)    Apple Juice, stomach hurts and itchy throat   . Shellfish Allergy Swelling    Throat swelling    Home Medications:  (Not in a hospital admission)  OB/GYN Status:  No LMP for male patient.  General Assessment Data Location of Assessment: [facility] ACT Assessment: Yes Is this a Tele or Face-to-Face Assessment?: Face-to-Face Is this an Initial Assessment or a Re-assessment for this encounter?: Initial Assessment Living Arrangements: Parent Can pt return to current living arrangement?: Yes Admission Status: Involuntary Is patient capable of signing voluntary admission?: No Transfer from: Home Referral Source: Self/Family/Friend  Medical Screening Exam [facility] Walk-in ONLY) Medical Exam completed: Yes  [facility] Crisis Care Plan Living Arrangements: Parent Name of Psychiatrist: [facility] Name of Therapist: [facility]  Education Status Is patient currently in school?: No  Risk to self with the past 6 months Suicidal Ideation: Yes-Currently Present Suicidal Intent: Yes-Currently Present Is patient at risk for suicide?: Yes Suicidal Plan?: Yes-Currently Present Specify Current Suicidal Plan: alcohol and 15-30 pills Access to Means: Yes Specify Access to Suicidal Means: beer and pills What has been your use of drugs/alcohol within the last 12 months?:  varies Previous Attempts/Gestures: Yes Triggers for Past Attempts: Unpredictable Intentional Self Injurious Behavior: None Family Suicide History: Unknown Recent stressful life event(s): Other (Comment) (unknown) Persecutory voices/beliefs?: Yes Depression: Yes Depression Symptoms: Feeling worthless/self pity, Feeling  angry/irritable (hopelessness) Substance abuse history and/or treatment for substance abuse?: Yes  Risk to Others within the past 6 months Homicidal Ideation: No-Not Currently/Within Last 6 Months Thoughts of Harm to Others: No-Not Currently Present/Within Last 6 Months Current Homicidal Intent: No-Not Currently/Within Last 6 Months Current Homicidal Plan: No-Not Currently/Within Last 6 Months Access to Homicidal Means: No History of harm to others?: No Assessment of Violence: None Noted Does patient have access to weapons?: No Criminal Charges Pending?: No Does patient have a court date: No  Psychosis Hallucinations: Auditory, Visual, With command Delusions: Unspecified  Mental Status Report Appearance/Hygiene: In hospital gown Eye Contact: Good Motor Activity: Freedom of movement Speech: Logical/coherent Level of Consciousness: Alert Mood: Anxious Affect: Blunted Anxiety Level: Minimal Judgement: Impaired Orientation: Person, Place, Time, Situation Obsessive Compulsive Thoughts/Behaviors: None  Cognitive Functioning Concentration: Fair Memory: Recent Intact, Remote Intact IQ: Below Average Level of Function: unknown at this time Insight: Poor Impulse Control: Poor Appetite: Fair Sleep: No Change Vegetative Symptoms: None  ADLScreening [facility] Assessment [facility]) Patient's cognitive ability adequate to safely complete daily activities?: Yes Patient able to express need for assistance with ADLs?: Yes Independently performs ADLs?: Yes (appropriate for developmental age)  Prior Inpatient Therapy Prior Inpatient Therapy: Yes Prior Therapy Dates: Multiple since age 24 Prior Therapy Facilty/Provider(s): [facility], [facility], [facility] Reason for Treatment: SI, psychosis, aggression  Prior Outpatient Therapy Prior Outpatient Therapy: Yes Prior Therapy Dates: Ongoing Prior Therapy Facilty/Provider(s): [facility] Reason for Treatment: Med mgmt / [facility]  ADL  Screening (condition at time of admission) Patient's cognitive ability adequate to safely complete daily activities?: Yes Patient able to express need for assistance with ADLs?: Yes Independently performs ADLs?: Yes (appropriate for developmental age)             Advance Directives (For Healthcare) Does patient have an advance directive?: No Would patient like information on creating an advanced directive?: No - patient declined information    Additional Information 1:1 In Past 12 Months?: No CIRT Risk: No Elopement Risk: No Does patient have medical clearance?: Yes     Disposition:  Disposition Initial Assessment Completed for this Encounter: Yes Disposition of Patient: Other dispositions Other disposition(s): Other (Comment) (pending)  On Site Evaluation by:   Reviewed with Physician:    Maryelizabeth Rowanorbett, Kathryn Cosby A 05/22/2014 9:04 AM

## 2014-05-22 NOTE — ED Notes (Signed)
Security and [employer] at bedside. 

## 2014-05-22 NOTE — ED Notes (Addendum)
Per MD repeat acetaminophen and salicylate at 1130.  Have monitored and blood levels listed above drawn in TCU.

## 2014-05-22 NOTE — ED Notes (Signed)
Patient arrives by EMS.  Patient states he does not want to be here and the "cops forced me to come here".  Patient declining all treatment at this time.  Patient is ambulatoryand is in no distress.

## 2014-05-23 MED ORDER — QUETIAPINE FUMARATE 100 MG PO TABS
100.0000 mg | ORAL_TABLET | Freq: Every evening | ORAL | Status: DC | PRN
  Administered 2014-05-23: 100 mg via ORAL
  Filled 2014-05-23: qty 1

## 2014-05-23 MED ORDER — CHLORPROMAZINE HCL 25 MG PO TABS
50.0000 mg | ORAL_TABLET | Freq: Three times a day (TID) | ORAL | Status: DC
  Administered 2014-05-23 – 2014-05-24 (×3): 50 mg via ORAL
  Filled 2014-05-23 (×3): qty 2

## 2014-05-23 MED ORDER — IBUPROFEN 200 MG PO TABS
400.0000 mg | ORAL_TABLET | Freq: Once | ORAL | Status: AC
  Administered 2014-05-23: 400 mg via ORAL
  Filled 2014-05-23: qty 2

## 2014-05-23 NOTE — ED Notes (Signed)
Pt. Noted sleeping in room. No complaints or concerns voiced. No distress or abnormal behavior noted. Will continue to monitor with security cameras. Q 15 minute rounds continue. 

## 2014-05-23 NOTE — ED Notes (Signed)
Dr a and jamison np into see 

## 2014-05-23 NOTE — ED Notes (Signed)
Up tot he bathroom to shower and change scrubs 

## 2014-05-23 NOTE — ED Notes (Addendum)
Pt. Walking in hall. Snack and soft drink given.

## 2014-05-23 NOTE — BH Assessment (Signed)
[name], [facility], confirms acute adult unit is currently at capacity. Contacted the following facilities for placement:  BED AVAILABLE, FAXED CLINICAL INFORMATION: [facility], per [facility], per [facility], per [place]  AT CAPACITY: [facility], per [name], per [facility], per [facility], per [facility], per [name], per [facility] [place], per [facility], per [facility], per [facility], per [facility] [place], per [facility], per [facility], per [facility], per [facility]  NO RESPONSE: [name], WisconsinLPC, [facility] Triage Specialist [phone]

## 2014-05-23 NOTE — Consult Note (Signed)
[place] Psychiatry Consult   Reason for Consult:  Overdose Referring Physician:  EDP Patient Identification: [name] MRN:  [id number] Principal Diagnosis:  Schizoaffective Disorder Diagnosis:   Patient Active Problem List   Diagnosis Date Noted  . Schizoaffective disorder, bipolar type [F25.0]     Priority: High  . Drug overdose, intentional [T50.902A] 10/09/2013    Priority: High  . Aggression [F60.89] 08/22/2013    Priority: High  . Asthma, chronic [J45.909] 03/25/2014  . Obesity, morbid [E66.01] 03/25/2014    Total Time spent with patient: 30 minutes  Subjective:   [name] is a 24 y.o. male patient needs psychiatric admission for suicide attempt.  HPI:  Patient remained labile with one minute wanting to discharge and another threatening to hurt himself most of yesterday.  Today, he is much calmer and only voices that he wants to leave.  Stability of mood needs to be of a longer duration prior to discharge. HPI Elements:   Location:  generalized. Quality:  acute. Severity:  mild. Timing:  intemittent. Duration:  brief. Context:  stressors.  Past Medical History:  Past Medical History  Diagnosis Date  . Asthma   . Bipolar 1 disorder   . ODD (oppositional defiant disorder)   . ADHD (attention deficit hyperactivity disorder)   . Schizophrenia   . Suicidal ideation   . Homicidal ideation   . Explosive personality disorder    History reviewed. No pertinent past surgical history. Family History:  Family History  Problem Relation Age of Onset  . Asthma Mother   . Asthma Sister   . Thyroid disease     Social History:  History  Alcohol Use  . 0.0 oz/week  . 0 Standard drinks or equivalent per week     History  Drug Use No    Comment: Patient denies     History   Social History  . Marital Status: Single    Spouse Name: N/A  . Number of Children: N/A  . Years of Education: N/A   Social History Main Topics  . Smoking status: Never  Smoker   . Smokeless tobacco: Not on file  . Alcohol Use: 0.0 oz/week    0 Standard drinks or equivalent per week  . Drug Use: No     Comment: Patient denies   . Sexual Activity: Not on file   Other Topics Concern  . None   Social History Narrative   Additional Social History:                          Allergies:   Allergies  Allergen Reactions  . Carbamazepine Anaphylaxis, Other (See Comments) and Cough    Cough up blood  . Geodon [Ziprasidone Hcl] Anaphylaxis and Other (See Comments)    " Causes my throat to close"  . Haldol [Haloperidol Lactate] Anaphylaxis and Other (See Comments)    Patient reports that he stopped taking this because of throat swelling.   Christianne Borrow [Atomoxetine] Anaphylaxis, Other (See Comments) and Cough    Cough up blood  . Other Nausea Only and Other (See Comments)    Apple Juice, stomach hurts and itchy throat   . Shellfish Allergy Swelling    Throat swelling    Labs:  Results for orders placed or performed during the hospital encounter of 05/22/14 (from the past 48 hour(s))  Urine rapid drug screen (hosp performed)     Status: None   Collection Time: 05/22/14  6:58  AM  Result Value Ref Range   Opiates NONE DETECTED NONE DETECTED   Cocaine NONE DETECTED NONE DETECTED   Benzodiazepines NONE DETECTED NONE DETECTED    Comment: DELTA CHECK NOTED RESULT REPEATED AND VERIFIED    Amphetamines NONE DETECTED NONE DETECTED   Tetrahydrocannabinol NONE DETECTED NONE DETECTED   Barbiturates NONE DETECTED NONE DETECTED  Urinalysis, Routine w reflex microscopic     Status: Abnormal   Collection Time: 05/22/14  6:59 AM  Result Value Ref Range   Color, Urine YELLOW YELLOW   APPearance CLOUDY (A) CLEAR   Specific Gravity, Urine 1.006 1.005 - 1.030   pH 6.0 5.0 - 8.0   Glucose, UA NEGATIVE NEGATIVE mg/dL   Hgb urine dipstick NEGATIVE NEGATIVE   Bilirubin Urine NEGATIVE NEGATIVE   Ketones, ur NEGATIVE NEGATIVE mg/dL   Protein, ur NEGATIVE  NEGATIVE mg/dL   Urobilinogen, UA 0.2 0.0 - 1.0 mg/dL   Nitrite NEGATIVE NEGATIVE   Leukocytes, UA NEGATIVE NEGATIVE    Comment: MICROSCOPIC NOT DONE ON URINES WITH NEGATIVE PROTEIN, BLOOD, LEUKOCYTES, NITRITE, OR GLUCOSE <1000 mg/dL.  CBC with Differential/Platelet     Status: None   Collection Time: 05/22/14  7:12 AM  Result Value Ref Range   WBC 5.9 4.0 - 10.5 K/uL   RBC 5.43 4.22 - 5.81 MIL/uL   Hemoglobin 14.8 13.0 - 17.0 g/dL   HCT 43.9 39.0 - 52.0 %   MCV 80.8 78.0 - 100.0 fL   MCH 27.3 26.0 - 34.0 pg   MCHC 33.7 30.0 - 36.0 g/dL   RDW 13.7 11.5 - 15.5 %   Platelets 331 150 - 400 K/uL   Neutrophils Relative % 44 43 - 77 %   Neutro Abs 2.6 1.7 - 7.7 K/uL   Lymphocytes Relative 42 12 - 46 %   Lymphs Abs 2.5 0.7 - 4.0 K/uL   Monocytes Relative 10 3 - 12 %   Monocytes Absolute 0.6 0.1 - 1.0 K/uL   Eosinophils Relative 4 0 - 5 %   Eosinophils Absolute 0.2 0.0 - 0.7 K/uL   Basophils Relative 0 0 - 1 %   Basophils Absolute 0.0 0.0 - 0.1 K/uL  Comprehensive metabolic panel     Status: None   Collection Time: 05/22/14  7:12 AM  Result Value Ref Range   Sodium 141 135 - 145 mmol/L   Potassium 3.6 3.5 - 5.1 mmol/L   Chloride 106 96 - 112 mmol/L   CO2 24 19 - 32 mmol/L   Glucose, Bld 95 70 - 99 mg/dL   BUN 8 6 - 23 mg/dL   Creatinine, Ser 0.95 0.50 - 1.35 mg/dL   Calcium 8.8 8.4 - 10.5 mg/dL   Total Protein 7.6 6.0 - 8.3 g/dL   Albumin 4.3 3.5 - 5.2 g/dL   AST 30 0 - 37 U/L   ALT 28 0 - 53 U/L   Alkaline Phosphatase 73 39 - 117 U/L   Total Bilirubin 0.3 0.3 - 1.2 mg/dL   GFR calc non Af Amer >90 >90 mL/min   GFR calc Af Amer >90 >90 mL/min    Comment: (NOTE) The eGFR has been calculated using the CKD EPI equation. This calculation has not been validated in all clinical situations. eGFR's persistently <90 mL/min signify possible Chronic Kidney Disease.    Anion gap 11 5 - 15  Ethanol     Status: None   Collection Time: 05/22/14  7:45 AM  Result Value Ref Range    Alcohol,  Ethyl (B) 9 0 - 9 mg/dL    Comment:        LOWEST DETECTABLE LIMIT FOR SERUM ALCOHOL IS 11 mg/dL FOR MEDICAL PURPOSES ONLY   Acetaminophen level     Status: Abnormal   Collection Time: 05/22/14  7:45 AM  Result Value Ref Range   Acetaminophen (Tylenol), Serum 33.8 (H) 10 - 30 ug/mL    Comment:        THERAPEUTIC CONCENTRATIONS VARY SIGNIFICANTLY. A RANGE OF 10-30 ug/mL MAY BE AN EFFECTIVE CONCENTRATION FOR MANY PATIENTS. HOWEVER, SOME ARE BEST TREATED AT CONCENTRATIONS OUTSIDE THIS RANGE. ACETAMINOPHEN CONCENTRATIONS >150 ug/mL AT 4 HOURS AFTER INGESTION AND >50 ug/mL AT 12 HOURS AFTER INGESTION ARE OFTEN ASSOCIATED WITH TOXIC REACTIONS.   Salicylate level     Status: None   Collection Time: 05/22/14  7:45 AM  Result Value Ref Range   Salicylate Lvl 48.1 2.8 - 85.6 mg/dL  Salicylate level     Status: None   Collection Time: 05/22/14 11:31 AM  Result Value Ref Range   Salicylate Lvl 31.4 2.8 - 20.0 mg/dL  Acetaminophen level     Status: None   Collection Time: 05/22/14 11:31 AM  Result Value Ref Range   Acetaminophen (Tylenol), Serum 13.6 10 - 30 ug/mL    Comment:        THERAPEUTIC CONCENTRATIONS VARY SIGNIFICANTLY. A RANGE OF 10-30 ug/mL MAY BE AN EFFECTIVE CONCENTRATION FOR MANY PATIENTS. HOWEVER, SOME ARE BEST TREATED AT CONCENTRATIONS OUTSIDE THIS RANGE. ACETAMINOPHEN CONCENTRATIONS >150 ug/mL AT 4 HOURS AFTER INGESTION AND >50 ug/mL AT 12 HOURS AFTER INGESTION ARE OFTEN ASSOCIATED WITH TOXIC REACTIONS.     Vitals: Blood pressure 124/66, pulse 89, temperature 98 F (36.7 C), temperature source Oral, resp. rate 18, SpO2 100 %.  Risk to Self: Suicidal Ideation: Yes-Currently Present Suicidal Intent: Yes-Currently Present Is patient at risk for suicide?: Yes Suicidal Plan?: Yes-Currently Present Specify Current Suicidal Plan: alcohol and 15-30 pills Access to Means: Yes Specify Access to Suicidal Means: beer and pills What has been your use of  drugs/alcohol within the last 12 months?: varies Triggers for Past Attempts: Unpredictable Intentional Self Injurious Behavior: None Risk to Others: Homicidal Ideation: No-Not Currently/Within Last 6 Months Thoughts of Harm to Others: No-Not Currently Present/Within Last 6 Months Current Homicidal Intent: No-Not Currently/Within Last 6 Months Current Homicidal Plan: No-Not Currently/Within Last 6 Months Access to Homicidal Means: No History of harm to others?: No Assessment of Violence: None Noted Does patient have access to weapons?: No Criminal Charges Pending?: No Does patient have a court date: No Prior Inpatient Therapy: Prior Inpatient Therapy: Yes Prior Therapy Dates: Multiple since age 42 Prior Therapy Facilty/Provider(s): [place], [facility], [facility] Reason for Treatment: SI, psychosis, aggression Prior Outpatient Therapy: Prior Outpatient Therapy: Yes Prior Therapy Dates: Ongoing Prior Therapy Facilty/Provider([facility]): [facility] Reason for Treatment: Med mgmt / [facility]  Current Facility-Administered Medications  Medication Dose Route Frequency Provider Last Rate Last Dose  . albuterol (PROVENTIL HFA;VENTOLIN HFA) 108 (90 BASE) MCG/ACT inhaler 1 puff  1 puff Inhalation Q6H PRN Ezequiel Essex, MD      . asenapine (SAPHRIS) sublingual tablet 5 mg  5 mg Sublingual BID Ezequiel Essex, MD   5 mg at 05/23/14 0914  . chlorproMAZINE (THORAZINE) tablet 50 mg  50 mg Oral TID Patrecia Pour, NP   50 mg at 05/23/14 1544  . fluticasone (FLOVENT HFA) 110 MCG/ACT inhaler 2 puff  2 puff Inhalation BID Ezequiel Essex, MD   2 puff  at 05/23/14 0814  . fluvoxaMINE (LUVOX) tablet 50 mg  50 mg Oral QHS Ezequiel Essex, MD   50 mg at 05/22/14 2207  . hydrOXYzine (ATARAX/VISTARIL) tablet 25 mg  25 mg Oral Q6H PRN Patrecia Pour, NP   25 mg at 05/22/14 1658  . nicotine (NICODERM CQ - dosed in mg/24 hours) patch 14 mg  14 mg Transdermal Daily Patrecia Pour, NP   14 mg at 05/23/14 1042  . traZODone  (DESYREL) tablet 50 mg  50 mg Oral QHS PRN Ezequiel Essex, MD       Current Outpatient Prescriptions  Medication Sig Dispense Refill  . albuterol (PROVENTIL HFA;VENTOLIN HFA) 108 (90 BASE) MCG/ACT inhaler Inhale 1 puff into the lungs every 6 (six) hours as needed for wheezing or shortness of breath. 18 g 3  . asenapine (SAPHRIS) 5 MG SUBL 24 hr tablet Place 5 mg under the tongue 2 (two) times daily.    . DiphenhydrAMINE HCl (BENADRYL PO) Take 1 tablet by mouth once.    Marland Kitchen EPINEPHrine (EPIPEN 2-PAK IJ) Inject as directed.    . fluticasone (FLOVENT HFA) 110 MCG/ACT inhaler Inhale 2 puffs into the lungs every 12 (twelve) hours. 1 Inhaler 12  . fluvoxaMINE (LUVOX) 50 MG tablet Take 50 mg by mouth at bedtime.    Marland Kitchen ibuprofen (ADVIL,MOTRIN) 200 MG tablet Take 200 mg by mouth every 6 (six) hours as needed (harm himself).    . Pseudoephedrine-APAP-DM (DAYQUIL PO) Take 30 mLs by mouth once.    Marland Kitchen QUEtiapine (SEROQUEL) 100 MG tablet Take 100 mg by mouth 2 (two) times daily as needed (for mood).    . traZODone (DESYREL) 50 MG tablet Take 50 mg by mouth at bedtime as needed for sleep (sleep).     . benztropine (COGENTIN) 1 MG tablet Take 1 tablet (1 mg total) by mouth 2 (two) times daily. (Patient not taking: Reported on 05/18/2014) 60 tablet 0  . Vitamin D, Ergocalciferol, (DRISDOL) 50000 UNITS CAPS capsule Take 1 capsule (50,000 Units total) by mouth every 7 (seven) days. (Patient not taking: Reported on 05/18/2014) 12 capsule 0    Musculoskeletal: Strength & Muscle Tone: within normal limits Gait & Station: normal Patient leans: N/A  Psychiatric Specialty Exam:     Blood pressure 124/66, pulse 89, temperature 98 F (36.7 C), temperature source Oral, resp. rate 18, SpO2 100 %.There is no weight on file to calculate BMI.  General Appearance: Casual  Eye Contact::  Good  Speech:  Normal Rate  Volume:  Normal  Mood:  Depressed  Affect:  Congruent  Thought Process:  Coherent  Orientation:  Full  (Time, Place, and Person)  Thought Content:  WDL  Suicidal Thoughts:  No  Homicidal Thoughts:  No  Memory:  Immediate;   Good Recent;   Good Remote;   Good  Judgement:  Fair  Insight:  Fair  Psychomotor Activity:  Normal  Concentration:  Good  Recall:  Good  Fund of Knowledge:Good  Language: Good  Akathisia:  No  Handed:  Right  AIMS (if indicated):     Assets:  Housing Leisure Time Physical Health Resilience Social Support  ADL's:  Intact  Cognition: WNL  Sleep:      Medical Decision Making: Review of Psycho-Social Stressors (1), Review or order clinical lab tests (1) and Review of Medication Regimen & Side Effects (2)  Treatment Plan Summary: Plan:  Admit Disposition:  Admit to inpatient psychiatry for stabilization, unless stabilized in ED  LORD, JAMISON,  PMH-NP 05/23/2014 4:32 PM Patient seen face-to-face for psychiatric evaluation, chart reviewed and case discussed with the physician extender and developed treatment plan. Reviewed the information documented and agree with the treatment plan. Corena Pilgrim, MD

## 2014-05-23 NOTE — ED Notes (Signed)
Up to the bathroom ambulatory w/o difficulty 

## 2014-05-23 NOTE — ED Notes (Signed)
Pt. Noted in hall. No complaints or concerns voiced. No distress or abnormal behavior noted. Will continue to monitor with security cameras. Q 15 minute rounds continue. 

## 2014-05-23 NOTE — ED Notes (Signed)
Up to the bathroom 

## 2014-05-23 NOTE — ED Notes (Signed)
Snack given.

## 2014-05-23 NOTE — ED Notes (Signed)
In the hall talking to CSW

## 2014-05-23 NOTE — ED Notes (Signed)
Report received from Lizbeth BarkJanie Rambo RN. Pt. Alert and oriented in no distress denies SI, HI, AVH.  Pt. Instructed to come to me with problems or concerns.Will continue to monitor for safety via security cameras and Q 15 minute checks.

## 2014-05-24 DIAGNOSIS — T1491 Suicide attempt: Secondary | ICD-10-CM | POA: Diagnosis not present

## 2014-05-24 DIAGNOSIS — T50902A Poisoning by unspecified drugs, medicaments and biological substances, intentional self-harm, initial encounter: Secondary | ICD-10-CM

## 2014-05-24 DIAGNOSIS — F250 Schizoaffective disorder, bipolar type: Secondary | ICD-10-CM | POA: Diagnosis not present

## 2014-05-24 NOTE — ED Notes (Signed)
Pt. Noted resting in room. No complaints or concerns voiced. No distress or abnormal behavior noted. Will continue to monitor with security cameras. Q 15 minute rounds continue. 

## 2014-05-24 NOTE — ED Notes (Signed)
Pt. Noted sleeping in room. No complaints or concerns voiced. No distress or abnormal behavior noted. Will continue to monitor with security cameras. Q 15 minute rounds continue. 

## 2014-05-24 NOTE — [facility] Suicide Risk Assessment (Signed)
[facility]   Demographic Factors:  24 year old who is a constant user of the ED  Total Time spent with patient: 30 minutes  Musculoskeletal: Strength & Muscle Tone: within normal limits Gait & Station: normal Patient leans: N/A  Psychiatric Specialty Exam: Physical Exam  ROS  Blood pressure 104/68, pulse 77, temperature 98 F (36.7 C), temperature source Oral, resp. rate 18, SpO2 100 %.There is no weight on file to calculate BMI.  General Appearance: Casual  Eye Contact::  Good  Speech:  Clear and Coherent409  Volume:  Normal  Mood:  Euthymic  Affect:  Congruent  Thought Process:  Coherent  Orientation:  Full (Time, Place, and Person)  Thought Content:  Negative  Suicidal Thoughts:  No  Homicidal Thoughts:  No  Memory:  Immediate;   Good Recent;   Good Remote;   Good  Judgement:  Intact  Insight:  Shallow  Psychomotor Activity:  Normal  Concentration:  Good  Recall:  Good  Fund of Knowledge:Good  Language: Good  Akathisia:  Negative  Handed:  Right  AIMS (if indicated):     Assets:  Communication Skills Housing Physical Health  Sleep:     Cognition: WNL  ADL's:  Intact      Has this patient used any form of tobacco in the last 30 days? (Cigarettes, Smokeless Tobacco, Cigars, and/or Pipes) N/A  Mental Status Per Nursing Assessment::   On Admission:     Current Mental Status by Physician: NA  Loss Factors: NA  Historical Factors: Prior suicide attempts  Risk Reduction Factors:   Living with another person, especially a relative  Continued Clinical Symptoms:  Previous Psychiatric Diagnoses and Treatments  Cognitive Features That Contribute To Risk:  Closed-mindedness    Suicide Risk:  Minimal: No identifiable suicidal ideation.  Patients presenting with no risk factors but with morbid ruminations; may be classified as minimal risk based on the severity of the depressive symptoms  Principal Problem: Schizoaffective  disorder, bipolar type Discharge Diagnoses:  Patient Active Problem List   Diagnosis Date Noted  . Schizoaffective disorder, bipolar type [F25.0]   . Asthma, chronic [J45.909] 03/25/2014  . Obesity, morbid [E66.01] 03/25/2014  . Drug overdose, intentional [T50.902A] 10/09/2013  . Aggression [F60.89] 08/22/2013      Plan Of Care/Follow-up recommendations:  Activity:  resume usual activity Diet:  resume usual diet  Is patient on multiple antipsychotic therapies at discharge:  No   Has Patient had three or more failed trials of antipsychotic monotherapy by history:  No  Recommended Plan for Multiple Antipsychotic Therapies: NA    TAYLOR,GERALD D 05/24/2014, 11:50 AM

## 2014-05-24 NOTE — ED Notes (Signed)
Patient discharged to home.  Denies thoughts of harm to self or others.  All belonging returned and signed for.  Left the unit ambulatory.

## 2014-05-24 NOTE — [facility] Assessment (Signed)
[facility]  Per Carolanne GrumblingGerald Taylor, MD and Dahlia ByesJosephine Onuoha, NP, this pt does not require psychiatric hospitalization at this time.  [facility].  He has a previously scheduled intake appointment with them tomorrow (Tuesday, 05/24/2014).  Pt's discharge instructions advise him to keep this appointment.  Pt's nurse, Dawnaly, has been notified.  Doylene Canninghomas Miro Balderson, MA Triage Specialist 05/24/2014 @ 09:46

## 2014-05-24 NOTE — Discharge Instructions (Signed)
For your ongoing behavioral health needs, you are advised to keep your previously scheduled intake appointment with the [facility]:       [facility]      [address]      [place], [place] [address]       [phone]

## 2014-05-24 NOTE — Consult Note (Signed)
[facility] Face-to-Face Psychiatry Consult   Reason for Consult:  Overdose Referring Physician:  EDP Patient Identification: [name] MRN:  [id number] Principal Diagnosis:  Schizoaffective Disorder Diagnosis:   Patient Active Problem List   Diagnosis Date Noted  . Schizoaffective disorder, bipolar type [F25.0]   . Asthma, chronic [J45.909] 03/25/2014  . Obesity, morbid [E66.01] 03/25/2014  . Drug overdose, intentional [T50.902A] 10/09/2013  . Aggression [F60.89] 08/22/2013    Total Time spent with patient: 30 minutes  Subjective:   [name] is a 24 y.o. male patient needs psychiatric admission for suicide attempt.  HPI:  Patient remained labile with one minute wanting to discharge and another threatening to hurt himself most of yesterday.  Today, he is much calmer and only voices that he wants to leave.  Stability of mood needs to be of a longer duration prior to discharge. HPI Elements:   Location:  generalized. Quality:  acute. Severity:  mild. Timing:  intemittent. Duration:  brief. Context:  stressors.  Past Medical History:  Past Medical History  Diagnosis Date  . Asthma   . Bipolar 1 disorder   . ODD (oppositional defiant disorder)   . ADHD (attention deficit hyperactivity disorder)   . Schizophrenia   . Suicidal ideation   . Homicidal ideation   . Explosive personality disorder    History reviewed. No pertinent past surgical history. Family History:  Family History  Problem Relation Age of Onset  . Asthma Mother   . Asthma Sister   . Thyroid disease     Social History:  History  Alcohol Use  . 0.0 oz/week  . 0 Standard drinks or equivalent per week     History  Drug Use No    Comment: Patient denies     History   Social History  . Marital Status: Single    Spouse Name: N/A  . Number of Children: N/A  . Years of Education: N/A   Social History Main Topics  . Smoking status: Never Smoker   . Smokeless tobacco: Not on file  . Alcohol  Use: 0.0 oz/week    0 Standard drinks or equivalent per week  . Drug Use: No     Comment: Patient denies   . Sexual Activity: Not on file   Other Topics Concern  . None   Social History Narrative   Additional Social History:                          Allergies:   Allergies  Allergen Reactions  . Carbamazepine Anaphylaxis, Other (See Comments) and Cough    Cough up blood  . Geodon [Ziprasidone Hcl] Anaphylaxis and Other (See Comments)    " Causes my throat to close"  . Haldol [Haloperidol Lactate] Anaphylaxis and Other (See Comments)    Patient reports that he stopped taking this because of throat swelling.   [name] [Atomoxetine] Anaphylaxis, Other (See Comments) and Cough    Cough up blood  . Other Nausea Only and Other (See Comments)    Apple Juice, stomach hurts and itchy throat   . Shellfish Allergy Swelling    Throat swelling    Labs:  No results found for this or any previous visit (from the past 48 hour(s)).  Vitals: Blood pressure 104/68, pulse 77, temperature 98 F (36.7 C), temperature source Oral, resp. rate 18, SpO2 100 %.  Risk to Self: denies any threats to kill self Risk to Others: Homicidal Ideation: No-Not Currently/Within  Last 6 Months Thoughts of Harm to Others: No-Not Currently Present/Within Last 6 Months Current Homicidal Intent: No-Not Currently/Within Last 6 Months Current Homicidal Plan: No-Not Currently/Within Last 6 Months Access to Homicidal Means: No History of harm to others?: No Assessment of Violence: None Noted Does patient have access to weapons?: No Criminal Charges Pending?: No Does patient have a court date: No Prior Inpatient Therapy: Prior Inpatient Therapy: Yes Prior Therapy Dates: Multiple since age 89 Prior Therapy Facilty/Provider(s): [facility], [facility], [facility] Reason for Treatment: SI, psychosis, aggression Prior Outpatient Therapy: Prior Outpatient Therapy: Yes Prior Therapy Dates: Ongoing Prior Therapy  Facilty[facility]Provider[facility]): [facility] Reason for Treatment: Med mgmt / [facility]  Current Facility-Administered Medications  Medication Dose Route Frequency Provider Last Rate Last Dose  . albuterol (PROVENTIL HFA;VENTOLIN HFA) 108 (90 BASE) MCG/ACT inhaler 1 puff  1 puff Inhalation Q6H PRN Glynn OctaveStephen Rancour, MD      . asenapine (SAPHRIS) sublingual tablet 5 mg  5 mg Sublingual BID Glynn OctaveStephen Rancour, MD   5 mg at 05/24/14 0957  . chlorproMAZINE (THORAZINE) tablet 50 mg  50 mg Oral TID Charm RingsJamison Y Lord, NP   50 mg at 05/24/14 0955  . fluticasone (FLOVENT HFA) 110 MCG/ACT inhaler 2 puff  2 puff Inhalation BID Glynn OctaveStephen Rancour, MD   2 puff at 05/24/14 [id number]  . fluvoxaMINE (LUVOX) tablet 50 mg  50 mg Oral QHS Glynn OctaveStephen Rancour, MD   50 mg at 05/23/14 2110  . hydrOXYzine (ATARAX/VISTARIL) tablet 25 mg  25 mg Oral Q6H PRN Charm RingsJamison Y Lord, NP   25 mg at 05/23/14 2356  . nicotine (NICODERM CQ - dosed in mg/24 hours) patch 14 mg  14 mg Transdermal Daily Charm RingsJamison Y Lord, NP   14 mg at 05/23/14 1042  . QUEtiapine (SEROQUEL) tablet 100 mg  100 mg Oral QHS PRN Worthy FlankIjeoma E Nwaeze, NP   100 mg at 05/23/14 2318  . traZODone (DESYREL) tablet 50 mg  50 mg Oral QHS PRN Glynn OctaveStephen Rancour, MD       Current Outpatient Prescriptions  Medication Sig Dispense Refill  . albuterol (PROVENTIL HFA;VENTOLIN HFA) 108 (90 BASE) MCG/ACT inhaler Inhale 1 puff into the lungs every 6 (six) hours as needed for wheezing or shortness of breath. 18 g 3  . asenapine (SAPHRIS) 5 MG SUBL 24 hr tablet Place 5 mg under the tongue 2 (two) times daily.    . DiphenhydrAMINE HCl (BENADRYL PO) Take 1 tablet by mouth once.    Marland Kitchen. EPINEPHrine (EPIPEN 2-PAK IJ) Inject as directed.    . fluticasone (FLOVENT HFA) 110 MCG/ACT inhaler Inhale 2 puffs into the lungs every 12 (twelve) hours. 1 Inhaler 12  . fluvoxaMINE (LUVOX) 50 MG tablet Take 50 mg by mouth at bedtime.    Marland Kitchen. ibuprofen (ADVIL,MOTRIN) 200 MG tablet Take 200 mg by mouth every 6 (six) hours as  needed (harm himself).    . Pseudoephedrine-APAP-DM (DAYQUIL PO) Take 30 mLs by mouth once.    Marland Kitchen. QUEtiapine (SEROQUEL) 100 MG tablet Take 100 mg by mouth 2 (two) times daily as needed (for mood).    . traZODone (DESYREL) 50 MG tablet Take 50 mg by mouth at bedtime as needed for sleep (sleep).     . benztropine (COGENTIN) 1 MG tablet Take 1 tablet (1 mg total) by mouth 2 (two) times daily. (Patient not taking: Reported on 05/18/2014) 60 tablet 0  . Vitamin D, Ergocalciferol, (DRISDOL) 50000 UNITS CAPS capsule Take 1 capsule (50,000 Units total) by mouth every 7 (seven)  days. (Patient not taking: Reported on 05/18/2014) 12 capsule 0    Musculoskeletal: Strength & Muscle Tone: within normal limits Gait & Station: normal Patient leans: N/A  Psychiatric Specialty Exam:     Blood pressure 104/68, pulse 77, temperature 98 F (36.7 C), temperature source Oral, resp. rate 18, SpO2 100 %.There is no weight on file to calculate BMI.  General Appearance: Casual  Eye Contact::  Good  Speech:  Normal Rate  Volume:  Normal  Mood:  euthymic  Affect:  Congruent  Thought Process:  Coherent  Orientation:  Full (Time, Place, and Person)  Thought Content:  WDL  Suicidal Thoughts:  No  Homicidal Thoughts:  No  Memory:  Immediate;   Good Recent;   Good Remote;   Good  Judgement:  Fair  Insight:  Fair  Psychomotor Activity:  Normal  Concentration:  Good  Recall:  Good  Fund of Knowledge:Good  Language: Good  Akathisia:  No  Handed:  Right  AIMS (if indicated):     Assets:  Housing Leisure Time Physical Health Resilience Social Support  ADL's:  Intact  Cognition: WNL  Sleep:      Medical Decision Making: reviewed recent notes. Assessed [name]  Treatment Plan Summary: Plan:  Admit Disposition:  Discharge home once again.  He says he plans to be there when the new ACT team comes tomorrow to assess him   Benjaman Pott, PMH-NP 05/24/2014 11:46 AM Patient seen face-to-face for  psychiatric evaluation, chart reviewed and case discussed with the physician extender and developed treatment plan. Reviewed the information documented and agree with the treatment plan. Thedore Mins, MD

## 2014-05-25 ENCOUNTER — Emergency Department (HOSPITAL_COMMUNITY)
Admission: EM | Admit: 2014-05-25 | Discharge: 2014-05-25 | Disposition: A | Discharge status: Home / Self Care | Payer: Medicaid Other | Attending: Emergency Medicine | Admitting: Emergency Medicine

## 2014-05-25 ENCOUNTER — Encounter (HOSPITAL_COMMUNITY): Payer: Self-pay | Admitting: Emergency Medicine

## 2014-05-25 DIAGNOSIS — F209 Schizophrenia, unspecified: Secondary | ICD-10-CM | POA: Insufficient documentation

## 2014-05-25 DIAGNOSIS — Z79899 Other long term (current) drug therapy: Secondary | ICD-10-CM | POA: Insufficient documentation

## 2014-05-25 DIAGNOSIS — T50904A Poisoning by unspecified drugs, medicaments and biological substances, undetermined, initial encounter: Secondary | ICD-10-CM

## 2014-05-25 DIAGNOSIS — T450X4A Poisoning by antiallergic and antiemetic drugs, undetermined, initial encounter: Secondary | ICD-10-CM | POA: Insufficient documentation

## 2014-05-25 DIAGNOSIS — J45909 Unspecified asthma, uncomplicated: Secondary | ICD-10-CM | POA: Diagnosis not present

## 2014-05-25 DIAGNOSIS — F250 Schizoaffective disorder, bipolar type: Secondary | ICD-10-CM | POA: Diagnosis not present

## 2014-05-25 DIAGNOSIS — F4324 Adjustment disorder with disturbance of conduct: Secondary | ICD-10-CM

## 2014-05-25 DIAGNOSIS — F329 Major depressive disorder, single episode, unspecified: Secondary | ICD-10-CM

## 2014-05-25 DIAGNOSIS — Y9389 Activity, other specified: Secondary | ICD-10-CM | POA: Diagnosis not present

## 2014-05-25 DIAGNOSIS — Y998 Other external cause status: Secondary | ICD-10-CM | POA: Insufficient documentation

## 2014-05-25 DIAGNOSIS — Y9289 Other specified places as the place of occurrence of the external cause: Secondary | ICD-10-CM | POA: Insufficient documentation

## 2014-05-25 DIAGNOSIS — F32A Depression, unspecified: Secondary | ICD-10-CM

## 2014-05-25 DIAGNOSIS — T39314A Poisoning by propionic acid derivatives, undetermined, initial encounter: Secondary | ICD-10-CM | POA: Insufficient documentation

## 2014-05-25 LAB — CBC WITH DIFFERENTIAL/PLATELET
Basophils Absolute: 0 10*3/uL (ref 0.0–0.1)
Basophils Relative: 1 % (ref 0–1)
EOS PCT: 4 % (ref 0–5)
Eosinophils Absolute: 0.2 10*3/uL (ref 0.0–0.7)
HEMATOCRIT: 43.7 % (ref 39.0–52.0)
HEMOGLOBIN: 14.5 g/dL (ref 13.0–17.0)
LYMPHS ABS: 1.5 10*3/uL (ref 0.7–4.0)
Lymphocytes Relative: 36 % (ref 12–46)
MCH: 27.1 pg (ref 26.0–34.0)
MCHC: 33.2 g/dL (ref 30.0–36.0)
MCV: 81.7 fL (ref 78.0–100.0)
MONO ABS: 0.4 10*3/uL (ref 0.1–1.0)
Monocytes Relative: 10 % (ref 3–12)
NEUTROS ABS: 2.2 10*3/uL (ref 1.7–7.7)
Neutrophils Relative %: 49 % (ref 43–77)
Platelets: 291 10*3/uL (ref 150–400)
RBC: 5.35 MIL/uL (ref 4.22–5.81)
RDW: 13.9 % (ref 11.5–15.5)
WBC: 4.3 10*3/uL (ref 4.0–10.5)

## 2014-05-25 LAB — COMPREHENSIVE METABOLIC PANEL
ALT: 32 U/L (ref 0–53)
AST: 31 U/L (ref 0–37)
Albumin: 4.2 g/dL (ref 3.5–5.2)
Alkaline Phosphatase: 70 U/L (ref 39–117)
Anion gap: 8 (ref 5–15)
BILIRUBIN TOTAL: 0.3 mg/dL (ref 0.3–1.2)
BUN: 8 mg/dL (ref 6–23)
CO2: 29 mmol/L (ref 19–32)
Calcium: 9 mg/dL (ref 8.4–10.5)
Chloride: 104 mmol/L (ref 96–112)
Creatinine, Ser: 1.04 mg/dL (ref 0.50–1.35)
GFR calc Af Amer: >90 mL/min (ref >90)
GFR calc non Af Amer: >90 mL/min (ref >90)
GLUCOSE: 100 mg/dL — AB (ref 70–99)
POTASSIUM: 4.2 mmol/L (ref 3.5–5.1)
Sodium: 141 mmol/L (ref 135–145)
Total Protein: 7.6 g/dL (ref 6.0–8.3)

## 2014-05-25 LAB — RAPID URINE DRUG SCREEN, HOSP PERFORMED
Amphetamines: NOT DETECTED
Barbiturates: NOT DETECTED
Benzodiazepines: POSITIVE — AB
COCAINE: NOT DETECTED
Opiates: NOT DETECTED
Tetrahydrocannabinol: NOT DETECTED

## 2014-05-25 LAB — ACETAMINOPHEN LEVEL
Acetaminophen (Tylenol), Serum: <10 ug/mL — ABNORMAL LOW (ref 10–30)
Acetaminophen (Tylenol), Serum: <10 ug/mL — ABNORMAL LOW (ref 10–30)

## 2014-05-25 LAB — ETHANOL: Alcohol, Ethyl (B): <5 mg/dL (ref 0–9)

## 2014-05-25 LAB — SALICYLATE LEVEL: Salicylate Lvl: <4 mg/dL (ref 2.8–20.0)

## 2014-05-25 NOTE — [facility] Suicide Risk Assessment (Signed)
[facility] Discharge Suicide Risk Assessment   Demographic Factors:  24 year old male  Total Time spent with patient: 30 minutes  Musculoskeletal: Strength & Muscle Tone: within normal limits Gait & Station: normal Patient leans: N/A  Psychiatric Specialty Exam: Physical Exam  ROS  Blood pressure 134/75, pulse 85, temperature 98.2 F (36.8 C), temperature source Oral, resp. rate 20, SpO2 98 %.There is no weight on file to calculate BMI.  General Appearance: Fairly Groomed  Patent attorneyye Contact::  Good  Speech:  Clear and Coherent409  Volume:  Normal  Mood:  Euthymic  Affect:  Appropriate  Thought Process:  Coherent and Logical  Orientation:  Full (Time, Place, and Person)  Thought Content:  Negative  Suicidal Thoughts:  No  Homicidal Thoughts:  No  Memory:  Immediate;   Good Recent;   Good Remote;   Good  Judgement:  Fair  Insight:  Shallow  Psychomotor Activity:  Normal  Concentration:  Good  Recall:  Good  Fund of Knowledge:Good  Language: Good  Akathisia:  Negative  Handed:  Right  AIMS (if indicated):     Assets:  ArchitectCommunication Skills Financial Resources/Insurance Housing Physical Health  Sleep:     Cognition: WNL  ADL's:  Intact      Has this patient used any form of tobacco in the last 30 days? (Cigarettes, Smokeless Tobacco, Cigars, and/or Pipes) No  Mental Status Per Nursing Assessment::   On Admission:     Current Mental Status by Physician: NA  Loss Factors: NA  Historical Factors: Prior suicide attempts  Risk Reduction Factors:   Living with another person, especially a relative and Positive social support  Continued Clinical Symptoms:  Previous Psychiatric Diagnoses and Treatments  Cognitive Features That Contribute To Risk:  Closed-mindedness    Suicide Risk:  Minimal: No identifiable suicidal ideation.  Patients presenting with no risk factors but with morbid ruminations; may be classified as minimal risk based on the severity of the depressive  symptoms  Principal Problem: <principal problem not specified> Discharge Diagnoses:  Patient Active Problem List   Diagnosis Date Noted  . Schizoaffective disorder, bipolar type [F25.0]   . Asthma, chronic [J45.909] 03/25/2014  . Obesity, morbid [E66.01] 03/25/2014  . Drug overdose, intentional [T50.902A] 10/09/2013  . Aggression [F60.89] 08/22/2013      Plan Of Care/Follow-up recommendations:  Activity:  resume usual activity Diet:  resume usual diet  Is patient on multiple antipsychotic therapies at discharge:  No   Has Patient had three or more failed trials of antipsychotic monotherapy by history:  No  Recommended Plan for Multiple Antipsychotic Therapies: NA    TAYLOR,GERALD D 05/25/2014, 3:09 PM

## 2014-05-25 NOTE — ED Notes (Signed)
Per EMS: Pt states that he overdosed on unknown amount of benadryl and ibuprofen.  States that the voices in his head are telling him to kill himself.  States that he took this medication about 15 mins PTA of EMS (appx 1140).

## 2014-05-25 NOTE — Discharge Instructions (Signed)
°Depression °Depression refers to feeling sad, low, down in the dumps, blue, gloomy, or empty. In general, there are two kinds of depression: °1. Normal sadness or normal grief. This kind of depression is one that we all feel from time to time after upsetting life experiences, such as the loss of a job or the ending of a relationship. This kind of depression is considered normal, is short lived, and resolves within a few days to 2 weeks. Depression experienced after the loss of a loved one (bereavement) often lasts longer than 2 weeks but normally gets better with time. °2. Clinical depression. This kind of depression lasts longer than normal sadness or normal grief or interferes with your ability to function at home, at work, and in school. It also interferes with your personal relationships. It affects almost every aspect of your life. Clinical depression is an illness. °Symptoms of depression can also be caused by conditions other than those mentioned above, such as: °· Physical illness. Some physical illnesses, including underactive thyroid gland (hypothyroidism), severe anemia, specific types of cancer, diabetes, uncontrolled seizures, heart and lung problems, strokes, and chronic pain are commonly associated with symptoms of depression. °· Side effects of some prescription medicine. In some people, certain types of medicine can cause symptoms of depression. °· Substance abuse. Abuse of alcohol and illicit drugs can cause symptoms of depression. °SYMPTOMS °Symptoms of normal sadness and normal grief include the following: °· Feeling sad or crying for short periods of time. °· Not caring about anything (apathy). °· Difficulty sleeping or sleeping too much. °· No longer able to enjoy the things you used to enjoy. °· Desire to be by oneself all the time (social isolation). °· Lack of energy or motivation. °· Difficulty concentrating or remembering. °· Change in appetite or weight. °· Restlessness or  agitation. °Symptoms of clinical depression include the same symptoms of normal sadness or normal grief and also the following symptoms: °· Feeling sad or crying all the time. °· Feelings of guilt or worthlessness. °· Feelings of hopelessness or helplessness. °· Thoughts of suicide or the desire to harm yourself (suicidal ideation). °· Loss of touch with reality (psychotic symptoms). Seeing or hearing things that are not real (hallucinations) or having false beliefs about your life or the people around you (delusions and paranoia). °DIAGNOSIS  °The diagnosis of clinical depression is usually based on how bad the symptoms are and how long they have lasted. Your health care provider will also ask you questions about your medical history and substance use to find out if physical illness, use of prescription medicine, or substance abuse is causing your depression. Your health care provider may also order blood tests. °TREATMENT  °Often, normal sadness and normal grief do not require treatment. However, sometimes antidepressant medicine is given for bereavement to ease the depressive symptoms until they resolve. °The treatment for clinical depression depends on how bad the symptoms are but often includes antidepressant medicine, counseling with a mental health professional, or both. Your health care provider will help to determine what treatment is best for you. °Depression caused by physical illness usually goes away with appropriate medical treatment of the illness. If prescription medicine is causing depression, talk with your health care provider about stopping the medicine, decreasing the dose, or changing to another medicine. °Depression caused by the abuse of alcohol or illicit drugs goes away when you stop using these substances. Some adults need professional help in order to stop drinking or using drugs. °SEEK IMMEDIATE MEDICAL   CARE IF: °· You have thoughts about hurting yourself or others. °· You lose touch  with reality (have psychotic symptoms). °· You are taking medicine for depression and have a serious side effect. °FOR MORE INFORMATION °· National Alliance on Mental Illness: www.nami.org  °· National Institute of Mental Health: www.nimh.nih.gov  °Document Released: 02/10/2000 Document Revised: 06/29/2013 Document Reviewed: 05/14/2011 °ExitCare® Patient Information ©2015 [employer]. This information is not intended to replace advice given to you by your health care provider. Make sure you discuss any questions you have with your health care provider. °  Emergency Department Resource Guide °1) Find a Doctor and Pay Out of Pocket °Although you won't have to find out who is covered by your insurance plan, it is a good idea to ask around and get recommendations. You will then need to call the office and see if the doctor you have chosen will accept you as a new patient and what types of options they offer for patients who are self-pay. Some doctors offer discounts or will set up payment plans for their patients who do not have insurance, but you will need to ask so you aren't surprised when you get to your appointment. ° °2) Contact Your Local Health Department °Not all health departments have doctors that can see patients for sick visits, but many do, so it is worth a call to see if yours does. If you don't know where your local health department is, you can check in your phone book. The CDC also has a tool to help you locate your state's health department, and many state websites also have listings of all of their local health departments. ° °3) Find a Walk-in Clinic °If your illness is not likely to be very severe or complicated, you may want to try a walk in clinic. These are popping up all over the country in pharmacies, drugstores, and shopping centers. They're usually staffed by nurse practitioners or physician assistants that have been trained to treat common illnesses and complaints. They're usually fairly  quick and inexpensive. However, if you have serious medical issues or chronic medical problems, these are probably not your best option. ° °No Primary Care Doctor: °- Call [facility] at  832-8000 - they can help you locate a primary care doctor that  accepts your insurance, provides certain services, etc. °- Physician Referral Service- 1-800-533-3463 ° °Chronic Pain Problems: °Organization         Address  Phone   Notes  °[facility]  (336) 297-2271 Patients need to be referred by their primary care doctor.  ° °Medication Assistance: °Organization         Address  Phone   Notes  °[place] [facility] [address] °[place], [place] [address] (336) 641-8030 --Must be a resident of [place] °-- Must have NO insurance coverage whatsoever (no Medicaid/ Medicare, etc.) °-- The pt. MUST have a primary care doctor that directs their care regularly and follows them in the community °  °[facility]  (866) 331-1348   °[employer]  (888) 892-1162   ° °Agencies that provide inexpensive medical care: °Organization         Address  Phone   Notes  °[facility]  (336) 832-8035   °[facility]    (336) 832-7272   °[facility] [address] °[place], [place] [address] (336) 832-4777   °[facility] [address], °[place] (336) 271-4999   °  [facility]    (336) 373-0678   °[facility]    (336) 272-1050   °[facility] ° [address], [place] Phone:  (336) 832-4444, Fax:  (336) 832-4440 Hours of Operation:  9 am - 6 pm, M-F.  Also accepts Medicaid/Medicare and self-pay.  °[facility] ° [address], [place] Phone: (336) 832-3150, Fax: (336) 832-3151. Hours of Operation:  8:30 am - 5:30 pm, M-F.  Also accepts Medicaid and self-pay.  °[facility] [place] [address], [place] Phone: (336) 878-6027    °[facility] [address], [place], [place] (336)723-1848, Ext. 123 Mondays & Thursdays: 7-9 AM.  First 15 patients are seen on a first come, first serve basis. °  ° °Medicaid-accepting [place] Providers: ° °Organization         Address  Phone   Notes  °[facility] [address], [place] (336) 641-2100 Also accepts self-pay patients.  °[facility] [address], [place] ° (336) 856-9996   °[facility] [address], [place] (336) 288-8857   °[facility] [address] [place] [address], [place] (336) 299-7000   °Veita Bland [address], [place]  ° (336) 373-1557 Only accepts [place] Access Medicaid patients after they have their name applied to their card.  ° °Self-Pay (no insurance) in [place]: ° °Organization         Address  Phone   Notes  °Sickle Cell Patients, [facility] [address], [place] (336) 832-1970   °[facility] [address], [place] (336) 832-4400   °[facility] ° [address], [place] (336) 992-4800   °[facility]/Dr. Osei-Bonsu ° [address] [place] [address], [place] or [address], [place] (336) 841-8500 Phone number for both [place] and [place] locations is the same.  °[facility] [address], [place] (336) 299-0000   °[facility] [address] [place] [address], [place] or [address] (336) 852-7530 °(336) 878-2260   °[facility] [address], [place] (336) 350-1642, phone; (336) 294-5005, fax Sees patients 1st and 3rd Saturday of every month.  Must not qualify for public or private insurance (i.e. Medicaid, Medicare, [place] Health Choice, Veterans' Benefits) • Household income should be no more than 200% of the poverty level •The clinic cannot treat you if you are  pregnant or think you are pregnant • Sexually transmitted diseases are not treated at the clinic.  ° °Dental Care: °Organization         Address  Phone  Notes  °[facility] [address], [place] (336) 641-6152 Accepts children up to age 21 who are enrolled in Medicaid or [place] Health Choice; pregnant women with a Medicaid card; and children who have applied for Medicaid or [place] Health Choice, but were declined, whose parents can pay a reduced fee at time of service.  °[facility] [place]  [address], [place] (336) 641-7733 Accepts children up to age 21 who are enrolled in Medicaid or [place] Health Choice; pregnant women with a Medicaid card; and children who have applied for Medicaid or [place] Health Choice, but were declined, whose   parents can pay a reduced fee at time of service.  °[facility] ° [address], [place] (336) 641-4533 Patients are seen by appointment only. Walk-ins are not accepted. [facility] will see patients 18 years of age and older. °Monday - Tuesday (8am-5pm) °Most Wednesdays (8:30-5pm) °$30 per visit, cash only  °[facility] ° [address], [place] (336) 641-4533 Patients are seen by appointment only. Walk-ins are not accepted. [facility] will see patients 18 years of age and older. °One Wednesday Evening (Monthly: Volunteer Based).  $30 per visit, cash only  °[facility]  (919) 537-3737 for adults; Children under age 4, call Graduate Pediatric Dentistry at (919) [phone]. Children aged 4-14, please call (919) [phone] to request a pediatric application. ° Dental services are provided in all areas of dental care including fillings, crowns and bridges, complete and partial dentures, implants, gum treatment, root canals, and extractions. Preventive care is also provided. Treatment is provided to  both adults and children. °Patients are selected via a lottery and there is often a waiting list. °  °[facility] [address], °[place] ° (336) 763-8833 www.drcivils.com °  °[facility] [address], [place], [place] (336)723-1848, Ext. 123 Second and Fourth Thursday of each month, opens at 6:30 AM; Clinic ends at 9 AM.  Patients are seen on a first-come first-served basis, and a limited number are seen during each clinic.  ° °[facility] ° [address], [place], [place] (336) 723-7904   Eligibility Requirements °You must have lived in [place], [place], or [place] counties for at least the last three months. °  You cannot be eligible for state or federal sponsored healthcare insurance, including Veterans Administration, Medicaid, or Medicare. °  You generally cannot be eligible for healthcare insurance through your employer.  °  How to apply: °Eligibility screenings are held every Tuesday and Wednesday afternoon from 1:00 pm until 4:00 pm. You do not need an appointment for the interview!  °[facility] [address], [place], [place] 336-631-2330   °[facility]  336-342-8273   °[facility]  336-703-3100   °[facility]  336-570-6415   ° °Behavioral Health Resources in the Community: °Intensive Outpatient Programs °Organization         Address  Phone  Notes  °[facility] [address], [place], [place] 336-878-6098   °[facility] [address], [place], [place] 336-832-9800   °[facility] [address], [place], [place] ° 336-882-2125   °[facility] [address],  °[place], [place] 1-800-853-5163 or 336-641-4981   °Substance Abuse Resources °Organization         Address  Phone  Notes  °[facility]  336-882-2125   °[facility]  336-784-9470   °[facility]   336-285-9073   °[facility]  336-845-3988   °Residential & Outpatient Substance Abuse Program  1-800-659-3381   °Psychological Services °Organization         Address  Phone  Notes  °[facility]  336- 832-9600   °[facility]  336- 378-7881   °[facility] [address], [place] 1-800-853-5163 or 336-641-4981   ° °Mobile Crisis Teams °Organization         Address  Phone  Notes  °[facility], Mobile Crisis   Care Unit  1-877-626-1772  °Assertive °[facility] ° [address] [place], [place] 336-834-9664  °Sharon DeEsch [address] °[place] 336-554-5454  ° °Self-Help/Support Groups °Organization         Address  Phone             Notes  °[facility] - variety of support groups  336- 373-1402 Call for more information  °Narcotics Anonymous (NA), [facility] [address], °[place]  2 meetings at this location  ° °Residential Treatment Programs °Organization         Address  Phone  Notes  °[facility] [address],    °[place]  1-866-801-8205   °[facility] ° [address], [name], [place] 704-293-8524   °[facility] [address], [place] 336-845-3988 Admissions: 8am-3pm M-F  °[facility] [address]    °[place], [place] 336-841-1104   °[facility] [address], [place], [place] 336-379-7146   °[facility] [address]  °[place], [place] 336-285-9073   °[facility] Intensive Outpatient [address], [place], [place] 336-852-3033   °[facility] [address]  °[place], [place] 1-877-615-2722 or 336-784-9470   °[facility] [address] [place], [place] 336-227-7417 Accepts Medicaid  °[facility] [address]  °[place] 1-800-659-3381 Substance Abuse/Addiction Treatment  ° °[place] Behavioral Health Resources °Organization         Address  Phone  Notes  °[facility]  (888) 581-9988   °Julie Brannon, PhD [address] [place], [place]   (336) 349-5553 or (336) 951-0000   °[facility]   [address] °[place], [place] (336) 349-4454   °[facility] [address], [place], [place] (336) 342-8316 Insurance/Medicaid/sponsorship through [facility]  °[facility] [address]                                    [place], [place] (336) 342-8316 Therapy/tele-psych/case  °[facility] [address]  ° [place],  (336) 349-2233    °Dr. Arfeen  (336) 349-4544   °[facility] [place]  [employer] [facility] [place] [facility] 1) [address], [place] °2) [address], [place] °3)  [address], [place] (336) 349-3220 °(336) 342-7768 ° °(336) 342-8140   °[place] [facility] (336) 342-1394 or (336) 342-3537 (After Hours)    ° °   °

## 2014-05-25 NOTE — ED Provider Notes (Signed)
CSN: [id number]     Arrival date & time 05/25/14  1233 History   First MD Initiated Contact with Patient 05/25/14 1259     Chief Complaint  Patient presents with  . Suicidal  . Drug Overdose     Patient is a 24 y.o. male presenting with Overdose. The history is provided by the patient.  Drug Overdose This is a new problem. Episode onset: unknown time ago. The problem occurs constantly. The problem has not changed since onset.Associated symptoms include chest pain and abdominal pain. Pertinent negatives include no headaches. Nothing aggravates the symptoms. Nothing relieves the symptoms.  pt presents for overdose He reports he called the psychiatric hotline and told them he planned on jumping in front of a car. Police were called and he wanted to make sure "they had a reason take me in" so he took medications - benadryl and ibuprofen and he is unsure of quantity. Apparently voices were telling him to do all of this activity  He also mentions abdominal pain and chest pain but does not elaborate on any details.    Past Medical History  Diagnosis Date  . Asthma   . Bipolar 1 disorder   . ODD (oppositional defiant disorder)   . ADHD (attention deficit hyperactivity disorder)   . Schizophrenia   . Suicidal ideation   . Homicidal ideation   . Explosive personality disorder    No past surgical history on file. Family History  Problem Relation Age of Onset  . Asthma Mother   . Asthma Sister   . Thyroid disease     History  Substance Use Topics  . Smoking status: Never Smoker   . Smokeless tobacco: Not on file  . Alcohol Use: 0.0 oz/week    0 Standard drinks or equivalent per week    Review of Systems  Constitutional: Negative for fever.  Cardiovascular: Positive for chest pain.  Gastrointestinal: Positive for abdominal pain. Negative for vomiting.  Neurological: Negative for headaches.  Psychiatric/Behavioral: Positive for suicidal ideas.  All other systems reviewed and  are negative.     Allergies  Carbamazepine; Geodon; Haldol; Strattera; Other; and Shellfish allergy  Home Medications   Prior to Admission medications   Medication Sig Start Date End Date Taking? Authorizing Provider  albuterol (PROVENTIL HFA;VENTOLIN HFA) 108 (90 BASE) MCG/ACT inhaler Inhale 1 puff into the lungs every 6 (six) hours as needed for wheezing or shortness of breath. 03/25/14  Yes Deepak Advani, MD  asenapine (SAPHRIS) 5 MG SUBL 24 hr tablet Place 5 mg under the tongue 2 (two) times daily.   Yes Historical Provider, MD  DiphenhydrAMINE HCl (BENADRYL PO) Take 1 tablet by mouth once.   Yes Historical Provider, MD  EPINEPHrine (EPIPEN 2-PAK IJ) Inject as directed.   Yes Historical Provider, MD  fluticasone (FLOVENT HFA) 110 MCG/ACT inhaler Inhale 2 puffs into the lungs every 12 (twelve) hours. 03/25/14  Yes Doris Cheadle, MD  ibuprofen (ADVIL,MOTRIN) 200 MG tablet Take 200 mg by mouth every 6 (six) hours as needed (harm himself).   Yes Historical Provider, MD  Pseudoephedrine-APAP-DM (DAYQUIL PO) Take 30 mLs by mouth once.   Yes Historical Provider, MD  QUEtiapine (SEROQUEL) 100 MG tablet Take 100 mg by mouth 2 (two) times daily as needed (for mood).   Yes Historical Provider, MD  traZODone (DESYREL) 50 MG tablet Take 50 mg by mouth at bedtime as needed for sleep (sleep).    Yes Historical Provider, MD  benztropine (COGENTIN) 1 MG tablet Take  1 tablet (1 mg total) by mouth 2 (two) times daily. Patient not taking: Reported on 05/18/2014 10/13/13   Beau FannyJohn C Withrow, FNP  Vitamin D, Ergocalciferol, (DRISDOL) 50000 UNITS CAPS capsule Take 1 capsule (50,000 Units total) by mouth every 7 (seven) days. Patient not taking: Reported on 05/18/2014 03/30/14   Doris Cheadleeepak Advani, MD   BP 134/75 mmHg  Pulse 85  Temp(Src) 98.2 F (36.8 C) (Oral)  Resp 20  SpO2 98% Physical Exam CONSTITUTIONAL: Well developed/well nourished, pt is anxious HEAD: Normocephalic/atraumatic ENMT: Mucous membranes  moist NECK: supple no meningeal signs CV: S1/S2 noted, no murmurs/rubs/gallops noted LUNGS: Lungs are clear to auscultation bilaterally, no apparent distress ABDOMEN: soft, nontender, no rebound or guarding, bowel sounds noted throughout abdomen GU:no cva tenderness NEURO: Pt is awake/alert/appropriate, moves all extremitiesx4. Pt is very talkative throughout exam EXTREMITIES: pulses normal/equal, full ROM SKIN: warm, color normal PSYCH: pt is anxious throughout exam  ED Course  Procedures  Labs Review Labs Reviewed  COMPREHENSIVE METABOLIC PANEL - Abnormal; Notable for the following:    Glucose, Bld 100 (*)    All other components within normal limits  ACETAMINOPHEN LEVEL - Abnormal; Notable for the following:    Acetaminophen (Tylenol), Serum <10.0 (*)    All other components within normal limits  CBC WITH DIFFERENTIAL/PLATELET  ETHANOL  SALICYLATE LEVEL  URINE RAPID DRUG SCREEN (HOSP PERFORMED)     EKG Interpretation   Date/Time:  Tuesday May 25 2014 12:47:10 EDT Ventricular Rate:  88 PR Interval:  155 QRS Duration: 93 QT Interval:  347 QTC Calculation: 420 R Axis:   42 Text Interpretation:  Sinus rhythm ST elev, probable normal early repol  pattern Baseline wander in lead(s) V2 V3 V4 V6 No significant change since  last tracing Confirmed by Bebe ShaggyWICKLINE  MD, Tiare Rohlman ([id number]) on 05/25/2014  1:07:28 PM     3:11 PM Pt here for overdose Clinically he is stable, no distress, EKG unchanged and initial labs unremarkable.   He reported CP/Abd pain but no focal tenderness and he is in no distress He reported initially this was done to harm himself but now denies SI He has already been seen by psychiatry dr Ladona Ridgeltaylor He knows patient well He feels is safe/appropriate for discharge from psychiatric standpoint Pt will need repeat APAP level Poison center recommends monitor for 6 hrs At time of signout, f/u on repeat APAP level and monitor until 1800  MDM   Final diagnoses:   Overdose, undetermined intent, initial encounter    Nursing notes including past medical history and social history reviewed and considered in documentation Labs/vital reviewed myself and considered during evaluation     Zadie Rhineonald Alter Moss, MD 05/25/14 1513

## 2014-05-25 NOTE — ED Provider Notes (Signed)
6:30 PM  Pt  Here frequently for suicidal thoughts. Had an overdose on ibuprofen  And Benadryl. Was seen by psychiatry and has been cleared. Was awaiting a second Tylenol level at 4 hours which is negative. He is to heme dynamically stable. We'll discharge patient with outpatient resources.  Layla MawKristen N Tuleen Mandelbaum, DO 05/25/14 1826

## 2014-05-25 NOTE — ED Notes (Signed)
Per Poison Control representative Roshanna: Recommendations:  Draw 4 hour post ingestion acetaminophen level  Ibuprofen: r/f renal failure, draw electrolytes.  Benadryl: potential for seizure - treat seizure with antiemetics. Potential for altered mental status.  IV fluid as needed for tachycardia Anti-emetics are safe for pt. Supportive care. Monitor vital signs and mental status for 6 hours or until pt is asymptomatic. No activated charcoal due to nausea and minimal benefit in this incidence.

## 2014-05-25 NOTE — ED Notes (Signed)
Bed: RESA Expected date:  Expected time:  Means of arrival:  Comments: OD 

## 2014-05-25 NOTE — ED Notes (Signed)
Pt. Is unable to use the restroom at this time, but is aware that we need a urine specimen.  

## 2014-05-25 NOTE — Consult Note (Signed)
[facility] Consult   Reason for Consult:  Reportedly took an overdose Referring Physician:  ED MD Patient Identification: [name] MRN:  [id number] Principal Diagnosis: adjustment disorder with disturbance of conduct Diagnosis:  Adjustment disorder with disturbance of conduct Patient Active Problem List   Diagnosis Date Noted  . Schizoaffective disorder, bipolar type [F25.0]   . Asthma, chronic [J45.909] 03/25/2014  . Obesity, morbid [E66.01] 03/25/2014  . Drug overdose, intentional [T50.902A] 10/09/2013  . Aggression [F60.89] 08/22/2013    Total Time spent with patient: 30 minutes  Subjective:   [name] is a 24 y.o. male patient admitted with repeated visits to the ED with reports of overdoses and suicidal ideation.   HPI:  Mr Myler says he did call the Mobile Crisis line as he was feeling depressed and wanted someone to talk to, they understood him to be suicidal and the police picked him up.  He says he did take some pills because he knew they were going to take him in "and I needed them to have a reason to take me".  In good spirits when I saw him smiling, denying any suicidal thoughts or intent.  While he was here the representative from the ACT team, [facility], met with him and there seemed to be good rapport.  He says he is looking forward to working with them. HPI Elements:   Location:  situational. Quality:  was feeling depressed and anxious he said. Severity:  feeling better now and no suicidal thoughts. Timing:  just having the thoughts as he frequently does. Duration:  years. Context:  as above.  Past Medical History:  Past Medical History  Diagnosis Date  . Asthma   . Bipolar 1 disorder   . ODD (oppositional defiant disorder)   . ADHD (attention deficit hyperactivity disorder)   . Schizophrenia   . Suicidal ideation   . Homicidal ideation   . Explosive personality disorder    No past surgical history on file. Family History:  Family  History  Problem Relation Age of Onset  . Asthma Mother   . Asthma Sister   . Thyroid disease     Social History:  History  Alcohol Use  . 0.0 oz/week  . 0 Standard drinks or equivalent per week     History  Drug Use No    Comment: Patient denies     History   Social History  . Marital Status: Single    Spouse Name: N/A  . Number of Children: N/A  . Years of Education: N/A   Social History Main Topics  . Smoking status: Never Smoker   . Smokeless tobacco: Not on file  . Alcohol Use: 0.0 oz/week    0 Standard drinks or equivalent per week  . Drug Use: No     Comment: Patient denies   . Sexual Activity: Not on file   Other Topics Concern  . None   Social History Narrative   Additional Social History:                          Allergies:   Allergies  Allergen Reactions  . Carbamazepine Anaphylaxis, Other (See Comments) and Cough    Cough up blood  . Geodon [Ziprasidone Hcl] Anaphylaxis and Other (See Comments)    " Causes my throat to close"  . Haldol [Haloperidol Lactate] Anaphylaxis and Other (See Comments)    Patient reports that he stopped taking this because of throat swelling.   Marland Kitchen  Strattera [Atomoxetine] Anaphylaxis, Other (See Comments) and Cough    Cough up blood  . Other Nausea Only and Other (See Comments)    Apple Juice, stomach hurts and itchy throat   . Shellfish Allergy Swelling    Throat swelling    Labs:  Results for orders placed or performed during the hospital encounter of 05/25/14 (from the past 48 hour(s))  Comprehensive metabolic panel     Status: Abnormal   Collection Time: 05/25/14  1:16 PM  Result Value Ref Range   Sodium 141 135 - 145 mmol/L   Potassium 4.2 3.5 - 5.1 mmol/L   Chloride 104 96 - 112 mmol/L   CO2 29 19 - 32 mmol/L   Glucose, Bld 100 (H) 70 - 99 mg/dL   BUN 8 6 - 23 mg/dL   Creatinine, Ser 1.04 0.50 - 1.35 mg/dL   Calcium 9.0 8.4 - 10.5 mg/dL   Total Protein 7.6 6.0 - 8.3 g/dL   Albumin 4.2 3.5 - 5.2  g/dL   AST 31 0 - 37 U/L   ALT 32 0 - 53 U/L   Alkaline Phosphatase 70 39 - 117 U/L   Total Bilirubin 0.3 0.3 - 1.2 mg/dL   GFR calc non Af Amer >90 >90 mL/min   GFR calc Af Amer >90 >90 mL/min    Comment: (NOTE) The eGFR has been calculated using the CKD EPI equation. This calculation has not been validated in all clinical situations. eGFR's persistently <90 mL/min signify possible Chronic Kidney Disease.    Anion gap 8 5 - 15  CBC with Differential/Platelet     Status: None   Collection Time: 05/25/14  1:16 PM  Result Value Ref Range   WBC 4.3 4.0 - 10.5 K/uL   RBC 5.35 4.22 - 5.81 MIL/uL   Hemoglobin 14.5 13.0 - 17.0 g/dL   HCT 43.7 39.0 - 52.0 %   MCV 81.7 78.0 - 100.0 fL   MCH 27.1 26.0 - 34.0 pg   MCHC 33.2 30.0 - 36.0 g/dL   RDW 13.9 11.5 - 15.5 %   Platelets 291 150 - 400 K/uL   Neutrophils Relative % 49 43 - 77 %   Neutro Abs 2.2 1.7 - 7.7 K/uL   Lymphocytes Relative 36 12 - 46 %   Lymphs Abs 1.5 0.7 - 4.0 K/uL   Monocytes Relative 10 3 - 12 %   Monocytes Absolute 0.4 0.1 - 1.0 K/uL   Eosinophils Relative 4 0 - 5 %   Eosinophils Absolute 0.2 0.0 - 0.7 K/uL   Basophils Relative 1 0 - 1 %   Basophils Absolute 0.0 0.0 - 0.1 K/uL  Acetaminophen level     Status: Abnormal   Collection Time: 05/25/14  1:16 PM  Result Value Ref Range   Acetaminophen (Tylenol), Serum <10.0 (L) 10 - 30 ug/mL    Comment:        THERAPEUTIC CONCENTRATIONS VARY SIGNIFICANTLY. A RANGE OF 10-30 ug/mL MAY BE AN EFFECTIVE CONCENTRATION FOR MANY PATIENTS. HOWEVER, SOME ARE BEST TREATED AT CONCENTRATIONS OUTSIDE THIS RANGE. ACETAMINOPHEN CONCENTRATIONS >150 ug/mL AT 4 HOURS AFTER INGESTION AND >50 ug/mL AT 12 HOURS AFTER INGESTION ARE OFTEN ASSOCIATED WITH TOXIC REACTIONS.   Ethanol     Status: None   Collection Time: 05/25/14  1:16 PM  Result Value Ref Range   Alcohol, Ethyl (B) <5 0 - 9 mg/dL    Comment:        LOWEST DETECTABLE LIMIT FOR SERUM ALCOHOL IS  11 mg/dL FOR MEDICAL  PURPOSES ONLY   Salicylate level     Status: None   Collection Time: 05/25/14  1:16 PM  Result Value Ref Range   Salicylate Lvl <5.8 2.8 - 20.0 mg/dL    Vitals: Blood pressure 134/75, pulse 85, temperature 98.2 F (36.8 C), temperature source Oral, resp. rate 20, SpO2 98 %.  Risk to Self:   Risk to Others:   Prior Inpatient Therapy:   Prior Outpatient Therapy:    No current facility-administered medications for this encounter.   Current Outpatient Prescriptions  Medication Sig Dispense Refill  . albuterol (PROVENTIL HFA;VENTOLIN HFA) 108 (90 BASE) MCG/ACT inhaler Inhale 1 puff into the lungs every 6 (six) hours as needed for wheezing or shortness of breath. 18 g 3  . asenapine (SAPHRIS) 5 MG SUBL 24 hr tablet Place 5 mg under the tongue 2 (two) times daily.    . DiphenhydrAMINE HCl (BENADRYL PO) Take 1 tablet by mouth once.    Marland Kitchen EPINEPHrine (EPIPEN 2-PAK IJ) Inject as directed.    . fluticasone (FLOVENT HFA) 110 MCG/ACT inhaler Inhale 2 puffs into the lungs every 12 (twelve) hours. 1 Inhaler 12  . ibuprofen (ADVIL,MOTRIN) 200 MG tablet Take 200 mg by mouth every 6 (six) hours as needed (harm himself).    . Pseudoephedrine-APAP-DM (DAYQUIL PO) Take 30 mLs by mouth once.    Marland Kitchen QUEtiapine (SEROQUEL) 100 MG tablet Take 100 mg by mouth 2 (two) times daily as needed (for mood).    . traZODone (DESYREL) 50 MG tablet Take 50 mg by mouth at bedtime as needed for sleep (sleep).     . benztropine (COGENTIN) 1 MG tablet Take 1 tablet (1 mg total) by mouth 2 (two) times daily. (Patient not taking: Reported on 05/18/2014) 60 tablet 0  . Vitamin D, Ergocalciferol, (DRISDOL) 50000 UNITS CAPS capsule Take 1 capsule (50,000 Units total) by mouth every 7 (seven) days. (Patient not taking: Reported on 05/18/2014) 12 capsule 0    Musculoskeletal: Strength & Muscle Tone: within normal limits Gait & Station: normal Patient leans: N/A  Psychiatric Specialty Exam: Physical Exam  ROS  Blood pressure  134/75, pulse 85, temperature 98.2 F (36.8 C), temperature source Oral, resp. rate 20, SpO2 98 %.There is no weight on file to calculate BMI.  General Appearance: Fairly Groomed  Engineer, water::  Good  Speech:  Clear and Coherent  Volume:  Normal  Mood:  Euthymic  Affect:  Appropriate  Thought Process:  Coherent and Logical  Orientation:  Full (Time, Place, and Person)  Thought Content:  Negative  Suicidal Thoughts:  No  Homicidal Thoughts:  No  Memory:  Immediate;   Good Recent;   Good Remote;   Good  Judgement:  Fair  Insight:  Shallow  Psychomotor Activity:  Normal  Concentration:  Good  Recall:  Good  Fund of Knowledge:Good  Language: Good  Akathisia:  Negative  Handed:  Right  AIMS (if indicated):     Assets:  Agricultural consultant Housing Physical Health  ADL's:  Intact  Cognition: WNL  Sleep:      Medical Decision Making: Established Problem, Stable/Improving (1)  Treatment Plan Summary: Plan discharge home when medically cleared  Plan:  No evidence of imminent risk to self or others at present.   Disposition: as above  TAYLOR,GERALD D 05/25/2014 2:54 PM

## 2014-05-30 ENCOUNTER — Encounter (HOSPITAL_COMMUNITY): Payer: Self-pay | Admitting: *Deleted

## 2014-05-30 ENCOUNTER — Emergency Department (HOSPITAL_COMMUNITY)
Admission: EM | Admit: 2014-05-30 | Discharge: 2014-05-31 | Disposition: A | Discharge status: Home / Self Care | Payer: Federal, State, Local not specified - Other | Attending: Emergency Medicine | Admitting: Emergency Medicine

## 2014-05-30 DIAGNOSIS — J45909 Unspecified asthma, uncomplicated: Secondary | ICD-10-CM | POA: Diagnosis not present

## 2014-05-30 DIAGNOSIS — T450X2A Poisoning by antiallergic and antiemetic drugs, intentional self-harm, initial encounter: Secondary | ICD-10-CM | POA: Diagnosis not present

## 2014-05-30 DIAGNOSIS — Z79899 Other long term (current) drug therapy: Secondary | ICD-10-CM | POA: Diagnosis not present

## 2014-05-30 DIAGNOSIS — F329 Major depressive disorder, single episode, unspecified: Secondary | ICD-10-CM | POA: Insufficient documentation

## 2014-05-30 DIAGNOSIS — F4324 Adjustment disorder with disturbance of conduct: Secondary | ICD-10-CM | POA: Insufficient documentation

## 2014-05-30 DIAGNOSIS — F250 Schizoaffective disorder, bipolar type: Secondary | ICD-10-CM | POA: Diagnosis not present

## 2014-05-30 DIAGNOSIS — R45851 Suicidal ideations: Secondary | ICD-10-CM | POA: Diagnosis present

## 2014-05-30 LAB — COMPREHENSIVE METABOLIC PANEL
ALT: 36 U/L (ref 0–53)
AST: 30 U/L (ref 0–37)
Albumin: 4.6 g/dL (ref 3.5–5.2)
Alkaline Phosphatase: 79 U/L (ref 39–117)
Anion gap: 9 (ref 5–15)
BILIRUBIN TOTAL: 0.5 mg/dL (ref 0.3–1.2)
BUN: 9 mg/dL (ref 6–23)
CHLORIDE: 103 mmol/L (ref 96–112)
CO2: 29 mmol/L (ref 19–32)
Calcium: 9.4 mg/dL (ref 8.4–10.5)
Creatinine, Ser: 1.11 mg/dL (ref 0.50–1.35)
Glucose, Bld: 91 mg/dL (ref 70–99)
POTASSIUM: 4 mmol/L (ref 3.5–5.1)
Sodium: 141 mmol/L (ref 135–145)
Total Protein: 8.2 g/dL (ref 6.0–8.3)

## 2014-05-30 LAB — ETHANOL: Alcohol, Ethyl (B): <5 mg/dL (ref 0–9)

## 2014-05-30 LAB — RAPID URINE DRUG SCREEN, HOSP PERFORMED
Amphetamines: NOT DETECTED
BENZODIAZEPINES: NOT DETECTED
Barbiturates: NOT DETECTED
Cocaine: NOT DETECTED
Opiates: NOT DETECTED
Tetrahydrocannabinol: NOT DETECTED

## 2014-05-30 LAB — CBC
HCT: 46.8 % (ref 39.0–52.0)
Hemoglobin: 15.7 g/dL (ref 13.0–17.0)
MCH: 27.4 pg (ref 26.0–34.0)
MCHC: 33.5 g/dL (ref 30.0–36.0)
MCV: 81.8 fL (ref 78.0–100.0)
Platelets: 326 10*3/uL (ref 150–400)
RBC: 5.72 MIL/uL (ref 4.22–5.81)
RDW: 14.1 % (ref 11.5–15.5)
WBC: 7.1 10*3/uL (ref 4.0–10.5)

## 2014-05-30 LAB — SALICYLATE LEVEL: Salicylate Lvl: <4 mg/dL (ref 2.8–20.0)

## 2014-05-30 LAB — ACETAMINOPHEN LEVEL
Acetaminophen (Tylenol), Serum: <10 ug/mL — ABNORMAL LOW (ref 10–30)
Acetaminophen (Tylenol), Serum: <10 ug/mL — ABNORMAL LOW (ref 10–30)

## 2014-05-30 MED ORDER — ACETAMINOPHEN 325 MG PO TABS: 650.0000 mg | ORAL_TABLET | ORAL | Status: DC | PRN

## 2014-05-30 MED ORDER — TRAZODONE HCL 50 MG PO TABS
50.0000 mg | ORAL_TABLET | Freq: Every evening | ORAL | Status: DC | PRN
Start: 2014-05-30 — End: 2014-05-31

## 2014-05-30 MED ORDER — ONDANSETRON HCL 4 MG PO TABS: 4.0000 mg | ORAL_TABLET | Freq: Three times a day (TID) | ORAL | Status: DC | PRN

## 2014-05-30 MED ORDER — ASENAPINE MALEATE 5 MG SL SUBL
5.0000 mg | SUBLINGUAL_TABLET | Freq: Two times a day (BID) | SUBLINGUAL | Status: DC
  Filled 2014-05-30 (×3): qty 1

## 2014-05-30 MED ORDER — ALBUTEROL SULFATE HFA 108 (90 BASE) MCG/ACT IN AERS: 1.0000 | INHALATION_SPRAY | Freq: Four times a day (QID) | RESPIRATORY_TRACT | Status: DC | PRN

## 2014-05-30 NOTE — ED Notes (Signed)
Pt is scrubed out and has been wanded by security.

## 2014-05-30 NOTE — ED Notes (Signed)
Sp[facility] with Eln[facility]yl [facility] -Monitor for 6 hours post ingestion-which was at [employer]1930 -Observe for agitation -Seizure precautions -Hypertension -Observe for QRS widening/treat with Bicarb 1-2 meq/kg if needed -Benzo's for seizure/agitation -Hallucinations

## 2014-05-30 NOTE — ED Notes (Signed)
Patient now has a new ACT team member--who called EMS and [employer]  IVC papers to be taken out on patient

## 2014-05-30 NOTE — ED Notes (Signed)
Bed: RESB Expected date:  Expected time:  Means of arrival:  Comments: EMS 24 yo male took unknown amount benadryl

## 2014-05-30 NOTE — ED Notes (Signed)
Patient well known to this ED for psych issues  Patient states that he took half a bottle of Benadryl while drinking ETOH Patient ambulatory from EMS bay Patient in NAD

## 2014-05-30 NOTE — ED Provider Notes (Signed)
CSN: [id number]     Arrival date & time 05/30/14  2056 History  This chart was scribed for non-physician provider Elpidio AnisShari Irelyn Perfecto, PA-C, working with Rolland PorterMark James, MD by Phillis HaggisGabriella Gaje, ED Scribe. This patient was seen in room [facility] and patient care was started at 10:05 PM.    Chief Complaint  Patient presents with  . Ingestion  . Suicidal   Patient is a 24 y.o. male presenting with Ingested Medication. The history is provided by the patient. No language interpreter was used.  Ingestion This is a new problem. Associated symptoms include abdominal pain. Pertinent negatives include no chest pain, no headaches and no shortness of breath.  HPI Comments: [name] is a 24 y.o. male who presents to the Emergency Department complaining of ingestion onset earlier this evening. He states that he took "a lot of Benadryl" (cannot quantify) around 7:30 pm with alcohol. When asked if he was trying to kill himself, he reports "more or less, the voices told me to do it." He reports mild abdominal pain tonight and one episode of vomiting. Patient denies diarrhea. He states that he is a smoker.    Past Medical History  Diagnosis Date  . Asthma   . Bipolar 1 disorder   . ODD (oppositional defiant disorder)   . ADHD (attention deficit hyperactivity disorder)   . Schizophrenia   . Suicidal ideation   . Homicidal ideation   . Explosive personality disorder    History reviewed. No pertinent past surgical history. Family History  Problem Relation Age of Onset  . Asthma Mother   . Asthma Sister   . Thyroid disease     History  Substance Use Topics  . Smoking status: Never Smoker   . Smokeless tobacco: Not on file  . Alcohol Use: 0.0 oz/week    0 Standard drinks or equivalent per week    Review of Systems  Constitutional: Negative for fever.  HENT: Negative for trouble swallowing.   Respiratory: Negative for shortness of breath.   Cardiovascular: Negative for chest pain.  Gastrointestinal:  Positive for vomiting and abdominal pain. Negative for diarrhea.  Musculoskeletal: Negative for myalgias.  Skin: Negative for rash and wound.  Neurological: Negative for headaches.  Psychiatric/Behavioral: Positive for suicidal ideas and hallucinations.   Allergies  Carbamazepine; Geodon; Haldol; Strattera; Other; and Shellfish allergy  Home Medications   Prior to Admission medications   Medication Sig Start Date End Date Taking? Authorizing Provider  albuterol (PROVENTIL HFA;VENTOLIN HFA) 108 (90 BASE) MCG/ACT inhaler Inhale 1 puff into the lungs every 6 (six) hours as needed for wheezing or shortness of breath. 03/25/14  Yes Deepak Advani, MD  asenapine (SAPHRIS) 5 MG SUBL 24 hr tablet Place 5 mg under the tongue 2 (two) times daily.   Yes Historical Provider, MD  QUEtiapine (SEROQUEL) 100 MG tablet Take 100 mg by mouth 2 (two) times daily as needed (for mood).   Yes Historical Provider, MD  traZODone (DESYREL) 50 MG tablet Take 50 mg by mouth at bedtime as needed for sleep (sleep).    Yes Historical Provider, MD  benztropine (COGENTIN) 1 MG tablet Take 1 tablet (1 mg total) by mouth 2 (two) times daily. Patient not taking: Reported on 05/18/2014 10/13/13   Beau FannyJohn C Withrow, FNP  DiphenhydrAMINE HCl (BENADRYL PO) Take 1 tablet by mouth once.    Historical Provider, MD  EPINEPHrine (EPIPEN 2-PAK IJ) Inject as directed.    Historical Provider, MD  fluticasone (FLOVENT HFA) 110 MCG/ACT inhaler Inhale 2  puffs into the lungs every 12 (twelve) hours. 03/25/14   Doris Cheadle, MD  ibuprofen (ADVIL,MOTRIN) 200 MG tablet Take 200 mg by mouth every 6 (six) hours as needed (harm himself).    Historical Provider, MD  Pseudoephedrine-APAP-DM (DAYQUIL PO) Take 30 mLs by mouth once.    Historical Provider, MD  Vitamin D, Ergocalciferol, (DRISDOL) 50000 UNITS CAPS capsule Take 1 capsule (50,000 Units total) by mouth every 7 (seven) days. Patient not taking: Reported on 05/18/2014 03/30/14   Doris Cheadle, MD    BP 141/90 mmHg  Pulse 93  Temp(Src) 98.2 F (36.8 C) (Oral)  Resp 20  SpO2 99% Physical Exam  Constitutional: He is oriented to person, place, and time. He appears well-developed and well-nourished. No distress.  HENT:  Head: Normocephalic and atraumatic.  Eyes: Conjunctivae and EOM are normal.  Neck: Normal range of motion. Neck supple.  Cardiovascular: Normal rate, regular rhythm and normal heart sounds.   Pulmonary/Chest: Effort normal and breath sounds normal.  Musculoskeletal: Normal range of motion. He exhibits no edema.  Neurological: He is alert and oriented to person, place, and time.  Skin: Skin is warm and dry.  Psychiatric: He is actively hallucinating. He exhibits a depressed mood. He expresses suicidal ideation. He expresses no homicidal ideation.  Nursing note and vitals reviewed.   ED Course  Procedures (including critical care time) DIAGNOSTIC STUDIES: Oxygen Saturation is 99% on room air, normal by my interpretation.    COORDINATION OF CARE: 10:10 PM-Discussed treatment plan which includes behavioral health evaluation with pt at bedside and pt agreed to plan.   Labs Review Labs Reviewed  ACETAMINOPHEN LEVEL - Abnormal; Notable for the following:    Acetaminophen (Tylenol), Serum <10.0 (*)    All other components within normal limits  CBC  COMPREHENSIVE METABOLIC PANEL  ETHANOL  SALICYLATE LEVEL  URINE RAPID DRUG SCREEN (HOSP PERFORMED)  ACETAMINOPHEN LEVEL   Results for orders placed or performed during the hospital encounter of 05/30/14  CBC  Result Value Ref Range   WBC 7.1 4.0 - 10.5 K/uL   RBC 5.72 4.22 - 5.81 MIL/uL   Hemoglobin 15.7 13.0 - 17.0 g/dL   HCT 16.1 09.6 - 04.5 %   MCV 81.8 78.0 - 100.0 fL   MCH 27.4 26.0 - 34.0 pg   MCHC 33.5 30.0 - 36.0 g/dL   RDW 40.9 81.1 - 91.4 %   Platelets 326 150 - 400 K/uL  Comprehensive metabolic panel  Result Value Ref Range   Sodium 141 135 - 145 mmol/L   Potassium 4.0 3.5 - 5.1 mmol/L    Chloride 103 96 - 112 mmol/L   CO2 29 19 - 32 mmol/L   Glucose, Bld 91 70 - 99 mg/dL   BUN 9 6 - 23 mg/dL   Creatinine, Ser 7.82 0.50 - 1.35 mg/dL   Calcium 9.4 8.4 - 95.6 mg/dL   Total Protein 8.2 6.0 - 8.3 g/dL   Albumin 4.6 3.5 - 5.2 g/dL   AST 30 0 - 37 U/L   ALT 36 0 - 53 U/L   Alkaline Phosphatase 79 39 - 117 U/L   Total Bilirubin 0.5 0.3 - 1.2 mg/dL   GFR calc non Af Amer >90 >90 mL/min   GFR calc Af Amer >90 >90 mL/min   Anion gap 9 5 - 15  Ethanol (ETOH)  Result Value Ref Range   Alcohol, Ethyl (B) <5 0 - 9 mg/dL  Acetaminophen level  Result Value Ref Range  Acetaminophen (Tylenol), Serum <10.0 (L) 10 - 30 ug/mL  Salicylate level  Result Value Ref Range   Salicylate Lvl <4.0 2.8 - 20.0 mg/dL  Urine rapid drug screen (hosp performed)  Result Value Ref Range   Opiates NONE DETECTED NONE DETECTED   Cocaine NONE DETECTED NONE DETECTED   Benzodiazepines NONE DETECTED NONE DETECTED   Amphetamines NONE DETECTED NONE DETECTED   Tetrahydrocannabinol NONE DETECTED NONE DETECTED   Barbiturates NONE DETECTED NONE DETECTED  Acetaminophen level  Result Value Ref Range   Acetaminophen (Tylenol), Serum <10.0 (L) 10 - 30 ug/mL    Imaging Review No results found.   EKG Interpretation None      MDM   Final diagnoses:  None   1. Suicidal ideation 2. Auditory hallucinations 3. H/o schizphrenia  VSS, no tachycardia 5 hours after alleged overdose of benadryl. Four-hour Tylenol level done per Poison Control recommendation. Feel he is stable for medical clearance and TTS consult.  I personally performed the services described in this documentation, which was scribed in my presence. The recorded information has been reviewed and is accurate.     Elpidio Anis, PA-C 05/31/14 0003  Azalia Bilis, MD 05/31/14 1610  Azalia Bilis, MD 05/31/14 [id number]

## 2014-05-31 MED ORDER — LORAZEPAM 2 MG/ML IJ SOLN
2.0000 mg | Freq: Once | INTRAMUSCULAR | Status: AC
  Administered 2014-05-31: 2 mg via INTRAMUSCULAR
  Filled 2014-05-31: qty 1

## 2014-05-31 NOTE — ED Notes (Signed)
Patient tossed bedding and mattress out of his room. States "I don't want it". All items removed from patient room at present.

## 2014-05-31 NOTE — BH Assessment (Addendum)
Tele Assessment Note   [name] is an 24 y.o. male who was IVC'd by his ACT team, [facility].  Pt ingested an unknown quantity of Benydryl today and states he did it trying to get high not to kill himself.  Pt denied HI, SH urges or AVH at the time of the assessment.  Pt stated he was hearing voices earlier today that he said were telling him to kill himself and that is why he was trying to get high.  Pt lives with his mother within walking distance of the hospital. Pt said he became agitated tonight once at the ED because he heard ED staff talking about him in a negative way.  Pt stated he planned to write a letter to [facility] and Susa Loffler to complain.  Pt stated he has a hx of intentional cutting but he stated he has not cut himself in over 1 year. Pt fired [employer] as his ACT team and is just starting with [facility] as of 3/28 per [employer].  Pt has a hx of multiple IP stays since the age of 21 and multiple OP periods of treatment with multiple providers of service. Pt advised no access to guns or weapons. Pt stated known triggers for him are others talking about him negatively and someone yelling at him. Pt stated he gets about 8 hours a night of sleep and he has not gained or lost weight in the last month.  Pt was dressed in scrubs and sitting on a hospital bed for the assessment.  He was talkative, irritable at times and at times pleasant.  His movement seemed restless and he moved continually throughout the assessment. Pt's speech was rapid and pressured, coherent and relevant.   Pt's thought processes were at times, circumstantial. Pt was oriented x 4.  Axis I:Biolar I Disorder by hx; Schizophrenia by hx; ADHD by hx; ODD by hx Axis II: Deferred Axis III:  Past Medical History  Diagnosis Date  . Asthma   . Bipolar 1 disorder   . ODD (oppositional defiant disorder)   . ADHD (attention deficit hyperactivity disorder)   . Schizophrenia   . Suicidal ideation   . Homicidal ideation   . Explosive  personality disorder    Axis IV: other psychosocial or environmental problems, problems related to social environment and problems with primary support group Axis V: 11-20 some danger of hurting self or others possible OR occasionally fails to maintain minimal personal hygiene OR gross impairment in communication  Past Medical History:  Past Medical History  Diagnosis Date  . Asthma   . Bipolar 1 disorder   . ODD (oppositional defiant disorder)   . ADHD (attention deficit hyperactivity disorder)   . Schizophrenia   . Suicidal ideation   . Homicidal ideation   . Explosive personality disorder     History reviewed. No pertinent past surgical history.  Family History:  Family History  Problem Relation Age of Onset  . Asthma Mother   . Asthma Sister   . Thyroid disease      Social History:  reports that he has never smoked. He does not have any smokeless tobacco history on file. He reports that he drinks alcohol. He reports that he does not use illicit drugs.  Additional Social History:  Alcohol / Drug Use Prescriptions: See PTA list History of alcohol / drug use?:  (Pt says he only drinks red wine; sometimes "a little" approx. 1/2 bottle; "sometimes a lot" about a bottle) Longest period of  sobriety (when/how long): unknown Substance #1 Name of Substance 1: Alcohol 1 - Age of First Use: teens 1 - Amount (size/oz): "not sure" 1 - Frequency: "every day" 1 - Duration: "long time" 1 - Last Use / Amount: "today"  CIWA: CIWA-Ar BP: 126/83 mmHg Pulse Rate: 94 COWS:    PATIENT STRENGTHS: (choose at least two) Communication skills Supportive family/friends  Allergies:  Allergies  Allergen Reactions  . Carbamazepine Anaphylaxis, Other (See Comments) and Cough    Cough up blood  . Geodon [Ziprasidone Hcl] Anaphylaxis and Other (See Comments)    " Causes my throat to close"  . Haldol [Haloperidol Lactate] Anaphylaxis and Other (See Comments)    Patient reports that he  stopped taking this because of throat swelling.   [name]. Strattera [Atomoxetine] Anaphylaxis, Other (See Comments) and Cough    Cough up blood  . Other Nausea Only and Other (See Comments)    Apple Juice, stomach hurts and itchy throat   . Shellfish Allergy Swelling    Throat swelling    Home Medications:  (Not in a hospital admission)  OB/GYN Status:  No LMP for male patient.  General Assessment Data Location of Assessment: [facility] Is this a Tele or Face-to-Face Assessment?: Tele Assessment Is this an Initial Assessment or a Re-assessment for this encounter?: Initial Assessment Living Arrangements: Parent (Mom) Can pt return to current living arrangement?: Yes Admission Status: Involuntary Is patient capable of signing voluntary admission?: No Transfer from: Home Referral Source: Psychiatrist     [facility] Living Arrangements: Parent (Mom) Name of Psychiatrist: [facility] Name of Therapist: [facility]  Education Status Is patient currently in school?: No  Risk to self with the past 6 months Suicidal Ideation: No (denies) Suicidal Intent: No (denies) Is patient at risk for suicide?: Yes Suicidal Plan?: No (states he took some Benydryl to "get high" not to kill himse) Specify Current Suicidal Plan: na Access to Means: Yes Specify Access to Suicidal Means: Benydryl What has been your use of drugs/alcohol within the last 12 months?: per pt daily drinking alcohol Previous Attempts/Gestures: Yes How many times?:  (multiple) Other Self Harm Risks: cutting  (pt sstated he stopped as of approx. 1 year ago) Triggers for Past Attempts: Unpredictable (Others talking about him and judging him per pt) Intentional Self Injurious Behavior: Cutting (pt stated he stopped approx 1 yr ago) Family Suicide History: Unknown Recent stressful life event(s):  (none noted) Persecutory voices/beliefs?: Yes (at times pt says he hears voices that criticize him) Depression: No Depression Symptoms:   (none noted today) Substance abuse history and/or treatment for substance abuse?: Yes Suicide prevention information given to non-admitted patients: Not applicable  Risk to Others within the past 6 months Homicidal Ideation: No (denies) Thoughts of Harm to Others: No (denies) Comment - Thoughts of Harm to Others: hx of aggeression toward others (tonight threw bed linens off bed) Current Homicidal Intent: No Current Homicidal Plan: No Access to Homicidal Means: No (denies) Identified Victim: na History of harm to others?:  (Aggression, no actual harm to others noted) Assessment of Violence: None Noted Does patient have access to weapons?: No (denies) Criminal Charges Pending?: No Does patient have a court date: No  Psychosis Hallucinations: Auditory (Earlier today voices telling him to kill himself) Delusions: Grandiose, Persecutory  Mental Status Report Appearance/Hygiene: In scrubs, Unremarkable Eye Contact: Good Motor Activity: Agitation, Restlessness Speech: Logical/coherent, Rapid, Pressured, Loud Level of Consciousness: Alert Mood: Suspicious, Irritable, Labile, Pleasant, Threatening Affect: Blunted, Angry, Labile  Anxiety Level: Minimal Thought Processes: Coherent, Relevant, Tangential Judgement: Impaired Orientation: Person, Place, Time, Situation Obsessive Compulsive Thoughts/Behaviors: Unable to Assess  Cognitive Functioning Concentration: Fair Memory: Recent Intact IQ: Average Insight: Poor Impulse Control: Poor Appetite: Good Weight Loss: 0 Weight Gain: 0 Sleep: No Change Total Hours of Sleep: 8 Vegetative Symptoms: Unable to Assess  ADLScreening [facility]) Patient's cognitive ability adequate to safely complete daily activities?: Yes Patient able to express need for assistance with ADLs?: Yes Independently performs ADLs?: Yes (appropriate for developmental age)  Prior Inpatient Therapy Prior Inpatient Therapy: Yes Prior Therapy Dates:  multiple since young child Prior Therapy Facilty/Provider(s): [facility], [facility], others Reason for Treatment: Psychosis, Aggression. SI  Prior Outpatient Therapy Prior Outpatient Therapy: Yes Prior Therapy Dates: Ongoing Prior Therapy Facilty/Provider(s): [facility], [facility] Reason for Treatment: Med Mgmt, Therapy  ADL Screening (condition at time of admission) Patient's cognitive ability adequate to safely complete daily activities?: Yes Patient able to express need for assistance with ADLs?: Yes Independently performs ADLs?: Yes (appropriate for developmental age)       Abuse/Neglect Assessment (Assessment to be complete while patient is alone) Physical Abuse: Denies Verbal Abuse: Denies Sexual Abuse: Denies Exploitation of patient/patient's resources: Denies Self-Neglect: Denies     Merchant navy officer (For Healthcare) Does patient have an advance directive?: No    Additional Information 1:1 In Past 12 Months?: Yes CIRT Risk: Yes Elopement Risk: Yes Does patient have medical clearance?: Yes     Disposition:  Disposition Initial Assessment Completed for this Encounter: Yes Disposition of Patient: Other dispositions (Pending review w [facility] Extender) Other disposition(s): Other (Comment)  Per Georjean Mode, NP for [facility]:  Pt IVC'd.  Have him re-evaluated in the AM by psychiatry.    Spoke with Dr. Dawna Part, [facility]:  Advised of [facility] recommendation to hold in ED until morning to be re-evaluated by psychiatry for possible discharge.  Advised that pt stated that he was not trying to kill himself but, was trying to get high with ingesting the Benydryll.  Pt advised no HI; AH earlier today but, not at time of assessment.  Dr. advised he would discharge.   Beryle Flock, MS, CRC, [facility] Triage Specialist [facility] T 05/31/2014 1:34 AM

## 2014-05-31 NOTE — ED Notes (Signed)
Pt. Calm now. Has completed call from TTS. Pt. Asked that the EDP be called since he is no longer having SI or HI. Dr. Patria Maneampos called. He states he will address this request after he hears from TTS.

## 2014-05-31 NOTE — ED Notes (Signed)
Pt. To [facility] from ED ambulatory with security and [employer] to room 38. Pt. Loud and uncooperative. Pt. Refuses to talk to TTS. TTS to call EDP concerning pt. Refusal to speak to them.

## 2014-05-31 NOTE — ED Notes (Signed)
Pt started to talk loudly in the hallway, cursing.  States one of the staff was talking "shit about me.  She does not know that I can hear her."  [employer] and security at bedside.

## 2014-05-31 NOTE — [facility] Counselor (Signed)
12:35 AM- Called [facility] to have them set up TA equipment.  Nurse Stanford BreedMacon advised [facility] and to wait until moved, approximately 15-20 mins.    12:45 AM- Called [facility] talked to RN Jillyn HiddenGary to ask him to have TA equipment set up for assessment.  He said no because pt said that he would not talk to anyone.  Asked Jillyn HiddenGary to have TTS consult taken out and he said "you can ask the doctor to take it out."  I called Dr. Dawna PartKampos (number Jillyn HiddenGary gave me) and advised. He said he would go and talk to pt and see if he could get him to speak to me for assessment.  He advised he would call me back after seeing the pt.  Beryle FlockMary Toddrick Sanna, MS, CRC, [facility] Triage Specialist [facility]

## 2014-06-01 ENCOUNTER — Encounter (HOSPITAL_COMMUNITY): Payer: Self-pay | Admitting: Emergency Medicine

## 2014-06-01 ENCOUNTER — Emergency Department (HOSPITAL_COMMUNITY)
Admission: EM | Admit: 2014-06-01 | Discharge: 2014-06-01 | Disposition: A | Discharge status: Home / Self Care | Payer: Medicaid Other | Attending: Emergency Medicine | Admitting: Emergency Medicine

## 2014-06-01 ENCOUNTER — Emergency Department (EMERGENCY_DEPARTMENT_HOSPITAL)
Admission: EM | Admit: 2014-06-01 | Discharge: 2014-06-02 | Disposition: A | Discharge status: Home / Self Care | Payer: Medicaid Other | Source: Home / Self Care | Attending: Emergency Medicine | Admitting: Emergency Medicine

## 2014-06-01 ENCOUNTER — Encounter (HOSPITAL_COMMUNITY): Payer: Self-pay | Admitting: *Deleted

## 2014-06-01 DIAGNOSIS — F250 Schizoaffective disorder, bipolar type: Secondary | ICD-10-CM | POA: Diagnosis present

## 2014-06-01 DIAGNOSIS — R4182 Altered mental status, unspecified: Secondary | ICD-10-CM | POA: Diagnosis present

## 2014-06-01 DIAGNOSIS — J45909 Unspecified asthma, uncomplicated: Secondary | ICD-10-CM | POA: Insufficient documentation

## 2014-06-01 DIAGNOSIS — R45851 Suicidal ideations: Secondary | ICD-10-CM

## 2014-06-01 DIAGNOSIS — Z7951 Long term (current) use of inhaled steroids: Secondary | ICD-10-CM | POA: Diagnosis not present

## 2014-06-01 DIAGNOSIS — Z79899 Other long term (current) drug therapy: Secondary | ICD-10-CM | POA: Insufficient documentation

## 2014-06-01 DIAGNOSIS — F4324 Adjustment disorder with disturbance of conduct: Secondary | ICD-10-CM | POA: Diagnosis not present

## 2014-06-01 LAB — CBC
HCT: 44.1 % (ref 39.0–52.0)
HEMATOCRIT: 46.9 % (ref 39.0–52.0)
Hemoglobin: 15.6 g/dL (ref 13.0–17.0)
Hemoglobin: 15 g/dL (ref 13.0–17.0)
MCH: 27.2 pg (ref 26.0–34.0)
MCH: 27.1 pg (ref 26.0–34.0)
MCHC: 33.3 g/dL (ref 30.0–36.0)
MCHC: 34 g/dL (ref 30.0–36.0)
MCV: 81.8 fL (ref 78.0–100.0)
MCV: 79.7 fL (ref 78.0–100.0)
PLATELETS: 316 10*3/uL (ref 150–400)
Platelets: 292 10*3/uL (ref 150–400)
RBC: 5.73 MIL/uL (ref 4.22–5.81)
RBC: 5.53 MIL/uL (ref 4.22–5.81)
RDW: 14.1 % (ref 11.5–15.5)
RDW: 13.9 % (ref 11.5–15.5)
WBC: 6.7 10*3/uL (ref 4.0–10.5)
WBC: 5.5 10*3/uL (ref 4.0–10.5)

## 2014-06-01 LAB — COMPREHENSIVE METABOLIC PANEL
ALK PHOS: 79 U/L (ref 39–117)
ALT: 34 U/L (ref 0–53)
ALT: 32 U/L (ref 0–53)
AST: 25 U/L (ref 0–37)
AST: 30 U/L (ref 0–37)
Albumin: 5 g/dL (ref 3.5–5.2)
Albumin: 4.5 g/dL (ref 3.5–5.2)
Alkaline Phosphatase: 78 U/L (ref 39–117)
Anion gap: 9 (ref 5–15)
Anion gap: 13 (ref 5–15)
BUN: 7 mg/dL (ref 6–23)
BUN: 6 mg/dL (ref 6–23)
CO2: 30 mmol/L (ref 19–32)
CO2: 26 mmol/L (ref 19–32)
CREATININE: 1.06 mg/dL (ref 0.50–1.35)
Calcium: 9.4 mg/dL (ref 8.4–10.5)
Calcium: 9.4 mg/dL (ref 8.4–10.5)
Chloride: 99 mmol/L (ref 96–112)
Chloride: 101 mmol/L (ref 96–112)
Creatinine, Ser: 1.17 mg/dL (ref 0.50–1.35)
GFR calc Af Amer: >90 mL/min (ref >90)
GFR calc Af Amer: >90 mL/min (ref >90)
GFR calc non Af Amer: >90 mL/min (ref >90)
GFR calc non Af Amer: 87 mL/min — ABNORMAL LOW (ref >90)
Glucose, Bld: 99 mg/dL (ref 70–99)
Glucose, Bld: 93 mg/dL (ref 70–99)
POTASSIUM: 3.7 mmol/L (ref 3.5–5.1)
Potassium: 4 mmol/L (ref 3.5–5.1)
Sodium: 138 mmol/L (ref 135–145)
Sodium: 140 mmol/L (ref 135–145)
TOTAL PROTEIN: 8.8 g/dL — AB (ref 6.0–8.3)
Total Bilirubin: 0.6 mg/dL (ref 0.3–1.2)
Total Bilirubin: 0.8 mg/dL (ref 0.3–1.2)
Total Protein: 7.3 g/dL (ref 6.0–8.3)

## 2014-06-01 LAB — ETHANOL: Alcohol, Ethyl (B): <5 mg/dL (ref 0–9)

## 2014-06-01 LAB — SALICYLATE LEVEL: Salicylate Lvl: <4 mg/dL (ref 2.8–20.0)

## 2014-06-01 LAB — ACETAMINOPHEN LEVEL
Acetaminophen (Tylenol), Serum: <10 ug/mL — ABNORMAL LOW (ref 10–30)
Acetaminophen (Tylenol), Serum: <10 ug/mL — ABNORMAL LOW (ref 10–30)

## 2014-06-01 LAB — RAPID URINE DRUG SCREEN, HOSP PERFORMED
Amphetamines: NOT DETECTED
Barbiturates: NOT DETECTED
Benzodiazepines: POSITIVE — AB
Cocaine: NOT DETECTED
Opiates: NOT DETECTED
Tetrahydrocannabinol: NOT DETECTED

## 2014-06-01 MED ORDER — ACETAMINOPHEN 325 MG PO TABS
650.0000 mg | ORAL_TABLET | ORAL | Status: DC | PRN
  Administered 2014-06-02: 650 mg via ORAL
  Filled 2014-06-01: qty 2

## 2014-06-01 MED ORDER — NICOTINE 21 MG/24HR TD PT24
21.0000 mg | MEDICATED_PATCH | Freq: Every day | TRANSDERMAL | Status: DC
  Administered 2014-06-01: 21 mg via TRANSDERMAL
  Filled 2014-06-01 (×2): qty 1

## 2014-06-01 MED ORDER — MUSCLE RUB 10-15 % EX CREA
TOPICAL_CREAM | CUTANEOUS | Status: DC | PRN
  Administered 2014-06-01: 23:00:00 via TOPICAL
  Administered 2014-06-02: 1 via TOPICAL
  Filled 2014-06-01: qty 85

## 2014-06-01 MED ORDER — ALUM & MAG HYDROXIDE-SIMETH 200-200-20 MG/5ML PO SUSP: 30.0000 mL | ORAL | Status: DC | PRN

## 2014-06-01 MED ORDER — IBUPROFEN 400 MG PO TABS: 600.0000 mg | ORAL_TABLET | Freq: Three times a day (TID) | ORAL | Status: DC | PRN

## 2014-06-01 MED ORDER — ONDANSETRON HCL 4 MG PO TABS: 4.0000 mg | ORAL_TABLET | Freq: Three times a day (TID) | ORAL | Status: DC | PRN

## 2014-06-01 NOTE — Progress Notes (Signed)
Per psychiatrist, patient psychiatrically stable for discharge home. Pt to follow up with Actt team at his home later today. Pt to be given bus pass home.    Olga CoasterKristen Dhruvi Crenshaw, LCSW  Clinical Social Work  [facility] [phone]

## 2014-06-01 NOTE — ED Provider Notes (Signed)
CSN: [id number]     Arrival date & time 06/01/14  0005 History   First MD Initiated Contact with Patient 06/01/14 [phone]     Chief Complaint  Patient presents with  . Suicidal     (Consider location/radiation/quality/duration/timing/severity/associated sxs/prior Treatment) Patient is a 24 y.o. male presenting with altered mental status. The history is provided by the patient (the pt states he heard voices to hurt himself and took some benadryl and no he is not hearing voices).  Altered Mental Status Presenting symptoms: behavior changes   Severity:  Mild Most recent episode:  Today Episode history:  Multiple Timing:  Intermittent Progression:  Resolved Chronicity:  Recurrent Context: alcohol use   Associated symptoms: no abdominal pain, no hallucinations, no headaches, no rash and no seizures     Past Medical History  Diagnosis Date  . Asthma   . Bipolar 1 disorder   . ODD (oppositional defiant disorder)   . ADHD (attention deficit hyperactivity disorder)   . Schizophrenia   . Suicidal ideation   . Homicidal ideation   . Explosive personality disorder    History reviewed. No pertinent past surgical history. Family History  Problem Relation Age of Onset  . Asthma Mother   . Asthma Sister   . Thyroid disease     History  Substance Use Topics  . Smoking status: Never Smoker   . Smokeless tobacco: Not on file  . Alcohol Use: 0.0 oz/week    0 Standard drinks or equivalent per week    Review of Systems  Constitutional: Negative for appetite change and fatigue.  HENT: Negative for congestion, ear discharge and sinus pressure.   Eyes: Negative for discharge.  Respiratory: Negative for cough.   Cardiovascular: Negative for chest pain.  Gastrointestinal: Negative for abdominal pain and diarrhea.  Genitourinary: Negative for frequency and hematuria.  Musculoskeletal: Negative for back pain.  Skin: Negative for rash.  Neurological: Negative for seizures and headaches.   Psychiatric/Behavioral: Negative for hallucinations.      Allergies  Carbamazepine; Geodon; Haldol; Strattera; Other; and Shellfish allergy  Home Medications   Prior to Admission medications   Medication Sig Start Date End Date Taking? Authorizing Provider  albuterol (PROVENTIL HFA;VENTOLIN HFA) 108 (90 BASE) MCG/ACT inhaler Inhale 1 puff into the lungs every 6 (six) hours as needed for wheezing or shortness of breath. 03/25/14  Yes Doris Cheadle, MD  DiphenhydrAMINE HCl (BENADRYL PO) Take 1 tablet by mouth as needed (sleep,allergies).    Yes Historical Provider, MD  EPINEPHrine (EPIPEN 2-PAK IJ) Inject 1 each as directed as needed (anaphylaxis).    Yes Historical Provider, MD  fluticasone (FLOVENT HFA) 110 MCG/ACT inhaler Inhale 2 puffs into the lungs every 12 (twelve) hours. 03/25/14  Yes Doris Cheadle, MD  QUEtiapine (SEROQUEL) 100 MG tablet Take 100 mg by mouth 2 (two) times daily as needed (for mood).   Yes Historical Provider, MD  benztropine (COGENTIN) 1 MG tablet Take 1 tablet (1 mg total) by mouth 2 (two) times daily. Patient not taking: Reported on 05/18/2014 10/13/13   Beau Fanny, FNP  Vitamin D, Ergocalciferol, (DRISDOL) 50000 UNITS CAPS capsule Take 1 capsule (50,000 Units total) by mouth every 7 (seven) days. Patient not taking: Reported on 05/18/2014 03/30/14   Doris Cheadle, MD   BP 140/82 mmHg  Pulse 85  Temp(Src) 98.4 F (36.9 C) (Oral)  Resp 18  SpO2 100% Physical Exam  Constitutional: He is oriented to person, place, and time. He appears well-developed.  HENT:  Head:  Normocephalic.  Eyes: Conjunctivae and EOM are normal. No scleral icterus.  Neck: Neck supple. No thyromegaly present.  Cardiovascular: Normal rate and regular rhythm.  Exam reveals no gallop and no friction rub.   No murmur heard. Pulmonary/Chest: No stridor. He has no wheezes. He has no rales. He exhibits no tenderness.  Abdominal: He exhibits no distension. There is no tenderness. There is no  rebound.  Musculoskeletal: Normal range of motion. He exhibits no edema.  Lymphadenopathy:    He has no cervical adenopathy.  Neurological: He is oriented to person, place, and time. He exhibits normal muscle tone. Coordination normal.  Not suicidal or homicidal  Skin: No rash noted. No erythema.  Psychiatric: He has a normal mood and affect. His behavior is normal.    ED Course  Procedures (including critical care time) Labs Review Labs Reviewed  ACETAMINOPHEN LEVEL - Abnormal; Notable for the following:    Acetaminophen (Tylenol), Serum <10.0 (*)    All other components within normal limits  COMPREHENSIVE METABOLIC PANEL - Abnormal; Notable for the following:    Total Protein 8.8 (*)    All other components within normal limits  CBC  ETHANOL  SALICYLATE LEVEL  URINE RAPID DRUG SCREEN (HOSP PERFORMED)    Imaging Review No results found.   EKG Interpretation   Date/Time:  Tuesday June 01 2014 08:39:02 EDT Ventricular Rate:  76 PR Interval:  148 QRS Duration: 95 QT Interval:  377 QTC Calculation: 424 R Axis:   49 Text Interpretation:  Sinus rhythm Confirmed by Chennel Olivos  MD, Azeneth Carbonell ([id number])  on 06/01/2014 11:44:21 AM      MDM   Final diagnoses:  Schizoaffective disorder, bipolar type    Pt seen by tts,   Pt was d/ced home for out pt tx   Bethann BerkshireJoseph Lurlie Wigen, MD 06/01/14 1144

## 2014-06-01 NOTE — ED Provider Notes (Signed)
CSN: [id number]     Arrival date & time 06/01/14  1655 History   First MD Initiated Contact with Patient 06/01/14 1822     Chief Complaint  Patient presents with  . Suicidal     (Consider location/radiation/quality/duration/timing/severity/associated sxs/prior Treatment) HPI Comments: Patient with history of schizophrenia, bipolar disorder, multiple recent visits to the emergency department for suicidality and hallucinations -- presents with complaint of suicidal ideation as well as voices telling him to kill himself and to kill other people. Patient states that he took approximately #30  Benadryl just prior to arrival. Patient denies other health complaints at the current time. Denies other substance use.   Review of records show that patient was seen at [facility] emergency department and had a psychiatric evaluation done this morning. He also stated that he was suicidal and took Benadryl at time of intake. Psych note reports that patient had no suicidality and was having no suicidal ideation at the time of their exam. He was discharged into the care of his community ACT team.   The history is provided by the patient and medical records.    Past Medical History  Diagnosis Date  . Asthma   . Bipolar 1 disorder   . ODD (oppositional defiant disorder)   . ADHD (attention deficit hyperactivity disorder)   . Schizophrenia   . Suicidal ideation   . Homicidal ideation   . Explosive personality disorder    History reviewed. No pertinent past surgical history. Family History  Problem Relation Age of Onset  . Asthma Mother   . Asthma Sister   . Thyroid disease     History  Substance Use Topics  . Smoking status: Never Smoker   . Smokeless tobacco: Not on file  . Alcohol Use: 0.0 oz/week    0 Standard drinks or equivalent per week    Review of Systems  Constitutional: Negative for fever.  HENT: Negative for rhinorrhea and sore throat.   Eyes: Negative for redness.   Respiratory: Negative for cough.   Cardiovascular: Negative for chest pain.  Gastrointestinal: Negative for nausea, vomiting, abdominal pain and diarrhea.  Genitourinary: Negative for dysuria.  Musculoskeletal: Negative for myalgias.  Skin: Negative for rash.  Neurological: Negative for headaches.  Psychiatric/Behavioral: Positive for suicidal ideas and hallucinations.    Allergies  Carbamazepine; Geodon; Haldol; Strattera; Other; and Shellfish allergy  Home Medications   Prior to Admission medications   Medication Sig Start Date End Date Taking? Authorizing Provider  albuterol (PROVENTIL HFA;VENTOLIN HFA) 108 (90 BASE) MCG/ACT inhaler Inhale 1 puff into the lungs every 6 (six) hours as needed for wheezing or shortness of breath. 03/25/14  Yes Doris Cheadle, MD  DiphenhydrAMINE HCl (BENADRYL PO) Take 1 tablet by mouth as needed (sleep,allergies).    Yes Historical Provider, MD  EPINEPHrine (EPIPEN 2-PAK IJ) Inject 1 each as directed as needed (anaphylaxis).    Yes Historical Provider, MD  fluticasone (FLOVENT HFA) 110 MCG/ACT inhaler Inhale 2 puffs into the lungs every 12 (twelve) hours. 03/25/14  Yes Doris Cheadle, MD  QUEtiapine (SEROQUEL) 100 MG tablet Take 100 mg by mouth 2 (two) times daily as needed (for mood).   Yes Historical Provider, MD  Vitamin D, Ergocalciferol, (DRISDOL) 50000 UNITS CAPS capsule Take 1 capsule (50,000 Units total) by mouth every 7 (seven) days. Patient not taking: Reported on 05/18/2014 03/30/14   Doris Cheadle, MD   BP 151/93 mmHg  Pulse 93  Temp(Src) 99.1 F (37.3 C) (Oral)  Resp 18  SpO2  97%   Physical Exam  Constitutional: He appears well-developed and well-nourished.  HENT:  Head: Normocephalic and atraumatic.  Eyes: Conjunctivae are normal. Right eye exhibits no discharge. Left eye exhibits no discharge.  Neck: Normal range of motion. Neck supple.  Cardiovascular: Normal rate, regular rhythm and normal heart sounds.   Pulmonary/Chest: Effort  normal and breath sounds normal. No respiratory distress. He has no wheezes. He has no rales.  Abdominal: Soft. There is no tenderness.  Neurological: He is alert.  Skin: Skin is warm and dry.  Psychiatric: He has a normal mood and affect. His speech is normal and behavior is normal. Thought content is delusional. He expresses homicidal and suicidal ideation. He expresses suicidal plans. He expresses no homicidal plans.  Nursing note and vitals reviewed.   ED Course  Procedures (including critical care time) Labs Review Labs Reviewed  COMPREHENSIVE METABOLIC PANEL - Abnormal; Notable for the following:    GFR calc non Af Amer 87 (*)    All other components within normal limits  ACETAMINOPHEN LEVEL - Abnormal; Notable for the following:    Acetaminophen (Tylenol), Serum <10.0 (*)    All other components within normal limits  URINE RAPID DRUG SCREEN (HOSP PERFORMED) - Abnormal; Notable for the following:    Benzodiazepines POSITIVE (*)    All other components within normal limits  CBC  ETHANOL  SALICYLATE LEVEL    Imaging Review No results found.   EKG Interpretation None       7:26 PM Patient seen and examined. Labs reviewed. Patient is currently exhibiting no significant signs of a anticholinergic overdose.  Discussed with Dr. Juleen ChinaKohut.   Reviewed psych assessment from earlier today. In that note, states patient denies suicidal or homicidal ideation as well as hallucinations. In contrast to this, patient is currently complaining of all of these. At this point, do not feel that it is safe for him to be discharged based on previous assessment until he is cleared to do so with these new reported symptoms.   Vital signs reviewed and are as follows: BP 151/93 mmHg  Pulse 93  Temp(Src) 99.1 F (37.3 C) (Oral)  Resp 18  SpO2 97%  11:43 PM Spoke with TTS. Recc psych eval in AM.   Patient is medically cleared. I have low suspicion that he took benadryl based on his clinical  presentation.   MDM   Final diagnoses:  Suicidal ideation  Schizoaffective disorder, bipolar type   Pending psych eval in AM to determine final disposition. Patient seen multiple times with similar complaints.      Renne CriglerJoshua Judeth Gilles, PA-C 06/01/14 2348  Raeford RazorStephen Kohut, MD 06/02/14 [phone]

## 2014-06-01 NOTE — ED Notes (Signed)
Triage to send pt's belongings to Pod C.

## 2014-06-01 NOTE — Consult Note (Signed)
[place] [facility] Consult   Reason for Consult:  Reported suicidal ideations to his ACT team Referring Physician:  EDP Patient Identification: [name] MRN:  [id number] Principal Diagnosis: Schizoaffective disorder, bipolar type Diagnosis:   Patient Active Problem List   Diagnosis Date Noted  . Schizoaffective disorder, bipolar type [F25.0]     Priority: High  . Drug overdose, intentional [T50.902A] 10/09/2013    Priority: High  . Aggression [F60.89] 08/22/2013    Priority: High  . 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 Asthma, chronic [J45.909] 03/25/2014  . Obesity, morbid [E66.01] 03/25/2014    Total Time spent with patient: 45 minutes  Subjective:   [name] is a 24 y.o. male patient does not warrant admission.  HPI:  The patient had a good weekend with his dad who visited.  When he left, he called the ACT team and said he was suicidal.  He claims he was just expressing himself and the "next thing I know the police were there two minutes later."  Corrin denies suicidal/homicidal ideations, hallucinations, and alcohol/drug abuse issues today.  He is well known to this facility and providers.  His ACT team was called and will come to take [place] home, stable for discharge. HPI Elements:   Location:  generalized. Quality:  acute. Severity:  mild. Timing:  intermittent. Duration:  brief. Context:  stressors.  Past Medical History:  Past Medical History  Diagnosis Date  . Asthma   . Bipolar 1 disorder   . ODD (oppositional defiant disorder)   . ADHD (attention deficit hyperactivity disorder)   . Schizophrenia   . Suicidal ideation   . Homicidal ideation   . Explosive personality disorder    History reviewed. No pertinent past surgical history. Family History:  Family History  Problem Relation Age of Onset  . Asthma Mother   . Asthma Sister   . Thyroid disease     Social History:  History  Alcohol Use  . 0.0 oz/week  .  0 Standard drinks or equivalent per week     History  Drug Use No    Comment: Patient denies     History   Social History  . Marital Status: Single    Spouse Name: N/A  . Number of Children: N/A  . Years of Education: N/A   Social History Main Topics  . Smoking status: Never Smoker   . Smokeless tobacco: Not on file  . Alcohol Use: 0.0 oz/week    0 Standard drinks or equivalent per week  . Drug Use: No     Comment: Patient denies   . Sexual Activity: Not on file   Other Topics Concern  . None   Social History Narrative   Additional Social History:                          Allergies:   Allergies  Allergen Reactions  . Carbamazepine Anaphylaxis, Other (See Comments) and Cough    Cough up blood  . Geodon [Ziprasidone Hcl] Anaphylaxis and Other (See Comments)    " Causes my throat to close"  . Haldol [Haloperidol Lactate] Anaphylaxis and Other (See Comments)    Patient reports that he stopped taking this because of throat swelling.   Christianne Borrow [Atomoxetine] Anaphylaxis, Other (See Comments) and Cough    Cough up blood  . Other Nausea Only and Other (See Comments)    Apple Juice, stomach hurts and itchy throat   .  Shellfish Allergy Swelling    Throat swelling    Labs:  Results for orders placed or performed during the hospital encounter of 06/01/14 (from the past 48 hour(s))  Acetaminophen level     Status: Abnormal   Collection Time: 06/01/14  7:20 AM  Result Value Ref Range   Acetaminophen (Tylenol), Serum <10.0 (L) 10 - 30 ug/mL    Comment:        THERAPEUTIC CONCENTRATIONS VARY SIGNIFICANTLY. A RANGE OF 10-30 ug/mL MAY BE AN EFFECTIVE CONCENTRATION FOR MANY PATIENTS. HOWEVER, SOME ARE BEST TREATED AT CONCENTRATIONS OUTSIDE THIS RANGE. ACETAMINOPHEN CONCENTRATIONS >150 ug/mL AT 4 HOURS AFTER INGESTION AND >50 ug/mL AT 12 HOURS AFTER INGESTION ARE OFTEN ASSOCIATED WITH TOXIC REACTIONS.   CBC     Status: None   Collection Time: 06/01/14   7:20 AM  Result Value Ref Range   WBC 6.7 4.0 - 10.5 K/uL   RBC 5.73 4.22 - 5.81 MIL/uL   Hemoglobin 15.6 13.0 - 17.0 g/dL   HCT 46.9 39.0 - 52.0 %   MCV 81.8 78.0 - 100.0 fL   MCH 27.2 26.0 - 34.0 pg   MCHC 33.3 30.0 - 36.0 g/dL   RDW 14.1 11.5 - 15.5 %   Platelets 316 150 - 400 K/uL  Comprehensive metabolic panel     Status: Abnormal   Collection Time: 06/01/14  7:20 AM  Result Value Ref Range   Sodium 138 135 - 145 mmol/L   Potassium 3.7 3.5 - 5.1 mmol/L   Chloride 99 96 - 112 mmol/L   CO2 30 19 - 32 mmol/L   Glucose, Bld 99 70 - 99 mg/dL   BUN 7 6 - 23 mg/dL   Creatinine, Ser 1.06 0.50 - 1.35 mg/dL   Calcium 9.4 8.4 - 10.5 mg/dL   Total Protein 8.8 (H) 6.0 - 8.3 g/dL   Albumin 5.0 3.5 - 5.2 g/dL   AST 25 0 - 37 U/L   ALT 34 0 - 53 U/L   Alkaline Phosphatase 79 39 - 117 U/L   Total Bilirubin 0.6 0.3 - 1.2 mg/dL   GFR calc non Af Amer >90 >90 mL/min   GFR calc Af Amer >90 >90 mL/min    Comment: (NOTE) The eGFR has been calculated using the CKD EPI equation. This calculation has not been validated in all clinical situations. eGFR's persistently <90 mL/min signify possible Chronic Kidney Disease.    Anion gap 9 5 - 15  Ethanol (ETOH)     Status: None   Collection Time: 06/01/14  7:20 AM  Result Value Ref Range   Alcohol, Ethyl (B) <5 0 - 9 mg/dL    Comment:        LOWEST DETECTABLE LIMIT FOR SERUM ALCOHOL IS 11 mg/dL FOR MEDICAL PURPOSES ONLY   Salicylate level     Status: None   Collection Time: 06/01/14  7:20 AM  Result Value Ref Range   Salicylate Lvl <9.8 2.8 - 20.0 mg/dL    Vitals: Blood pressure 133/91, pulse 81, temperature 98.4 F (36.9 C), temperature source Oral, resp. rate 18, SpO2 100 %.  Risk to Self: Is patient at risk for suicide?: Yes Risk to Others:   Prior Inpatient Therapy:   Prior Outpatient Therapy:    No current facility-administered medications for this encounter.   Current Outpatient Prescriptions  Medication Sig Dispense Refill   . albuterol (PROVENTIL HFA;VENTOLIN HFA) 108 (90 BASE) MCG/ACT inhaler Inhale 1 puff into the lungs every 6 (six) hours as  needed for wheezing or shortness of breath. 18 g 3  . DiphenhydrAMINE HCl (BENADRYL PO) Take 1 tablet by mouth as needed (sleep,allergies).     . EPINEPHrine (EPIPEN 2-PAK IJ) Inject 1 each as directed as needed (anaphylaxis).     . fluticasone (FLOVENT HFA) 110 MCG/ACT inhaler Inhale 2 puffs into the lungs every 12 (twelve) hours. 1 Inhaler 12  . QUEtiapine (SEROQUEL) 100 MG tablet Take 100 mg by mouth 2 (two) times daily as needed (for mood).    . benztropine (COGENTIN) 1 MG tablet Take 1 tablet (1 mg total) by mouth 2 (two) times daily. (Patient not taking: Reported on 05/18/2014) 60 tablet 0  . Vitamin D, Ergocalciferol, (DRISDOL) 50000 UNITS CAPS capsule Take 1 capsule (50,000 Units total) by mouth every 7 (seven) days. (Patient not taking: Reported on 05/18/2014) 12 capsule 0   Musculoskeletal: Strength & Muscle Tone: within normal limits Gait & Station: normal Patient leans: N/A  Psychiatric Specialty Exam:     Blood pressure 133/91, pulse 81, temperature 98.4 F (36.9 C), temperature source Oral, resp. rate 18, SpO2 100 %.There is no weight on file to calculate BMI.  General Appearance: Casual  Eye Contact::  Good  Speech:  Normal Rate409  Volume:  Normal  Mood:  Euthymic  Affect:  Congruent  Thought Process:  Coherent  Orientation:  Full (Time, Place, and Person)  Thought Content:  WDL  Suicidal Thoughts:  No  Homicidal Thoughts:  No  Memory:  Immediate;   Good Recent;   Good Remote;   Good  Judgement:  Fair  Insight:  Fair  Psychomotor Activity:  Normal  Concentration:  Good  Recall:  Good  Fund of Knowledge:Good  Language: Good  Akathisia:  No  Handed:  Right  AIMS (if indicated):     Assets:  Catering manager Housing Leisure Time Physical Health Resilience Social Support  Sleep:     Cognition: WNL  ADL's:  Intact     Medical Decision Making: Review of Psycho-Social Stressors (1), Review or order clinical lab tests (1) and Review of Medication Regimen & Side Effects (2)  Treatment Plan Summary: Daily contact with patient to assess and evaluate symptoms and progress in treatment, Medication management and Plan Discharge to ACT team  Plan:  No evidence of imminent risk to self or others at present.   Disposition: Discharge  Waylan Boga, [place] 06/01/2014 10:04 AM

## 2014-06-01 NOTE — ED Notes (Signed)
Patient is refusing to have the EKG.

## 2014-06-01 NOTE — ED Notes (Signed)
Pt has changed into scrubs and security has wanded the pt and now wanding pt's belongings

## 2014-06-01 NOTE — ED Notes (Signed)
STATES HE CONTACTED HIS NEW ACT TEAM PRIOR TO ARRIVAL AND WAS ADVISED TO COME TO ED. STATES WANTS VOICES TO STOP. STATES THEY DID FOR A 70-MONTH PERIOD WHEN HE WAS PUT ON ATIVAN AND ZYPREXA. STATES HE WANTS TO BE PRESCRIBED THIS MEDS.

## 2014-06-01 NOTE — [facility] Suicide Risk Assessment (Signed)
Suicide Risk Assessment  Discharge Assessment   [facility]   Demographic Factors:  Male and Adolescent or young adult  Total Time spent with patient: 45 minutes  Musculoskeletal: Strength & Muscle Tone: within normal limits Gait & Station: normal Patient leans: N/A  Psychiatric Specialty Exam:     Blood pressure 133/91, pulse 81, temperature 98.4 F (36.9 C), temperature source Oral, resp. rate 18, SpO2 100 %.There is no weight on file to calculate BMI.  General Appearance: Casual  Eye Contact::  Good  Speech:  Normal Rate409  Volume:  Normal  Mood:  Euthymic  Affect:  Congruent  Thought Process:  Coherent  Orientation:  Full (Time, Place, and Person)  Thought Content:  WDL  Suicidal Thoughts:  No  Homicidal Thoughts:  No  Memory:  Immediate;   Good Recent;   Good Remote;   Good  Judgement:  Fair  Insight:  Fair  Psychomotor Activity:  Normal  Concentration:  Good  Recall:  Good  Fund of Knowledge:Good  Language: Good  Akathisia:  No  Handed:  Right  AIMS (if indicated):     Assets:  Health and safety inspectorinancial Resources/Insurance Housing Leisure Time Physical Health Resilience Social Support  Sleep:     Cognition: WNL  ADL's:  Intact      Has this patient used any form of tobacco in the last 30 days? (Cigarettes, Smokeless Tobacco, Cigars, and/or Pipes) No  Mental Status Per Nursing Assessment::   On Admission:   Reported suicidal ideations to his ACT team prior to admission  Current Mental Status by Physician: NA  Loss Factors: NA  Historical Factors: NA  Risk Reduction Factors:   Sense of responsibility to family, Living with another person, especially a relative, Positive social support and Positive therapeutic relationship  Continued Clinical Symptoms:  None  Cognitive Features That Contribute To Risk:  None    Suicide Risk:  Minimal: No identifiable suicidal ideation.  Patients presenting with no risk factors but with  morbid ruminations; may be classified as minimal risk based on the severity of the depressive symptoms  Principal Problem: Schizoaffective disorder, bipolar type Discharge Diagnoses:  Patient Active Problem List   Diagnosis Date Noted  . Schizoaffective disorder, bipolar type [F25.0]     Priority: High  . Drug overdose, intentional [T50.902A] 10/09/2013    Priority: High  . Aggression [F60.89] 08/22/2013    Priority: High  . 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 Asthma, chronic [J45.909] 03/25/2014  . Obesity, morbid [E66.01] 03/25/2014      Plan Of Care/Follow-up recommendations:  Activity:  as tolerated Diet:  heart healthy diet  Is patient on multiple antipsychotic therapies at discharge:  No   Has Patient had three or more failed trials of antipsychotic monotherapy by history:  No  Recommended Plan for Multiple Antipsychotic Therapies: NA    Kenn Rekowski, PMH-NP 06/01/2014, 9:59 AM

## 2014-06-01 NOTE — ED Notes (Signed)
TTS cart at bedside. 

## 2014-06-01 NOTE — ED Notes (Signed)
Pt given burgundy scrubs and non slips socks to place on; security on hand to wand pt

## 2014-06-01 NOTE — BH Assessment (Addendum)
Per ED notes pt was seen earlier today and d/c back to ACT team. He return to ED stating he contacted his ACT and was told to come to ED, as was having SI with planning.   Requested cart be placed with pt for assessment.   Assessment to commence shortly.   First attempt at 2119 no answer.   Second attempt 2123 cart answered and was on Peds, told both carts are working but cart 1 and 2 were on Peds. Someone is on way to move cart to Pod C. Assessment will begin in a few minutes.   [name] BernhardtNancy Dorian Singleton, [facility] T[facility] Specialist 06/01/2014 9:15 PM

## 2014-06-01 NOTE — ED Notes (Signed)
J Geiple, PA, in w/pt 

## 2014-06-01 NOTE — ED Notes (Signed)
Pt to ED c/o being suicidal.  St's he is hearing voices.  St's he has a plan of taking all his pills.

## 2014-06-01 NOTE — ED Notes (Signed)
Pt states that he took 10 25mg  tabs of Benadryl this pm around 5pm; pt states that this was an Suicide attempt; pt c/o SI; pt alert and oriented

## 2014-06-01 NOTE — ED Notes (Signed)
NO PT BELONGINGS BROUGHT TO POD C W/PT.
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
## 2014-06-02 ENCOUNTER — Emergency Department (EMERGENCY_DEPARTMENT_HOSPITAL)
Admission: EM | Admit: 2014-06-02 | Discharge: 2014-06-04 | Disposition: A | Discharge status: Home / Self Care | Payer: Medicaid Other | Source: Home / Self Care | Attending: Emergency Medicine | Admitting: Emergency Medicine

## 2014-06-02 ENCOUNTER — Emergency Department (HOSPITAL_COMMUNITY)
Admission: EM | Admit: 2014-06-02 | Discharge: 2014-06-02 | Disposition: A | Discharge status: Home / Self Care | Payer: Medicaid Other | Attending: Emergency Medicine | Admitting: Emergency Medicine

## 2014-06-02 ENCOUNTER — Encounter (HOSPITAL_COMMUNITY): Payer: Self-pay | Admitting: *Deleted

## 2014-06-02 DIAGNOSIS — J45909 Unspecified asthma, uncomplicated: Secondary | ICD-10-CM | POA: Insufficient documentation

## 2014-06-02 DIAGNOSIS — R45851 Suicidal ideations: Secondary | ICD-10-CM | POA: Insufficient documentation

## 2014-06-02 DIAGNOSIS — F250 Schizoaffective disorder, bipolar type: Secondary | ICD-10-CM | POA: Diagnosis not present

## 2014-06-02 DIAGNOSIS — Z79899 Other long term (current) drug therapy: Secondary | ICD-10-CM | POA: Insufficient documentation

## 2014-06-02 DIAGNOSIS — Z008 Encounter for other general examination: Secondary | ICD-10-CM | POA: Diagnosis present

## 2014-06-02 DIAGNOSIS — F319 Bipolar disorder, unspecified: Secondary | ICD-10-CM | POA: Insufficient documentation

## 2014-06-02 MED ORDER — FLUTICASONE PROPIONATE HFA 110 MCG/ACT IN AERO: 2.0000 | INHALATION_SPRAY | Freq: Two times a day (BID) | RESPIRATORY_TRACT | Status: DC

## 2014-06-02 MED ORDER — ALBUTEROL SULFATE HFA 108 (90 BASE) MCG/ACT IN AERS
1.0000 | INHALATION_SPRAY | Freq: Four times a day (QID) | RESPIRATORY_TRACT | Status: DC | PRN
  Administered 2014-06-02: 1 via RESPIRATORY_TRACT
  Filled 2014-06-02: qty 6.7

## 2014-06-02 MED ORDER — DIPHENHYDRAMINE HCL 25 MG PO CAPS
25.0000 mg | ORAL_CAPSULE | Freq: Every evening | ORAL | Status: DC | PRN
  Administered 2014-06-02 – 2014-06-03 (×2): 25 mg via ORAL
  Filled 2014-06-02 (×2): qty 1

## 2014-06-02 MED ORDER — FLUTICASONE PROPIONATE HFA 110 MCG/ACT IN AERO
2.0000 | INHALATION_SPRAY | Freq: Two times a day (BID) | RESPIRATORY_TRACT | Status: DC
  Administered 2014-06-02: 2 via RESPIRATORY_TRACT
  Filled 2014-06-02: qty 12

## 2014-06-02 MED ORDER — VITAMIN D (ERGOCALCIFEROL) 1.25 MG (50000 UNIT) PO CAPS
50000.0000 [IU] | ORAL_CAPSULE | ORAL | Status: DC
  Administered 2014-06-03: 50000 [IU] via ORAL
  Filled 2014-06-02: qty 1

## 2014-06-02 MED ORDER — FLUTICASONE PROPIONATE HFA 110 MCG/ACT IN AERO
2.0000 | INHALATION_SPRAY | Freq: Two times a day (BID) | RESPIRATORY_TRACT | Status: DC
  Administered 2014-06-02 – 2014-06-04 (×3): 2 via RESPIRATORY_TRACT
  Filled 2014-06-02: qty 12

## 2014-06-02 MED ORDER — QUETIAPINE FUMARATE 100 MG PO TABS
100.0000 mg | ORAL_TABLET | Freq: Two times a day (BID) | ORAL | Status: DC | PRN
  Administered 2014-06-02: 100 mg via ORAL
  Filled 2014-06-02: qty 1

## 2014-06-02 MED ORDER — ALBUTEROL SULFATE HFA 108 (90 BASE) MCG/ACT IN AERS: 1.0000 | INHALATION_SPRAY | Freq: Four times a day (QID) | RESPIRATORY_TRACT | Status: DC | PRN

## 2014-06-02 MED ORDER — FLUTICASONE PROPIONATE HFA 44 MCG/ACT IN AERO: 2.0000 | INHALATION_SPRAY | Freq: Two times a day (BID) | RESPIRATORY_TRACT | Status: DC

## 2014-06-02 NOTE — ED Provider Notes (Signed)
Patient is alert cooperative pleasant Glasgow Coma Score 15. Denies wanting to harm self or others. Evaluated by psychiatry here. He was see his ACT team upon arrival home Results for orders placed or performed during the hospital encounter of 06/01/14  CBC  Result Value Ref Range   WBC 5.5 4.0 - 10.5 K/uL   RBC 5.53 4.22 - 5.81 MIL/uL   Hemoglobin 15.0 13.0 - 17.0 g/dL   HCT 21.344.1 08.639.0 - 57.852.0 %   MCV 79.7 78.0 - 100.0 fL   MCH 27.1 26.0 - 34.0 pg   MCHC 34.0 30.0 - 36.0 g/dL   RDW 46.913.9 62.911.5 - 52.815.5 %   Platelets 292 150 - 400 K/uL  Comprehensive metabolic panel  Result Value Ref Range   Sodium 140 135 - 145 mmol/L   Potassium 4.0 3.5 - 5.1 mmol/L   Chloride 101 96 - 112 mmol/L   CO2 26 19 - 32 mmol/L   Glucose, Bld 93 70 - 99 mg/dL   BUN 6 6 - 23 mg/dL   Creatinine, Ser 4.131.17 0.50 - 1.35 mg/dL   Calcium 9.4 8.4 - 24.410.5 mg/dL   Total Protein 7.3 6.0 - 8.3 g/dL   Albumin 4.5 3.5 - 5.2 g/dL   AST 30 0 - 37 U/L   ALT 32 0 - 53 U/L   Alkaline Phosphatase 78 39 - 117 U/L   Total Bilirubin 0.8 0.3 - 1.2 mg/dL   GFR calc non Af Amer 87 (L) >90 mL/min   GFR calc Af Amer >90 >90 mL/min   Anion gap 13 5 - 15  Ethanol (ETOH)  Result Value Ref Range   Alcohol, Ethyl (B) <5 0 - 9 mg/dL  Acetaminophen level  Result Value Ref Range   Acetaminophen (Tylenol), Serum <10.0 (L) 10 - 30 ug/mL  Salicylate level  Result Value Ref Range   Salicylate Lvl <4.0 2.8 - 20.0 mg/dL  Urine rapid drug screen (hosp performed)  Result Value Ref Range   Opiates NONE DETECTED NONE DETECTED   Cocaine NONE DETECTED NONE DETECTED   Benzodiazepines POSITIVE (A) NONE DETECTED   Amphetamines NONE DETECTED NONE DETECTED   Tetrahydrocannabinol NONE DETECTED NONE DETECTED   Barbiturates NONE DETECTED NONE DETECTED   No results found.   Doug SouSam Amberrose Friebel, MD 06/02/14 1039

## 2014-06-02 NOTE — Progress Notes (Signed)
CSW spoke with [facility] ACTT representative, informing him patient has been seen by psychiatry this morning and has been recommended for d/c per Renata Capriceonrad, NP. [facility] state they are unable to transport pt home this morning but will meet with him this afternoon.   [facility] CSW to provide bus pass facilitating pt's transportation home.  [name] SkillMeghan Chequita Singleton, MSW, LCSWA Clinical Social Work, Disposition  06/02/2014 [phone]

## 2014-06-02 NOTE — ED Notes (Signed)
Dr Patria Maneampos saw pt in triage and discharged pt

## 2014-06-02 NOTE — ED Notes (Signed)
All belongings and valuables returned to patient.

## 2014-06-02 NOTE — Discharge Instructions (Signed)
Depression If you feel that you might hurt herself or someone else, return immediately. Your ACT team member will see you later today Depression is feeling sad, low, down in the dumps, blue, gloomy, or empty. In general, there are two kinds of depression:  Normal sadness or grief. This can happen after something upsetting. It often goes away on its own within 2 weeks. After losing a loved one (bereavement), normal sadness and grief may last longer than two weeks. It usually gets better with time.  Clinical depression. This kind lasts longer than normal sadness or grief. It keeps you from doing the things you normally do in life. It is often hard to function at home, work, or at school. It may affect your relationships with others. Treatment is often needed. GET HELP RIGHT AWAY IF:  You have thoughts about hurting yourself or others.  You lose touch with reality (psychotic symptoms). You may:  See or hear things that are not real.  Have untrue beliefs about your life or people around you.  Your medicine is giving you problems. MAKE SURE YOU:  Understand these instructions.  Will watch your condition.  Will get help right away if you are not doing well or get worse. Document Released: 03/17/2010 Document Revised: 06/29/2013 Document Reviewed: 06/14/2011 [facility] Patient Information 2015 [place]. This information is not intended to replace advice given to you by your health care provider. Make sure you discuss any questions you have with your health care provider.

## 2014-06-02 NOTE — Progress Notes (Addendum)
[facility]. Aware patient DC. Offered patient bus pass and he refused, reports "I will find my own way home". Patient upset he has to leave hospital. [facility] and also will complete a care coordination referral as patient has not had one and has been in ED 10 times.  [facility] called for CC.  Completed referral for patient.  It will take 7 days to staff and make decision if patient accepted.  Will follow up with this writer once outcome determined.  Deretha EmoryHannah Dezaray Shibuya LCSW, MSW Clinical Social Work: Emergency Room [phone]

## 2014-06-02 NOTE — BH Assessment (Signed)
Per ED notes [facility] and was brought back to ED by police because he was walking in traffic trying to get hit by a car. Reports he took overdose of benadryl as well.   Assessment to commence shortly.   Clista BernhardtNancy Dariush Mcnellis, [facility] Triage Specialist 06/02/2014 9:55 PM

## 2014-06-02 NOTE — ED Notes (Signed)
tts machine to room for psych reeval

## 2014-06-02 NOTE — Consult Note (Signed)
[place]   Reason for Consult:  Reported suicidal ideations  Referring Physician:  EDP Patient Identification: [name] MRN:  [id number] Principal Diagnosis: Schizoaffective disorder, bipolar type Diagnosis:   Patient Active Problem List   Diagnosis Date Noted  . 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 Schizoaffective disorder, bipolar type [F25.0]   . Asthma, chronic [J45.909] 03/25/2014  . Obesity, morbid [E66.01] 03/25/2014  . Drug overdose, intentional [T50.902A] 10/09/2013  . Aggression [F60.89] 08/22/2013    Total Time spent with patient: 25 minutes  Subjective:   [name] is a 24 y.o. male patient does not warrant admission.Pt has been to the ED 10 times in the past 6 months and over 5 times in the past 2 weeks alone. Pt continues to present with the same statements each time and is known to have chronic statements about suicidal ideation. Pt is at his baseline at this time and is well-known to this NP as well as other [facility] staff. Pt continues to repeat that he is having suicidal thoughts to this NP, yet he has denied it to everyone else. Pt's ACT Team is coming to pick him up and agrees to be responsible for his care; they also opine that he is at baseline.   HPI:  The patient had a good weekend with his dad who visited.  When he left, he called the ACT team and said he was suicidal.  He claims he was just expressing himself and the "next thing I know the police were there two minutes later."  [name] denies suicidal/homicidal ideations, hallucinations, and alcohol/drug abuse issues today.  He is well known to this facility and providers.  His ACT team was called and will come to take [name] home, stable for discharge. HPI Elements:   Location:  generalized. Quality:  acute. Severity:  mild. Timing:  intermittent. Duration:  brief. Context:  stressors.  Past Medical History:  Past Medical History  Diagnosis Date  . Asthma   .  Bipolar 1 disorder   . ODD (oppositional defiant disorder)   . ADHD (attention deficit hyperactivity disorder)   . Schizophrenia   . Suicidal ideation   . Homicidal ideation   . Explosive personality disorder    History reviewed. No pertinent past surgical history. Family History:  Family History  Problem Relation Age of Onset  . Asthma Mother   . Asthma Sister   . Thyroid disease     Social History:  History  Alcohol Use  . 0.0 oz/week  . 0 Standard drinks or equivalent per week     History  Drug Use No    Comment: Patient denies     History   Social History  . Marital Status: Single    Spouse Name: N/A  . Number of Children: N/A  . Years of Education: N/A   Social History Main Topics  . Smoking status: Never Smoker   . Smokeless tobacco: Not on file  . Alcohol Use: 0.0 oz/week    0 Standard drinks or equivalent per week  . Drug Use: No     Comment: Patient denies   . Sexual Activity: Not on file   Other Topics Concern  . None   Social History Narrative   Additional Social History:    Pain Medications: SEE PTA Prescriptions: SEE PTA Over the Counter: SEE PTA  History of alcohol / drug use?: Yes Longest period of sobriety (when/how long): none, "maybe a day or so" no hx  of seizures Negative Consequences of Use: Financial, Personal relationships Withdrawal Symptoms:  (none reported) Name of Substance 1: etoh 1 - Age of First Use: 20 1 - Amount (size/oz): 1-2 87 once [place] 47 up to a bottle of red wine or half a bottle of Vodka 1 - Frequency: every other day 1 - Duration: years 1 - Last Use / Amount: 05-31-14 nearly a whole bottle of red wine per pt                   Allergies:   Allergies  Allergen Reactions  . Carbamazepine Anaphylaxis, Other (See Comments) and Cough    Cough up blood  . Geodon [Ziprasidone Hcl] Anaphylaxis and Other (See Comments)    " Causes my throat to close"  . Haldol [Haloperidol Lactate] Anaphylaxis and Other (See  Comments)    Patient reports that he stopped taking this because of throat swelling.   Christianne Borrow [Atomoxetine] Anaphylaxis, Other (See Comments) and Cough    Cough up blood  . Other Nausea Only and Other (See Comments)    Apple Juice, stomach hurts and itchy throat   . Shellfish Allergy Swelling    Throat swelling    Labs:  Results for orders placed or performed during the hospital encounter of 06/01/14 (from the past 48 hour(s))  Urine rapid drug screen (hosp performed)     Status: Abnormal   Collection Time: 06/01/14  5:25 PM  Result Value Ref Range   Opiates NONE DETECTED NONE DETECTED   Cocaine NONE DETECTED NONE DETECTED   Benzodiazepines POSITIVE (A) NONE DETECTED   Amphetamines NONE DETECTED NONE DETECTED   Tetrahydrocannabinol NONE DETECTED NONE DETECTED   Barbiturates NONE DETECTED NONE DETECTED    Comment:        DRUG SCREEN FOR MEDICAL PURPOSES ONLY.  IF CONFIRMATION IS NEEDED FOR ANY PURPOSE, NOTIFY LAB WITHIN 5 DAYS.        LOWEST DETECTABLE LIMITS FOR URINE DRUG SCREEN Drug Class       Cutoff (ng/mL) Amphetamine      1000 Barbiturate      200 Benzodiazepine   725 Tricyclics       366 Opiates          300 Cocaine          300 THC              50   CBC     Status: None   Collection Time: 06/01/14  5:32 PM  Result Value Ref Range   WBC 5.5 4.0 - 10.5 K/uL   RBC 5.53 4.22 - 5.81 MIL/uL   Hemoglobin 15.0 13.0 - 17.0 g/dL   HCT 44.1 39.0 - 52.0 %   MCV 79.7 78.0 - 100.0 fL   MCH 27.1 26.0 - 34.0 pg   MCHC 34.0 30.0 - 36.0 g/dL   RDW 13.9 11.5 - 15.5 %   Platelets 292 150 - 400 K/uL  Comprehensive metabolic panel     Status: Abnormal   Collection Time: 06/01/14  5:32 PM  Result Value Ref Range   Sodium 140 135 - 145 mmol/L   Potassium 4.0 3.5 - 5.1 mmol/L   Chloride 101 96 - 112 mmol/L   CO2 26 19 - 32 mmol/L   Glucose, Bld 93 70 - 99 mg/dL   BUN 6 6 - 23 mg/dL   Creatinine, Ser 1.17 0.50 - 1.35 mg/dL   Calcium 9.4 8.4 - 10.5 mg/dL   Total  Protein  7.3 6.0 - 8.3 g/dL   Albumin 4.5 3.5 - 5.2 g/dL   AST 30 0 - 37 U/L   ALT 32 0 - 53 U/L   Alkaline Phosphatase 78 39 - 117 U/L   Total Bilirubin 0.8 0.3 - 1.2 mg/dL   GFR calc non Af Amer 87 (L) >90 mL/min   GFR calc Af Amer >90 >90 mL/min    Comment: (NOTE) The eGFR has been calculated using the CKD EPI equation. This calculation has not been validated in all clinical situations. eGFR's persistently <90 mL/min signify possible Chronic Kidney Disease.    Anion gap 13 5 - 15  Ethanol (ETOH)     Status: None   Collection Time: 06/01/14  5:32 PM  Result Value Ref Range   Alcohol, Ethyl (B) <5 0 - 9 mg/dL    Comment:        LOWEST DETECTABLE LIMIT FOR SERUM ALCOHOL IS 11 mg/dL FOR MEDICAL PURPOSES ONLY   Acetaminophen level     Status: Abnormal   Collection Time: 06/01/14  5:32 PM  Result Value Ref Range   Acetaminophen (Tylenol), Serum <10.0 (L) 10 - 30 ug/mL    Comment:        THERAPEUTIC CONCENTRATIONS VARY SIGNIFICANTLY. A RANGE OF 10-30 ug/mL MAY BE AN EFFECTIVE CONCENTRATION FOR MANY PATIENTS. HOWEVER, SOME ARE BEST TREATED AT CONCENTRATIONS OUTSIDE THIS RANGE. ACETAMINOPHEN CONCENTRATIONS >150 ug/mL AT 4 HOURS AFTER INGESTION AND >50 ug/mL AT 12 HOURS AFTER INGESTION ARE OFTEN ASSOCIATED WITH TOXIC REACTIONS.   Salicylate level     Status: None   Collection Time: 06/01/14  5:32 PM  Result Value Ref Range   Salicylate Lvl <9.7 2.8 - 20.0 mg/dL    Vitals: Blood pressure 117/68, pulse 73, temperature 97.8 F (36.6 C), temperature source Oral, resp. rate 18, SpO2 98 %.  Risk to Self: Suicidal Ideation: Yes-Currently Present Suicidal Intent: Yes-Currently Present Is patient at risk for suicide?: Yes Suicidal Plan?: Yes-Currently Present Specify Current Suicidal Plan: take pills and drink etoh Access to Means: Yes Specify Access to Suicidal Means: pills and etoh  What has been your use of drugs/alcohol within the last 12 months?: pt has been drinking  since age 49, and reports he drinks about every other day up to a bottle of wine, but typically 20 40 ounce beers How many times?:  (multiple, reports this started this year) Other Self Harm Risks: cutting, none reported recently, bangs head Triggers for Past Attempts:  (AVH, conflict with girl friend) Intentional Self Injurious Behavior: Cutting Comment - Self Injurious Behavior: none reported recently  Risk to Others: Homicidal Ideation: Yes-Currently Present Thoughts of Harm to Others: Yes-Currently Present Comment - Thoughts of Harm to Others: reports he is thinking of killing someone but would not say who Current Homicidal Intent: Yes-Currently Present Current Homicidal Plan: Yes-Currently Present Describe Current Homicidal Plan: stabbing with a knife Access to Homicidal Means: Yes Describe Access to Homicidal Means: reports he has access to "a whole lot of knives" and could get a gun if he chose to Identified Victim: "I'd rather not say" History of harm to others?: Yes Assessment of Violence: In past 6-12 months Violent Behavior Description: reports he was aggressive with another pt at [facility] about three months ago Does patient have access to weapons?: Yes (Comment) Criminal Charges Pending?: No Does patient have a court date: No Prior Inpatient Therapy: Prior Inpatient Therapy: Yes Prior Therapy Dates: multiple since young child Prior Therapy Facilty/Provider(s): [facility], [place], [place]  Reason for Treatment: Psychosis, Aggression. SI Prior Outpatient Therapy: Prior Outpatient Therapy: Yes Prior Therapy Dates: Ongoing Prior Therapy Facilty/Provider(s): currently with [facility], [facility] in [facility] past Reason for Treatment: Med Mgmt, Therapy  Current Facility-Administered Medications  Medication Dose Route Frequency Provider Last Rate Last Dose  . acetaminophen (TYLENOL) tablet 650 mg  650 mg Oral Q4H PRN Carlisle Cater, PA-C   650 mg at 06/02/14 0413  . albuterol (PROVENTIL HFA;VENTOLIN HFA)  108 (90 BASE) MCG/ACT inhaler 1 puff  1 puff Inhalation Q6H PRN Debby Freiberg, MD   1 puff at 06/02/14 0306  . alum & mag hydroxide-simeth (MAALOX/MYLANTA) 200-200-20 MG/5ML suspension 30 mL  30 mL Oral PRN Carlisle Cater, PA-C      . fluticasone (FLOVENT HFA) 110 MCG/ACT inhaler 2 puff  2 puff Inhalation BID Debby Freiberg, MD   2 puff at 06/02/14 0405  . ibuprofen (ADVIL,MOTRIN) tablet 600 mg  600 mg Oral Q8H PRN Carlisle Cater, PA-C      . MUSCLE RUB CREA   Topical PRN Carlisle Cater, PA-C   1 application at 16/10/96 0315  . nicotine (NICODERM CQ - dosed in mg/24 hours) patch 21 mg  21 mg Transdermal Daily Carlisle Cater, PA-C   21 mg at 06/01/14 2040  . ondansetron (ZOFRAN) tablet 4 mg  4 mg Oral Q8H PRN Carlisle Cater, PA-C       Current Outpatient Prescriptions  Medication Sig Dispense Refill  . albuterol (PROVENTIL HFA;VENTOLIN HFA) 108 (90 BASE) MCG/ACT inhaler Inhale 1 puff into the lungs every 6 (six) hours as needed for wheezing or shortness of breath. 18 g 3  . DiphenhydrAMINE HCl (BENADRYL PO) Take 1 tablet by mouth as needed (sleep,allergies).     . EPINEPHrine (EPIPEN 2-PAK IJ) Inject 1 each as directed as needed (anaphylaxis).     . fluticasone (FLOVENT HFA) 110 MCG/ACT inhaler Inhale 2 puffs into the lungs every 12 (twelve) hours. 1 Inhaler 12  . QUEtiapine (SEROQUEL) 100 MG tablet Take 100 mg by mouth 2 (two) times daily as needed (for mood).    . Vitamin D, Ergocalciferol, (DRISDOL) 50000 UNITS CAPS capsule Take 1 capsule (50,000 Units total) by mouth every 7 (seven) days. (Patient not taking: Reported on 05/18/2014) 12 capsule 0   Musculoskeletal: Strength & Muscle Tone: within normal limits Gait & Station: normal Patient leans: N/A  Psychiatric Specialty Exam:     Blood pressure 133/91, pulse 81, temperature 98.4 F (36.9 C), temperature source Oral, resp. rate 18, SpO2 100 %.There is no weight on file to calculate BMI.  General Appearance: Casual  Eye Contact::  Good   Speech:  Normal Rate409  Volume:  Normal  Mood:  Euthymic  Affect:  Congruent  Thought Process:  Coherent  Orientation:  Full (Time, Place, and Person)  Thought Content:  WDL  Suicidal Thoughts:  Yes, chronic  Homicidal Thoughts:  No  Memory:  Immediate;   Good Recent;   Good Remote;   Good  Judgement:  Fair  Insight:  Fair  Psychomotor Activity:  Normal  Concentration:  Good  Recall:  Good  Fund of Knowledge:Good  Language: Good  Akathisia:  No  Handed:  Right  AIMS (if indicated):     Assets:  Financial Resources/Insurance Housing Leisure Time Physical Health Resilience Social Support  Sleep:     Cognition: WNL  ADL's:  Intact    Medical Decision Making: Review of Psycho-Social Stressors (1), Review or order clinical lab tests (1) and Review of Medication Regimen &  Side Effects (2)  Treatment Plan Summary: Daily contact with patient to assess and evaluate symptoms and progress in treatment, Medication management and Plan Discharge to ACT team  Plan:  No evidence of imminent risk to self or others at present.   Disposition: Discharge  Guadelupe Sabin C,FNP-BC 06/02/2014 09:12 AM

## 2014-06-02 NOTE — ED Provider Notes (Signed)
CSN: [id number]     Arrival date & time 06/02/14  1814 History   None    Chief Complaint  Patient presents with  . Suicidal  . Medical Clearance      HPI Patient reports he took a handful of Benadryl approximately 10-15 minutes prior to arrival.  States he also had 5 ounces of alcohol with this.  He states his father wanted him to come in to the emergency department.  Patient is well-known to this emergency department and has frequent visits for suicidal complaints.  He's never had a true suicide attempt.  Patient denies that this was a suicide attempt and instead states he was discharged to go to sleep.  He denies homicidal or suicidal thoughts at this time.  He's been compliant with his other medications.  He has no complaints at this time.  Denies abdominal pain.  Denies difficulty urinating.   Past Medical History  Diagnosis Date  . Asthma   . Bipolar 1 disorder   . ODD (oppositional defiant disorder)   . ADHD (attention deficit hyperactivity disorder)   . Schizophrenia   . Suicidal ideation   . Homicidal ideation   . Explosive personality disorder    History reviewed. No pertinent past surgical history. Family History  Problem Relation Age of Onset  . Asthma Mother   . Asthma Sister   . Thyroid disease     History  Substance Use Topics  . Smoking status: Never Smoker   . Smokeless tobacco: Not on file  . Alcohol Use: 0.0 oz/week    0 Standard drinks or equivalent per week    Review of Systems  All other systems reviewed and are negative.     Allergies  Carbamazepine; Geodon; Haldol; Strattera; Other; and Shellfish allergy  Home Medications   Prior to Admission medications   Medication Sig Start Date End Date Taking? Authorizing Provider  albuterol (PROVENTIL HFA;VENTOLIN HFA) 108 (90 BASE) MCG/ACT inhaler Inhale 1 puff into the lungs every 6 (six) hours as needed for wheezing or shortness of breath. 03/25/14   Doris Cheadleeepak Advani, MD  DiphenhydrAMINE HCl (BENADRYL  PO) Take 1 tablet by mouth as needed (sleep,allergies).     Historical Provider, MD  EPINEPHrine (EPIPEN 2-PAK IJ) Inject 1 each as directed as needed (anaphylaxis).     Historical Provider, MD  fluticasone (FLOVENT HFA) 110 MCG/ACT inhaler Inhale 2 puffs into the lungs every 12 (twelve) hours. 03/25/14   Doris Cheadleeepak Advani, MD  QUEtiapine (SEROQUEL) 100 MG tablet Take 100 mg by mouth 2 (two) times daily as needed (for mood).    Historical Provider, MD  Vitamin D, Ergocalciferol, (DRISDOL) 50000 UNITS CAPS capsule Take 1 capsule (50,000 Units total) by mouth every 7 (seven) days. Patient not taking: Reported on 05/18/2014 03/30/14   Doris Cheadleeepak Advani, MD   BP 140/88 mmHg  Pulse 86  Temp(Src) 98.6 F (37 C) (Oral)  Resp 15  SpO2 98% Physical Exam  Constitutional: He is oriented to person, place, and time. He appears well-developed and well-nourished.  HENT:  Head: Normocephalic.  Eyes: EOM are normal.  Neck: Normal range of motion.  Pulmonary/Chest: Effort normal.  Abdominal: He exhibits no distension.  Musculoskeletal: Normal range of motion.  Neurological: He is alert and oriented to person, place, and time.  Psychiatric: He has a normal mood and affect. His speech is normal and behavior is normal. Cognition and memory are normal. He expresses no suicidal plans and no homicidal plans.  Nursing note and vitals reviewed.  ED Course  Procedures (including critical care time) Labs Review Labs Reviewed - No data to display  Imaging Review No results found.   EKG Interpretation None      MDM   Final diagnoses:  None     Nonlethal overdose.  No homicidal or suicidal thoughts.  Discharge home in good condition.   Azalia Bilis, MD 06/02/14 [phone]

## 2014-06-02 NOTE — ED Notes (Signed)
Pt reports taking unknown amount of benadryl approx 10-15 minutes again and followed it with approx 5 oz of alcohol. No acute distress noted at triage.

## 2014-06-02 NOTE — ED Notes (Signed)
Bed: [id number] Expected date:  Expected time:  Means of arrival:  Comments: Hold for [place]

## 2014-06-02 NOTE — ED Notes (Signed)
Spoke to pt's SW [employer].  He reports the same thing [employer] had reported.  [facility]  He reports pt called him as soon as [facility].  Pt is calm and cooperative at this time.
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
## 2014-06-02 NOTE — ED Notes (Signed)
[facility] reports pt attempted to jump in traffic today, states that pt reported to her that [facility] and was discharged home.  She reports that pt wanted to be [facility] to [facility] [place].  Pt also reports taking a benadryl OD.  States he took 30-40 pills.  Pt is A&Ox 4, in NAD.

## 2014-06-02 NOTE — ED Notes (Signed)
Dr Patria Manecampos at triage to speak with pt

## 2014-06-02 NOTE — ED Provider Notes (Addendum)
CSN: [id number]     Arrival date & time 06/02/14  1923 History   None    Chief Complaint  Patient presents with  . Suicide Attempt     (Consider location/radiation/quality/duration/timing/severity/associated sxs/prior Treatment) HPI   24y[facility] well [facility]. [facility] today and then came back again. [facility] home and [facility] Brought by police. Pt was picked up because he was walking in the road. He says he was trying to get hit by a car.  Also says ingested large quantity of benadryl. He estimates #30 25 mg tablets. Minimally tachycardic. HR 101. Intervals are stable to actually slightly shorter than EKG earlier today.   Past Medical History  Diagnosis Date  . Asthma   . Bipolar 1 disorder   . ODD (oppositional defiant disorder)   . ADHD (attention deficit hyperactivity disorder)   . Schizophrenia   . Suicidal ideation   . Homicidal ideation   . Explosive personality disorder    History reviewed. No pertinent past surgical history. Family History  Problem Relation Age of Onset  . Asthma Mother   . Asthma Sister   . Thyroid disease     History  Substance Use Topics  . Smoking status: Never Smoker   . Smokeless tobacco: Not on file  . Alcohol Use: 0.0 oz/week    0 Standard drinks or equivalent per week    Review of Systems  All systems reviewed and negative, other than as noted in HPI.   Allergies  Carbamazepine; Geodon; Haldol; Strattera; Other; and Shellfish allergy  Home Medications   Prior to Admission medications   Medication Sig Start Date End Date Taking? Authorizing Provider  albuterol (PROVENTIL HFA;VENTOLIN HFA) 108 (90 BASE) MCG/ACT inhaler Inhale 1 puff into the lungs every 6 (six) hours as needed for wheezing or shortness of breath. 03/25/14   Doris Cheadleeepak Advani, MD  DiphenhydrAMINE HCl (BENADRYL PO) Take 1 tablet by mouth as needed (sleep,allergies).     Historical Provider, MD  EPINEPHrine (EPIPEN 2-PAK IJ)  Inject 1 each as directed as needed (anaphylaxis).     Historical Provider, MD  fluticasone (FLOVENT HFA) 110 MCG/ACT inhaler Inhale 2 puffs into the lungs every 12 (twelve) hours. 03/25/14   Doris Cheadleeepak Advani, MD  QUEtiapine (SEROQUEL) 100 MG tablet Take 100 mg by mouth 2 (two) times daily as needed (for mood).    Historical Provider, MD  Vitamin D, Ergocalciferol, (DRISDOL) 50000 UNITS CAPS capsule Take 1 capsule (50,000 Units total) by mouth every 7 (seven) days. Patient not taking: Reported on 05/18/2014 03/30/14   Doris Cheadleeepak Advani, MD   BP 137/88 mmHg  Pulse 101  Temp(Src) 99.1 F (37.3 C) (Oral)  Resp 20  SpO2 96% Physical Exam  Constitutional: He is oriented to person, place, and time. He appears well-developed and well-nourished. No distress.  HENT:  Head: Normocephalic and atraumatic.  Eyes: Conjunctivae are normal. Right eye exhibits no discharge. Left eye exhibits no discharge.  Neck: Neck supple.  Cardiovascular: Normal rate, regular rhythm and normal heart sounds.  Exam reveals no gallop and no friction rub.   No murmur heard. Pulmonary/Chest: Effort normal and breath sounds normal. No respiratory distress.  Abdominal: Soft. He exhibits no distension. There is no tenderness.  Musculoskeletal: He exhibits no edema or tenderness.  Neurological: He is alert and oriented to person, place, and time. He exhibits normal muscle tone.  Skin: Skin is warm and dry.  Psychiatric: He has a normal mood  and affect. Thought content normal.  Nursing note and vitals reviewed.   ED Course  Procedures (including critical care time) Labs Review Labs Reviewed - No data to display  Imaging Review No results found.   EKG Interpretation   Date/Time:  Wednesday June 02 2014 19:37:32 EDT Ventricular Rate:  101 PR Interval:  111 QRS Duration: 93 QT Interval:  342 QTC Calculation: 443 R Axis:   32 Text Interpretation:  Sinus tachycardia LVH by voltage No significant  change since last tracing  Confirmed by Juleen China  MD, Kiauna Zywicki ([id number]) on  06/02/2014 11:14:12 PM      MDM   Final diagnoses:  Schizoaffective disorder, bipolar type    24yM known to ED[facility]. Picked up by police because was actually walking in traffic. Says took large amount of benadryl. Minimally tachycardic on arrival which has since resolved w/o intervention. EKG showing no acute change in intervals. He has said this on other occasions including ED visit yesterday. HR to 80s w/o intervention. Psych evalRaeford Razor, MD 06/02/14 2330

## 2014-06-03 DIAGNOSIS — F250 Schizoaffective disorder, bipolar type: Secondary | ICD-10-CM | POA: Diagnosis not present

## 2014-06-03 DIAGNOSIS — R45851 Suicidal ideations: Secondary | ICD-10-CM

## 2014-06-03 DIAGNOSIS — F4324 Adjustment disorder with disturbance of conduct: Secondary | ICD-10-CM

## 2014-06-03 MED ORDER — TUBERCULIN PPD 5 UNIT/0.1ML ID SOLN
5.0000 [IU] | Freq: Once | INTRADERMAL | Status: DC
  Administered 2014-06-03: 5 [IU] via INTRADERMAL
  Filled 2014-06-03: qty 0.1

## 2014-06-03 MED ORDER — PALIPERIDONE ER 6 MG PO TB24
6.0000 mg | ORAL_TABLET | Freq: Every day | ORAL | Status: DC
  Administered 2014-06-03 – 2014-06-04 (×2): 6 mg via ORAL
  Filled 2014-06-03 (×2): qty 1

## 2014-06-03 MED ORDER — IBUPROFEN 200 MG PO TABS
600.0000 mg | ORAL_TABLET | Freq: Four times a day (QID) | ORAL | Status: DC | PRN
  Administered 2014-06-03: 600 mg via ORAL
  Filled 2014-06-03: qty 3

## 2014-06-03 MED ORDER — GABAPENTIN 400 MG PO CAPS
400.0000 mg | ORAL_CAPSULE | Freq: Every day | ORAL | Status: DC
  Administered 2014-06-03: 400 mg via ORAL
  Filled 2014-06-03: qty 1

## 2014-06-03 MED ORDER — AMANTADINE HCL 100 MG PO CAPS
100.0000 mg | ORAL_CAPSULE | Freq: Two times a day (BID) | ORAL | Status: DC
  Administered 2014-06-03 – 2014-06-04 (×3): 100 mg via ORAL
  Filled 2014-06-03 (×4): qty 1

## 2014-06-03 MED ORDER — MUSCLE RUB 10-15 % EX CREA
TOPICAL_CREAM | CUTANEOUS | Status: DC | PRN
  Administered 2014-06-03: 13:00:00 via TOPICAL
  Filled 2014-06-03: qty 85

## 2014-06-03 NOTE — ED Notes (Signed)
Patient angry because he believes he is having a heart attack.  He has been pacing in the hallway and "rapping."  He went to his room and threw his food tray on the floor.  He also ripped his scrubs and threw paper towel on the floor in the hallway and in his room.

## 2014-06-03 NOTE — ED Notes (Signed)
Patient is calm, cooperative and respectful during assessment. Writer has therapeutic discussion with Clinical research associatewriter. Writer allows patient express himself. [facility]  Plan to walk in traffic. Patient states his medication was changed a while back and he has not been suicidal every since. Patient denies Hi and AVH at this time. Writer and patient enter in a Engineer, manufacturingverbal contract for patient to Archivistinform writer if he has increase anxiety, agitation or any concerns that he may have. Patient agrees to Archivistinform writer. Writer informs patient that she would keep him up to date on his plan of care. Encouragement and support provided and safety maintain. Q 15 min safety checks in place.

## 2014-06-03 NOTE — ED Notes (Signed)
PPD placed in left forearm.  Needs to be read Saturday April 9 after 17:10 and before Sunday April 10 at 17:10.  Please document results in the EMR.

## 2014-06-03 NOTE — ED Notes (Signed)
Acuity level remains moderate.  Continues to be at the window asking questions frequently.  Has been cooperative and can be redirected.

## 2014-06-03 NOTE — ED Notes (Signed)
Patient pleasant and cooperative with staff. Pt currently in dayroom watching t.v quietly. No s/s of distress noted at this time.

## 2014-06-03 NOTE — ED Notes (Signed)
Patient requesting something to help him sleep. Patient will be medicated per MAR. Q 15 min safety checks remain in place.

## 2014-06-03 NOTE — Progress Notes (Signed)
CSW sought intpatient placement. The following facilities were contacted.  [facility], pt declined as facility does not accept adult MCD coverage  Referral faxed: [facility]- per Burna MortimerWanda, possible bed availability [facility] Victorino DikeJennifer  At capacity: [facility]- per Cedric, check back later today or tomorrow [facility]-per Darl[facility], low acuity beds only today [facility]- [facility]- cannot accept adult MCD [facility]- cannot accept adult MCD  Ilean SkillMeghan Luana Tatro, MSW, LCSWA Clinical Social Work, Disposition  06/03/2014 [phone]
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
## 2014-06-03 NOTE — ED Notes (Signed)
Patient comes to the window demanding to see a doctor.  He stated that he was sure he was having a heart attack.  He was standing in front of me talking and his color was normal.  He was not diaphoretic nor was he having any difficulty breathing.  His vital signs were checked and found to be within defined limits.  His O2 sat was 100%.  The tech went down to his room and worked with him on deep breathing but he kept saying he did not understand how to do it.  He then started pacing in the hallway and in front of the nurses station.  He was reminded that he could not be in front of the nursing station and that he could walk in the hallway or sit in the dayroom or his room if he wanted.

## 2014-06-03 NOTE — Progress Notes (Addendum)
  CARE MANAGEMENT ED NOTE 06/03/2014  Patient:  [name],[name]   Account Number:  [id number]  Date Initiated:  06/03/2014  Documentation initiated by:  Edd ArbourGIBBS,KIMBERLY  Subjective/Objective Assessment:   24 yr [place] [facility] & then came back again. Sent home & [facility] Brought by police. Walking in the road. Also says ingested large quantity of     Subjective/Objective Assessment Detail:   benadryl. He estimates #30 25 mg tablets UDS generally positive for benzo, negative for tylenol    pcp [facility] Dr Orpah CobbAdvani  Pt with 12 [facility] ED visits in last 6 months and no admission on 06/02/14 [facility] evaluation and d/c, Later in day on 06/02/14 pt seen walking in traffic and [facility]     Action/Plan:   Pt discussed in SAPPU progression and in quality collaborative meeting Recommendation was to contac pt ACT, triage, eval, TTS and d/c CM [facility] director [name] Recommendations   Action/Plan Detail:   Discussed with ED SW 1200 SAPPU [facility] and observe overnight 06/03/14, d/c 06/04/14   Anticipated DC Date:  06/04/2014     Status Recommendation to Physician:   Result of Recommendation:    Other ED Services  Consult Working Plan    DC Planning Services  Other  Outpatient Services - Pt will follow up    Choice offered to / List presented to:            Status of service:  Completed, signed off  ED Comments:   ED Comments Detail:  06/02/14 Patient with 12 [facility] ED visits and no admissions.  Pt frequently in with c/o Overdose.  Had frequent evaluations without abnormalities. Pt discussed in SAPPU progression and in quality collaborative meeting Recommendation was to contact pt's ACT member ([facility] crisis [phone]) triage, eval, TTS and d/c CM discussed [facility] director [name] who agree to recommendations if TTS made aware of need  to see pt in triage on timely basis [name] notified (TTS supervisor)  [phone] Call from [name] to discuss ED plan of care for pt r/t TTS.  Email sent by [name] fot TTS staff

## 2014-06-03 NOTE — ED Notes (Signed)
Acuity level remains moderate.  Patient with frequent requests and questions of nursing staff.

## 2014-06-03 NOTE — ED Notes (Addendum)
Acuity level moderate.  Awaiting placement in acute care facility.  Frequent nursing requests.

## 2014-06-03 NOTE — BH Assessment (Signed)
Inpt recommended. [facility].  Clista BernhardtNancy Larae Caison, [facility] Triage Specialist 06/03/2014 5:34 AM

## 2014-06-04 NOTE — [facility] Suicide Risk Assessment (Signed)
Suicide Risk Assessment  Discharge Assessment   [facility]   Demographic Factors:  Male and Adolescent or young adult  Total Time spent with patient: 30 minutes  Musculoskeletal: Strength & Muscle Tone: within normal limits Gait & Station: normal Patient leans: N/A  Psychiatric Specialty Exam:     Blood pressure 141/86, pulse 89, temperature 98.1 F (36.7 C), temperature source Oral, resp. rate 18, SpO2 99 %.There is no weight on file to calculate BMI.  General Appearance: Casual and Fairly Groomed  Patent attorneyye Contact::  Good  Speech:  Clear and Coherent and Normal Rate  Volume:  Normal  Mood:  Euthymic  Affect:  Congruent  Thought Process:  Coherent, Goal Directed and Intact  Orientation:  Full (Time, Place, and Person)  Thought Content:  WDL  Suicidal Thoughts:  No  Homicidal Thoughts:  No  Memory:  Immediate;   Good Recent;   Good Remote;   Good  Judgement:  Fair  Insight: Fair  Psychomotor Activity:  Normal  Concentration:  Good  Recall:  NA  Fund of Knowledge:Fair  Language: Good  Akathisia:  NA  Handed:  Right  AIMS (if indicated):     Assets:  Desire for Improvement Housing  ADL's:  Intact  Cognition: WNL  Sleep:         Has this patient used any form of tobacco in the last 30 days? (Cigarettes, Smokeless Tobacco, Cigars, and/or Pipes) No  Mental Status Per Nursing Assessment::   On Admission:   Suicidal ideations  Current Mental Status by Physician: NA  Loss Factors: NA  Historical Factors: Impulsivity  Risk Reduction Factors:   Sense of responsibility to family, Living with another person, especially a relative, Positive social support and Positive therapeutic relationship  Continued Clinical Symptoms:  None  Cognitive Features That Contribute To Risk:  None    Suicide Risk:  Minimal: No identifiable suicidal ideation.  Patients presenting with no risk factors but with morbid ruminations; may be classified as  minimal risk based on the severity of the depressive symptoms  Principal Problem: Schizoaffective disorder, bipolar type Discharge Diagnoses:  Patient Active Problem List   Diagnosis Date Noted  . Schizoaffective disorder, bipolar type [F25.0]     Priority: High  . Drug overdose, intentional [T50.902A] 10/09/2013    Priority: High  . Aggression [F60.89] 08/22/2013    Priority: High  . 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 Asthma, chronic [J45.909] 03/25/2014  . Obesity, morbid [E66.01] 03/25/2014      Plan Of Care/Follow-up recommendations:  Activity:  as tolerated Diet:  heart healthy diet  Is patient on multiple antipsychotic therapies at discharge:  No   Has Patient had three or more failed trials of antipsychotic monotherapy by history:  No  Recommended Plan for Multiple Antipsychotic Therapies: NA    LORD, JAMISON, PMH-NP 06/04/2014, 10:41 AM

## 2014-06-04 NOTE — Consult Note (Signed)
[facility] Face-to-Face Psychiatry Consult   Reason for Consult:  Schizoaffective disorder, Bipolar type, suicidal ideation Referring Physician:  EDP Patient Identification: [name] MRN:  [id number] Principal Diagnosis: Schizoaffective disorder, bipolar type Diagnosis:   Patient Active Problem List   Diagnosis Date Noted  . Schizoaffective disorder, bipolar type [F25.0]     Priority: High  . Drug overdose, intentional [T50.902A] 10/09/2013    Priority: High  . Aggression [F60.89] 08/22/2013    Priority: High  . 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 Asthma, chronic [J45.909] 03/25/2014  . Obesity, morbid [E66.01] 03/25/2014    Total Time spent with patient: 30 minutes  Subjective:   [name] is a 24 y.o. male patient admitted with Suicidal ideation, Bipolar disorder.  HPI:  The patient has stabilized.  He denies suicidal/homicidal ideations, hallucinations, and alcohol/drug abuse.  [name] wants a group home but no one will accept him with his frequent suicidal ideations and trips to the ED.  [name] states he will play "Call of Duty" to prevent coming to the ED, behavior/attention needs.  HPI Elements:   Location:  Schizoaffective disorder, Bipolar type, suicidal ideation, medication non compliant.. Quality:  severe. Severity:  severe. Timing:  Acute. Duration:  Chronic mental illness. Context:  Brought for walking into traffic.Marland Kitchen.  Past Medical History:  Past Medical History  Diagnosis Date  . Asthma   . Bipolar 1 disorder   . ODD (oppositional defiant disorder)   . ADHD (attention deficit hyperactivity disorder)   . Schizophrenia   . Suicidal ideation   . Homicidal ideation   . Explosive personality disorder    History reviewed. No pertinent past surgical history. Family History:  Family History  Problem Relation Age of Onset  . Asthma Mother   . Asthma Sister   . Thyroid disease     Social History:  History  Alcohol Use  . 0.0  oz/week  . 0 Standard drinks or equivalent per week     History  Drug Use No    Comment: Patient denies     History   Social History  . Marital Status: Single    Spouse Name: N/A  . Number of Children: N/A  . Years of Education: N/A   Social History Main Topics  . Smoking status: Never Smoker   . Smokeless tobacco: Not on file  . Alcohol Use: 0.0 oz/week    0 Standard drinks or equivalent per week  . Drug Use: No     Comment: Patient denies   . Sexual Activity: Not on file   Other Topics Concern  . None   Social History Narrative   Additional Social History:    Pain Medications: See PTA Prescriptions: SEE PTA Over the Counter: SEE PTA reports overdosed on about 40 benadryl prior to arrival  History of alcohol / drug use?: Yes Longest period of sobriety (when/how long): "maybe a day or so" nohx of seizures reported  Negative Consequences of Use: Financial, Personal relationships Withdrawal Symptoms:  (none reported ) Name of Substance 1: etoh  1 - Age of First Use: 20 1 - Amount (size/oz): 1-2 40 ounce beers or up to a bottle of wine, half bottle of vodka 1 - Frequency: daily  1 - Duration: years 1 - Last Use / Amount: 06-02-14 small amout of red wine per pt                   Allergies:   Allergies  Allergen Reactions  .  Carbamazepine Anaphylaxis, Other (See Comments) and Cough    Cough up blood  . Geodon [Ziprasidone Hcl] Anaphylaxis and Other (See Comments)    " Causes my throat to close"  . Haldol [Haloperidol Lactate] Anaphylaxis and Other (See Comments)    Patient reports that he stopped taking this because of throat swelling.   [name] [Atomoxetine] Anaphylaxis, Other (See Comments) and Cough    Cough up blood  . Other Nausea Only and Other (See Comments)    Apple Juice, stomach hurts and itchy throat   . Shellfish Allergy Swelling    Throat swelling    Labs:  No results found for this or any previous visit (from the past 48  hour(s)).  Vitals: Blood pressure 141/86, pulse 89, temperature 98.1 F (36.7 C), temperature source Oral, resp. rate 18, SpO2 99 %.  Risk to Self: Suicidal Ideation: Yes-Currently Present Suicidal Intent: Yes-Currently Present Is patient at risk for suicide?: Yes Suicidal Plan?: Yes-Currently Present Specify Current Suicidal Plan: overdose and walk in traffic  Access to Means: Yes Specify Access to Suicidal Means: was found walking in traffic What has been your use of drugs/alcohol within the last 12 months?: Pt has been drinking since age 9 see narrative  How many times?:  (multiple) Other Self Harm Risks: cutting Triggers for Past Attempts: Unpredictable (AVH, conflict with girl friend) Intentional Self Injurious Behavior: Cutting Comment - Self Injurious Behavior: none reported recently significant scars on arms Risk to Others: Homicidal Ideation: No Thoughts of Harm to Others: No Comment - Thoughts of Harm to Others: none reported at this time Current Homicidal Intent: No Current Homicidal Plan: No Describe Current Homicidal Plan: none Access to Homicidal Means: Yes Describe Access to Homicidal Means: knives per report yesterday  Identified Victim: none History of harm to others?: Yes Assessment of Violence: In past 6-12 months Violent Behavior Description: reports was aggressive with pt at [facility] about 3 months ago  Does patient have access to weapons?: Yes (Comment) Criminal Charges Pending?: No Does patient have a court date: No Prior Inpatient Therapy: Prior Inpatient Therapy: Yes Prior Therapy Dates: multiple since young child Prior Therapy Facilty/Provider(s): [facility], [facility], [facility] Reason for Treatment: Psychosis, Aggression. SI Prior Outpatient Therapy: Prior Outpatient Therapy: Yes Prior Therapy Dates: Ongoing Prior Therapy Facilty/Provider(s): [facility] Reason for Treatment: Med Mgmt, Therapy  Current Facility-Administered  Medications  Medication Dose Route Frequency Provider Last Rate Last Dose  . albuterol (PROVENTIL HFA;VENTOLIN HFA) 108 (90 BASE) MCG/ACT inhaler 1 puff  1 puff Inhalation Q6H PRN Raeford Razor, MD      . amantadine (SYMMETREL) capsule 100 mg  100 mg Oral BID Sheamus Hasting   100 mg at 06/04/14 0853  . diphenhydrAMINE (BENADRYL) capsule 25 mg  25 mg Oral QHS PRN Raeford Razor, MD   25 mg at 06/03/14 2116  . fluticasone (FLOVENT HFA) 110 MCG/ACT inhaler 2 puff  2 puff Inhalation Q12H Raeford Razor, MD   2 puff at 06/04/14 [phone]  . gabapentin (NEURONTIN) capsule 400 mg  400 mg Oral QHS Keondria Siever   400 mg at 06/03/14 2115  . ibuprofen (ADVIL,MOTRIN) tablet 600 mg  600 mg Oral Q6H PRN Earney Navy, NP   600 mg at 06/03/14 1054  . MUSCLE RUB CREA   Topical PRN Earney Navy, NP      . paliperidone (INVEGA) 24 hr tablet 6 mg  6 mg Oral Daily Ryen Heitmeyer   6 mg at 06/04/14  1610  . tuberculin injection 5 Units  5 Units Intradermal Once Earney Navy, NP   5 Units at 06/03/14 1707  . Vitamin D (Ergocalciferol) (DRISDOL) capsule 50,000 Units  50,000 Units Oral Q7 days Raeford Razor, MD   50,000 Units at 06/03/14 9604   Current Outpatient Prescriptions  Medication Sig Dispense Refill  . albuterol (PROVENTIL HFA;VENTOLIN HFA) 108 (90 BASE) MCG/ACT inhaler Inhale 1 puff into the lungs every 6 (six) hours as needed for wheezing or shortness of breath. 18 g 3  . DiphenhydrAMINE HCl (BENADRYL PO) Take 1 tablet by mouth as needed (sleep,allergies).     . fluticasone (FLOVENT HFA) 110 MCG/ACT inhaler Inhale 2 puffs into the lungs every 12 (twelve) hours. 1 Inhaler 12  . QUEtiapine (SEROQUEL) 100 MG tablet Take 100 mg by mouth 2 (two) times daily as needed (for mood).    . EPINEPHrine (EPIPEN 2-PAK IJ) Inject 1 each as directed as needed (anaphylaxis).     . Vitamin D, Ergocalciferol, (DRISDOL) 50000 UNITS CAPS capsule Take 1 capsule (50,000 Units total) by mouth every 7 (seven) days.  (Patient not taking: Reported on 05/18/2014) 12 capsule 0    Musculoskeletal: Strength & Muscle Tone: within normal limits Gait & Station: normal Patient leans: N/A  Psychiatric Specialty Exam:     Blood pressure 141/86, pulse 89, temperature 98.1 F (36.7 C), temperature source Oral, resp. rate 18, SpO2 99 %.There is no weight on file to calculate BMI.  General Appearance: Casual and Fairly Groomed  Patent attorney::  Good  Speech:  Clear and Coherent and Normal Rate  Volume:  Normal  Mood:  Euthymic  Affect:  Congruent  Thought Process:  Coherent, Goal Directed and Intact  Orientation:  Full (Time, Place, and Person)  Thought Content:  WDL  Suicidal Thoughts:  No  Homicidal Thoughts:  No  Memory:  Immediate;   Good Recent;   Good Remote;   Good  Judgement:  Fair  Insight: Fair  Psychomotor Activity:  Normal  Concentration:  Good  Recall:  NA  Fund of Knowledge:Fair  Language: Good  Akathisia:  NA  Handed:  Right  AIMS (if indicated):     Assets:  Desire for Improvement Housing  ADL's:  Intact  Cognition: WNL  Sleep:      Medical Decision Making: Review of Psycho-Social Stressors (1), Established Problem, Worsening (2), Review of Medication Regimen & Side Effects (2) and Review of New Medication or Change in Dosage (2)  Treatment Plan Summary:  Plan: discharge to his ACT team Disposition:  Discharge to ACT team  Nanine Means    PMHNP-BC 06/04/2014 10:31 AM Patient seen face-to-face for psychiatric evaluation, chart reviewed and case discussed with the physician extender and developed treatment plan. Reviewed the information documented and agree with the treatment plan. Thedore Mins, MD

## 2014-06-04 NOTE — Progress Notes (Signed)
Per psychiatrist and Np, patient psychiatrically stable for discharge home. CSW met with pt to discuss care plan and pt disposition. Pt plans to work with actt team to go to group home. Pt is motivated to go to a group home and understands that patient would need to keep himself safe and others in order to go to a group home. Pt stated he understood. Pt plans to call his actt crisis team if he has thoughts of hurting himself. Pt states, 'I swear I wont' hurt myself." Patient wishes not to wait for actt team to pick up patient and plans to go home by bus.   Belia Heman, [place] Work  [employer] [phone]

## 2014-06-19 NOTE — H&P (Signed)
PATIENT NAME:  [name], [name]#:  [id number] DATE OF BIRTH:  12-18-1990  DATE OF ADMISSION:  12/11/2013  DATE OF CONSULTATION:  12/12/2013  REASON FOR CONSULTATION:  Psychiatric evaluation.  IDENTIFYING INFORMATION:   The patient is a 24 year old [place] male who has never worked so far and has a girlfriend who lives in [place] and currently he lives with his mother, step-father and 3 brothers. All 6 of them live in a house.  The patient was [facility] Room with a chief complaint of "I don't know why I'm here.  All I know, DSS came to my house and asked me to show them my medicine and I could not show my medicine and [facility] was [facility]  to get my medicine which I did and I misplaced the prescription and I don't have those medicines."   HISTORY OF PRESENT ILLNESS:  According to information obtained from the chart, the patient was [facility] where he was an inpatient for 2-1/2 weeks after he was stabilized on medications and he reports that he had Abilify 10 mg once a day and Cogentin 2 mg twice a day.  He had been on several other medications but he cannot remember the details of the same.  The patient has a history of schizophrenia for many years and [facility] was being [facility]  He was brought here on IVC that states that the patient has hallucinations and suicidal thought, hearing voices that tell him to kill himself and he has a plan to jump off a bridge.  His mother reports that he attempted to jump out of the car when she was driving.   He has had multiple hospitalizations for schizophrenia and has not been taking his medications as recommended.  The patient agrees to some of the statements.  PAST PSYCHIATRIC HISTORY:  History of inpatient hospitalization to psychiatry on many occasions.  The patient cannot give the exact number.   History of suicide attempt by trying to hang himself on a couple of occasions.  He  reports that he has an appointment on 12/14/2013 at 12 noon with [facility] according to his chart.    FAMILY HISTORY OF MENTAL ILLNESS:  Maternal aunt and cousin have schizophrenia and no known history of suicide in the family.  FAMILY HISTORY:   He lives with mother and step-father.  His biological father lives in [place].  The patient has 7 brothers and 4 sisters, close to family.   PERSONAL HISTORY:  Born in [place], moved [place] ago with family because mother is a Education officer, environmentalpastor.  Graduated from high school, does not have a college degree.  Work, he has never worked so far.  Military history; none applicable.  Married; he has never married.  He has a girlfriend that lives in [place], [place] and she has children of her own.  He did not go to the high school prom.    ALCOHOL AND DRUG:  Admits that he drinks alcohol occasionally, not on a regular basis.  Denies street or preswcription drug abuse.   Denies using IV drug use.  No smoking of nicotine cigarettes.    MEDICAL HISTORY:  No known anxiety or depression, no known diabetes mellitus, no major surgeries, status post sprain to the left arm and leg and ankle about 6 weeks ago when he was trying to lift something.  No history of ever being unconscious.  ALLERGIES:  HALDOL AND CLOZAPINE, GEODON and these are more like side effects.    He does not see a physician, goes to the Emergency Room as needed.    PHYSICAL  EXAMINATION:   VITAL SIGNS: Temperature 98.4, pulse 76 per minute, respiratory rate 29 and regular, blood pressure 118/80 mmHg.   HEENT: Head is normocephalic, atraumatic.   EYES:  PERRLA. NECK:  Supple without any organomegaly or thyromegaly. CHEST:  Normal expansion, normal breath sounds heard. HEART: Normal, S1 S2, heard.  no murmurs or rubs. ABDOMEN:  Soft, no organomegaly. Bowel sounds heard.     NEUROLOGIC:   Cranial nerves II through XII grossly intact.  .  MENTAL STATUS:  Exam shows the  patient appears his stated age.  Dressed in street clothes  alert and oriented to place and person but not exact date.   He said today was 12/11/2013, and it was 12/12/2013.  He knew it was [facility] in the state of [place].  He knew the capital of [address], capital of the [place] and knew the current president.  Affect and mood were stable. He denies feeling depressed and was upset that he was brought here after [facility] called the [facility].,Denies being hopeless or helpless.  Does admit to hearing voices of some people that are telling him to kill himself, but these voices do not bother him when he takes his medications, as the medications really help him.  He could name the [address] of [place] and [place] with help.  Memory and recall are fair.  He can spell the word world backward and forward without any problems.  He can count money.  Regarding judgment for fire he said he would crawl down and follow the directions.  Insight and judgment guarded.  Impulse control is poor.  IMPRESSION:   AXIS I:  Schizophrenic, chronic paranoia and exacerbation.    AXIS II:  Mild Mental disabilities. AXIS III:  None major except a sprain of the left ankle and left shoulder 6 weeks ago, resolved. AXIS IV:  Severe.  Long history of mental illness and history of noncompliance of medication which causes him to decompensate  the patient's hospitalization to psychiatry. AXIS  V:   Global Assessment of Function, at time of admission 24.    PLAN:  The patient is   admitted [facility] program for closer observation, evaluationa dn help. Marland Kitchen  He was started back on antipsychotic medication which will be adjusted so that his symptoms are controlled.Marland Kitchen He will also begin milieu therapy and supportive counseling.  He will take  part in individual and group therapy where coping skills in dealing with mental illness will be addressed and complaince will be explained..  At the time  discharge, the patient will have enought coping skills and will have proper follow up appointmentsin the community.      ____________________________ Jannet Mantis. Guss Bunde, MD skc:DT D: 12/12/2013 12:06:04 ET T: 12/12/2013 12:39:04 ET JOB#: [id number]  cc: Monika Salk K. Guss Bunde, MD, <Dictator> Beau Fanny MD ELECTRONICALLY SIGNED 12/12/2013 20:56

## 2014-07-09 ENCOUNTER — Encounter (HOSPITAL_COMMUNITY): Payer: Self-pay | Admitting: Emergency Medicine

## 2014-07-09 ENCOUNTER — Emergency Department (HOSPITAL_COMMUNITY)
Admission: EM | Admit: 2014-07-09 | Discharge: 2014-07-10 | Disposition: A | Discharge status: Home / Self Care | Payer: Medicaid Other | Attending: Emergency Medicine | Admitting: Emergency Medicine

## 2014-07-09 DIAGNOSIS — F419 Anxiety disorder, unspecified: Secondary | ICD-10-CM | POA: Insufficient documentation

## 2014-07-09 DIAGNOSIS — F209 Schizophrenia, unspecified: Secondary | ICD-10-CM | POA: Insufficient documentation

## 2014-07-09 DIAGNOSIS — F4324 Adjustment disorder with disturbance of conduct: Secondary | ICD-10-CM | POA: Diagnosis not present

## 2014-07-09 DIAGNOSIS — R45851 Suicidal ideations: Secondary | ICD-10-CM

## 2014-07-09 DIAGNOSIS — R443 Hallucinations, unspecified: Secondary | ICD-10-CM

## 2014-07-09 DIAGNOSIS — G479 Sleep disorder, unspecified: Secondary | ICD-10-CM | POA: Diagnosis not present

## 2014-07-09 DIAGNOSIS — J45909 Unspecified asthma, uncomplicated: Secondary | ICD-10-CM | POA: Insufficient documentation

## 2014-07-09 DIAGNOSIS — Z79899 Other long term (current) drug therapy: Secondary | ICD-10-CM | POA: Insufficient documentation

## 2014-07-09 DIAGNOSIS — F319 Bipolar disorder, unspecified: Secondary | ICD-10-CM | POA: Diagnosis not present

## 2014-07-09 DIAGNOSIS — R45850 Homicidal ideations: Secondary | ICD-10-CM | POA: Diagnosis not present

## 2014-07-09 DIAGNOSIS — F250 Schizoaffective disorder, bipolar type: Secondary | ICD-10-CM | POA: Diagnosis not present

## 2014-07-09 LAB — COMPREHENSIVE METABOLIC PANEL
ALBUMIN: 4.8 g/dL (ref 3.5–5.0)
ALT: 37 U/L (ref 17–63)
ANION GAP: 9 (ref 5–15)
AST: 27 U/L (ref 15–41)
Alkaline Phosphatase: 83 U/L (ref 38–126)
BUN: 10 mg/dL (ref 6–20)
CO2: 27 mmol/L (ref 22–32)
Calcium: 9.3 mg/dL (ref 8.9–10.3)
Chloride: 104 mmol/L (ref 101–111)
Creatinine, Ser: 0.94 mg/dL (ref 0.61–1.24)
GFR calc non Af Amer: >60 mL/min (ref >60)
Glucose, Bld: 105 mg/dL — ABNORMAL HIGH (ref 65–99)
POTASSIUM: 4.2 mmol/L (ref 3.5–5.1)
Sodium: 140 mmol/L (ref 135–145)
Total Bilirubin: 0.6 mg/dL (ref 0.3–1.2)
Total Protein: 8.4 g/dL — ABNORMAL HIGH (ref 6.5–8.1)

## 2014-07-09 LAB — ETHANOL: Alcohol, Ethyl (B): <5 mg/dL (ref <5)

## 2014-07-09 LAB — CBC
HEMATOCRIT: 47.3 % (ref 39.0–52.0)
HEMOGLOBIN: 16.1 g/dL (ref 13.0–17.0)
MCH: 27.3 pg (ref 26.0–34.0)
MCHC: 34 g/dL (ref 30.0–36.0)
MCV: 80.2 fL (ref 78.0–100.0)
Platelets: 331 10*3/uL (ref 150–400)
RBC: 5.9 MIL/uL — AB (ref 4.22–5.81)
RDW: 13.5 % (ref 11.5–15.5)
WBC: 7.6 10*3/uL (ref 4.0–10.5)

## 2014-07-09 LAB — RAPID URINE DRUG SCREEN, HOSP PERFORMED
Amphetamines: NOT DETECTED
BENZODIAZEPINES: NOT DETECTED
Barbiturates: NOT DETECTED
COCAINE: NOT DETECTED
Opiates: NOT DETECTED
Tetrahydrocannabinol: NOT DETECTED

## 2014-07-09 LAB — SALICYLATE LEVEL

## 2014-07-09 LAB — ACETAMINOPHEN LEVEL: Acetaminophen (Tylenol), Serum: <10 ug/mL — ABNORMAL LOW (ref 10–30)

## 2014-07-09 MED ORDER — ARIPIPRAZOLE ER 400 MG IM SUSR: 400.0000 mg | INTRAMUSCULAR | Status: DC

## 2014-07-09 MED ORDER — ACETAMINOPHEN 325 MG PO TABS: 650.0000 mg | ORAL_TABLET | ORAL | Status: DC | PRN

## 2014-07-09 MED ORDER — LORAZEPAM 1 MG PO TABS
1.0000 mg | ORAL_TABLET | Freq: Three times a day (TID) | ORAL | Status: DC | PRN
  Administered 2014-07-09: 1 mg via ORAL
  Filled 2014-07-09: qty 1

## 2014-07-09 MED ORDER — ONDANSETRON HCL 4 MG PO TABS: 4.0000 mg | ORAL_TABLET | Freq: Three times a day (TID) | ORAL | Status: DC | PRN

## 2014-07-09 MED ORDER — FLUTICASONE PROPIONATE HFA 110 MCG/ACT IN AERO
2.0000 | INHALATION_SPRAY | Freq: Two times a day (BID) | RESPIRATORY_TRACT | Status: DC
  Administered 2014-07-09 – 2014-07-10 (×2): 2 via RESPIRATORY_TRACT
  Filled 2014-07-09 (×2): qty 12

## 2014-07-09 MED ORDER — QUETIAPINE FUMARATE 100 MG PO TABS
100.0000 mg | ORAL_TABLET | Freq: Two times a day (BID) | ORAL | Status: DC
  Administered 2014-07-09 – 2014-07-10 (×2): 100 mg via ORAL
  Filled 2014-07-09 (×2): qty 1

## 2014-07-09 MED ORDER — IBUPROFEN 200 MG PO TABS: 600.0000 mg | ORAL_TABLET | Freq: Three times a day (TID) | ORAL | Status: DC | PRN

## 2014-07-09 NOTE — ED Notes (Signed)
Fluids offered however, pt declines

## 2014-07-09 NOTE — ED Notes (Signed)
Patient reports to ED for suicidal ideation with plan to lay down in front of traffic. Pt states he has AVH of 2 people who say mean things to him and tell him to hurt and kill himself. Pt denies HI at this time.

## 2014-07-09 NOTE — BH Assessment (Signed)
Assessment completed. Consulted Hulan FessIjeoma Nwaeze, NP who agrees that pt meets inpatient criteria. Jaynie Crumbleatyana Kirichenko, PA-C has been informed of the recommendation. [facility] at capacity. TTS will contact other facilities for placement.
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
## 2014-07-09 NOTE — ED Provider Notes (Signed)
CSN: [id number]     Arrival date & time 07/09/14  77 History   First MD Initiated Contact with Patient 07/09/14 1810     Chief Complaint  Patient presents with  . Suicidal      (Consider location/radiation/quality/duration/timing/severity/associated sxs/prior Treatment) HPI Roshad Moster is a 24 y.o. male with history of bipolar disorder, schizophrenia, prior suicidal ideations, presents to emergency department complaining of suicidal ideations. He states he is hearing voices were telling him to hurt himself. He is also thinking that television is talking to him, he is paranoid that the government is out to get him. He states his plan is to jump in front of the car. Patient met with his ACT team who sent him here. Denies any events that would've triggered this. He states he is taking his medications that he is prescribed. He has no homicidal ideations.  Past Medical History  Diagnosis Date  . Asthma   . Bipolar 1 disorder   . ODD (oppositional defiant disorder)   . ADHD (attention deficit hyperactivity disorder)   . Schizophrenia   . Suicidal ideation   . Homicidal ideation   . Explosive personality disorder    History reviewed. No pertinent past surgical history. Family History  Problem Relation Age of Onset  . Asthma Mother   . Asthma Sister   . Thyroid disease     History  Substance Use Topics  . Smoking status: Never Smoker   . Smokeless tobacco: Not on file  . Alcohol Use: 0.0 oz/week    0 Standard drinks or equivalent per week    Review of Systems  Constitutional: Negative for fever and chills.  Respiratory: Negative for cough, chest tightness and shortness of breath.   Cardiovascular: Negative for chest pain, palpitations and leg swelling.  Gastrointestinal: Negative for nausea, vomiting, abdominal pain, diarrhea and abdominal distention.  Genitourinary: Negative for dysuria, urgency, frequency and hematuria.  Musculoskeletal: Negative for myalgias, arthralgias,  neck pain and neck stiffness.  Skin: Negative for rash.  Allergic/Immunologic: Negative for immunocompromised state.  Neurological: Negative for dizziness, weakness, light-headedness, numbness and headaches.  Psychiatric/Behavioral: Positive for suicidal ideas and sleep disturbance. The patient is nervous/anxious.   All other systems reviewed and are negative.     Allergies  Carbamazepine; Geodon; Haldol; Strattera; Other; Shellfish allergy; and Food  Home Medications   Prior to Admission medications   Medication Sig Start Date End Date Taking? Authorizing Provider  albuterol (PROVENTIL HFA;VENTOLIN HFA) 108 (90 BASE) MCG/ACT inhaler Inhale 1 puff into the lungs every 6 (six) hours as needed for wheezing or shortness of breath. 03/25/14  Yes Lorayne Marek, MD  ARIPiprazole 400 MG SUSR Inject 400 mg into the muscle every 30 (thirty) days.   Yes Historical Provider, MD  EPINEPHrine (EPIPEN 2-PAK IJ) Inject 1 each as directed as needed (anaphylaxis).    Yes Historical Provider, MD  fluticasone (FLOVENT HFA) 110 MCG/ACT inhaler Inhale 2 puffs into the lungs every 12 (twelve) hours. 03/25/14  Yes Lorayne Marek, MD  QUEtiapine (SEROQUEL) 100 MG tablet Take 100 mg by mouth 2 (two) times daily.    Yes Historical Provider, MD  DiphenhydrAMINE HCl (BENADRYL PO) Take 1 tablet by mouth as needed (sleep,allergies).     Historical Provider, MD  Vitamin D, Ergocalciferol, (DRISDOL) 50000 UNITS CAPS capsule Take 1 capsule (50,000 Units total) by mouth every 7 (seven) days. Patient not taking: Reported on 05/18/2014 03/30/14   Lorayne Marek, MD   BP 135/69 mmHg  Pulse 92  Temp(Src)  99.5 F (37.5 C) (Oral)  Resp 20  SpO2 99% Physical Exam  Constitutional: He appears well-developed and well-nourished. No distress.  HENT:  Head: Normocephalic and atraumatic.  Eyes: Conjunctivae are normal.  Neck: Neck supple.  Cardiovascular: Normal rate, regular rhythm and normal heart sounds.   Pulmonary/Chest:  Effort normal. No respiratory distress. He has no wheezes. He has no rales.  Abdominal: Soft. Bowel sounds are normal. He exhibits no distension. There is no tenderness. There is no rebound.  Musculoskeletal: He exhibits no edema.  Neurological: He is alert.  Skin: Skin is warm and dry.  Nursing note and vitals reviewed.   ED Course  Procedures (including critical care time) Labs Review Labs Reviewed  ACETAMINOPHEN LEVEL - Abnormal; Notable for the following:    Acetaminophen (Tylenol), Serum <10 (*)    All other components within normal limits  CBC - Abnormal; Notable for the following:    RBC 5.90 (*)    All other components within normal limits  COMPREHENSIVE METABOLIC PANEL - Abnormal; Notable for the following:    Glucose, Bld 105 (*)    Total Protein 8.4 (*)    All other components within normal limits  ETHANOL  SALICYLATE LEVEL  URINE RAPID DRUG SCREEN (HOSP PERFORMED)    Imaging Review No results found.   EKG Interpretation None      MDM   Final diagnoses:  Suicidal ideations  Hallucinations    A shunt with worsening suicidal thoughts, plan to jump from the car, hearing voices, says that TV speaking to him, not sleeping. Spoke with patient's act team, advised to have fully evaluated by psychiatrist. TTS consult placed  9:50 PM Patient assessed by TTS, they recommended inpatient treatment. Pt is medically cleared. Patient informed and he is agreeable. He is here voluntarily. He is calm and cooperative this time. Holding orders placed.  Filed Vitals:   07/09/14 1646  BP: 135/69  Pulse: 92  Temp: 99.5 F (37.5 C)  TempSrc: Oral  Resp: 20  SpO2: 99%     Jeannett Senior, PA-C 07/09/14 [place], MD 07/10/14 1531

## 2014-07-10 DIAGNOSIS — R443 Hallucinations, unspecified: Secondary | ICD-10-CM | POA: Insufficient documentation

## 2014-07-10 DIAGNOSIS — R45851 Suicidal ideations: Secondary | ICD-10-CM

## 2014-07-10 DIAGNOSIS — R45850 Homicidal ideations: Secondary | ICD-10-CM | POA: Diagnosis not present

## 2014-07-10 DIAGNOSIS — F250 Schizoaffective disorder, bipolar type: Secondary | ICD-10-CM | POA: Diagnosis not present

## 2014-07-10 DIAGNOSIS — F4324 Adjustment disorder with disturbance of conduct: Secondary | ICD-10-CM

## 2014-07-10 NOTE — ED Notes (Signed)
Pt reports chest pain is still there.  MD Plunkett aware.  Pt denies any anxiety or other symptoms.  Pt did report he feels like it's coming from the medication.  He reports he only takes 50 of seroquel in am, but agreed to take the 100mg  pill that was ordered this am.

## 2014-07-10 NOTE — Consult Note (Signed)
[facility] Face-to-Face Psychiatry Consult   Reason for Consult:  Suicidal ideation, Auditory hallucination Referring Physician:  EDP Patient Identification: [name] MRN:  [id number] Principal Diagnosis: Adjustment disorder with disturbance of conduct Diagnosis:   Patient Active Problem List   Diagnosis Date Noted  . Adjustment disorder with disturbance of conduct [F43.24]     Priority: High  . Schizoaffective disorder, bipolar type [F25.0]     Priority: High  . Asthma, chronic [J45.909] 03/25/2014  . Obesity, morbid [E66.01] 03/25/2014  . Drug overdose, intentional [T50.902A] 10/09/2013  . Aggression [F60.89] 08/22/2013    Total Time spent with patient: 45 minutes  Subjective:   [name] is a 24 y.o. male patient admitted with Suicidal and homicidal ideation. Marland Kitchen  HPI:  AA male, 24 years old well known to the service was evaluated for suicidal ideation, auditory and visual hallucination.  Patient stated that he received his Abilify injection last week but started feeling feeling suicidal and hearing voices two days ago.  Patient reports that he is compliant with his medications and that he is receiving therapy and care from his ACT team. Patient stated that he had plans to jump into traffic before coming to the hospital.  Patient, today denies SI/HI/AVH.  Patient will ne taken home by his ACT team staff and will be evaluating his medications.  Patient is discharged home.  HPI Elements:   Location:  Adjustment disorder with disturbance of conduct, suicidal ideation. Quality:  Moderate. Severity:  moderate. Timing:  acute. Duration:  Chronic mental illness. Context:  Seeking treatment for feeling suicide and auditory hallucination..  Past Medical History:  Past Medical History  Diagnosis Date  . Asthma   . Bipolar 1 disorder   . ODD (oppositional defiant disorder)   . ADHD (attention deficit hyperactivity disorder)   . Schizophrenia   . Suicidal ideation   . Homicidal  ideation   . Explosive personality disorder    History reviewed. No pertinent past surgical history. Family History:  Family History  Problem Relation Age of Onset  . Asthma Mother   . Asthma Sister   . Thyroid disease     Social History:  History  Alcohol Use  . 0.0 oz/week  . 0 Standard drinks or equivalent per week     History  Drug Use No    Comment: Patient denies     History   Social History  . Marital Status: Single    Spouse Name: N/A  . Number of Children: N/A  . Years of Education: N/A   Social History Main Topics  . Smoking status: Never Smoker   . Smokeless tobacco: Not on file  . Alcohol Use: 0.0 oz/week    0 Standard drinks or equivalent per week  . Drug Use: No     Comment: Patient denies   . Sexual Activity: Not on file   Other Topics Concern  . None   Social History Narrative   Additional Social History:    Pain Medications: Pt denies abuse  Prescriptions: Pt denies abuse  Over the Counter: Pt deniese abuse. Pt reported that he has overdosed on Benadryl in the past.  Longest period of sobriety (when/how long): unknown  Negative Consequences of Use: Financial, Personal relationships Name of Substance 1: Alcohol  1 - Amount (size/oz): 1-2 40 ounce beers or up to a bottle of wine, half bottle of vodka 1 - Frequency: daily  1 - Duration: years 1 - Last Use / Amount: "1 week ago"  Allergies:   Allergies  Allergen Reactions  . Carbamazepine Anaphylaxis, Other (See Comments) and Cough    Cough up blood  . Geodon [Ziprasidone Hcl] Anaphylaxis and Other (See Comments)    " Causes my throat to close"  . Haldol [Haloperidol Lactate] Anaphylaxis and Other (See Comments)    Patient reports that he stopped taking this because of throat swelling.   [name] [Atomoxetine] Anaphylaxis, Other (See Comments) and Cough    Cough up blood  . Other Nausea Only and Other (See Comments)    Apple Juice, stomach hurts and itchy  throat   . Shellfish Allergy Swelling    Throat swelling  . Food Nausea And Vomiting    Lamb/mutton.     Labs:  Results for orders placed or performed during the hospital encounter of 07/09/14 (from the past 48 hour(s))  CBC     Status: Abnormal   Collection Time: 07/09/14  6:09 PM  Result Value Ref Range   WBC 7.6 4.0 - 10.5 K/uL   RBC 5.90 (H) 4.22 - 5.81 MIL/uL   Hemoglobin 16.1 13.0 - 17.0 g/dL   HCT 74.9 66.4 - 66.0 %   MCV 80.2 78.0 - 100.0 fL   MCH 27.3 26.0 - 34.0 pg   MCHC 34.0 30.0 - 36.0 g/dL   RDW 56.3 72.9 - 42.6 %   Platelets 331 150 - 400 K/uL  Comprehensive metabolic panel     Status: Abnormal   Collection Time: 07/09/14  6:09 PM  Result Value Ref Range   Sodium 140 135 - 145 mmol/L   Potassium 4.2 3.5 - 5.1 mmol/L   Chloride 104 101 - 111 mmol/L   CO2 27 22 - 32 mmol/L   Glucose, Bld 105 (H) 65 - 99 mg/dL   BUN 10 6 - 20 mg/dL   Creatinine, Ser 2.70 0.61 - 1.24 mg/dL   Calcium 9.3 8.9 - 04.8 mg/dL   Total Protein 8.4 (H) 6.5 - 8.1 g/dL   Albumin 4.8 3.5 - 5.0 g/dL   AST 27 15 - 41 U/L   ALT 37 17 - 63 U/L   Alkaline Phosphatase 83 38 - 126 U/L   Total Bilirubin 0.6 0.3 - 1.2 mg/dL   GFR calc non Af Amer >60 >60 mL/min   GFR calc Af Amer >60 >60 mL/min    Comment: (NOTE) The eGFR has been calculated using the CKD EPI equation. This calculation has not been validated in all clinical situations. eGFR's persistently <60 mL/min signify possible Chronic Kidney Disease.    Anion gap 9 5 - 15  Acetaminophen level     Status: Abnormal   Collection Time: 07/09/14  6:10 PM  Result Value Ref Range   Acetaminophen (Tylenol), Serum <10 (L) 10 - 30 ug/mL    Comment:        THERAPEUTIC CONCENTRATIONS VARY SIGNIFICANTLY. A RANGE OF 10-30 ug/mL MAY BE AN EFFECTIVE CONCENTRATION FOR MANY PATIENTS. HOWEVER, SOME ARE BEST TREATED AT CONCENTRATIONS OUTSIDE THIS RANGE. ACETAMINOPHEN CONCENTRATIONS >150 ug/mL AT 4 HOURS AFTER INGESTION AND >50 ug/mL AT 12 HOURS  AFTER INGESTION ARE OFTEN ASSOCIATED WITH TOXIC REACTIONS.   Ethanol (ETOH)     Status: None   Collection Time: 07/09/14  6:10 PM  Result Value Ref Range   Alcohol, Ethyl (B) <5 <5 mg/dL    Comment:        LOWEST DETECTABLE LIMIT FOR SERUM ALCOHOL IS 11 mg/dL FOR MEDICAL PURPOSES ONLY   Salicylate level  Status: None   Collection Time: 07/09/14  6:10 PM  Result Value Ref Range   Salicylate Lvl <1.8 2.8 - 30.0 mg/dL  Urine Drug Screen     Status: None   Collection Time: 07/09/14  6:45 PM  Result Value Ref Range   Opiates NONE DETECTED NONE DETECTED   Cocaine NONE DETECTED NONE DETECTED   Benzodiazepines NONE DETECTED NONE DETECTED   Amphetamines NONE DETECTED NONE DETECTED   Tetrahydrocannabinol NONE DETECTED NONE DETECTED   Barbiturates NONE DETECTED NONE DETECTED    Comment:        DRUG SCREEN FOR MEDICAL PURPOSES ONLY.  IF CONFIRMATION IS NEEDED FOR ANY PURPOSE, NOTIFY LAB WITHIN 5 DAYS.        LOWEST DETECTABLE LIMITS FOR URINE DRUG SCREEN Drug Class       Cutoff (ng/mL) Amphetamine      1000 Barbiturate      200 Benzodiazepine   563 Tricyclics       149 Opiates          300 Cocaine          300 THC              50     Vitals: Blood pressure 119/76, pulse 86, temperature 97.8 F (36.6 C), temperature source Oral, resp. rate 18, SpO2 99 %.  Risk to Self: Suicidal Ideation: Yes-Currently Present Suicidal Intent: Yes-Currently Present Is patient at risk for suicide?: Yes Suicidal Plan?: Yes-Currently Present Specify Current Suicidal Plan: "Jump in front of a car".  Access to Means: Yes Specify Access to Suicidal Means: Pt has access to traffic".  What has been your use of drugs/alcohol within the last 12 months?: Pt denies drug use but reported alcohol use.  How many times?:  (Multiple "I don't want to tell you the wrong number". ) Other Self Harm Risks: No other self harm risk identified at this time.  Triggers for Past Attempts: Unpredictable,  Hallucinations (Conflict with girl friend) Intentional Self Injurious Behavior: None Comment - Self Injurious Behavior: NA Risk to Others: Homicidal Ideation: No Thoughts of Harm to Others: No Comment - Thoughts of Harm to Others: NA Current Homicidal Intent: No Current Homicidal Plan: No Describe Current Homicidal Plan: NA Access to Homicidal Means: No Describe Access to Homicidal Means: NA Identified Victim: NA History of harm to others?: No Assessment of Violence: On admission Violent Behavior Description: No violent behaviors observed. Pt is calm and cooperative.  Does patient have access to weapons?: Yes (Comment) ("Knives" ) Criminal Charges Pending?: No Does patient have a court date: No Prior Inpatient Therapy: Prior Inpatient Therapy: Yes Prior Therapy Dates: multiple since young child Prior Therapy Facilty/Provider(s): [place], [place], [place] Reason for Treatment: Psychosis, Aggression. SI Prior Outpatient Therapy: Prior Outpatient Therapy: Yes Prior Therapy Dates: Ongoing Prior Therapy Facilty/Provider(s): [facility] past Reason for Treatment: Med Mgmt, Therapy Does patient have an ACCT team?: Yes ([facility]) Does patient have Intensive In-House Services?  : No Does patient have [facility] services? : No Does patient have [facility] services?: Unknown  Current Facility-Administered Medications  Medication Dose Route Frequency Provider Last Rate Last Dose  . acetaminophen (TYLENOL) tablet 650 mg  650 mg Oral Q4H PRN Tatyana Kirichenko, PA-C      . [START ON 07/28/2014] ARIPiprazole SUSR 400 mg  400 mg Intramuscular Q30 days Tatyana Kirichenko, PA-C      . fluticasone (FLOVENT HFA) 110 MCG/ACT inhaler 2 puff  2 puff Inhalation Q12H Tatyana Kirichenko,  PA-C   2 puff at 07/10/14 0735  . ibuprofen (ADVIL,MOTRIN) tablet 600 mg  600 mg Oral Q8H PRN Tatyana Kirichenko, PA-C      . LORazepam (ATIVAN) tablet 1 mg  1 mg Oral Q8H PRN Tatyana Kirichenko, PA-C   1 mg at  07/09/14 2215  . ondansetron (ZOFRAN) tablet 4 mg  4 mg Oral Q8H PRN Tatyana Kirichenko, PA-C      . QUEtiapine (SEROQUEL) tablet 100 mg  100 mg Oral BID Tatyana Kirichenko, PA-C   100 mg at 07/10/14 1018   Current Outpatient Prescriptions  Medication Sig Dispense Refill  . albuterol (PROVENTIL HFA;VENTOLIN HFA) 108 (90 BASE) MCG/ACT inhaler Inhale 1 puff into the lungs every 6 (six) hours as needed for wheezing or shortness of breath. 18 g 3  . ARIPiprazole 400 MG SUSR Inject 400 mg into the muscle every 30 (thirty) days.    Marland Kitchen EPINEPHrine (EPIPEN 2-PAK IJ) Inject 1 each as directed as needed (anaphylaxis).     . fluticasone (FLOVENT HFA) 110 MCG/ACT inhaler Inhale 2 puffs into the lungs every 12 (twelve) hours. 1 Inhaler 12  . QUEtiapine (SEROQUEL) 100 MG tablet Take 100 mg by mouth 2 (two) times daily.     . DiphenhydrAMINE HCl (BENADRYL PO) Take 1 tablet by mouth as needed (sleep,allergies).     . Vitamin D, Ergocalciferol, (DRISDOL) 50000 UNITS CAPS capsule Take 1 capsule (50,000 Units total) by mouth every 7 (seven) days. (Patient not taking: Reported on 05/18/2014) 12 capsule 0    Musculoskeletal: Strength & Muscle Tone: within normal limits Gait & Station: normal Patient leans: N/A  Psychiatric Specialty Exam: Physical Exam  Review of Systems  Constitutional: Negative.   HENT: Negative.   Eyes: Negative.   Respiratory: Negative.   Cardiovascular: Negative.   Gastrointestinal: Negative.   Genitourinary: Negative.   Musculoskeletal: Negative.   Skin: Negative.   Neurological: Negative.   Endo/Heme/Allergies: Negative.   Psychiatric/Behavioral: Negative for depression (reported depression for two days due to poor sleep) and suicidal ideas.    Blood pressure 119/76, pulse 86, temperature 97.8 F (36.6 C), temperature source Oral, resp. rate 18, SpO2 99 %.There is no weight on file to calculate BMI.  General Appearance: Casual and Fairly Groomed  Engineer, water::  Good  Speech:   Clear and Coherent and Normal Rate  Volume:  Normal  Mood:  Euthymic  Affect:  Congruent  Thought Process:  Coherent, Goal Directed and Intact  Orientation:  Full (Time, Place, and Person)  Thought Content:  WDL  Suicidal Thoughts:  No  Homicidal Thoughts:  No  Memory:  Immediate;   Good Recent;   Good Remote;   Good  Judgement:  Fair  Insight:  Shallow  Psychomotor Activity:  Normal  Concentration:  Fair  Recall:  NA  Fund of Knowledge:Fair  Language: Good  Akathisia:  NA  Handed:  Right  AIMS (if indicated):     Assets:  Desire for Improvement  ADL's:  Intact  Cognition: Impaired,  Mild  Sleep:      Medical Decision Making: Established Problem, Stable/Improving (1)  Treatment Plan Summary: Discharge home  Plan:  Discharge home, ACT TEAM  Disposition: Discharge back home with ACT team  Delfin Gant  PMHNP-BC 07/10/2014 4:08 PM

## 2014-07-10 NOTE — Progress Notes (Signed)
2:17pm. CSW spoke with Kara MeadEmma, pt's ACT team worker, to inform of d/c. Kara Meadmma states she will pick him up as soon as she can this afternoon. She was currently responding to a call in [place]--she will come after that.  York SpanielAlexandra Keenon Leitzel [facility] Allegiance Specialty HospitalCSWA Clinical Social Worker [facility] phone: [phone]

## 2014-07-10 NOTE — ED Notes (Signed)
Pt c/o chest pain to L side.  In and out of sleep.  Denies any other symptoms.  Lungs sounds clear bilaterally.

## 2014-07-10 NOTE — [facility] Suicide Risk Assessment (Cosign Needed)
Suicide Risk Assessment  Discharge Assessment   [facility]   Demographic Factors:  Male, Adolescent or young adult, Low socioeconomic status, Living alone and Unemployed  Total Time spent with patient: 20 minutes  Musculoskeletal: Strength & Muscle Tone: within normal limits Gait & Station: normal Patient leans: N/A  Psychiatric Specialty Exam:     Blood pressure 118/68, pulse 72, temperature 97.8 F (36.6 C), temperature source Oral, resp. rate 18, SpO2 97 %.There is no weight on file to calculate BMI.  General Appearance: Casual and Fairly Groomed  Eye Contact::  Good  Speech:  Clear and Coherent and Normal Rate409  Volume:  Normal  Mood:  Euthymic  Affect:  Congruent  Thought Process:  Coherent, Goal Directed and Intact  Orientation:  Full (Time, Place, and Person)  Thought Content:  WDL  Suicidal Thoughts:  No  Homicidal Thoughts:  No  Memory:  Immediate;   Good Recent;   Good Remote;   Good  Judgement:  Fair  Insight:  Fair  Psychomotor Activity:  Normal  Concentration:  Good  Recall:  NA  Fund of Knowledge:Fair  Language: Good  Akathisia:  NA  Handed:  Right  AIMS (if indicated):     Assets:  Desire for Improvement  Sleep:     Cognition: Impaired,  Mild  ADL's:  Intact      Has this patient used any form of tobacco in the last 30 days? (Cigarettes, Smokeless Tobacco, Cigars, and/or Pipes) N/A  Mental Status Per Nursing Assessment::   On Admission:     Current Mental Status by Physician: NA  Loss Factors: NA  Historical Factors: Prior suicide attempts  Risk Reduction Factors:   Positive social support and ACT team care  Continued Clinical Symptoms:  Bipolar Disorder:   Depressive phase Depression:   Insomnia Schizophrenia:   Paranoid or undifferentiated type  Cognitive Features That Contribute To Risk:  Polarized thinking    Suicide Risk:  Minimal: No identifiable suicidal ideation.  Patients presenting with  no risk factors but with morbid ruminations; may be classified as minimal risk based on the severity of the depressive symptoms  Principal Problem: <principal problem not specified> Discharge Diagnoses:  Patient Active Problem List   Diagnosis Date Noted  . Schizoaffective disorder, bipolar type [F25.0]     Priority: High  . 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 Asthma, chronic [J45.909] 03/25/2014  . Obesity, morbid [E66.01] 03/25/2014  . Drug overdose, intentional [T50.902A] 10/09/2013  . Aggression [F60.89] 08/22/2013      Plan Of Care/Follow-up recommendations:  Activity:  as tolerated Diet:  regular  Is patient on multiple antipsychotic therapies at discharge:  No   Has Patient had three or more failed trials of antipsychotic monotherapy by history:  No  Recommended Plan for Multiple Antipsychotic Therapies: NA    Candice Lunney C   PMHNP-BC 07/10/2014, 4:04 PM

## 2014-07-13 ENCOUNTER — Emergency Department (HOSPITAL_COMMUNITY)
Admission: EM | Admit: 2014-07-13 | Discharge: 2014-07-14 | Disposition: A | Discharge status: Home / Self Care | Payer: Federal, State, Local not specified - Other | Attending: Emergency Medicine | Admitting: Emergency Medicine

## 2014-07-13 DIAGNOSIS — Y9389 Activity, other specified: Secondary | ICD-10-CM | POA: Diagnosis not present

## 2014-07-13 DIAGNOSIS — Y9289 Other specified places as the place of occurrence of the external cause: Secondary | ICD-10-CM | POA: Diagnosis not present

## 2014-07-13 DIAGNOSIS — Y998 Other external cause status: Secondary | ICD-10-CM | POA: Insufficient documentation

## 2014-07-13 DIAGNOSIS — S6991XA Unspecified injury of right wrist, hand and finger(s), initial encounter: Secondary | ICD-10-CM | POA: Insufficient documentation

## 2014-07-13 DIAGNOSIS — R441 Visual hallucinations: Secondary | ICD-10-CM

## 2014-07-13 DIAGNOSIS — R440 Auditory hallucinations: Secondary | ICD-10-CM

## 2014-07-13 DIAGNOSIS — W1839XA Other fall on same level, initial encounter: Secondary | ICD-10-CM | POA: Diagnosis not present

## 2014-07-13 DIAGNOSIS — F250 Schizoaffective disorder, bipolar type: Secondary | ICD-10-CM | POA: Diagnosis present

## 2014-07-13 DIAGNOSIS — J45909 Unspecified asthma, uncomplicated: Secondary | ICD-10-CM | POA: Diagnosis not present

## 2014-07-13 DIAGNOSIS — F913 Oppositional defiant disorder: Secondary | ICD-10-CM | POA: Insufficient documentation

## 2014-07-13 DIAGNOSIS — Z79899 Other long term (current) drug therapy: Secondary | ICD-10-CM | POA: Diagnosis not present

## 2014-07-13 DIAGNOSIS — F319 Bipolar disorder, unspecified: Secondary | ICD-10-CM | POA: Diagnosis not present

## 2014-07-13 DIAGNOSIS — R45851 Suicidal ideations: Secondary | ICD-10-CM | POA: Diagnosis present

## 2014-07-13 DIAGNOSIS — W19XXXA Unspecified fall, initial encounter: Secondary | ICD-10-CM

## 2014-07-14 ENCOUNTER — Encounter (HOSPITAL_COMMUNITY): Payer: Self-pay | Admitting: Emergency Medicine

## 2014-07-14 ENCOUNTER — Emergency Department (HOSPITAL_COMMUNITY): Payer: Federal, State, Local not specified - Other

## 2014-07-14 DIAGNOSIS — F250 Schizoaffective disorder, bipolar type: Secondary | ICD-10-CM

## 2014-07-14 DIAGNOSIS — R45851 Suicidal ideations: Secondary | ICD-10-CM

## 2014-07-14 LAB — ETHANOL

## 2014-07-14 LAB — RAPID URINE DRUG SCREEN, HOSP PERFORMED
Amphetamines: NOT DETECTED
BARBITURATES: NOT DETECTED
Benzodiazepines: NOT DETECTED
COCAINE: NOT DETECTED
Opiates: NOT DETECTED
TETRAHYDROCANNABINOL: NOT DETECTED

## 2014-07-14 LAB — CBC
HCT: 44.7 % (ref 39.0–52.0)
Hemoglobin: 15.2 g/dL (ref 13.0–17.0)
MCH: 27.3 pg (ref 26.0–34.0)
MCHC: 34 g/dL (ref 30.0–36.0)
MCV: 80.4 fL (ref 78.0–100.0)
Platelets: 317 10*3/uL (ref 150–400)
RBC: 5.56 MIL/uL (ref 4.22–5.81)
RDW: 13.6 % (ref 11.5–15.5)
WBC: 6.5 10*3/uL (ref 4.0–10.5)

## 2014-07-14 LAB — COMPREHENSIVE METABOLIC PANEL
ALT: 51 U/L (ref 17–63)
AST: 64 U/L — AB (ref 15–41)
Albumin: 4.6 g/dL (ref 3.5–5.0)
Alkaline Phosphatase: 79 U/L (ref 38–126)
Anion gap: 10 (ref 5–15)
BUN: 7 mg/dL (ref 6–20)
CHLORIDE: 104 mmol/L (ref 101–111)
CO2: 27 mmol/L (ref 22–32)
Calcium: 9.3 mg/dL (ref 8.9–10.3)
Creatinine, Ser: 1 mg/dL (ref 0.61–1.24)
GFR calc Af Amer: >60 mL/min (ref >60)
GFR calc non Af Amer: >60 mL/min (ref >60)
Glucose, Bld: 90 mg/dL (ref 65–99)
Potassium: 3.6 mmol/L (ref 3.5–5.1)
Sodium: 141 mmol/L (ref 135–145)
Total Bilirubin: 0.5 mg/dL (ref 0.3–1.2)
Total Protein: 8.1 g/dL (ref 6.5–8.1)

## 2014-07-14 LAB — ACETAMINOPHEN LEVEL

## 2014-07-14 LAB — SALICYLATE LEVEL: Salicylate Lvl: <4 mg/dL (ref 2.8–30.0)

## 2014-07-14 MED ORDER — ZOLPIDEM TARTRATE 5 MG PO TABS: 5.0000 mg | ORAL_TABLET | Freq: Every evening | ORAL | Status: DC | PRN

## 2014-07-14 MED ORDER — ONDANSETRON HCL 4 MG PO TABS: 4.0000 mg | ORAL_TABLET | Freq: Three times a day (TID) | ORAL | Status: DC | PRN

## 2014-07-14 MED ORDER — ACETAMINOPHEN 325 MG PO TABS
650.0000 mg | ORAL_TABLET | ORAL | Status: DC | PRN
  Administered 2014-07-14: 650 mg via ORAL
  Filled 2014-07-14: qty 2

## 2014-07-14 MED ORDER — ALBUTEROL SULFATE HFA 108 (90 BASE) MCG/ACT IN AERS: 1.0000 | INHALATION_SPRAY | Freq: Four times a day (QID) | RESPIRATORY_TRACT | Status: DC | PRN

## 2014-07-14 MED ORDER — LORAZEPAM 1 MG PO TABS: 1.0000 mg | ORAL_TABLET | Freq: Three times a day (TID) | ORAL | Status: DC | PRN

## 2014-07-14 MED ORDER — QUETIAPINE FUMARATE 100 MG PO TABS
100.0000 mg | ORAL_TABLET | Freq: Two times a day (BID) | ORAL | Status: DC
  Administered 2014-07-14: 100 mg via ORAL
  Filled 2014-07-14: qty 1

## 2014-07-14 MED ORDER — ARIPIPRAZOLE ER 400 MG IM SUSR: 400.0000 mg | INTRAMUSCULAR | Status: DC

## 2014-07-14 MED ORDER — FLUTICASONE PROPIONATE HFA 110 MCG/ACT IN AERO
2.0000 | INHALATION_SPRAY | Freq: Two times a day (BID) | RESPIRATORY_TRACT | Status: DC
  Filled 2014-07-14: qty 12

## 2014-07-14 MED ORDER — IBUPROFEN 200 MG PO TABS
600.0000 mg | ORAL_TABLET | Freq: Three times a day (TID) | ORAL | Status: DC | PRN
  Administered 2014-07-14: 600 mg via ORAL
  Filled 2014-07-14: qty 3

## 2014-07-14 NOTE — ED Notes (Signed)
Pt called his ACT team tonight stating that he was suicidal with a plan to walk into traffic. ACT team in turn called [facility] to bring pt here. Pt is voluntary at present. Alert and oriented.
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
## 2014-07-14 NOTE — [facility] Suicide Risk Assessment (Signed)
Suicide Risk Assessment  Discharge Assessment   [facility]   Demographic Factors:  Male and Adolescent or young adult  Total Time spent with patient: 45 minutes   Musculoskeletal: Strength & Muscle Tone: within normal limits Gait & Station: normal Patient leans: N/A  Psychiatric Specialty Exam: Physical Exam  Review of Systems  Constitutional: Negative.   HENT: Negative.   Eyes: Negative.   Respiratory: Negative.   Cardiovascular: Negative.   Gastrointestinal: Negative.   Genitourinary: Negative.   Musculoskeletal: Negative.   Skin: Negative.   Neurological: Negative.   Endo/Heme/Allergies: Negative.   Psychiatric/Behavioral:       No psychiatric issues on assessment     Blood pressure 128/61, pulse 67, temperature 98.6 F (37 C), temperature source Oral, resp. rate 18, SpO2 99 %.There is no weight on file to calculate BMI.  General Appearance: Casual  Eye Contact::  Good  Speech:  Normal Rate  Volume:  Normal  Mood:  Euthymic  Affect:  Congruent  Thought Process:  Coherent  Orientation:  Full (Time, Place, and Person)  Thought Content:  WDL  Suicidal Thoughts:  No  Homicidal Thoughts:  No  Memory:  Immediate;   Good Recent;   Good Remote;   Good  Judgement:  Good  Insight:  Good  Psychomotor Activity:  Normal  Concentration:  Good  Recall:  Good  Fund of Knowledge:Good  Language: Good  Akathisia:  No  Handed:  Right  AIMS (if indicated):     Assets:  Health and safety inspectorinancial Resources/Insurance Housing Leisure Time Physical Health Resilience Social Support  ADL's:  Intact  Cognition: WNL  Sleep:         Has this patient used any form of tobacco in the last 30 days? (Cigarettes, Smokeless Tobacco, Cigars, and/or Pipes) No  Mental Status Per Nursing Assessment::   On Admission:   Suicidal ideations  Current Mental Status by Physician: NA  Loss Factors: NA  Historical Factors: NA  Risk Reduction Factors:   Sense of  responsibility to family, Living with another person, especially a relative, Positive social support, Positive therapeutic relationship and Positive coping skills or problem solving skills  Continued Clinical Symptoms:  None  Cognitive Features That Contribute To Risk:  None    Suicide Risk:  Minimal: No identifiable suicidal ideation.  Patients presenting with no risk factors but with morbid ruminations; may be classified as minimal risk based on the severity of the depressive symptoms  Principal Problem: Schizoaffective disorder, bipolar type Discharge Diagnoses:  Patient Active Problem List   Diagnosis Date Noted  . Suicidal ideations [R45.851]     Priority: High  . Schizoaffective disorder, bipolar type [F25.0]     Priority: High  . Drug overdose, intentional [T50.902A] 10/09/2013    Priority: High  . Aggression [F60.89] 08/22/2013    Priority: High  . Suicidal ideation [R45.851]   . Hallucinations [R44.3]   . 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 Asthma, chronic [J45.909] 03/25/2014  . Obesity, morbid [E66.01] 03/25/2014      Plan Of Care/Follow-up recommendations:  Activity:  as tolerated Diet:  heart healthy diet  Is patient on multiple antipsychotic therapies at discharge:  No   Has Patient had three or more failed trials of antipsychotic monotherapy by history:  No  Recommended Plan for Multiple Antipsychotic Therapies: NA    LORD, JAMISON, PMH-NP 07/14/2014, 1:00 PM
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
## 2014-07-14 NOTE — BH Assessment (Addendum)
Pt well known to ED and TTS staff. Called his ACT team tonight complaining of SI with plan to walk in traffic. ACT called police and he was brought to ED. Pt was last assessed on 07-09-14.  Requested cart be placed with pt for assessment.   Assessment to commence shortly.   First attempt did not connect or ring. Informed Berna SpareMarcus, [facility] and he will check on status of cart and ring this Clinical research associatewriter at desktop 1.  Second attempt, would ring but no answer.   Clista BernhardtNancy Unique Sillas, [facility] Triage Specialist 07/14/2014 12:48 AM

## 2014-07-14 NOTE — BH Assessment (Addendum)
Seeking inpt placement. [name]    Natasha Burda, [facility] Triage Specialist 07/14/2014 2:03 AM

## 2014-07-14 NOTE — ED Notes (Signed)
Bed: [id number] Expected date:  Expected time:  Means of arrival:  Comments: Hold for [place]
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
## 2014-07-16 ENCOUNTER — Encounter (HOSPITAL_COMMUNITY): Payer: Self-pay

## 2014-07-16 ENCOUNTER — Emergency Department (HOSPITAL_COMMUNITY)
Admission: EM | Admit: 2014-07-16 | Discharge: 2014-07-17 | Disposition: A | Discharge status: Home / Self Care | Payer: Medicaid Other | Attending: Emergency Medicine | Admitting: Emergency Medicine

## 2014-07-16 DIAGNOSIS — Y998 Other external cause status: Secondary | ICD-10-CM | POA: Diagnosis not present

## 2014-07-16 DIAGNOSIS — F250 Schizoaffective disorder, bipolar type: Secondary | ICD-10-CM | POA: Diagnosis not present

## 2014-07-16 DIAGNOSIS — Y9389 Activity, other specified: Secondary | ICD-10-CM | POA: Diagnosis not present

## 2014-07-16 DIAGNOSIS — Z23 Encounter for immunization: Secondary | ICD-10-CM | POA: Insufficient documentation

## 2014-07-16 DIAGNOSIS — X788XXA Intentional self-harm by other sharp object, initial encounter: Secondary | ICD-10-CM | POA: Diagnosis not present

## 2014-07-16 DIAGNOSIS — S41111A Laceration without foreign body of right upper arm, initial encounter: Secondary | ICD-10-CM | POA: Insufficient documentation

## 2014-07-16 DIAGNOSIS — J45909 Unspecified asthma, uncomplicated: Secondary | ICD-10-CM | POA: Diagnosis not present

## 2014-07-16 DIAGNOSIS — F319 Bipolar disorder, unspecified: Secondary | ICD-10-CM | POA: Diagnosis not present

## 2014-07-16 DIAGNOSIS — Y9289 Other specified places as the place of occurrence of the external cause: Secondary | ICD-10-CM | POA: Diagnosis not present

## 2014-07-16 DIAGNOSIS — R45851 Suicidal ideations: Secondary | ICD-10-CM | POA: Diagnosis not present

## 2014-07-16 DIAGNOSIS — Z79899 Other long term (current) drug therapy: Secondary | ICD-10-CM | POA: Diagnosis not present

## 2014-07-16 LAB — CBC
HCT: 46.7 % (ref 39.0–52.0)
Hemoglobin: 15.5 g/dL (ref 13.0–17.0)
MCH: 26.6 pg (ref 26.0–34.0)
MCHC: 33.2 g/dL (ref 30.0–36.0)
MCV: 80.2 fL (ref 78.0–100.0)
PLATELETS: 319 10*3/uL (ref 150–400)
RBC: 5.82 MIL/uL — AB (ref 4.22–5.81)
RDW: 13.7 % (ref 11.5–15.5)
WBC: 5.3 10*3/uL (ref 4.0–10.5)

## 2014-07-16 LAB — COMPREHENSIVE METABOLIC PANEL
ALBUMIN: 4.8 g/dL (ref 3.5–5.0)
ALK PHOS: 81 U/L (ref 38–126)
ALT: 46 U/L (ref 17–63)
AST: 36 U/L (ref 15–41)
Anion gap: 8 (ref 5–15)
BUN: 8 mg/dL (ref 6–20)
CALCIUM: 9.5 mg/dL (ref 8.9–10.3)
CO2: 28 mmol/L (ref 22–32)
Chloride: 102 mmol/L (ref 101–111)
Creatinine, Ser: 0.96 mg/dL (ref 0.61–1.24)
GFR calc Af Amer: >60 mL/min (ref >60)
GFR calc non Af Amer: >60 mL/min (ref >60)
Glucose, Bld: 95 mg/dL (ref 65–99)
POTASSIUM: 4.4 mmol/L (ref 3.5–5.1)
SODIUM: 138 mmol/L (ref 135–145)
TOTAL PROTEIN: 8.6 g/dL — AB (ref 6.5–8.1)
Total Bilirubin: 0.3 mg/dL (ref 0.3–1.2)

## 2014-07-16 LAB — SALICYLATE LEVEL

## 2014-07-16 LAB — RAPID URINE DRUG SCREEN, HOSP PERFORMED
Amphetamines: NOT DETECTED
BENZODIAZEPINES: NOT DETECTED
Barbiturates: NOT DETECTED
Cocaine: NOT DETECTED
Opiates: NOT DETECTED
Tetrahydrocannabinol: NOT DETECTED

## 2014-07-16 LAB — ETHANOL: Alcohol, Ethyl (B): <5 mg/dL (ref <5)

## 2014-07-16 LAB — ACETAMINOPHEN LEVEL: Acetaminophen (Tylenol), Serum: <10 ug/mL — ABNORMAL LOW (ref 10–30)

## 2014-07-16 MED ORDER — IBUPROFEN 200 MG PO TABS
600.0000 mg | ORAL_TABLET | Freq: Three times a day (TID) | ORAL | Status: DC | PRN
  Administered 2014-07-16 – 2014-07-17 (×2): 600 mg via ORAL
  Filled 2014-07-16 (×3): qty 3

## 2014-07-16 MED ORDER — ONDANSETRON HCL 4 MG PO TABS: 4.0000 mg | ORAL_TABLET | Freq: Three times a day (TID) | ORAL | Status: DC | PRN

## 2014-07-16 MED ORDER — NEOMYCIN-POLYMYXIN-PRAMOXINE 1 % EX CREA
TOPICAL_CREAM | Freq: Two times a day (BID) | CUTANEOUS | Status: DC
  Filled 2014-07-16: qty 28

## 2014-07-16 MED ORDER — BACITRACIN-NEOMYCIN-POLYMYXIN 400-5-5000 EX OINT
TOPICAL_OINTMENT | Freq: Two times a day (BID) | CUTANEOUS | Status: DC
  Administered 2014-07-16: 1 via TOPICAL
  Filled 2014-07-16 (×3): qty 1

## 2014-07-16 MED ORDER — ZOLPIDEM TARTRATE 5 MG PO TABS
5.0000 mg | ORAL_TABLET | Freq: Every evening | ORAL | Status: DC | PRN
  Administered 2014-07-16: 5 mg via ORAL
  Filled 2014-07-16: qty 1

## 2014-07-16 MED ORDER — BACITRACIN ZINC 500 UNIT/GM EX OINT
TOPICAL_OINTMENT | CUTANEOUS | Status: AC
  Filled 2014-07-16: qty 2.7

## 2014-07-16 MED ORDER — TETANUS-DIPHTH-ACELL PERTUSSIS 5-2.5-18.5 LF-MCG/0.5 IM SUSP
0.5000 mL | Freq: Once | INTRAMUSCULAR | Status: AC
  Administered 2014-07-16: 0.5 mL via INTRAMUSCULAR
  Filled 2014-07-16: qty 0.5

## 2014-07-16 MED ORDER — LORAZEPAM 1 MG PO TABS
1.0000 mg | ORAL_TABLET | Freq: Three times a day (TID) | ORAL | Status: DC | PRN
  Administered 2014-07-16: 1 mg via ORAL
  Filled 2014-07-16: qty 1

## 2014-07-16 MED ORDER — ALUM & MAG HYDROXIDE-SIMETH 200-200-20 MG/5ML PO SUSP: 30.0000 mL | ORAL | Status: DC | PRN

## 2014-07-16 NOTE — BH Assessment (Signed)
[facility] Assessment Progress Note Called and scheduled pt's tele assessment with this clinician.  Called and attempted to gather clinical information from [facility], [place].  No answer.  Casimer LaniusKristen Atilla Zollner, MS, [facility] Therapeutic Triage Specialist [facility]

## 2014-07-16 NOTE — Progress Notes (Signed)
Writer called [facility], [facility]. Writer left voicemail for [facility] requesting call back at [phone] with regards to pt.  CSW awaiting call back and to follow up.  Melbourne Abtsatia Olivia Royse, LCSWA Disposition staff 07/16/2014 7:41 PM

## 2014-07-16 NOTE — BH Assessment (Signed)
[facility]. Pt has [facility] the [facility]:  [facility]  [name]  [facility]

## 2014-07-16 NOTE — ED Notes (Signed)
D Pt. Presented to [facility] after cutting his Right arm superficially with what he says was a hunting knife.  Writer applied neosporin and bandages to the wounds.  Pt. Is very childlike and attention seeking.    A Writer offered support and encouragement.  R Pt. Remains safe on the unit. Received motrin for his arm pain and is presently resting quietly.

## 2014-07-16 NOTE — ED Notes (Addendum)
Per EMS, pt picked up at [facility].  Pt walked there.  Pt had called his counselor telling them he cut himself.  Several small superficial cuts to rt arm and stomach.  Pt is hearing voices.  Has been here previously for same.  Vitals: 158/96, hr 100, resp 16

## 2014-07-16 NOTE — BH Assessment (Signed)
Consulted Hulan FessIjeoma Nwaeze, NP who agrees that pt meet inpatient treatment criteria. [facility]. TTS will contact other facilities for placement.  Dr. Freida BusmanAllen has been informed of the recommendation.

## 2014-07-16 NOTE — ED Notes (Signed)
Several superficial lacerations noted to R forearm and two puncture/small lacs noted to L thigh.

## 2014-07-16 NOTE — ED Notes (Signed)
Pt reports he got into an argument with "Caitlyn" and "[name]" over playing video games too much.  Sts "they told me to cut himself and they would leave him alone."  Sts he has been taking medications as prescribed.  Sts "I don't want to come back here.  I know, some of you are tired of seeing me and I'm tired of seeing you."  Pt continues to be SI w/ plan to cut himself.  Sts "[name] told me to cut my wrists."  Denies HI.
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
## 2014-07-16 NOTE — ED Provider Notes (Signed)
CSN: [id number]     Arrival date & time 07/16/14  1741 History  This chart was scribed for non-physician practitioner working, Elson AreasLeslie K Sofia, PA-C, with Lorre NickAnthony Allen, MD, by Modena JanskyAlbert Thayil, ED Scribe. This patient was seen in room WTR3/WLPT3 and the patient's care was started at 6:20 PM.  Chief Complaint  Patient presents with  . Suicidal   The history is provided by the patient. No language interpreter was used.    HPI Comments: [name] is a 24 y.o. male with a hx of SI who presents to the Emergency Department complaining of suicidal ideation. He reports that he has been feeling suicidal lately with plans to cut himself. He states that "[name] and [name] told me to cut myself". He states that he now has multiple lacerations to his right wrist and forearm.   Past Medical History  Diagnosis Date  . Asthma   . Bipolar 1 disorder   . ODD (oppositional defiant disorder)   . ADHD (attention deficit hyperactivity disorder)   . Schizophrenia   . Suicidal ideation   . Homicidal ideation   . Explosive personality disorder    History reviewed. No pertinent past surgical history. Family History  Problem Relation Age of Onset  . Asthma Mother   . Asthma Sister   . Thyroid disease     History  Substance Use Topics  . Smoking status: Never Smoker   . Smokeless tobacco: Not on file  . Alcohol Use: 0.0 oz/week    0 Standard drinks or equivalent per week    Review of Systems  Skin: Positive for wound.  Psychiatric/Behavioral: Positive for suicidal ideas and hallucinations.  All other systems reviewed and are negative.   Allergies  Carbamazepine; Geodon; Haldol; Strattera; Other; Shellfish allergy; and Food  Home Medications   Prior to Admission medications   Medication Sig Start Date End Date Taking? Authorizing Provider  albuterol (PROVENTIL HFA;VENTOLIN HFA) 108 (90 BASE) MCG/ACT inhaler Inhale 1 puff into the lungs every 6 (six) hours as needed for wheezing or shortness  of breath. 03/25/14  Yes Doris Cheadleeepak Advani, MD  ARIPiprazole 400 MG SUSR Inject 400 mg into the muscle every 30 (thirty) days.   Yes Historical Provider, MD  DiphenhydrAMINE HCl (BENADRYL PO) Take 1 tablet by mouth daily as needed (sleep,allergies).    Yes Historical Provider, MD  fluticasone (FLOVENT HFA) 110 MCG/ACT inhaler Inhale 2 puffs into the lungs every 12 (twelve) hours. 03/25/14  Yes Doris Cheadleeepak Advani, MD  QUEtiapine (SEROQUEL) 100 MG tablet Take 100 mg by mouth 2 (two) times daily.    Yes Historical Provider, MD  EPINEPHrine (EPIPEN 2-PAK IJ) Inject 1 each as directed as needed (anaphylaxis).     Historical Provider, MD  Vitamin D, Ergocalciferol, (DRISDOL) 50000 UNITS CAPS capsule Take 1 capsule (50,000 Units total) by mouth every 7 (seven) days. Patient not taking: Reported on 05/18/2014 03/30/14   Doris Cheadleeepak Advani, MD   BP 152/92 mmHg  Pulse 100  Temp(Src) 98.7 F (37.1 C) (Oral)  Resp 18  SpO2 98% Physical Exam  Constitutional: He is oriented to person, place, and time. He appears well-developed and well-nourished. No distress.  HENT:  Head: Normocephalic and atraumatic.  Neck: Neck supple. No tracheal deviation present.  Cardiovascular: Normal rate.   Pulmonary/Chest: Effort normal. No respiratory distress.  Musculoskeletal: Normal range of motion.  Neurological: He is alert and oriented to person, place, and time.  Skin: Skin is warm and dry.  Multiple superficial abrasions to right arm.  Psychiatric: He has a normal mood and affect. His behavior is normal.  Nursing note and vitals reviewed.   ED Course  Procedures (including critical care time) DIAGNOSTIC STUDIES: Oxygen Saturation is 98% on RA, normal by my interpretation.    COORDINATION OF CARE: 6:24 PM- Pt advised of plan for treatment which includes labs and pt agrees.  Labs Review Labs Reviewed  URINE RAPID DRUG SCREEN (HOSP PERFORMED)  ACETAMINOPHEN LEVEL  CBC  COMPREHENSIVE METABOLIC PANEL  ETHANOL  SALICYLATE  LEVEL   Results for orders placed or performed during the hospital encounter of 07/16/14  Acetaminophen level  Result Value Ref Range   Acetaminophen (Tylenol), Serum <10 (L) 10 - 30 ug/mL  CBC  Result Value Ref Range   WBC 5.3 4.0 - 10.5 K/uL   RBC 5.82 (H) 4.22 - 5.81 MIL/uL   Hemoglobin 15.5 13.0 - 17.0 g/dL   HCT 40.9 81.1 - 91.4 %   MCV 80.2 78.0 - 100.0 fL   MCH 26.6 26.0 - 34.0 pg   MCHC 33.2 30.0 - 36.0 g/dL   RDW 78.2 95.6 - 21.3 %   Platelets 319 150 - 400 K/uL  Comprehensive metabolic panel  Result Value Ref Range   Sodium 138 135 - 145 mmol/L   Potassium 4.4 3.5 - 5.1 mmol/L   Chloride 102 101 - 111 mmol/L   CO2 28 22 - 32 mmol/L   Glucose, Bld 95 65 - 99 mg/dL   BUN 8 6 - 20 mg/dL   Creatinine, Ser 0.86 0.61 - 1.24 mg/dL   Calcium 9.5 8.9 - 57.8 mg/dL   Total Protein 8.6 (H) 6.5 - 8.1 g/dL   Albumin 4.8 3.5 - 5.0 g/dL   AST 36 15 - 41 U/L   ALT 46 17 - 63 U/L   Alkaline Phosphatase 81 38 - 126 U/L   Total Bilirubin 0.3 0.3 - 1.2 mg/dL   GFR calc non Af Amer >60 >60 mL/min   GFR calc Af Amer >60 >60 mL/min   Anion gap 8 5 - 15  Ethanol (ETOH)  Result Value Ref Range   Alcohol, Ethyl (B) <5 <5 mg/dL  Salicylate level  Result Value Ref Range   Salicylate Lvl <4.0 2.8 - 30.0 mg/dL  Urine Drug Screen  Result Value Ref Range   Opiates NONE DETECTED NONE DETECTED   Cocaine NONE DETECTED NONE DETECTED   Benzodiazepines NONE DETECTED NONE DETECTED   Amphetamines NONE DETECTED NONE DETECTED   Tetrahydrocannabinol NONE DETECTED NONE DETECTED   Barbiturates NONE DETECTED NONE DETECTED   Dg Wrist Complete Right  07/14/2014   CLINICAL DATA:  Fall 3 days prior with right wrist pain.  EXAM: RIGHT WRIST - COMPLETE 3+ VIEW  COMPARISON:  None.  FINDINGS: No fracture or dislocation. The alignment and joint spaces are maintained. No focal soft tissue abnormality.  IMPRESSION: No fracture or dislocation of the right wrist.   Electronically Signed   By: Rubye Oaks M.D.    On: 07/14/2014 04:10    Imaging Review No results found.   EKG Interpretation None      MDM  Lacerations bandaged,  Tetanus shot given,  telepsch to talk to MD.    Final diagnoses:  Laceration of right upper arm, initial encounter  Suicidal thoughts  Hallucinations     I personally performed the services in this documentation, which was scribed in my presence.  The recorded information has been reviewed and considered.   Barnet Pall.  Elson AreasLeslie K Sofia, PA-C 07/16/14 1849  Lonia SkinnerLeslie K North BrooksvilleSofia, PA-C 07/16/14 1928  Lorre NickAnthony Allen, MD 07/16/14 [phone]

## 2014-07-17 DIAGNOSIS — S41111A Laceration without foreign body of right upper arm, initial encounter: Secondary | ICD-10-CM | POA: Insufficient documentation

## 2014-07-17 DIAGNOSIS — F250 Schizoaffective disorder, bipolar type: Secondary | ICD-10-CM

## 2014-07-17 DIAGNOSIS — R45851 Suicidal ideations: Secondary | ICD-10-CM | POA: Diagnosis not present

## 2014-07-17 MED ORDER — QUETIAPINE FUMARATE 100 MG PO TABS
100.0000 mg | ORAL_TABLET | Freq: Two times a day (BID) | ORAL | Status: DC
  Administered 2014-07-17: 100 mg via ORAL
  Filled 2014-07-17: qty 1

## 2014-07-17 NOTE — [facility] Suicide Risk Assessment (Signed)
Suicide Risk Assessment  Discharge Assessment   [facility]   Demographic Factors:  Male and Adolescent or young adult  Total Time spent with patient: 45 minutes  Musculoskeletal: Strength & Muscle Tone: within normal limits Gait & Station: normal Patient leans: N/A  Psychiatric Specialty Exam: Physical Exam  Review of Systems  Constitutional: Negative.   HENT: Negative.   Eyes: Negative.   Respiratory: Negative.   Cardiovascular: Negative.   Gastrointestinal: Negative.   Genitourinary: Negative.   Musculoskeletal: Negative.   Skin: Negative.   Neurological: Negative.   Endo/Heme/Allergies: Negative.   Psychiatric/Behavioral: Positive for depression and suicidal ideas.    Blood pressure 123/79, pulse 86, temperature 98.1 F (36.7 C), temperature source Oral, resp. rate 16, SpO2 99 %.There is no weight on file to calculate BMI.  General Appearance: Casual  Eye Contact::  Good  Speech:  Normal Rate  Volume:  Normal  Mood:  Euthymic  Affect:  Congruent  Thought Process:  Coherent  Orientation:  Full (Time, Place, and Person)  Thought Content:  WDL  Suicidal Thoughts:  No  Homicidal Thoughts:  No  Memory:  Immediate;   Good Recent;   Good Remote;   Good  Judgement:  Fair  Insight:  Fair  Psychomotor Activity:  Normal  Concentration:  Good  Recall:  Good  Fund of Knowledge:Good  Language: Good  Akathisia:  No  Handed:  Right  AIMS (if indicated):     Assets:  Health and safety inspectorinancial Resources/Insurance Housing Leisure Time Physical Health Resilience Social Support  ADL's:  Intact  Cognition: WNL  Sleep:         Has this patient used any form of tobacco in the last 30 days? (Cigarettes, Smokeless Tobacco, Cigars, and/or Pipes) No  Mental Status Per Nursing Assessment::   On Admission:   Suicidal ideations  Current Mental Status by Physician: Self-harm behaviors  Loss Factors: NA  Historical Factors: NA  Risk Reduction Factors:    Sense of responsibility to family, Living with another person, especially a relative, Positive social support and Positive therapeutic relationship  Continued Clinical Symptoms:  None  Cognitive Features That Contribute To Risk:  None    Suicide Risk:  Minimal: No identifiable suicidal ideation.  Patients presenting with no risk factors but with morbid ruminations; may be classified as minimal risk based on the severity of the depressive symptoms  Principal Problem: <principal problem not specified> Discharge Diagnoses:  Patient Active Problem List   Diagnosis Date Noted  . Suicidal ideations [R45.851]     Priority: High  . Schizoaffective disorder, bipolar type [F25.0]     Priority: High  . Drug overdose, intentional [T50.902A] 10/09/2013    Priority: High  . Aggression [F60.89] 08/22/2013    Priority: High  . Laceration of right upper arm [S41.111A]   . Suicidal thoughts [R45.851]   . Suicidal ideation [R45.851]   . Hallucinations [R44.3]   . 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 Asthma, chronic [J45.909] 03/25/2014  . Obesity, morbid [E66.01] 03/25/2014      Plan Of Care/Follow-up recommendations:  Activity:  as tolerated Diet:  heart healthy diet  Is patient on multiple antipsychotic therapies at discharge:  No   Has Patient had three or more failed trials of antipsychotic monotherapy by history:  No  Recommended Plan for Multiple Antipsychotic Therapies: NA    Willie Loy, PMH-NP 07/17/2014, 10:31 AM

## 2014-07-17 NOTE — BH Assessment (Signed)
07/17/14 Spoke with Herbert SetaHeather Replogle of [facility]/ACT team.  She reports that pt has not had major issues with week, although he has reported some SI and went to the ED earlier this week with SI and was released.  Pt is seen 2-3 times per week and appears to be taking his medicine.  He is a new client to [facility] in the past 3 months.  [facility] and told Herbert SetaHeather that he had cut himself and was suicidal.  She called 911. Daleen SquibbGreg Rhythm Wigfall, LCSW

## 2014-07-17 NOTE — ED Notes (Signed)
Pt states that he is ready to go home as soon as possible. Pt denies SI/HI or plans to harm himself at this time. No s/s of distress noted. Pt restless and remains at the window repeatedly requesting to leave.

## 2014-07-17 NOTE — ED Notes (Signed)
Patient standing at nursing station, interacting well with staff and peers. Pt cooperative and bright on approach.
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
## 2014-07-18 ENCOUNTER — Emergency Department (HOSPITAL_COMMUNITY)
Admission: EM | Admit: 2014-07-18 | Discharge: 2014-07-19 | Disposition: A | Discharge status: Home / Self Care | Payer: Medicaid Other | Attending: Emergency Medicine | Admitting: Emergency Medicine

## 2014-07-18 ENCOUNTER — Emergency Department (HOSPITAL_COMMUNITY): Payer: Medicaid Other

## 2014-07-18 DIAGNOSIS — R079 Chest pain, unspecified: Secondary | ICD-10-CM | POA: Diagnosis not present

## 2014-07-18 DIAGNOSIS — R0789 Other chest pain: Secondary | ICD-10-CM | POA: Diagnosis not present

## 2014-07-18 DIAGNOSIS — F319 Bipolar disorder, unspecified: Secondary | ICD-10-CM | POA: Insufficient documentation

## 2014-07-18 DIAGNOSIS — Z79899 Other long term (current) drug therapy: Secondary | ICD-10-CM | POA: Insufficient documentation

## 2014-07-18 DIAGNOSIS — F209 Schizophrenia, unspecified: Secondary | ICD-10-CM | POA: Diagnosis not present

## 2014-07-18 DIAGNOSIS — R45851 Suicidal ideations: Secondary | ICD-10-CM | POA: Diagnosis not present

## 2014-07-18 DIAGNOSIS — J45909 Unspecified asthma, uncomplicated: Secondary | ICD-10-CM | POA: Insufficient documentation

## 2014-07-18 DIAGNOSIS — F250 Schizoaffective disorder, bipolar type: Secondary | ICD-10-CM | POA: Diagnosis not present

## 2014-07-18 LAB — COMPREHENSIVE METABOLIC PANEL
ALK PHOS: 73 U/L (ref 38–126)
ALT: 39 U/L (ref 17–63)
ANION GAP: 9 (ref 5–15)
AST: 31 U/L (ref 15–41)
Albumin: 4.1 g/dL (ref 3.5–5.0)
BILIRUBIN TOTAL: 0.3 mg/dL (ref 0.3–1.2)
BUN: 8 mg/dL (ref 6–20)
CHLORIDE: 103 mmol/L (ref 101–111)
CO2: 26 mmol/L (ref 22–32)
Calcium: 9.3 mg/dL (ref 8.9–10.3)
Creatinine, Ser: 1.13 mg/dL (ref 0.61–1.24)
GLUCOSE: 98 mg/dL (ref 65–99)
Potassium: 3.9 mmol/L (ref 3.5–5.1)
Sodium: 138 mmol/L (ref 135–145)
Total Protein: 7.7 g/dL (ref 6.5–8.1)

## 2014-07-18 LAB — CBC
HCT: 45.1 % (ref 39.0–52.0)
Hemoglobin: 15.4 g/dL (ref 13.0–17.0)
MCH: 27.2 pg (ref 26.0–34.0)
MCHC: 34.1 g/dL (ref 30.0–36.0)
MCV: 79.7 fL (ref 78.0–100.0)
Platelets: 311 10*3/uL (ref 150–400)
RBC: 5.66 MIL/uL (ref 4.22–5.81)
RDW: 13.6 % (ref 11.5–15.5)
WBC: 7.1 10*3/uL (ref 4.0–10.5)

## 2014-07-18 LAB — I-STAT TROPONIN, ED: TROPONIN I, POC: 0 ng/mL (ref 0.00–0.08)

## 2014-07-18 LAB — RAPID URINE DRUG SCREEN, HOSP PERFORMED
Amphetamines: NOT DETECTED
BARBITURATES: NOT DETECTED
Benzodiazepines: NOT DETECTED
COCAINE: NOT DETECTED
OPIATES: NOT DETECTED
Tetrahydrocannabinol: NOT DETECTED

## 2014-07-18 LAB — ACETAMINOPHEN LEVEL: Acetaminophen (Tylenol), Serum: <10 ug/mL — ABNORMAL LOW (ref 10–30)

## 2014-07-18 LAB — ETHANOL: Alcohol, Ethyl (B): <5 mg/dL (ref <5)

## 2014-07-18 LAB — SALICYLATE LEVEL

## 2014-07-18 MED ORDER — DIPHENHYDRAMINE HCL 25 MG PO CAPS
25.0000 mg | ORAL_CAPSULE | Freq: Once | ORAL | Status: AC
  Administered 2014-07-18: 25 mg via ORAL
  Filled 2014-07-18: qty 1

## 2014-07-18 MED ORDER — IBUPROFEN 800 MG PO TABS
800.0000 mg | ORAL_TABLET | Freq: Once | ORAL | Status: AC
  Administered 2014-07-18: 800 mg via ORAL
  Filled 2014-07-18: qty 1

## 2014-07-18 NOTE — ED Notes (Signed)
Patient transported to X-ray 

## 2014-07-18 NOTE — ED Notes (Signed)
Inventory belongings, wanded, and placed in soiled utility, sheet placed in pod c book.

## 2014-07-18 NOTE — ED Provider Notes (Signed)
This chart was scribed for Layla MawKristen N Rasheed Welty, DO by Abel PrestoKara Demonbreun, ED Scribe. This patient was seen in room B16C/B16C.  TIME SEEN: 11:15 PM   CHIEF COMPLAINT: Chest pain and suicidal ideation  HPI:  [name] is a 24 y.o. male who presents to the Emergency Department complaining of sharp chest pain with onset 1 hour ago. Pt states pain is aggravated by deep inspiration. He describes pain as "my heart hurts." Pt notes mild diarrhea today. Pt with h/o bipolar disorder, ODD, ADHD, schizophrenia, suicidal ideation, homicidal ideation, and explosive personality disorder. Pt also reports suicidal ideation and hallucinations. Pt states he sees "Caitlyn and [name]" who tell him to hurt himself. Pt notes frequent EtOH use but not today. Pt has been compliant with his medication.  Pt was [facility] same 2 days ago and d/c yesterday. Pt denies recent surgery and prolonged travel, immobilization such as fracture or hospitalization, trauma. He denies h/o PE/DVT. Pt denies bilateral leg swelling or pain, fever, cough, nausea, and vomiting.    ROS: See HPI Constitutional: no fever  Eyes: no drainage  ENT: no runny nose   Cardiovascular:  chest pain  Resp: no SOB  GI: no vomiting GU: no dysuria Integumentary: no rash  Allergy: no hives  Musculoskeletal: no leg swelling  Neurological: no slurred speech ROS otherwise negative  PAST MEDICAL HISTORY/PAST SURGICAL HISTORY:  Past Medical History  Diagnosis Date  . Asthma   . Bipolar 1 disorder   . ODD (oppositional defiant disorder)   . ADHD (attention deficit hyperactivity disorder)   . Schizophrenia   . Suicidal ideation   . Homicidal ideation   . Explosive personality disorder     MEDICATIONS:  Prior to Admission medications   Medication Sig Start Date End Date Taking? Authorizing Provider  albuterol (PROVENTIL HFA;VENTOLIN HFA) 108 (90 BASE) MCG/ACT inhaler Inhale 1 puff into the lungs every 6 (six) hours as needed for wheezing  or shortness of breath. 03/25/14   Doris Cheadleeepak Advani, MD  ARIPiprazole 400 MG SUSR Inject 400 mg into the muscle every 30 (thirty) days.    Historical Provider, MD  DiphenhydrAMINE HCl (BENADRYL PO) Take 1 tablet by mouth daily as needed (sleep,allergies).     Historical Provider, MD  EPINEPHrine (EPIPEN 2-PAK IJ) Inject 1 each as directed as needed (anaphylaxis).     Historical Provider, MD  fluticasone (FLOVENT HFA) 110 MCG/ACT inhaler Inhale 2 puffs into the lungs every 12 (twelve) hours. 03/25/14   Doris Cheadleeepak Advani, MD  QUEtiapine (SEROQUEL) 100 MG tablet Take 100 mg by mouth 2 (two) times daily.     Historical Provider, MD  Vitamin D, Ergocalciferol, (DRISDOL) 50000 UNITS CAPS capsule Take 1 capsule (50,000 Units total) by mouth every 7 (seven) days. Patient not taking: Reported on 05/18/2014 03/30/14   Doris Cheadleeepak Advani, MD    ALLERGIES:  Allergies  Allergen Reactions  . Carbamazepine Anaphylaxis, Other (See Comments) and Cough    Cough up blood  . Geodon [Ziprasidone Hcl] Anaphylaxis and Other (See Comments)    " Causes my throat to close"  . Haldol [Haloperidol Lactate] Anaphylaxis and Other (See Comments)    Patient reports that he stopped taking this because of throat swelling.   [name]. Strattera [Atomoxetine] Anaphylaxis, Other (See Comments) and Cough    Cough up blood  . Other Nausea Only and Other (See Comments)    Apple Juice, stomach hurts and itchy throat   . Shellfish Allergy Swelling    Throat swelling  .  Food Nausea And Vomiting    Lamb/mutton.     SOCIAL HISTORY:  History  Substance Use Topics  . Smoking status: Never Smoker   . Smokeless tobacco: Not on file  . Alcohol Use: 0.0 oz/week    0 Standard drinks or equivalent per week     Comment: pt reported has not drank since last week    FAMILY HISTORY: Family History  Problem Relation Age of Onset  . Asthma Mother   . Asthma Sister   . Thyroid disease      EXAM: BP 146/76 mmHg  Pulse 85  Temp(Src) 98.2 F (36.8 C)  (Oral)  SpO2 97% CONSTITUTIONAL: Alert and oriented and responds appropriately to questions. Well-appearing; well-nourished HEAD: Normocephalic EYES: Conjunctivae clear, PERRL ENT: normal nose; no rhinorrhea; moist mucous membranes; pharynx without lesions noted NECK: Supple, no meningismus, no LAD  CARD: RRR; S1 and S2 appreciated; no murmurs, no clicks, no rubs, no gallops RESP: Normal chest excursion without splinting or tachypnea; breath sounds clear and equal bilaterally; no wheezes, no rhonchi, no rales, no hypoxia or respiratory distress, chest wall is nontender to palpation without crepitus or ecchymosis or deformity, speaking full sentences ABD/GI: Normal bowel sounds; non-distended; soft, non-tender, no rebound, no guarding BACK:  The back appears normal and is non-tender to palpation, there is no CVA tenderness EXT: Normal ROM in all joints; non-tender to palpation; no edema; normal capillary refill; no cyanosis    SKIN: Normal color for age and race; warm NEURO: Moves all extremities equally PSYCH: Patient induces suicidality without plan. Also endorses visual and auditory hallucinations. Grooming and personal hygiene are appropriate.  MEDICAL DECISION MAKING: Patient here with atypical chest pain. Hemodynamically stable. No risk factors for PE or ACS. No tachypnea, hypoxia, tachycardia. EKG shows no new ischemic changes. Labs have been ordered in triage and are currently pending. Will order chest x-ray. Will give ibuprofen for pain. Also endorsing suicidal thoughts and hallucinations which is also chronic for patient. Will talk to his ACT team.  ED PROGRESS: Patient's labs have been unremarkable. Chest x-ray clear. Have discussed with him alternating Tylenol and ibuprofen for pain. He appears very comfortable and again has no risk factors for ACS, pulmonary embolus and I doubt dissection. I do not think his chest pain is life-threatening.   Discussed patient's SI with patient's ACT  team, Heather [phone]).  We both agree the patient can be safely discharged home. He has an appointment with them tomorrow. Discussed this with patient and he is comfortable with this plan.     EKG Interpretation  Date/Time:  Sunday Jul 18 2014 22:48:22 EDT Ventricular Rate:  90 PR Interval:  153 QRS Duration: 93 QT Interval:  339 QTC Calculation: 415 R Axis:   39 Text Interpretation:  Sinus rhythm No significant change since last tracing Confirmed by Bryce Kimble,  DO, Leslee Haueter [phone]) on 07/18/2014 10:57:22 PM        I personally performed the services described in this documentation, which was scribed in my presence. The recorded information has been reviewed and is accurate.     Layla Maw Marquail Bradwell, DO 07/19/14 [phone]

## 2014-07-18 NOTE — ED Notes (Signed)
Pt. In wine scrubs, sitter bedside.

## 2014-07-18 NOTE — ED Notes (Signed)
Pt arrives with CP ongoing for about an hour, worse with deep inspiration. Albuterol inhaler x2 made it worse. Pt appears to not be in any pain, comfortable in triage. Called ACT on EMS ride. Attempted suicide yesterday by cutting.

## 2014-07-18 NOTE — ED Notes (Addendum)
Patient transported to X-ray 

## 2014-07-19 ENCOUNTER — Emergency Department (HOSPITAL_COMMUNITY): Payer: Medicaid Other

## 2014-07-19 ENCOUNTER — Encounter: Payer: Self-pay | Admitting: Internal Medicine

## 2014-07-19 ENCOUNTER — Encounter (HOSPITAL_COMMUNITY): Payer: Self-pay | Admitting: Emergency Medicine

## 2014-07-19 ENCOUNTER — Emergency Department (HOSPITAL_COMMUNITY)
Admission: EM | Admit: 2014-07-19 | Discharge: 2014-07-20 | Disposition: A | Discharge status: Home / Self Care | Payer: Medicaid Other | Attending: Emergency Medicine | Admitting: Emergency Medicine

## 2014-07-19 ENCOUNTER — Emergency Department (HOSPITAL_COMMUNITY)
Admission: EM | Admit: 2014-07-19 | Discharge: 2014-07-19 | Disposition: A | Discharge status: Home / Self Care | Payer: Medicaid Other | Attending: Emergency Medicine | Admitting: Emergency Medicine

## 2014-07-19 ENCOUNTER — Ambulatory Visit: Payer: Medicaid Other | Attending: Internal Medicine | Admitting: Internal Medicine

## 2014-07-19 VITALS — BP 118/82 | HR 64 | Temp 98.0°F | Resp 16 | Wt 315.8 lb

## 2014-07-19 DIAGNOSIS — F319 Bipolar disorder, unspecified: Secondary | ICD-10-CM | POA: Insufficient documentation

## 2014-07-19 DIAGNOSIS — Z79899 Other long term (current) drug therapy: Secondary | ICD-10-CM | POA: Insufficient documentation

## 2014-07-19 DIAGNOSIS — R079 Chest pain, unspecified: Secondary | ICD-10-CM | POA: Diagnosis present

## 2014-07-19 DIAGNOSIS — F209 Schizophrenia, unspecified: Secondary | ICD-10-CM | POA: Insufficient documentation

## 2014-07-19 DIAGNOSIS — F39 Unspecified mood [affective] disorder: Secondary | ICD-10-CM | POA: Diagnosis not present

## 2014-07-19 DIAGNOSIS — R61 Generalized hyperhidrosis: Secondary | ICD-10-CM | POA: Insufficient documentation

## 2014-07-19 DIAGNOSIS — R51 Headache: Secondary | ICD-10-CM | POA: Diagnosis not present

## 2014-07-19 DIAGNOSIS — H539 Unspecified visual disturbance: Secondary | ICD-10-CM | POA: Insufficient documentation

## 2014-07-19 DIAGNOSIS — F913 Oppositional defiant disorder: Secondary | ICD-10-CM | POA: Insufficient documentation

## 2014-07-19 DIAGNOSIS — J45909 Unspecified asthma, uncomplicated: Secondary | ICD-10-CM | POA: Insufficient documentation

## 2014-07-19 DIAGNOSIS — J45901 Unspecified asthma with (acute) exacerbation: Secondary | ICD-10-CM | POA: Insufficient documentation

## 2014-07-19 DIAGNOSIS — E559 Vitamin D deficiency, unspecified: Secondary | ICD-10-CM | POA: Diagnosis not present

## 2014-07-19 DIAGNOSIS — R6883 Chills (without fever): Secondary | ICD-10-CM | POA: Insufficient documentation

## 2014-07-19 DIAGNOSIS — R200 Anesthesia of skin: Secondary | ICD-10-CM | POA: Diagnosis not present

## 2014-07-19 DIAGNOSIS — R45851 Suicidal ideations: Secondary | ICD-10-CM | POA: Diagnosis present

## 2014-07-19 DIAGNOSIS — F250 Schizoaffective disorder, bipolar type: Secondary | ICD-10-CM | POA: Diagnosis present

## 2014-07-19 DIAGNOSIS — R0789 Other chest pain: Secondary | ICD-10-CM | POA: Insufficient documentation

## 2014-07-19 DIAGNOSIS — R443 Hallucinations, unspecified: Secondary | ICD-10-CM

## 2014-07-19 LAB — BASIC METABOLIC PANEL
Anion gap: 8 (ref 5–15)
Anion gap: 10 (ref 5–15)
BUN: 9 mg/dL (ref 6–20)
BUN: 6 mg/dL (ref 6–20)
CALCIUM: 9.3 mg/dL (ref 8.9–10.3)
CHLORIDE: 104 mmol/L (ref 101–111)
CO2: 25 mmol/L (ref 22–32)
CO2: 26 mmol/L (ref 22–32)
Calcium: 9.4 mg/dL (ref 8.9–10.3)
Chloride: 101 mmol/L (ref 101–111)
Creatinine, Ser: 1.06 mg/dL (ref 0.61–1.24)
Creatinine, Ser: 0.95 mg/dL (ref 0.61–1.24)
GFR calc non Af Amer: >60 mL/min (ref >60)
GFR calc non Af Amer: >60 mL/min (ref >60)
GLUCOSE: 108 mg/dL — AB (ref 65–99)
Glucose, Bld: 95 mg/dL (ref 65–99)
POTASSIUM: 4.2 mmol/L (ref 3.5–5.1)
POTASSIUM: 3.6 mmol/L (ref 3.5–5.1)
SODIUM: 137 mmol/L (ref 135–145)
Sodium: 137 mmol/L (ref 135–145)

## 2014-07-19 LAB — CBC
HEMATOCRIT: 43.8 % (ref 39.0–52.0)
HEMATOCRIT: 45.6 % (ref 39.0–52.0)
Hemoglobin: 15.1 g/dL (ref 13.0–17.0)
Hemoglobin: 15.5 g/dL (ref 13.0–17.0)
MCH: 27.2 pg (ref 26.0–34.0)
MCH: 27.1 pg (ref 26.0–34.0)
MCHC: 34.5 g/dL (ref 30.0–36.0)
MCHC: 34 g/dL (ref 30.0–36.0)
MCV: 78.9 fL (ref 78.0–100.0)
MCV: 79.6 fL (ref 78.0–100.0)
PLATELETS: 301 10*3/uL (ref 150–400)
Platelets: 330 10*3/uL (ref 150–400)
RBC: 5.55 MIL/uL (ref 4.22–5.81)
RBC: 5.73 MIL/uL (ref 4.22–5.81)
RDW: 13.6 % (ref 11.5–15.5)
RDW: 13.7 % (ref 11.5–15.5)
WBC: 6.7 10*3/uL (ref 4.0–10.5)
WBC: 6.3 10*3/uL (ref 4.0–10.5)

## 2014-07-19 LAB — RAPID URINE DRUG SCREEN, HOSP PERFORMED
AMPHETAMINES: NOT DETECTED
Barbiturates: NOT DETECTED
Benzodiazepines: NOT DETECTED
COCAINE: NOT DETECTED
OPIATES: NOT DETECTED
Tetrahydrocannabinol: NOT DETECTED

## 2014-07-19 LAB — I-STAT TROPONIN, ED
TROPONIN I, POC: 0 ng/mL (ref 0.00–0.08)
TROPONIN I, POC: 0 ng/mL (ref 0.00–0.08)

## 2014-07-19 LAB — D-DIMER, QUANTITATIVE (NOT AT ARMC): D DIMER QUANT: 0.34 ug{FEU}/mL (ref 0.00–0.48)

## 2014-07-19 LAB — ETHANOL: Alcohol, Ethyl (B): <5 mg/dL (ref <5)

## 2014-07-19 LAB — BRAIN NATRIURETIC PEPTIDE: B Natriuretic Peptide: 2.3 pg/mL (ref 0.0–100.0)

## 2014-07-19 MED ORDER — ALUM & MAG HYDROXIDE-SIMETH 200-200-20 MG/5ML PO SUSP: 30.0000 mL | ORAL | Status: DC | PRN

## 2014-07-19 MED ORDER — IBUPROFEN 800 MG PO TABS
800.0000 mg | ORAL_TABLET | Freq: Once | ORAL | Status: AC
  Administered 2014-07-19: 800 mg via ORAL
  Filled 2014-07-19: qty 1

## 2014-07-19 MED ORDER — ACETAMINOPHEN 325 MG PO TABS
650.0000 mg | ORAL_TABLET | ORAL | Status: DC | PRN
  Administered 2014-07-20: 650 mg via ORAL
  Filled 2014-07-19: qty 2

## 2014-07-19 MED ORDER — IBUPROFEN 400 MG PO TABS: 600.0000 mg | ORAL_TABLET | Freq: Three times a day (TID) | ORAL | Status: DC | PRN

## 2014-07-19 MED ORDER — NICOTINE 21 MG/24HR TD PT24
21.0000 mg | MEDICATED_PATCH | Freq: Every day | TRANSDERMAL | Status: DC
  Administered 2014-07-20: 21 mg via TRANSDERMAL
  Filled 2014-07-19: qty 1

## 2014-07-19 MED ORDER — ALBUTEROL SULFATE HFA 108 (90 BASE) MCG/ACT IN AERS: 1.0000 | INHALATION_SPRAY | Freq: Four times a day (QID) | RESPIRATORY_TRACT | Status: DC | PRN

## 2014-07-19 MED ORDER — ZOLPIDEM TARTRATE 5 MG PO TABS: 5.0000 mg | ORAL_TABLET | Freq: Every evening | ORAL | Status: DC | PRN

## 2014-07-19 MED ORDER — ONDANSETRON HCL 4 MG PO TABS: 4.0000 mg | ORAL_TABLET | Freq: Three times a day (TID) | ORAL | Status: DC | PRN

## 2014-07-19 MED ORDER — QUETIAPINE FUMARATE 25 MG PO TABS
100.0000 mg | ORAL_TABLET | Freq: Two times a day (BID) | ORAL | Status: DC
  Administered 2014-07-20: 100 mg via ORAL
  Filled 2014-07-19: qty 4

## 2014-07-19 MED ORDER — ARIPIPRAZOLE ER 400 MG IM SUSR: 400.0000 mg | INTRAMUSCULAR | Status: DC

## 2014-07-19 MED ORDER — FLUTICASONE PROPIONATE HFA 110 MCG/ACT IN AERO
2.0000 | INHALATION_SPRAY | Freq: Two times a day (BID) | RESPIRATORY_TRACT | Status: DC
  Filled 2014-07-19 (×3): qty 12

## 2014-07-19 NOTE — ED Notes (Addendum)
Pt c/o central chest pain, without radiation. Pain associated with shortness of breath and dizziness. Pt has been seen for similar symptoms in the past two days with multiple visits noted within the same day. Pt also c/o SI with auditory/visual hallucinations and "the voices are telling me to run out in front of a car." Pt is hearing voices from "[name]" and "[name]" telling him to harm self and others. When asked about compliance with medications, pt sts "I think I took them." Sitter at bedside. Pt calm and cooperative at this time.

## 2014-07-19 NOTE — ED Notes (Signed)
Sitter has been called.
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
## 2014-07-19 NOTE — ED Notes (Addendum)
Pt. arrived with EMS from home reports central chest pain onset today with auditory hallucinations and took 2 tabs of Quetiapine Fumarate 100 mg while in the EMS truck . Respirations unlabored . Denies suicidal or homicidal ideation .

## 2014-07-19 NOTE — ED Provider Notes (Signed)
CSN: [id number]     Arrival date & time 07/19/14  0314 History  This chart was scribed for Devoria Albe, MD by [place] Peace, ED Scribe. The patient was seen in A08C/A08C. The patient's care was started at 3:50 AM.     Chief Complaint  Patient presents with  . Chest Pain      Patient is a 24 y.o. male presenting with chest pain. The history is provided by the patient. No language interpreter was used.  Chest Pain Associated symptoms: diaphoresis, headache, numbness and shortness of breath   Associated symptoms: no nausea and not vomiting   HPI Comments: Deryk Boen is a 24 y.o. male who presents to the Emergency Department complaining of intermittent sharp left-sided chest pain (last roughly few minutes at a time) and sometimes it moves up and down his left chest over past 2 weeks with constant SOB and generalized intermittent headache (last about 1 hr at a time) after pt explains that he tried to overdose on over 50 pills of Benadryl. He adds that he was hospitalized for 1 week but has been experiencing chest pain ever since and while he was in the hospital. Pt states he feels like he is slowly dying. He also states his chest feels numb. Pt reports that he is also experiencing numbness in his right hand that started after I entered his room  which he notes just started right now during patient encounter. He also complains of blurred vision,and denies diaphoresis. He denies any nausea or vomiting. Nothing makes the chest pain worse, nothing makes it feel better. Pt has appointment to see his PCP Dr. Venida Jarvis at 2:30 PM this afternoon. Pt denies SI tonight. Pt is requesting a MRI to evaluate his chest pain.     Pt is non-smoker. He further reports that he drinks about 1/5 of liquor a day but hasn't drank in the past week. History of SI, HI, and Schizophrenia.   PCP [facility] has an appt at 2:30 pm today   Past Medical History  Diagnosis Date  . Asthma   . Bipolar 1 disorder   . ODD (oppositional  defiant disorder)   . ADHD (attention deficit hyperactivity disorder)   . Schizophrenia   . Suicidal ideation   . Homicidal ideation   . Explosive personality disorder    History reviewed. No pertinent past surgical history. Family History  Problem Relation Age of Onset  . Asthma Mother   . Asthma Sister   . Thyroid disease     History  Substance Use Topics  . Smoking status: Never Smoker   . Smokeless tobacco: Not on file  . Alcohol Use: 0.0 oz/week    0 Standard drinks or equivalent per week     Comment: pt reported has not drank since last week  lives with mother States he drinks a fifth a day but has been sober for 1 week  Review of Systems  Constitutional: Positive for chills and diaphoresis.  Eyes: Positive for visual disturbance (blurred vision).  Respiratory: Positive for shortness of breath.   Cardiovascular: Positive for chest pain.  Gastrointestinal: Negative for nausea and vomiting.  Neurological: Positive for numbness and headaches.  All other systems reviewed and are negative.     Allergies  Carbamazepine; Geodon; Haldol; Strattera; Other; Shellfish allergy; and Food  Home Medications   Prior to Admission medications   Medication Sig Start Date End Date Taking? Authorizing Provider  albuterol (PROVENTIL HFA;VENTOLIN HFA) 108 (90 BASE) MCG/ACT inhaler Inhale 1  puff into the lungs every 6 (six) hours as needed for wheezing or shortness of breath. 03/25/14  Yes Doris Cheadle, MD  ARIPiprazole 400 MG SUSR Inject 400 mg into the muscle every 30 (thirty) days.   Yes Historical Provider, MD  DiphenhydrAMINE HCl (BENADRYL PO) Take 1 tablet by mouth daily as needed (sleep,allergies).    Yes Historical Provider, MD  fluticasone (FLOVENT HFA) 110 MCG/ACT inhaler Inhale 2 puffs into the lungs every 12 (twelve) hours. 03/25/14  Yes Doris Cheadle, MD  QUEtiapine (SEROQUEL) 100 MG tablet Take 100 mg by mouth 2 (two) times daily.    Yes Historical Provider, MD  EPINEPHrine  (EPIPEN 2-PAK IJ) Inject 1 each as directed as needed (anaphylaxis).     Historical Provider, MD  Vitamin D, Ergocalciferol, (DRISDOL) 50000 UNITS CAPS capsule Take 1 capsule (50,000 Units total) by mouth every 7 (seven) days. Patient not taking: Reported on 05/18/2014 03/30/14   Doris Cheadle, MD  Injectable ability 400 mg the first of every month   Temp(Src) 98.2 F (36.8 C) (Oral)  Ht  (1.778 m)  Vital signs normal   Physical Exam  Constitutional: He is oriented to person, place, and time. He appears well-developed and well-nourished.  Non-toxic appearance. He does not appear ill. No distress.  HENT:  Head: Normocephalic and atraumatic.  Right Ear: External ear normal.  Left Ear: External ear normal.  Nose: Nose normal. No mucosal edema or rhinorrhea.  Mouth/Throat: Oropharynx is clear and moist and mucous membranes are normal. No dental abscesses or uvula swelling.  Eyes: Conjunctivae and EOM are normal. Pupils are equal, round, and reactive to light.  Neck: Normal range of motion and full passive range of motion without pain. Neck supple.  Cardiovascular: Normal rate, regular rhythm and normal heart sounds.  Exam reveals no gallop and no friction rub.   No murmur heard. Pulmonary/Chest: Effort normal and breath sounds normal. No respiratory distress. He has no wheezes. He has no rhonchi. He has no rales. He exhibits no tenderness and no crepitus.  Abdominal: Soft. Normal appearance and bowel sounds are normal. He exhibits no distension. There is no tenderness. There is no rebound and no guarding.  Musculoskeletal: Normal range of motion. He exhibits no edema or tenderness.  Moves all extremities well.   Neurological: He is alert and oriented to person, place, and time. He has normal strength. No cranial nerve deficit.  Skin: Skin is warm, dry and intact. No rash noted. No erythema. No pallor.  Psychiatric: His speech is normal and behavior is normal. His mood appears not  anxious.  Flat affect.   Nursing note and vitals reviewed.   ED Course  Procedures (including critical care time)  Medications  ibuprofen (ADVIL,MOTRIN) tablet 800 mg (not administered)      3:57 AM- Treatment plan was discussed with patient who verbalizes understanding and agrees. I have tried to explain to patient that MRI would not be a test of choice to evaluate his chest pain however he does not understand that.  Pt was given his test results at time of discharge. He has an appt later today with his PCP, and he was encouraged to keep that appointment.     Labs Review Results for orders placed or performed during the hospital encounter of 07/19/14  CBC  Result Value Ref Range   WBC 6.7 4.0 - 10.5 K/uL   RBC 5.55 4.22 - 5.81 MIL/uL   Hemoglobin 15.1 13.0 - 17.0 g/dL   HCT 43.8  39.0 - 52.0 %   MCV 78.9 78.0 - 100.0 fL   MCH 27.2 26.0 - 34.0 pg   MCHC 34.5 30.0 - 36.0 g/dL   RDW 16.113.6 09.611.5 - 04.515.5 %   Platelets 301 150 - 400 K/uL  Basic metabolic panel  Result Value Ref Range   Sodium 137 135 - 145 mmol/L   Potassium 4.2 3.5 - 5.1 mmol/L   Chloride 104 101 - 111 mmol/L   CO2 25 22 - 32 mmol/L   Glucose, Bld 95 65 - 99 mg/dL   BUN 9 6 - 20 mg/dL   Creatinine, Ser 4.091.06 0.61 - 1.24 mg/dL   Calcium 9.4 8.9 - 81.110.3 mg/dL   GFR calc non Af Amer >60 >60 mL/min   GFR calc Af Amer >60 >60 mL/min   Anion gap 8 5 - 15  BNP (order ONLY if patient complains of dyspnea/SOB AND you have documented it for THIS visit)  Result Value Ref Range   B Natriuretic Peptide 2.3 0.0 - 100.0 pg/mL  D-dimer, quantitative  Result Value Ref Range   D-Dimer, Quant 0.34 0.00 - 0.48 ug/mL-FEU  I-stat troponin, ED  ([facility])  Result Value Ref Range   Troponin i, poc 0.00 0.00 - 0.08 ng/mL   Comment 3           Laboratory interpretation all normal      Imaging Review Dg Chest 2 View  07/19/2014   CLINICAL DATA:  Left-sided chest pain for several weeks.  EXAM: CHEST  2 VIEW   COMPARISON:  None.  FINDINGS: Examination is degraded due to patient body habitus.  Normal cardiac silhouette and mediastinal contours. Evaluation the retrosternal clear space obscured secondary to overlying soft tissues. No focal parenchymal opacities. No pleural effusion or pneumothorax. No evidence of edema. No acute osseus abnormalities.  IMPRESSION: No acute cardiopulmonary disease.   Electronically Signed   By: Simonne ComeJohn  Watts M.D.   On: 07/19/2014 00:22     EKG Interpretation   Date/Time:  Monday Jul 19 2014 03:20:52 EDT Ventricular Rate:  84 PR Interval:  163 QRS Duration: 90 QT Interval:  343 QTC Calculation: 405 R Axis:   39 Text Interpretation:  Sinus rhythm ST elev, probable normal early repol  pattern No significant change since last tracing 18 Jul 2014 Confirmed by  Banks Chaikin  MD-I, Florencia Zaccaro ([id number]) on 07/19/2014 3:33:50 AM         MDM   Final diagnoses:  Atypical chest pain   Plan discharge  Devoria AlbeIva Macintyre Alexa, MD, FACEP   I personally performed the services described in this documentation, which was scribed in my presence. The recorded information has been reviewed and considered.  Devoria AlbeIva Lillan Mccreadie, MD, Concha PyoFACEP    Tashayla Therien, MD 07/19/14 [phone]

## 2014-07-19 NOTE — ED Notes (Signed)
Pt to ED via [facility] for c/o sharp L sided chest pain with sob and nausea since Sunday around 1pm.  Pt states he was seen in ED yesterday for same and symptoms have not resolved.  Reports intermittent pain gets worse with each occurrence.  NAD on arrival.

## 2014-07-19 NOTE — BH Assessment (Addendum)
Reviewed ED notes prior to initiating assessment. Pt reports SI, AVH with command and plans to choke himself. He was told to come to ED because of chest pains. Per ED notes pt is medically cleared for assessment.   Called [facility], and spoke with Herbert SetaHeather, who reports she was previously informed by nurse practitioner that pt has arrived.   Requested cart be placed with pt for assessment.   Assessment to commence shortly.    Clista BernhardtNancy Clelia Trabucco, [facility] Triage Specialist 07/19/2014 10:47 PM

## 2014-07-19 NOTE — ED Notes (Signed)
Purple scrubs given to pt. , security notified to wand pt.
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
## 2014-07-19 NOTE — ED Notes (Signed)
TTS in progress 

## 2014-07-19 NOTE — ED Notes (Signed)
Pt.stated suicidal ideation while waiting for a room at triage , plans to choke himself with a blanket . Staffing and charge nurse notified for his sitter . Pt. wanded by security at triage.

## 2014-07-19 NOTE — Discharge Instructions (Signed)
Chest Pain (Nonspecific) °It is often hard to give a specific diagnosis for the cause of chest pain. There is always a chance that your pain could be related to something serious, such as a heart attack or a blood clot in the lungs. You need to follow up with your health care provider for further evaluation. °CAUSES  °· Heartburn. °· Pneumonia or bronchitis. °· Anxiety or stress. °· Inflammation around your heart (pericarditis) or lung (pleuritis or pleurisy). °· A blood clot in the lung. °· A collapsed lung (pneumothorax). It can develop suddenly on its own (spontaneous pneumothorax) or from trauma to the chest. °· Shingles infection (herpes zoster virus). °The chest wall is composed of bones, muscles, and cartilage. Any of these can be the source of the pain. °· The bones can be bruised by injury. °· The muscles or cartilage can be strained by coughing or overwork. °· The cartilage can be affected by inflammation and become sore (costochondritis). °DIAGNOSIS  °Lab tests or other studies may be needed to find the cause of your pain. Your health care provider may have you take a test called an ambulatory electrocardiogram (ECG). An ECG records your heartbeat patterns over a 24-hour period. You may also have other tests, such as: °· Transthoracic echocardiogram (TTE). During echocardiography, sound waves are used to evaluate how blood flows through your heart. °· Transesophageal echocardiogram (TEE). °· Cardiac monitoring. This allows your health care provider to monitor your heart rate and rhythm in real time. °· Holter monitor. This is a portable device that records your heartbeat and can help diagnose heart arrhythmias. It allows your health care provider to track your heart activity for several days, if needed. °· Stress tests by exercise or by giving medicine that makes the heart beat faster. °TREATMENT  °· Treatment depends on what may be causing your chest pain. Treatment may include: °¨ Acid blockers for  heartburn. °¨ Anti-inflammatory medicine. °¨ Pain medicine for inflammatory conditions. °¨ Antibiotics if an infection is present. °· You may be advised to change lifestyle habits. This includes stopping smoking and avoiding alcohol, caffeine, and chocolate. °· You may be advised to keep your head raised (elevated) when sleeping. This reduces the chance of acid going backward from your stomach into your esophagus. °Most of the time, nonspecific chest pain will improve within 2-3 days with rest and mild pain medicine.  °HOME CARE INSTRUCTIONS  °· If antibiotics were prescribed, take them as directed. Finish them even if you start to feel better. °· For the next few days, avoid physical activities that bring on chest pain. Continue physical activities as directed. °· Do not use any tobacco products, including cigarettes, chewing tobacco, or electronic cigarettes. °· Avoid drinking alcohol. °· Only take medicine as directed by your health care provider. °· Follow your health care provider's suggestions for further testing if your chest pain does not go away. °· Keep any follow-up appointments you made. If you do not go to an appointment, you could develop lasting (chronic) problems with pain. If there is any problem keeping an appointment, call to reschedule. °SEEK MEDICAL CARE IF:  °· Your chest pain does not go away, even after treatment. °· You have a rash with blisters on your chest. °· You have a fever. °SEEK IMMEDIATE MEDICAL CARE IF:  °· You have increased chest pain or pain that spreads to your arm, neck, jaw, back, or abdomen. °· You have shortness of breath. °· You have an increasing cough, or you cough   up blood.  You have severe back or abdominal pain.  You feel nauseous or vomit.  You have severe weakness.  You faint.  You have chills. This is an emergency. Do not wait to see if the pain will go away. Get medical help at once. Call your local emergency services (911 in [place] Do not drive  yourself to the hospital. MAKE SURE YOU:   Understand these instructions.  Will watch your condition.  Will get help right away if you are not doing well or get worse. Document Released: 11/22/2004 Document Revised: 02/17/2013 Document Reviewed: 09/18/2007 [facility] Patient Information 2015 [place], [place]. This information is not intended to replace advice given to you by your health care provider. Make sure you discuss any questions you have with your health care provider.  Suicidal Feelings, How to Help Yourself Everyone feels sad or unhappy at times, but depressing thoughts and feelings of hopelessness can lead to thoughts of suicide. It can seem as if life is too tough to handle. If you feel as though you have reached the point where suicide is the only answer, it is time to let someone know immediately.  HOW TO COPE AND PREVENT SUICIDE  Let family, friends, teachers, or counselors know. Get help. Try not to isolate yourself from those who care about you. Even though you may not feel sociable, talk with someone every day. It is best if it is face-to-face. Remember, they will want to help you.  Eat a regularly spaced and well-balanced diet.  Get plenty of rest.  Avoid alcohol and drugs because they will only make you feel worse and may also lower your inhibitions. Remove them from the home. If you are thinking of taking an overdose of your prescribed medicines, give your medicines to someone who can give them to you one day at a time. If you are on antidepressants, let your caregiver know of your feelings so he or she can provide a safer medicine, if that is a concern.  Remove weapons or poisons from your home.  Try to stick to routines. Follow a schedule and remind yourself that you have to keep that schedule every day.  Set some realistic goals and achieve them. Make a list and cross things off as you go. Accomplishments give a sense of worth. Wait until you are feeling better before  doing things you find difficult or unpleasant to do.  If you are able, try to start exercising. Even half-hour periods of exercise each day will make you feel better. Getting out in the sun or into nature helps you recover from depression faster. If you have a favorite place to walk, take advantage of that.  Increase safe activities that have always given you pleasure. This may include playing your favorite music, reading a good book, painting a picture, or playing your favorite instrument. Do whatever takes your mind off your depression.  Keep your living space well-lighted. GET HELP Contact a suicide hotline, crisis center, or local suicide prevention center for help right away. Local centers may include a hospital, clinic, community service organization, social service provider, or health department.  Call your local emergency services (911 in the [place]).  Call a suicide hotline:  1-800-273-TALK (505-467-1413) in the [place].  1-800-SUICIDE 929-015-9456) in the [place].  939-593-3901 in the [place] for Spanish-speaking counselors.  4-696-295-2WUX 870-416-0291) in the [place] for TTY users.  Visit the following websites for information and help:  National Suicide Prevention Lifeline: www.suicidepreventionlifeline.org  Hopeline: www.hopeline.com  [employer] for Suicide Prevention: https://www.ayers.com/www.afsp.org  For lesbian, gay, bisexual, transgender, or questioning youth, contact [employer]:  1-610-9-U-EAVWUJ1-866-4-U-TREVOR 805-058-3536(1-267-628-0207) in the [place].  www.thetrevorproject.org  In [place], treatment resources are listed in each province with listings available under [employer] or similar titles. Another source for Crisis Centres by [place] is located at http://www.suicideprevention.ca/in-crisis-now/find-a-crisis-centre-now/crisis-centres Document Released: 08/19/2002 Document Revised: 05/07/2011 Document Reviewed:  06/09/2013 [facility] Patient Information 2015 [place], [place]. This information is not intended to replace advice given to you by your health care provider. Make sure you discuss any questions you have with your health care provider.

## 2014-07-19 NOTE — Discharge Instructions (Signed)
Your tests are normal, no heart attack, pneumonia, blood clots. Keep your appointment later today with your primary care doctor.

## 2014-07-19 NOTE — ED Notes (Signed)
[facility] Pt updated on plan.

## 2014-07-19 NOTE — Progress Notes (Signed)
MRN: [id number] Name: [name]  Sex: male Age: 24 y.o. DOB: [birth year]  Allergies: Carbamazepine; Geodon; Haldol; Strattera; Other; Shellfish allergy; and Food  Chief Complaint  Patient presents with  . Follow-up    HPI: Patient is 24 y.o. male who history of asthma, mood disorder, recently had been to ER with the symptoms of chest pain, EMR reviewed  patient had troponin checked EKG without acute findings, patient currently denies any headache dizziness chest and shortness of breath, patient is also scheduled to follow with his psychiatrist currently on Seroquel as well as Abilify, currently denies any SI or HI. Apparently patient has not been using the Flovent everyday, have counseled patient to use Flovent twice, albuterol when necessary. Patient also has history of vitamin D deficiency will start taking vitamin D 50,000 units once a week.patient does not smoke cigarettes, does drink alcohol on occasions.  Past Medical History  Diagnosis Date  . Asthma   . Bipolar 1 disorder   . ODD (oppositional defiant disorder)   . ADHD (attention deficit hyperactivity disorder)   . Schizophrenia   . Suicidal ideation   . Homicidal ideation   . Explosive personality disorder     History reviewed. No pertinent past surgical history.    Medication List       This list is accurate as of: 07/19/14  3:11 PM.  Always use your most recent med list.               albuterol 108 (90 BASE) MCG/ACT inhaler  Commonly known as:  PROVENTIL HFA;VENTOLIN HFA  Inhale 1 puff into the lungs every 6 (six) hours as needed for wheezing or shortness of breath.     ARIPiprazole 400 MG Susr  Inject 400 mg into the muscle every 30 (thirty) days.     BENADRYL PO  Take 1 tablet by mouth daily as needed (sleep,allergies).     EPIPEN 2-PAK IJ  Inject 1 each as directed as needed (anaphylaxis).     fluticasone 110 MCG/ACT inhaler  Commonly known as:  FLOVENT HFA  Inhale 2 puffs into the lungs  every 12 (twelve) hours.     QUEtiapine 100 MG tablet  Commonly known as:  SEROQUEL  Take 100 mg by mouth 2 (two) times daily.     Vitamin D (Ergocalciferol) 50000 UNITS Caps capsule  Commonly known as:  DRISDOL  Take 1 capsule (50,000 Units total) by mouth every 7 (seven) days.        No orders of the defined types were placed in this encounter.    Immunization History  Administered Date(s) Administered  . PPD Test 06/03/2014  . Tdap 07/16/2014    Family History  Problem Relation Age of Onset  . Asthma Mother   . Asthma Sister   . Thyroid disease      History  Substance Use Topics  . Smoking status: Never Smoker   . Smokeless tobacco: Not on file  . Alcohol Use: 0.0 oz/week    0 Standard drinks or equivalent per week     Comment: pt reported has not drank since last week    Review of Systems   As noted in HPI  Filed Vitals:   07/19/14 1444  BP: 118/82  Pulse: 64  Temp: 98 F (36.7 C)  Resp: 16    Physical Exam  Physical Exam  Constitutional: No distress.  Eyes: EOM are normal. Pupils are equal, round, and reactive to light.  Cardiovascular: Normal rate and  regular rhythm.   Pulmonary/Chest: Breath sounds normal. No respiratory distress. He has no wheezes. He has no rales.  Abdominal: Soft. He exhibits no distension. There is no tenderness. There is no rebound.  Musculoskeletal: He exhibits no edema.    CBC    Component Value Date/Time   WBC 6.7 07/19/2014 0329   RBC 5.55 07/19/2014 0329   HGB 15.1 07/19/2014 0329   HCT 43.8 07/19/2014 0329   PLT 301 07/19/2014 0329   MCV 78.9 07/19/2014 0329   LYMPHSABS 1.5 05/25/2014 1316   MONOABS 0.4 05/25/2014 1316   EOSABS 0.2 05/25/2014 1316   BASOSABS 0.0 05/25/2014 1316    CMP     Component Value Date/Time   NA 137 07/19/2014 0329   K 4.2 07/19/2014 0329   CL 104 07/19/2014 0329   CO2 25 07/19/2014 0329   GLUCOSE 95 07/19/2014 0329   BUN 9 07/19/2014 0329   CREATININE 1.06 07/19/2014 0329    CREATININE 1.03 03/25/2014 1052   CALCIUM 9.4 07/19/2014 0329   PROT 7.7 07/18/2014 2248   ALBUMIN 4.1 07/18/2014 2248   AST 31 07/18/2014 2248   ALT 39 07/18/2014 2248   ALKPHOS 73 07/18/2014 2248   BILITOT 0.3 07/18/2014 2248   GFRNONAA >60 07/19/2014 0329   GFRNONAA >89 03/25/2014 1052   GFRAA >60 07/19/2014 0329   GFRAA >89 03/25/2014 1052    No results found for: CHOL  Lab Results  Component Value Date/Time   HGBA1C 5.4 03/25/2014 10:52 AM    Lab Results  Component Value Date/Time   AST 31 07/18/2014 10:48 PM    Assessment and Plan  Asthma, chronic, unspecified asthma severity, uncomplicated Continue with Flovent, albuterol when necessary  Atypical chest pain Workup has been negative.  Mood disorder Currently on Abilify and Seroquel scheduled to follow with his psychiatrist  Vitamin D deficiency Vitamin D 50,000 units once a week.  Patient had several ER visits, I have counseled patient to call his psychiatrist or our office if he has any question or concerns before he goes to the emergency room.  Return in about 3 months (around 10/19/2014), or if symptoms worsen or fail to improve, for asthma.   This note has been created with Education officer, environmental. Any transcriptional errors are unintentional.    Doris Cheadle, MD

## 2014-07-19 NOTE — ED Notes (Signed)
Sandwich and sprite given. 

## 2014-07-19 NOTE — ED Provider Notes (Signed)
CSN: [id number]     Arrival date & time 07/19/14  1958 History  This chart was scribed for Wynetta EmeryNicole Sharman Garrott, PA, working with Elwin MochaBlair Walden, MD by Lyndel SafeKaitlyn Shelton, ED Scribe. This paitent was seen in room C22C/C22C and the patient's care was started at 10:13 PM.     Chief Complaint  Patient presents with  . Hallucinations  . Chest Pain  . Suicidal   The history is provided by the patient and medical records. No language interpreter was used.    HPI Comments: [name] is a 24 y.o. male, with a prior medial history of bipolar disorder, ODD, ADHD, schizophrenia, SI/HI, and explosive personality disorder, brought in by ambulance, who presents to the Emergency Department complaining of command auditory/visual hallucinations and central chest pain onset today. He reports that his hallucinations, [name] and Dorathy DaftKayla, tell him 'they want to run in traffic together'. Pt reports he has suicidal ideation currently and has tried to commit suicide in the past by cutting himself.  He is currently prescribed psychiatric medication that he is compliant with. He has an ACT Team who he says advised him to call EMS after he spoke with them tonight. Pt admits he is currently a smoker and occasionally consumes alcohol but denies any illegal drug abuse.   Pt was last seen in the ED this morning, 5/23 at 5 am and diagnosed with atypical chest pain. He had a follow up appointment for this visit with Dr. Orpah CobbAdvani, MD.    Past Medical History  Diagnosis Date  . Asthma   . Bipolar 1 disorder   . ODD (oppositional defiant disorder)   . ADHD (attention deficit hyperactivity disorder)   . Schizophrenia   . Suicidal ideation   . Homicidal ideation   . Explosive personality disorder    History reviewed. No pertinent past surgical history. Family History  Problem Relation Age of Onset  . Asthma Mother   . Asthma Sister   . Thyroid disease     History  Substance Use Topics  . Smoking status: Never Smoker   .  Smokeless tobacco: Not on file  . Alcohol Use: 0.0 oz/week    0 Standard drinks or equivalent per week     Comment: pt reported has not drank since last week    Review of Systems  A complete 10 system review of systems was obtained and is otherwise negative except at noted in the HPI and PMH.  Allergies  Carbamazepine; Geodon; Haldol; Strattera; Other; Shellfish allergy; and Food  Home Medications   Prior to Admission medications   Medication Sig Start Date End Date Taking? Authorizing Provider  albuterol (PROVENTIL HFA;VENTOLIN HFA) 108 (90 BASE) MCG/ACT inhaler Inhale 1 puff into the lungs every 6 (six) hours as needed for wheezing or shortness of breath. 03/25/14   Doris Cheadleeepak Advani, MD  ARIPiprazole 400 MG SUSR Inject 400 mg into the muscle every 30 (thirty) days.    Historical Provider, MD  DiphenhydrAMINE HCl (BENADRYL PO) Take 1 tablet by mouth daily as needed (sleep,allergies).     Historical Provider, MD  EPINEPHrine (EPIPEN 2-PAK IJ) Inject 1 each as directed as needed (anaphylaxis).     Historical Provider, MD  fluticasone (FLOVENT HFA) 110 MCG/ACT inhaler Inhale 2 puffs into the lungs every 12 (twelve) hours. 03/25/14   Doris Cheadleeepak Advani, MD  QUEtiapine (SEROQUEL) 100 MG tablet Take 100 mg by mouth 2 (two) times daily.     Historical Provider, MD  Vitamin D, Ergocalciferol, (DRISDOL) 50000 UNITS CAPS  capsule Take 1 capsule (50,000 Units total) by mouth every 7 (seven) days. Patient not taking: Reported on 05/18/2014 03/30/14   Doris Cheadle, MD   BP 128/64 mmHg  Pulse 92  Temp(Src) 98.6 F (37 C) (Oral)  Resp 14  SpO2 97%  Physical Exam  Constitutional: He appears well-developed and well-nourished.  HENT:  Head: Normocephalic and atraumatic.  Mouth/Throat: Oropharynx is clear and moist.  Eyes: Conjunctivae are normal. Pupils are equal, round, and reactive to light. Right eye exhibits no discharge. Left eye exhibits no discharge.  Neck: Normal range of motion.  Cardiovascular:  Normal rate, regular rhythm and intact distal pulses.   Pulmonary/Chest: Effort normal and breath sounds normal. No respiratory distress. He has no wheezes. He has no rales. He exhibits no tenderness.  Abdominal: Soft. Bowel sounds are normal. He exhibits no distension. There is no tenderness. There is no rebound and no guarding.  Neurological: He is alert. Coordination normal.  Skin: Skin is warm and dry. No rash noted. He is not diaphoretic. No erythema.  Psychiatric: His affect is blunt. He expresses suicidal ideation. He expresses no homicidal ideation. He expresses suicidal plans. He expresses no homicidal plans.  fLat affect, partial-thickness lacerations to volar right forearm. No warmth, discharge or surrounding cellulitis.  Nursing note and vitals reviewed.   ED Course  Procedures  DIAGNOSTIC STUDIES: Oxygen Saturation is 97% on RA, normal by my interpretation.    COORDINATION OF CARE: 10:19 PM Discussed treatment plan to give tylenol for chest wall pain. Pt agreed to treatment plan.    Labs Review Labs Reviewed  BASIC METABOLIC PANEL - Abnormal; Notable for the following:    Glucose, Bld 108 (*)    All other components within normal limits  CBC  ETHANOL  URINE RAPID DRUG SCREEN (HOSP PERFORMED)  Rosezena Sensor, ED    Imaging Review Dg Chest 2 View  07/19/2014   CLINICAL DATA:  Altered mental status.  Chest pain.  EXAM: CHEST  2 VIEW  COMPARISON:  None.  FINDINGS: The heart size and mediastinal contours are within normal limits. Both lungs are clear. No pleural effusion or pneumothorax. Mild dextroscoliosis of the thoracic spine.  IMPRESSION: No active cardiopulmonary disease.   Electronically Signed   By: Amie Portland M.D.   On: 07/19/2014 20:56   Dg Chest 2 View  07/19/2014   CLINICAL DATA:  Left-sided chest pain for several weeks.  EXAM: CHEST  2 VIEW  COMPARISON:  None.  FINDINGS: Examination is degraded due to patient body habitus.  Normal cardiac silhouette and  mediastinal contours. Evaluation the retrosternal clear space obscured secondary to overlying soft tissues. No focal parenchymal opacities. No pleural effusion or pneumothorax. No evidence of edema. No acute osseus abnormalities.  IMPRESSION: No acute cardiopulmonary disease.   Electronically Signed   By: Simonne Come M.D.   On: 07/19/2014 00:22     EKG Interpretation None      MDM   Final diagnoses:  Chest pain  Hallucinations    Filed Vitals:   07/19/14 2014  BP: 128/64  Pulse: 92  Temp: 98.6 F (37 C)  TempSrc: Oral  Resp: 14  SpO2: 97%    Medications  albuterol (PROVENTIL HFA;VENTOLIN HFA) 108 (90 BASE) MCG/ACT inhaler 1 puff (not administered)  fluticasone (FLOVENT HFA) 110 MCG/ACT inhaler 2 puff (not administered)  QUEtiapine (SEROQUEL) tablet 100 mg (not administered)  alum & mag hydroxide-simeth (MAALOX/MYLANTA) 200-200-20 MG/5ML suspension 30 mL (not administered)  ondansetron (ZOFRAN) tablet 4 mg (  not administered)  nicotine (NICODERM CQ - dosed in mg/24 hours) patch 21 mg (not administered)  zolpidem (AMBIEN) tablet 5 mg (not administered)  ibuprofen (ADVIL,MOTRIN) tablet 600 mg (not administered)  acetaminophen (TYLENOL) tablet 650 mg (not administered)    Shandy Woodyard is a pleasant 24 y.o. male presenting with auditory  Command hallucinations, nstructing him to run into traffic.  Patient is a frequent visitor to the ED, I spoke into his team coordinator Heather at [phone].  she states that she instructed the patient to return to the ED for evaluation of his chest pain.   Patient is medically cleared for psychiatric evaluation will be transferred to the psych ED. TTS consulted, home meds and psych standard holding orders placed.   I personally performed the services described in this documentation, which was scribed in my presence. The recorded information has been reviewed and is accurate.        Wynetta Emery, PA-C 07/19/14 2324  Patient  is now voicing homicidal ideation: States that he plans to kill the doctors were not taking him seriously. Patient has been evaluated by Berna Spare and states that he meets criteria for permanent inpatient placement at [facility]. Patient may need to be involuntarily committed if he needs to voice homicidal ideation.   Wynetta Emery, PA-C 07/19/14 2347  Elwin Mocha, MD 07/19/14 [phone]

## 2014-07-20 ENCOUNTER — Emergency Department (HOSPITAL_COMMUNITY): Payer: Medicaid Other

## 2014-07-20 ENCOUNTER — Emergency Department (EMERGENCY_DEPARTMENT_HOSPITAL)
Admission: EM | Admit: 2014-07-20 | Discharge: 2014-07-21 | Disposition: A | Discharge status: Home / Self Care | Payer: Medicaid Other | Source: Home / Self Care | Attending: Emergency Medicine | Admitting: Emergency Medicine

## 2014-07-20 ENCOUNTER — Encounter (HOSPITAL_COMMUNITY): Payer: Self-pay

## 2014-07-20 DIAGNOSIS — F250 Schizoaffective disorder, bipolar type: Secondary | ICD-10-CM | POA: Diagnosis not present

## 2014-07-20 DIAGNOSIS — R45851 Suicidal ideations: Secondary | ICD-10-CM

## 2014-07-20 DIAGNOSIS — J45909 Unspecified asthma, uncomplicated: Secondary | ICD-10-CM | POA: Insufficient documentation

## 2014-07-20 DIAGNOSIS — Z79899 Other long term (current) drug therapy: Secondary | ICD-10-CM

## 2014-07-20 DIAGNOSIS — F319 Bipolar disorder, unspecified: Secondary | ICD-10-CM | POA: Insufficient documentation

## 2014-07-20 LAB — COMPREHENSIVE METABOLIC PANEL
ALK PHOS: 82 U/L (ref 38–126)
ALT: 37 U/L (ref 17–63)
AST: 29 U/L (ref 15–41)
Albumin: 4.7 g/dL (ref 3.5–5.0)
Anion gap: 10 (ref 5–15)
BILIRUBIN TOTAL: 0.5 mg/dL (ref 0.3–1.2)
BUN: 10 mg/dL (ref 6–20)
CO2: 27 mmol/L (ref 22–32)
CREATININE: 1.07 mg/dL (ref 0.61–1.24)
Calcium: 9.3 mg/dL (ref 8.9–10.3)
Chloride: 104 mmol/L (ref 101–111)
GFR calc Af Amer: >60 mL/min (ref >60)
GFR calc non Af Amer: >60 mL/min (ref >60)
GLUCOSE: 99 mg/dL (ref 65–99)
Potassium: 4 mmol/L (ref 3.5–5.1)
Sodium: 141 mmol/L (ref 135–145)
Total Protein: 8.4 g/dL — ABNORMAL HIGH (ref 6.5–8.1)

## 2014-07-20 LAB — CBC WITH DIFFERENTIAL/PLATELET
Basophils Absolute: 0 10*3/uL (ref 0.0–0.1)
Basophils Relative: 0 % (ref 0–1)
EOS PCT: 2 % (ref 0–5)
Eosinophils Absolute: 0.1 10*3/uL (ref 0.0–0.7)
HCT: 44.3 % (ref 39.0–52.0)
Hemoglobin: 15 g/dL (ref 13.0–17.0)
Lymphocytes Relative: 31 % (ref 12–46)
Lymphs Abs: 1.7 10*3/uL (ref 0.7–4.0)
MCH: 27.3 pg (ref 26.0–34.0)
MCHC: 33.9 g/dL (ref 30.0–36.0)
MCV: 80.7 fL (ref 78.0–100.0)
MONOS PCT: 9 % (ref 3–12)
Monocytes Absolute: 0.5 10*3/uL (ref 0.1–1.0)
NEUTROS PCT: 58 % (ref 43–77)
Neutro Abs: 3.1 10*3/uL (ref 1.7–7.7)
Platelets: 329 10*3/uL (ref 150–400)
RBC: 5.49 MIL/uL (ref 4.22–5.81)
RDW: 13.6 % (ref 11.5–15.5)
WBC: 5.4 10*3/uL (ref 4.0–10.5)

## 2014-07-20 LAB — I-STAT TROPONIN, ED: Troponin i, poc: 0 ng/mL (ref 0.00–0.08)

## 2014-07-20 LAB — ETHANOL: Alcohol, Ethyl (B): <5 mg/dL (ref <5)

## 2014-07-20 MED ORDER — QUETIAPINE FUMARATE 100 MG PO TABS
100.0000 mg | ORAL_TABLET | Freq: Two times a day (BID) | ORAL | Status: DC
  Administered 2014-07-20 – 2014-07-21 (×2): 100 mg via ORAL
  Filled 2014-07-20 (×2): qty 1

## 2014-07-20 MED ORDER — FLUTICASONE PROPIONATE HFA 110 MCG/ACT IN AERO: 2.0000 | INHALATION_SPRAY | Freq: Two times a day (BID) | RESPIRATORY_TRACT | Status: DC

## 2014-07-20 MED ORDER — DIPHENHYDRAMINE HCL 25 MG PO CAPS
25.0000 mg | ORAL_CAPSULE | Freq: Four times a day (QID) | ORAL | Status: DC | PRN
  Administered 2014-07-20: 25 mg via ORAL
  Filled 2014-07-20: qty 1

## 2014-07-20 MED ORDER — QUETIAPINE FUMARATE 100 MG PO TABS
100.0000 mg | ORAL_TABLET | Freq: Once | ORAL | Status: AC
  Administered 2014-07-20: 100 mg via ORAL
  Filled 2014-07-20: qty 1

## 2014-07-20 MED ORDER — BUDESONIDE 0.5 MG/2ML IN SUSP
0.5000 mg | Freq: Two times a day (BID) | RESPIRATORY_TRACT | Status: DC
  Administered 2014-07-21: 0.5 mg via RESPIRATORY_TRACT
  Filled 2014-07-20 (×6): qty 2

## 2014-07-20 MED ORDER — ALBUTEROL SULFATE HFA 108 (90 BASE) MCG/ACT IN AERS: 1.0000 | INHALATION_SPRAY | Freq: Four times a day (QID) | RESPIRATORY_TRACT | Status: DC | PRN

## 2014-07-20 NOTE — ED Notes (Signed)
Telepsych machine at bedside. [name] CapriceConrad calling to speak with patient via telepsych.

## 2014-07-20 NOTE — ED Notes (Addendum)
Spoke with ACT team, Trey PaulaJeff. Reports he will come pick pt up in 25-30 mins. Dr. Romeo AppleHarrison in room to assess patient.

## 2014-07-20 NOTE — ED Notes (Signed)
Pt awake, requesting water which was provided. Pt speaking with sitter at bedside. Pt polite, calm, and cooperative

## 2014-07-20 NOTE — Discharge Instructions (Signed)

## 2014-07-20 NOTE — BH Assessment (Signed)
[facility] per request of Donell SievertSpencer Simon, [place].   Clista BernhardtNancy Jams Trickett, [facility] T[facility] Specialist 07/20/2014 4:33 AM

## 2014-07-20 NOTE — ED Notes (Signed)
Pt c/o cp left sided, 9/10. "pulling" for 3 weeks intermittently. Pt just finished his tele-psych after he reported the pain started. Also, just finished eating. Dr. Romeo AppleHarrison made aware.

## 2014-07-20 NOTE — ED Notes (Signed)
Pt called EMS for shortness of breath and then tells EMS he was suicidal and going to jump out of ambulance

## 2014-07-20 NOTE — ED Provider Notes (Signed)
CSN: [id number]     Arrival date & time 07/20/14  2002 History   First MD Initiated Contact with Patient 07/20/14 2007     Chief Complaint  Patient presents with  . Suicidal     (Consider location/radiation/quality/duration/timing/severity/associated sxs/prior Treatment) Patient is a 24 y.o. male presenting with mental health disorder. The history is provided by the patient. No language interpreter was used.  Mental Health Problem Presenting symptoms: suicidal thoughts and suicidal threats   Presenting symptoms: no agitation   Degree of incapacity (severity):  Unable to specify Onset quality:  Unable to specify Duration:  1 day Timing:  Constant Progression:  Unchanged Chronicity:  Chronic Treatment compliance:  Most of the time Relieved by:  Nothing Worsened by:  Nothing tried Associated symptoms: chest pain   Associated symptoms: no abdominal pain, no appetite change, no fatigue and no headaches   Chest pain:    Quality:  Dull   Severity:  Moderate   Duration:  3 weeks   Timing:  Constant   Progression:  Unchanged   Chronicity:  Recurrent Risk factors: hx of mental illness and recent psychiatric admission     Past Medical History  Diagnosis Date  . Asthma   . Bipolar 1 disorder   . ODD (oppositional defiant disorder)   . ADHD (attention deficit hyperactivity disorder)   . Schizophrenia   . Suicidal ideation   . Homicidal ideation   . Explosive personality disorder    History reviewed. No pertinent past surgical history. Family History  Problem Relation Age of Onset  . Asthma Mother   . Asthma Sister   . Thyroid disease     History  Substance Use Topics  . Smoking status: Never Smoker   . Smokeless tobacco: Not on file  . Alcohol Use: 0.0 oz/week    0 Standard drinks or equivalent per week     Comment: pt reported has not drank since last week    Review of Systems  Constitutional: Negative for fever, activity change, appetite change and fatigue.  HENT:  Negative for congestion, facial swelling, rhinorrhea and trouble swallowing.   Eyes: Negative for photophobia and pain.  Respiratory: Negative for cough, chest tightness and shortness of breath.   Cardiovascular: Positive for chest pain. Negative for leg swelling.  Gastrointestinal: Negative for nausea, vomiting, abdominal pain, diarrhea and constipation.  Endocrine: Negative for polydipsia and polyuria.  Genitourinary: Negative for dysuria, urgency, decreased urine volume and difficulty urinating.  Musculoskeletal: Negative for back pain and gait problem.  Skin: Negative for color change, rash and wound.  Allergic/Immunologic: Negative for immunocompromised state.  Neurological: Negative for dizziness, facial asymmetry, speech difficulty, weakness, numbness and headaches.  Psychiatric/Behavioral: Positive for suicidal ideas. Negative for confusion, decreased concentration and agitation.      Allergies  Carbamazepine; Geodon; Haldol; Strattera; Other; Shellfish allergy; and Food  Home Medications   Prior to Admission medications   Medication Sig Start Date End Date Taking? Authorizing Provider  ARIPiprazole 400 MG SUSR Inject 400 mg into the muscle every 30 (thirty) days.   Yes Historical Provider, MD  fluticasone (FLOVENT HFA) 110 MCG/ACT inhaler Inhale 2 puffs into the lungs every 12 (twelve) hours. 03/25/14  Yes Doris Cheadleeepak Advani, MD  QUEtiapine (SEROQUEL) 100 MG tablet Take 100 mg by mouth 2 (two) times daily.    Yes Historical Provider, MD  albuterol (PROVENTIL HFA;VENTOLIN HFA) 108 (90 BASE) MCG/ACT inhaler Inhale 1 puff into the lungs every 6 (six) hours as needed for wheezing or shortness  of breath. 03/25/14   Doris Cheadle, MD  DiphenhydrAMINE HCl (BENADRYL PO) Take 1 tablet by mouth daily as needed (sleep,allergies).     Historical Provider, MD  EPINEPHrine (EPIPEN 2-PAK IJ) Inject 1 each as directed as needed (anaphylaxis).     Historical Provider, MD  Vitamin D, Ergocalciferol,  (DRISDOL) 50000 UNITS CAPS capsule Take 1 capsule (50,000 Units total) by mouth every 7 (seven) days. Patient not taking: Reported on 05/18/2014 03/30/14   Doris Cheadle, MD   BP 112/65 mmHg  Pulse 77  Temp(Src) 97.6 F (36.4 C) (Oral)  Resp 20  SpO2 98% Physical Exam  Constitutional: He is oriented to person, place, and time. He appears well-developed and well-nourished. No distress.  HENT:  Head: Normocephalic and atraumatic.  Mouth/Throat: No oropharyngeal exudate.  Eyes: Pupils are equal, round, and reactive to light.  Neck: Normal range of motion. Neck supple.  Cardiovascular: Normal rate, regular rhythm and normal heart sounds.  Exam reveals no gallop and no friction rub.   No murmur heard. Pulmonary/Chest: Effort normal and breath sounds normal. No respiratory distress. He has no wheezes. He has no rales.  Abdominal: Soft. Bowel sounds are normal. He exhibits no distension and no mass. There is no tenderness. There is no rebound and no guarding.  Musculoskeletal: Normal range of motion. He exhibits no edema or tenderness.  Neurological: He is alert and oriented to person, place, and time.  Skin: Skin is warm and dry.  Psychiatric: He has a normal mood and affect. He expresses suicidal ideation. He expresses suicidal plans.  States he is having chronic hallucinations, does not appear to be responding to external stimuli    ED Course  Procedures (including critical care time) Labs Review Labs Reviewed  COMPREHENSIVE METABOLIC PANEL - Abnormal; Notable for the following:    Total Protein 8.4 (*)    All other components within normal limits  CBC WITH DIFFERENTIAL/PLATELET  ETHANOL  URINE RAPID DRUG SCREEN (HOSP PERFORMED)  Rosezena Sensor, ED    Imaging Review Dg Chest 2 View  07/20/2014   CLINICAL DATA:  Chest pain, suicidal  EXAM: CHEST  2 VIEW  COMPARISON:  04/20/2014  FINDINGS: Cardiomediastinal silhouette is stable. No acute infiltrate or pleural effusion. No pulmonary  edema. Bony thorax is unremarkable.  IMPRESSION: No active cardiopulmonary disease.   Electronically Signed   By: Natasha Mead M.D.   On: 07/20/2014 21:15   Dg Chest 2 View  07/19/2014   CLINICAL DATA:  Altered mental status.  Chest pain.  EXAM: CHEST  2 VIEW  COMPARISON:  None.  FINDINGS: The heart size and mediastinal contours are within normal limits. Both lungs are clear. No pleural effusion or pneumothorax. Mild dextroscoliosis of the thoracic spine.  IMPRESSION: No active cardiopulmonary disease.   Electronically Signed   By: Amie Portland M.D.   On: 07/19/2014 20:56     EKG Interpretation None      MDM   Final diagnoses:  Schizoaffective disorder, bipolar type  Suicidal ideations    Pt is a 24 y.o. male with Pmhx as above who presents with SI, had had 19 visits in past 6 months, presents frequently for suicidal ideations, chronic hallucinations or overdoses, as well as for malingering 1.  Patient was discharged from the ED this morning having been seen for hallucinations, chest pain and suicidal ideation.  He had a benign workup and is low risk for MACE per HEART pathway.  Repat trop and CXR nml. On physical exam, Patient  is in no acute distress.  He has hand sanitizer in his mouth as I walk in the room.   He does not appear to be responding to external stimuli, though tells me that he does see to people in the room with [place], which are chronic hallucinations for him since the age of 48.  I have left a message with Herbert Seta, through his ACT team, TTS will be consulted.   Pt has taken a staple out of a magazine and scraped his arm. Plan for psych to eval in the morning, IVC filed.    Toy Cookey, MD 07/21/14 1027

## 2014-07-20 NOTE — ED Provider Notes (Signed)
10:31 AM psych clearing patient for discharge. We have contacted his act team. He continues to complain of chest pain which she states is been constant for 2-3 weeks. Workup reviewed by me which noncontributory. I reexamined the patient and he continues to appear well. I repeated an EKG which is noncontributory. Chest pain is atypical. Low risk for MACE per HEART pathway. Plan for pcp f/u. Return precautions provided.  Clinical Impression 1. Hallucinations   2. Chest pain   3. Suicidal ideation   4. Atypical chest pain      Purvis SheffieldForrest Louiza Moor, MD 07/20/14 1032

## 2014-07-20 NOTE — Progress Notes (Signed)
LCSW aware of patient and very familiar with patient's case. Patient has an ACT team: [facility] Trey Paula(Jeff) who he has been with long term.  Patient lives with his mother. Trey PaulaJeff was contacted and alerted patient is here in hospital. [facility] however full referral has not been submitted. Only information sent were clinicals. No current authorization for [facility].  Patient being re-assessed. LCSW to follow up with disposition.  Deretha EmoryHannah Kriston Mckinnie LCSW, MSW Clinical Social Work: Emergency Room [phone]

## 2014-07-20 NOTE — ED Notes (Signed)
Trey PaulaJeff, ACT team here to pick patient up. All belongings returned to patient.

## 2014-07-20 NOTE — ED Notes (Signed)
Spoke with [facility], who reports pt can be discharged and to contact ACT team who can come pick him up. Dr. Tobie PoetWithrow advising d/c. ACT # [phone]; [phone]; [phone].

## 2014-07-20 NOTE — ED Notes (Signed)
The patient is resting with sitter at the bedside. 

## 2014-07-20 NOTE — ED Notes (Signed)
Patient asked to use the phone but whoever he called did not answer.  The patient is calm with his sitter at the bedside.  I advised him that since no one answered he still has his two phone calls.

## 2014-07-20 NOTE — Progress Notes (Signed)
Spoke with Claudette Headonrad Withrow, DNP, who has evaluated pt and recommends he be d.c to follow up with his ACTT.  CSW called [facility] and spoke with representative who states Trey PaulaJeff, team lead, will come to ED to transport pt home. Provided [facility] RN with ACTT contact numbers to facilitate pick up.  Ilean SkillMeghan Bhavya Grand, MSW, LCSWA Clinical Social Work, Disposition  07/20/2014 [phone]

## 2014-07-20 NOTE — ED Notes (Signed)
Pt presents with complaint of SI, plan to run into traffic, hearing voices telling him to kill himself.  Pt paranoid,stating people are tapping into his brain waves.  Denies HI.  Denies drug or alcohol use.  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  Pt reports if he is discharged tomorrow, [facility] will [facility].  Monitoring for safety, Q 15 min checks in effect.

## 2014-07-20 NOTE — ED Notes (Addendum)
Pt with self inflicted scratch marks to Left forearm. Site cleaned with saline, Dr Micheline Mazeocherty notified. PA Spencer notified.
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
## 2014-07-20 NOTE — ED Notes (Signed)
Pt sleeping and sitter at bedside  

## 2014-07-21 DIAGNOSIS — F250 Schizoaffective disorder, bipolar type: Secondary | ICD-10-CM

## 2014-07-21 DIAGNOSIS — R45851 Suicidal ideations: Secondary | ICD-10-CM

## 2014-07-21 NOTE — [facility] Suicide Risk Assessment (Signed)
Suicide Risk Assessment  Discharge Assessment   [facility] Discharge Suicide Risk Assessment   Demographic Factors:  Male and Adolescent or young adult  Total Time spent with patient: 45 minutes  Musculoskeletal: Strength & Muscle Tone: within normal limits Gait & Station: normal Patient leans: N/A  Psychiatric Specialty Exam: Physical Exam  Review of Systems  Constitutional: Negative.   HENT: Negative.   Eyes: Negative.   Respiratory: Negative.   Cardiovascular: Negative.   Gastrointestinal: Negative.   Genitourinary: Negative.   Musculoskeletal: Negative.   Skin: Negative.   Neurological: Negative.   Endo/Heme/Allergies: Negative.   Psychiatric/Behavioral: The patient is nervous/anxious.     Blood pressure 116/83, pulse 71, temperature 97.7 F (36.5 C), temperature source Oral, resp. rate 20, SpO2 100 %.There is no weight on file to calculate BMI.  General Appearance: Casual  Eye Contact::  Good  Speech:  Normal Rate  Volume:  Normal  Mood:  Anxious  Affect:  Congruent  Thought Process:  Coherent  Orientation:  Full (Time, Place, and Person)  Thought Content:  WDL  Suicidal Thoughts:  No  Homicidal Thoughts:  No  Memory:  Immediate;   Good Recent;   Good Remote;   Good  Judgement:  Fair  Insight:  Fair  Psychomotor Activity:  Normal  Concentration:  Good  Recall:  Good  Fund of Knowledge:Good  Language: Good  Akathisia:  No  Handed:  Right  AIMS (if indicated):     Assets:  Housing Leisure Time Physical Health Resilience Social Support  ADL's:  Intact  Cognition: WNL  Sleep:         Has this patient used any form of tobacco in the last 30 days? (Cigarettes, Smokeless Tobacco, Cigars, and/or Pipes) No  Mental Status Per Nursing Assessment::   On Admission:   Suicidal ideations  Current Mental Status by Physician: NA  Loss Factors: NA  Historical Factors: NA  Risk Reduction Factors:   Sense of responsibility to family, Living with another  person, especially a relative, Positive social support and Positive therapeutic relationship  Continued Clinical Symptoms:  Anxiety, mild  Cognitive Features That Contribute To Risk:  None    Suicide Risk:  Minimal: No identifiable suicidal ideation.  Patients presenting with no risk factors but with morbid ruminations; may be classified as minimal risk based on the severity of the depressive symptoms  Principal Problem: Schizoaffective disorder, bipolar type Discharge Diagnoses:  Patient Active Problem List   Diagnosis Date Noted  . Suicidal ideation [R45.851]     Priority: High  . Suicidal ideations [R45.851]     Priority: High  . Schizoaffective disorder, bipolar type [F25.0]     Priority: High  . Drug overdose, intentional [T50.902A] 10/09/2013    Priority: High  . Aggression [F60.89] 08/22/2013    Priority: High  . Laceration of right upper arm [S41.111A]   . Suicidal thoughts [R45.851]   . Hallucinations [R44.3]   . 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 Asthma, chronic [J45.909] 03/25/2014  . Obesity, morbid [E66.01] 03/25/2014    Follow-up Information    Call [facility].   Contact information:   [facility] Actt team  Crisis Line  [phone]      Plan Of Care/Follow-up recommendations:  Activity:  as tolerated Diet:  heart healthy diet  Is patient on multiple antipsychotic therapies at discharge:  No   Has Patient had three or more failed trials of antipsychotic monotherapy by history:  No  Recommended Plan for  Multiple Antipsychotic Therapies: NA    Gordy Goar, PMH-NP 07/21/2014, 12:31 PM
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
## 2014-07-22 ENCOUNTER — Encounter (HOSPITAL_COMMUNITY): Payer: Self-pay | Admitting: Emergency Medicine

## 2014-07-22 ENCOUNTER — Emergency Department (HOSPITAL_COMMUNITY): Payer: Medicaid Other

## 2014-07-22 DIAGNOSIS — R079 Chest pain, unspecified: Secondary | ICD-10-CM | POA: Insufficient documentation

## 2014-07-22 DIAGNOSIS — Z79899 Other long term (current) drug therapy: Secondary | ICD-10-CM | POA: Diagnosis not present

## 2014-07-22 DIAGNOSIS — F319 Bipolar disorder, unspecified: Secondary | ICD-10-CM | POA: Insufficient documentation

## 2014-07-22 DIAGNOSIS — R45851 Suicidal ideations: Secondary | ICD-10-CM | POA: Diagnosis not present

## 2014-07-22 DIAGNOSIS — F209 Schizophrenia, unspecified: Secondary | ICD-10-CM | POA: Insufficient documentation

## 2014-07-22 DIAGNOSIS — J45901 Unspecified asthma with (acute) exacerbation: Secondary | ICD-10-CM | POA: Diagnosis not present

## 2014-07-22 DIAGNOSIS — F250 Schizoaffective disorder, bipolar type: Secondary | ICD-10-CM | POA: Diagnosis not present

## 2014-07-22 LAB — CBC
HCT: 43.9 % (ref 39.0–52.0)
HEMOGLOBIN: 14.9 g/dL (ref 13.0–17.0)
MCH: 27.1 pg (ref 26.0–34.0)
MCHC: 33.9 g/dL (ref 30.0–36.0)
MCV: 79.8 fL (ref 78.0–100.0)
Platelets: 323 10*3/uL (ref 150–400)
RBC: 5.5 MIL/uL (ref 4.22–5.81)
RDW: 13.6 % (ref 11.5–15.5)
WBC: 6.9 10*3/uL (ref 4.0–10.5)

## 2014-07-22 LAB — I-STAT TROPONIN, ED: Troponin i, poc: 0 ng/mL (ref 0.00–0.08)

## 2014-07-22 LAB — BASIC METABOLIC PANEL
Anion gap: 11 (ref 5–15)
BUN: 8 mg/dL (ref 6–20)
CALCIUM: 9.4 mg/dL (ref 8.9–10.3)
CO2: 26 mmol/L (ref 22–32)
Chloride: 102 mmol/L (ref 101–111)
Creatinine, Ser: 1.11 mg/dL (ref 0.61–1.24)
GFR calc non Af Amer: >60 mL/min (ref >60)
Glucose, Bld: 97 mg/dL (ref 65–99)
Potassium: 3.8 mmol/L (ref 3.5–5.1)
Sodium: 139 mmol/L (ref 135–145)

## 2014-07-22 NOTE — ED Notes (Signed)
Patient with chest pain that started about one hour ago.  Patient denies any shortness of breath.  Patient was seen three days ago for the same and was told that it was anxiety.  Patient does not believe that it is anxiety.  Patient does have nausea vomiting, vomiting 3x earlier today.  Patient is CAOX4, does have history schizophrenia.

## 2014-07-23 ENCOUNTER — Emergency Department (HOSPITAL_COMMUNITY)
Admission: EM | Admit: 2014-07-23 | Discharge: 2014-07-25 | Disposition: A | Discharge status: Home / Self Care | Payer: Medicaid Other | Attending: Emergency Medicine | Admitting: Emergency Medicine

## 2014-07-23 ENCOUNTER — Encounter (HOSPITAL_COMMUNITY): Payer: Self-pay | Admitting: *Deleted

## 2014-07-23 ENCOUNTER — Emergency Department (HOSPITAL_COMMUNITY)
Admission: EM | Admit: 2014-07-23 | Discharge: 2014-07-23 | Disposition: A | Discharge status: Home / Self Care | Payer: Medicaid Other | Attending: Emergency Medicine | Admitting: Emergency Medicine

## 2014-07-23 DIAGNOSIS — J45901 Unspecified asthma with (acute) exacerbation: Secondary | ICD-10-CM | POA: Insufficient documentation

## 2014-07-23 DIAGNOSIS — F419 Anxiety disorder, unspecified: Secondary | ICD-10-CM | POA: Diagnosis not present

## 2014-07-23 DIAGNOSIS — T1491XA Suicide attempt, initial encounter: Secondary | ICD-10-CM

## 2014-07-23 DIAGNOSIS — F209 Schizophrenia, unspecified: Secondary | ICD-10-CM | POA: Diagnosis not present

## 2014-07-23 DIAGNOSIS — R45851 Suicidal ideations: Secondary | ICD-10-CM | POA: Diagnosis present

## 2014-07-23 DIAGNOSIS — F250 Schizoaffective disorder, bipolar type: Secondary | ICD-10-CM | POA: Diagnosis present

## 2014-07-23 DIAGNOSIS — R443 Hallucinations, unspecified: Secondary | ICD-10-CM

## 2014-07-23 DIAGNOSIS — Z79899 Other long term (current) drug therapy: Secondary | ICD-10-CM | POA: Insufficient documentation

## 2014-07-23 DIAGNOSIS — F319 Bipolar disorder, unspecified: Secondary | ICD-10-CM | POA: Insufficient documentation

## 2014-07-23 DIAGNOSIS — R079 Chest pain, unspecified: Secondary | ICD-10-CM | POA: Insufficient documentation

## 2014-07-23 LAB — COMPREHENSIVE METABOLIC PANEL
ALT: 35 U/L (ref 17–63)
ANION GAP: 10 (ref 5–15)
AST: 30 U/L (ref 15–41)
Albumin: 4.5 g/dL (ref 3.5–5.0)
Alkaline Phosphatase: 81 U/L (ref 38–126)
BUN: 7 mg/dL (ref 6–20)
CO2: 25 mmol/L (ref 22–32)
Calcium: 9.5 mg/dL (ref 8.9–10.3)
Chloride: 104 mmol/L (ref 101–111)
Creatinine, Ser: 1.01 mg/dL (ref 0.61–1.24)
GFR calc Af Amer: >60 mL/min (ref >60)
GLUCOSE: 94 mg/dL (ref 65–99)
POTASSIUM: 4.2 mmol/L (ref 3.5–5.1)
Sodium: 139 mmol/L (ref 135–145)
Total Bilirubin: 0.4 mg/dL (ref 0.3–1.2)
Total Protein: 7.7 g/dL (ref 6.5–8.1)

## 2014-07-23 LAB — CBC WITH DIFFERENTIAL/PLATELET
BASOS PCT: 0 % (ref 0–1)
Basophils Absolute: 0 10*3/uL (ref 0.0–0.1)
Eosinophils Absolute: 0.1 10*3/uL (ref 0.0–0.7)
Eosinophils Relative: 2 % (ref 0–5)
HEMATOCRIT: 44.5 % (ref 39.0–52.0)
Hemoglobin: 15.2 g/dL (ref 13.0–17.0)
LYMPHS ABS: 1.9 10*3/uL (ref 0.7–4.0)
LYMPHS PCT: 28 % (ref 12–46)
MCH: 27.1 pg (ref 26.0–34.0)
MCHC: 34.2 g/dL (ref 30.0–36.0)
MCV: 79.5 fL (ref 78.0–100.0)
MONO ABS: 0.4 10*3/uL (ref 0.1–1.0)
Monocytes Relative: 6 % (ref 3–12)
NEUTROS ABS: 4.4 10*3/uL (ref 1.7–7.7)
NEUTROS PCT: 64 % (ref 43–77)
PLATELETS: 331 10*3/uL (ref 150–400)
RBC: 5.6 MIL/uL (ref 4.22–5.81)
RDW: 13.8 % (ref 11.5–15.5)
WBC: 6.9 10*3/uL (ref 4.0–10.5)

## 2014-07-23 LAB — ETHANOL

## 2014-07-23 MED ORDER — QUETIAPINE FUMARATE ER 50 MG PO TB24
100.0000 mg | ORAL_TABLET | Freq: Once | ORAL | Status: AC
  Administered 2014-07-23: 100 mg via ORAL
  Filled 2014-07-23: qty 2

## 2014-07-23 MED ORDER — ACETAMINOPHEN 325 MG PO TABS
650.0000 mg | ORAL_TABLET | Freq: Once | ORAL | Status: AC
  Administered 2014-07-23: 650 mg via ORAL
  Filled 2014-07-23: qty 2

## 2014-07-23 MED ORDER — LORAZEPAM 2 MG/ML IJ SOLN: 2.0000 mg | Freq: Once | INTRAMUSCULAR | Status: DC

## 2014-07-23 MED ORDER — LORAZEPAM 2 MG/ML IJ SOLN
2.0000 mg | Freq: Once | INTRAMUSCULAR | Status: AC
  Administered 2014-07-23: 2 mg via INTRAVENOUS
  Filled 2014-07-23: qty 1
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
## 2014-07-23 NOTE — ED Notes (Signed)
[place], [place] at bedside.  

## 2014-07-23 NOTE — Discharge Instructions (Signed)
No-harm Safety Contract  °A no-harm safety contract is a written or verbal agreement between you and a mental health professional to promote safety. It contains specific actions and promises you agree to. The agreement also includes instructions from the therapist or doctor. The instructions will help prevent you from harming yourself or harming others. Harm can be as mild as pinching yourself, but can increase in intensity to actions like burning or cutting yourself. The extreme level of self-harm would be committing suicide. No-harm safety contracts are also sometimes referred to as a no-suicide contract, suicide prevention contract, no-harm agreements or decisions, or a safety contract.  °REASONS FOR NO-HARM SAFETY CONTRACTS °Safety contracts are just one part of an overall treatment plan to help keep you safe and free of harm. A safety contract may help to relieve anxiety, restore a sense of control, state clearly the alternatives to harm or suicide, and give you and your therapist or doctor a gauge for how you are doing in between visits. °Many factors impact the decision to use a no-harm safety contract and its effectiveness. A proper overall treatment plan and evaluation and good patient understanding are the keys to good outcomes. °CONTRACT ELEMENTS  °A contract can range from simple to complex. They include all or some of the following:  °Action statements. These are statements you agree to do or not do. °Example: If I feel my life is becoming too difficult, I agree to do the following so there is no harm to myself or others: °· Talk with family or friends. °· Rid myself of all things that I could use to harm myself. °· Do an activity I enjoy or have enjoyed in the recent past. °Coping strategies. These are ways to think and feel that decrease stress, such as: °· Use of affirmations or positive statements about self. °· Good self-care, including improved grooming, and healthy eating, and healthy sleeping  patterns. °· Increase physical exercise. °· Increase social involvement. °· Focus on positive aspects of life. °Crisis management. This would include what to do if there was trouble following the contract or an urge to harm. This might include notifying family or your therapist of suicidal thoughts. Be open and honest about suicidal urges. To prevent a crisis, do the following: °· List reasons to reach out for support. °· Keep contact numbers and available hours handy. °Treatment goals. These are goals would include no suicidal thoughts, improved mood, and feelings of hopefulness. °Listed responsibilities of different people involved in care. This could include family members. A family member may agree to remove firearms or other lethal weapons/substances from your ease of access. °A timeline. A timeline can be in place from one therapy session to the next session. °HOME CARE INSTRUCTIONS  °· Follow your no-harm safety contract. °· Contact your therapist and/or doctor if you have any questions or concerns. °MAKE SURE YOU:  °· Understand these instructions. °· Will watch your condition. Noticing any mood changes or suicidal urges. °· Will get help right away if you are not doing well or get worse. °Document Released: 08/02/2009 Document Revised: 05/07/2011 Document Reviewed: 08/02/2009 °ExitCare® Patient Information ©2015 [employer]. This information is not intended to replace advice given to you by your health care provider. Make sure you discuss any questions you have with your health care provider. ° °

## 2014-07-23 NOTE — ED Notes (Signed)
Sitter notified this RN that patient is again c/o chest pain. Pt assessed. PT NAD, resp even, unlabored. Pt is tender to palpation over left chest. No sweating, nausea, diaphoresis or radiation of pain noted. Also noted by sitter to be restless, agitated and biting self. Notified ED providers Cheron SchaumannLeslie Sofia and Dr. Mora Bellmanni. Pts home antipsychotics reordered. Will administer and continue to monitor.

## 2014-07-23 NOTE — ED Provider Notes (Signed)
CSN: [id number]     Arrival date & time 07/22/14  2225 History   First MD Initiated Contact with Patient 07/23/14 0029     Chief Complaint  Patient presents with  . Chest Pain     (Consider location/radiation/quality/duration/timing/severity/associated sxs/prior Treatment) Patient is a 24 y.o. male presenting with chest pain and mental health disorder. The history is provided by the patient. No language interpreter was used.  Chest Pain Pain location:  Substernal area Pain quality: aching   Associated symptoms: shortness of breath   Associated symptoms: no abdominal pain and no fever   Associated symptoms comment:  Here with complaint of chest pain. Patient is well known to the ED for same complaint. Symptoms started 3 days ago (although evaluated for same 07/21/14) and is persistent. He complains of SOB. No vomiting or fever.  Mental Health Problem Presenting symptoms: hallucinations and suicidal thoughts   Associated symptoms: chest pain   Associated symptoms: no abdominal pain   Associated symptoms comment:  He again reports suicidal thoughts without plan for hurting himself. No homicidal thoughts. He reports hearing voices that are always there even when he takes his medications.   Past Medical History  Diagnosis Date  . Asthma   . Bipolar 1 disorder   . ODD (oppositional defiant disorder)   . ADHD (attention deficit hyperactivity disorder)   . Schizophrenia   . Suicidal ideation   . Homicidal ideation   . Explosive personality disorder    History reviewed. No pertinent past surgical history. Family History  Problem Relation Age of Onset  . Asthma Mother   . Asthma Sister   . Thyroid disease     History  Substance Use Topics  . Smoking status: Never Smoker   . Smokeless tobacco: Not on file  . Alcohol Use: 0.0 oz/week    0 Standard drinks or equivalent per week     Comment: pt reported has not drank since last week    Review of Systems  Constitutional: Negative  for fever and chills.  HENT: Negative.   Respiratory: Positive for shortness of breath.   Cardiovascular: Positive for chest pain.  Gastrointestinal: Negative.  Negative for abdominal pain.  Musculoskeletal: Negative.   Skin: Negative.   Neurological: Negative.   Psychiatric/Behavioral: Positive for suicidal ideas and hallucinations.      Allergies  Carbamazepine; Geodon; Haldol; Strattera; Other; Shellfish allergy; and Food  Home Medications   Prior to Admission medications   Medication Sig Start Date End Date Taking? Authorizing Provider  albuterol (PROVENTIL HFA;VENTOLIN HFA) 108 (90 BASE) MCG/ACT inhaler Inhale 1 puff into the lungs every 6 (six) hours as needed for wheezing or shortness of breath. 03/25/14   Doris Cheadleeepak Advani, MD  ARIPiprazole 400 MG SUSR Inject 400 mg into the muscle every 30 (thirty) days.    Historical Provider, MD  DiphenhydrAMINE HCl (BENADRYL PO) Take 1 tablet by mouth daily as needed (sleep,allergies).     Historical Provider, MD  EPINEPHrine (EPIPEN 2-PAK IJ) Inject 1 each as directed as needed (anaphylaxis).     Historical Provider, MD  fluticasone (FLOVENT HFA) 110 MCG/ACT inhaler Inhale 2 puffs into the lungs every 12 (twelve) hours. 03/25/14   Doris Cheadleeepak Advani, MD  QUEtiapine (SEROQUEL) 100 MG tablet Take 100 mg by mouth 2 (two) times daily.     Historical Provider, MD  Vitamin D, Ergocalciferol, (DRISDOL) 50000 UNITS CAPS capsule Take 1 capsule (50,000 Units total) by mouth every 7 (seven) days. Patient not taking: Reported on 05/18/2014 03/30/14  Doris Cheadle, MD   BP 161/73 mmHg  Pulse 87  Temp(Src) 99.2 F (37.3 C) (Oral)  Resp 15  SpO2 98% Physical Exam  Constitutional: He appears well-developed and well-nourished.  HENT:  Head: Normocephalic.  Neck: Normal range of motion. Neck supple.  Cardiovascular: Normal rate and regular rhythm.   Pulmonary/Chest: Effort normal and breath sounds normal.  Abdominal: Soft. Bowel sounds are normal. There is no  tenderness. There is no rebound and no guarding.  Musculoskeletal: Normal range of motion.  Neurological: He is alert. No cranial nerve deficit.  Skin: Skin is warm and dry. No rash noted.  Psychiatric: His speech is normal. He is actively hallucinating. He expresses suicidal ideation.    ED Course  Procedures (including critical care time) Labs Review Labs Reviewed  CBC  BASIC METABOLIC PANEL  I-STAT TROPOININ, ED   Results for orders placed or performed during the hospital encounter of 07/23/14  CBC  Result Value Ref Range   WBC 6.9 4.0 - 10.5 K/uL   RBC 5.50 4.22 - 5.81 MIL/uL   Hemoglobin 14.9 13.0 - 17.0 g/dL   HCT 09.8 11.9 - 14.7 %   MCV 79.8 78.0 - 100.0 fL   MCH 27.1 26.0 - 34.0 pg   MCHC 33.9 30.0 - 36.0 g/dL   RDW 82.9 56.2 - 13.0 %   Platelets 323 150 - 400 K/uL  Basic metabolic panel  Result Value Ref Range   Sodium 139 135 - 145 mmol/L   Potassium 3.8 3.5 - 5.1 mmol/L   Chloride 102 101 - 111 mmol/L   CO2 26 22 - 32 mmol/L   Glucose, Bld 97 65 - 99 mg/dL   BUN 8 6 - 20 mg/dL   Creatinine, Ser 8.65 0.61 - 1.24 mg/dL   Calcium 9.4 8.9 - 78.4 mg/dL   GFR calc non Af Amer >60 >60 mL/min   GFR calc Af Amer >60 >60 mL/min   Anion gap 11 5 - 15  I-stat troponin, ED  (not at [facility], [facility])  Result Value Ref Range   Troponin i, poc 0.00 0.00 - 0.08 ng/mL   Comment 3            Imaging Review Dg Chest 2 View  07/22/2014   CLINICAL DATA:  Shortness of breath and chest tightness for 3 weeks.  EXAM: CHEST  2 VIEW  COMPARISON:  07/20/2014; 07/18/2014  FINDINGS: Examination is degraded due to patient body habitus. Enlarged cardiac silhouette and mediastinal contours given reduced lung volumes. Evaluation the retrosternal clear space obscured secondary to overlying soft tissues. Grossly unchanged bibasilar heterogeneous opacities, left greater than right, likely atelectasis. No definite pleural effusion or pneumothorax. No evidence of edema. No acute osseous abnormalities.   IMPRESSION: Cardiomegaly and bibasilar atelectasis without acute cardiopulmonary disease.   Electronically Signed   By: Simonne Come M.D.   On: 07/22/2014 23:39     MDM   Final diagnoses:  None    1. Chest pain, chronic 2. Suicidal thoughts 3. H/o schizophrenia  The patient's lab tests, and x-ray are normal. No acute EKG changes. He is endorsing SI, hallucinations. TTS consult obtained and recommendation is to have psychiatry see him in the morning.     Elpidio Anis, PA-C 07/23/14 6962  Tomasita Crumble, MD 07/23/14 1745

## 2014-07-23 NOTE — BH Assessment (Signed)
Received notification of TTS consult request. Spoke to Catha GosselinHanna Patel-Mills, PA-C who said Pt is responding to hallucinations and threatening suicide. Tele-assessment will be initiated.  Harlin RainFord Ellis Patsy BaltimoreWarrick Jr, LPC, [facility], [facility] Triage Specialist [phone]

## 2014-07-23 NOTE — Progress Notes (Signed)
Received call from [facility] rep Cris. Pt to be provided with bass pass as needed upon d/c, and ACTT will follow up with pt today at his home. Pt to call ACTT upon arriving home. No barriers to d/c at this time.   Ilean SkillMeghan Janelle Culton, MSW, LCSWA Clinical Social Work, Disposition  07/23/2014 [phone]

## 2014-07-23 NOTE — ED Notes (Signed)
TTS in progress 

## 2014-07-23 NOTE — ED Notes (Addendum)
STAFFING CALLED ABOUT SITTER.

## 2014-07-23 NOTE — Progress Notes (Signed)
Patient to be discharged. Bus pass given for patient to transport.  LCSW to rescind the IVC for patient.  No other needs.  [facility]HT made aware of disposition.  RN also made aware.  Deretha EmoryHannah Ruthia Person LCSW, MSW Clinical Social Work: Emergency Room [phone]
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
## 2014-07-23 NOTE — ED Provider Notes (Signed)
CSN: [id number]     Arrival date & time 07/23/14  1829 History   First MD Initiated Contact with Patient 07/23/14 1917     Chief Complaint  Patient presents with  . Suicidal     (Consider location/radiation/quality/duration/timing/severity/associated sxs/prior Treatment) HPI [name] is a 24 year old male with a history of schizophrenia, suicidal ideation, homicidal ideation, bipolar 1 disorder, ODD, ADHD who presents today trying to run out into traffic [facility]. He states he was trying to kill himself when a security guard brought him into the ED. He states that he wanted his brother to record it and posted after he killed himself. He states he is hallucinating and talking to 2 people in the room one of whom is named [name] and the other name [name]. He states that he is on Seroquel and Abilify and has been taking his medications. He was just discharged from the hospital today. He has been seen 11 times this month. He denies any HI at this time. He denies any fever, chills, cough, chest pain, abdominal pain, nausea, vomiting. He denies any illicit drug use, prescription drug use, alcohol use. Past Medical History  Diagnosis Date  . Asthma   . Bipolar 1 disorder   . ODD (oppositional defiant disorder)   . ADHD (attention deficit hyperactivity disorder)   . Schizophrenia   . Suicidal ideation   . Homicidal ideation   . Explosive personality disorder    History reviewed. No pertinent past surgical history. Family History  Problem Relation Age of Onset  . Asthma Mother   . Asthma Sister   . Thyroid disease     History  Substance Use Topics  . Smoking status: Never Smoker   . Smokeless tobacco: Not on file  . Alcohol Use: 0.0 oz/week    0 Standard drinks or equivalent per week     Comment: pt reported has not drank since last week    Review of Systems  Neurological: Negative for dizziness and syncope.  Psychiatric/Behavioral: Positive for suicidal ideas,  hallucinations and self-injury.  All other systems reviewed and are negative.     Allergies  Carbamazepine; Geodon; Haldol; Strattera; Other; Shellfish allergy; and Food  Home Medications   Prior to Admission medications   Medication Sig Start Date End Date Taking? Authorizing Provider  albuterol (PROVENTIL HFA;VENTOLIN HFA) 108 (90 BASE) MCG/ACT inhaler Inhale 1 puff into the lungs every 6 (six) hours as needed for wheezing or shortness of breath. 03/25/14  Yes Doris Cheadleeepak Advani, MD  ARIPiprazole 400 MG SUSR Inject 400 mg into the muscle every 30 (thirty) days.   Yes Historical Provider, MD  DiphenhydrAMINE HCl (BENADRYL PO) Take 1 tablet by mouth daily as needed (sleep,allergies).    Yes Historical Provider, MD  EPINEPHrine (EPIPEN 2-PAK IJ) Inject 1 each as directed as needed (anaphylaxis).    Yes Historical Provider, MD  fluticasone (FLOVENT HFA) 110 MCG/ACT inhaler Inhale 2 puffs into the lungs every 12 (twelve) hours. 03/25/14  Yes Doris Cheadleeepak Advani, MD  QUEtiapine (SEROQUEL) 100 MG tablet Take 100 mg by mouth 2 (two) times daily.    Yes Historical Provider, MD  Vitamin D, Ergocalciferol, (DRISDOL) 50000 UNITS CAPS capsule Take 1 capsule (50,000 Units total) by mouth every 7 (seven) days. Patient not taking: Reported on 05/18/2014 03/30/14   Doris Cheadleeepak Advani, MD   BP 107/70 mmHg  Pulse 70  Temp(Src) 98 F (36.7 C) (Oral)  Resp 18  SpO2 97% Physical Exam  Constitutional: He is oriented to  person, place, and time. He appears well-developed and well-nourished.  HENT:  Head: Normocephalic and atraumatic.  Eyes: Conjunctivae are normal.  Neck: Neck supple.  Cardiovascular: Normal rate, regular rhythm and normal heart sounds.   Pulmonary/Chest: Effort normal and breath sounds normal.  Abdominal: Soft. There is no tenderness.  Musculoskeletal: Normal range of motion.  Ambulating back and forth in the room while speaking with him.  Neurological: He is alert and oriented to person, place, and  time.  Skin: Skin is warm and dry.  Psychiatric: His mood appears anxious. He is hyperactive.  Nursing note and vitals reviewed.   ED Course  Procedures (including critical care time) Labs Review Labs Reviewed  CBC WITH DIFFERENTIAL/PLATELET  COMPREHENSIVE METABOLIC PANEL  ETHANOL  URINE RAPID DRUG SCREEN (HOSP PERFORMED) NOT AT [facility]  Rosezena Sensor, ED    Imaging Review Dg Chest 2 View  07/22/2014   CLINICAL DATA:  Shortness of breath and chest tightness for 3 weeks.  EXAM: CHEST  2 VIEW  COMPARISON:  07/20/2014; 07/18/2014  FINDINGS: Examination is degraded due to patient body habitus. Enlarged cardiac silhouette and mediastinal contours given reduced lung volumes. Evaluation the retrosternal clear space obscured secondary to overlying soft tissues. Grossly unchanged bibasilar heterogeneous opacities, left greater than right, likely atelectasis. No definite pleural effusion or pneumothorax. No evidence of edema. No acute osseous abnormalities.  IMPRESSION: Cardiomegaly and bibasilar atelectasis without acute cardiopulmonary disease.   Electronically Signed   By: Simonne Come M.D.   On: 07/22/2014 23:39     EKG Interpretation   Date/Time:  Saturday Jul 24 2014 00:51:46 EDT Ventricular Rate:  96 PR Interval:  155 QRS Duration: 98 QT Interval:  343 QTC Calculation: 433 R Axis:   55 Text Interpretation:  Sinus rhythm no acute changes Confirmed by Rhunette Croft,  MD, Janey Genta [phone]) on 07/24/2014 2:03:00 AM      MDM   Final diagnoses:  Suicide attempt  Hallucination   Patient presents for suicide attempt by running into traffic prior to arrival. He is hallucinating on exam. He was discharged this morning from behavioral health. He has been seen 11 times this month in the ED. He has no complaints.  I have informed social work according to care plan. June social work came to see the patient and agrees that the patient needs admission since he is hallucinating at bedside. Behavioral  health spoke to the nurse practitioner who requests the patient to be IVC'd and transferred to [facility]. He is to stay in the ED until they can find him a bed, possibly in the morning. The magistrate called and spoke to Dr. Judd Lien to deny the IVC. She states that he has been IVC 4 times in the past month for 7 days each time but has been released prior to the 7 days. She refused to IVC the patient.  12:53 Patient reports that he began having chest pain a few minutes ago. He describes the pain as sharp and intermittent on the left side of his chest radiating to the right side of his chest with shortness of breath. I ordered a EKG and troponin.  14:00 I spoke to Dr. Vivia Ewing regarding this patient and his normal ekg and troponin.  The patient must be IVC'd in the morning by a different magistrate in order to be transferred to [facility] if they have a bed for him there.   Catha Gosselin, PA-C 07/24/14 1600  Geoffery Lyons, MD 07/26/14 [phone]

## 2014-07-23 NOTE — ED Notes (Signed)
Staffing notified of the need for a sitter, and is unable to send someone.

## 2014-07-23 NOTE — ED Notes (Signed)
Pt out to desk, asking for RN mutliple times, needs icepack, want wound care, only takes pills crushed, allergic to apple sauce. Pt instructed to sit down, I would be around for hourly rounding once all patients had been seen by me. Pt set up for telepsych, pt then said he wanted the door closed during the telepsych instructed that was against policy. Pt then said he wasn't going to stay, he might as well walk out into traffic. Pt then eloped out of the ED. Was brought back by security. Per Dr. Loretha StaplerWofford, patient will be IVC'd. New orders received for IM ativan.

## 2014-07-23 NOTE — Progress Notes (Signed)
[name], authorization # from [name], per clinician [name]: [id number] from 5/27 to 6/02.  [name], LCSWA Disposition staff 07/23/2014 11:07 PM

## 2014-07-23 NOTE — BH Assessment (Addendum)
Per ED notes pt arrived due to chest pain. He was seen for the same three days ago. He does not believe it is anxiety as he was told it was. Pt has been biting himself and attempting to drink had sanitizer.    Spoke with Elpidio AnisShari Upstill, PA she reports Nickolaos has expressed not wanting to live and fearful he will make it happen.   Requested cart be placed with pt for assessment.  First attempt to connect is unsuccessful will try again at 0317.  Clista BernhardtNancy Ostin Mathey, [facility] Triage Specialist 07/23/2014 3:04 AM
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
## 2014-07-23 NOTE — Progress Notes (Signed)
Spoke with Claudette Headonrad Withrow, DNP, who has evaluated pt and recommends he be discharged to follow up with his ACTT ([facility]).   CSW spoke with [facility], and rep states she is awaiting word from team lead Trey PaulaJeff as to whether he will be available to meet pt at ED for d/c. She states, in meantime, if pt is discharged, CSW let her know and ACTT will follow up with him at home. CSW waiting to hear back from [facility] as to plan.  Spoke with [facility] RN re: pt's disposition.   Ilean SkillMeghan Regene Mccarthy, MSW, LCSWA Clinical Social Work, Disposition  07/23/2014 [phone]

## 2014-07-23 NOTE — ED Notes (Addendum)
Judeth CornfieldStephanie, EMT notified this RN that the pt was biting himself. This RN spoke with the pt who states that the voices in his head tell him that if he bites himself, that they will leave him alone. This RN told the pt that he should not bite himself. Pt has a small wound to the posterior of his left hand by his thumb.

## 2014-07-23 NOTE — ED Notes (Signed)
SW states already resended IVC paperwork.

## 2014-07-23 NOTE — ED Notes (Signed)
Pt attempting to bite self and place a glove in his mouth upon arrival from previous room. Room scanned and all objects removed from room. Will continue to monitor closely.

## 2014-07-23 NOTE — ED Provider Notes (Signed)
Pt attempted to leave yelling that he was going to run out in front of traffic.  Pt encourage to return to room.   Pt given ativan 2 mg IM.   Lonia SkinnerLeslie K AdaSofia, PA-C 07/23/14 [phone]  Blake DivineJohn Wofford, MD 07/27/14 1130

## 2014-07-23 NOTE — ED Notes (Signed)
Pt states he wants to kill himself and has been having thoughts since they released him this morning. Pt states he made a video on his brothers phone with the patient stating the reasons why he killed himself and why he is upset with . Pt states his brother will post the video after he has jumped in front of a car to kim himself and if the first car doesn't kill him he will jump in front of another until he is dead. Pt states he was going to kill himself today but he had a lot of things to do and didn't have time to do it. Pt denies HI

## 2014-07-23 NOTE — ED Notes (Signed)
Pt attempting to bite himself again. This RN asked pt what she can do to help him, and he states that he needs ativan. This RN told the pt that she cannot get him ativan. Then, on the way back from the bathroom, pt took a hand full of hand sanitizer and put it in his mouth. Upstill, PA notified.

## 2014-07-24 LAB — I-STAT TROPONIN, ED: Troponin i, poc: 0 ng/mL (ref 0.00–0.08)

## 2014-07-24 MED ORDER — MUSCLE RUB 10-15 % EX CREA
TOPICAL_CREAM | CUTANEOUS | Status: DC | PRN
  Administered 2014-07-24 – 2014-07-25 (×3): via TOPICAL
  Filled 2014-07-24: qty 85

## 2014-07-24 MED ORDER — ACETAMINOPHEN 325 MG PO TABS
650.0000 mg | ORAL_TABLET | Freq: Four times a day (QID) | ORAL | Status: DC | PRN
  Administered 2014-07-24: 650 mg via ORAL
  Filled 2014-07-24: qty 2

## 2014-07-24 MED ORDER — LORAZEPAM 1 MG PO TABS
2.0000 mg | ORAL_TABLET | Freq: Once | ORAL | Status: AC
  Administered 2014-07-24: 2 mg via ORAL
  Filled 2014-07-24: qty 2

## 2014-07-24 MED ORDER — FLUTICASONE PROPIONATE HFA 110 MCG/ACT IN AERO
2.0000 | INHALATION_SPRAY | Freq: Two times a day (BID) | RESPIRATORY_TRACT | Status: DC
  Administered 2014-07-24 – 2014-07-25 (×4): 2 via RESPIRATORY_TRACT
  Filled 2014-07-24: qty 12

## 2014-07-24 MED ORDER — LORAZEPAM 2 MG/ML IJ SOLN
2.0000 mg | Freq: Once | INTRAMUSCULAR | Status: AC
  Administered 2014-07-24: 2 mg via INTRAMUSCULAR
  Filled 2014-07-24: qty 1

## 2014-07-24 MED ORDER — ALBUTEROL SULFATE HFA 108 (90 BASE) MCG/ACT IN AERS: 1.0000 | INHALATION_SPRAY | Freq: Four times a day (QID) | RESPIRATORY_TRACT | Status: DC | PRN

## 2014-07-24 MED ORDER — QUETIAPINE FUMARATE 25 MG PO TABS
100.0000 mg | ORAL_TABLET | Freq: Two times a day (BID) | ORAL | Status: DC
  Administered 2014-07-24 – 2014-07-25 (×5): 100 mg via ORAL
  Filled 2014-07-24 (×5): qty 4

## 2014-07-24 NOTE — Progress Notes (Signed)
CSW called [facility] Admissions, patient was not on the waiting list.  CSW added patient to wait list, refaxed [facility] referral.  [facility] is awaiting the IVC, paperwork in process.  Seward SpeckLeo Dezi Brauner Ward Memorial HospitalCSW,LCAS Behavioral Health Disposition CSW [phone]

## 2014-07-24 NOTE — ED Notes (Signed)
STATES EXPERIENCES "BAD DREAMS" AND FEELS SLEEPY WHEN TAKES SEROQUEL. STATES HAS NOT ADVISED HIS PSYCH D/T HE DOES NOT WANT HER TO REDUCE THE MEDICATION DOSAGE D/T STATES THEN MEDS DO NOT WORK. WHEN ASKED PT IF HE WANTED TO TAKE SEROQUEL, ADVISED YES AND

## 2014-07-24 NOTE — ED Notes (Signed)
IVC PAPERWORK WILL NEED TO BE RE-DONE PRIOR TO PT BEING TRANSPORTED TO ANY ACCEPTING LOCATION.

## 2014-07-24 NOTE — ED Notes (Signed)
PER LEO, SW, [facility], PT HAS NOT BEEN ACCEPTED YET TO [facility]. PT WAS ENTERED BACK ON WAITLIST.

## 2014-07-24 NOTE — ED Notes (Signed)
Pt has sitter at bedside, pt pulled staff distress button, this tech responded to room explained to pt that he could not be touching staff alarm pad. Pt asked who I was and I explained to pt that I was there to see what he needed by pulling distress button. Explained to pt that I would get his nurse for his request and pt then continued to walk out of room saying he was living and did not want to be here anymore. Pt was escorted back by security and nursing staff.

## 2014-07-24 NOTE — ED Notes (Signed)
Patient was given clean scrubs,comb, socks, toothpastes, toothbrush and soap.

## 2014-07-24 NOTE — ED Provider Notes (Signed)
Called to see pt due to increased agitation, pt given ativan 2mg  im  [name] NickAnthony Welby Montminy, MD 07/24/14 [id number]

## 2014-07-25 DIAGNOSIS — F250 Schizoaffective disorder, bipolar type: Secondary | ICD-10-CM

## 2014-07-25 DIAGNOSIS — T1491XA Suicide attempt, initial encounter: Secondary | ICD-10-CM | POA: Insufficient documentation

## 2014-07-25 MED ORDER — STERILE WATER FOR INJECTION IJ SOLN
2.1000 mL | Freq: Once | INTRAMUSCULAR | Status: AC
  Administered 2014-07-25: 2.1 mL via INTRAMUSCULAR

## 2014-07-25 MED ORDER — DIPHENHYDRAMINE HCL 50 MG/ML IJ SOLN
25.0000 mg | Freq: Once | INTRAMUSCULAR | Status: AC
  Administered 2014-07-25: 25 mg via INTRAMUSCULAR
  Filled 2014-07-25: qty 1

## 2014-07-25 MED ORDER — OLANZAPINE 10 MG IM SOLR
10.0000 mg | Freq: Once | INTRAMUSCULAR | Status: DC
  Filled 2014-07-25: qty 10

## 2014-07-25 MED ORDER — ALUM & MAG HYDROXIDE-SIMETH 200-200-20 MG/5ML PO SUSP
30.0000 mL | ORAL | Status: DC | PRN
  Administered 2014-07-25: 30 mL via ORAL
  Filled 2014-07-25: qty 30

## 2014-07-25 MED ORDER — DIPHENHYDRAMINE HCL 50 MG/ML IJ SOLN
25.0000 mg | Freq: Once | INTRAMUSCULAR | Status: DC
  Filled 2014-07-25: qty 1

## 2014-07-25 MED ORDER — OLANZAPINE 5 MG PO TBDP
5.0000 mg | ORAL_TABLET | Freq: Every day | ORAL | Status: DC
  Administered 2014-07-25: 5 mg via ORAL
  Filled 2014-07-25 (×3): qty 1

## 2014-07-25 MED ORDER — DIPHENHYDRAMINE HCL 25 MG PO CAPS
50.0000 mg | ORAL_CAPSULE | Freq: Once | ORAL | Status: AC
  Administered 2014-07-25: 50 mg via ORAL
  Filled 2014-07-25: qty 2

## 2014-07-25 MED ORDER — LORAZEPAM 2 MG/ML IJ SOLN
2.0000 mg | Freq: Once | INTRAMUSCULAR | Status: AC
  Administered 2014-07-25: 2 mg via INTRAMUSCULAR
  Filled 2014-07-25: qty 1

## 2014-07-25 MED ORDER — OLANZAPINE 10 MG IM SOLR
5.0000 mg | Freq: Once | INTRAMUSCULAR | Status: AC
  Administered 2014-07-25: 5 mg via INTRAMUSCULAR
  Filled 2014-07-25: qty 10

## 2014-07-25 NOTE — ED Notes (Signed)
Pt requested to speak with doctor. Doctor Blinda LeatherwoodPollina spoke with pt and agreed to D/C pt home in the care of his mother if mom and pt signs no harm contract. This RN contacted mom and she agrees to come get pt and sign for responsibility.
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
## 2014-07-25 NOTE — Discharge Instructions (Signed)
Suicidal Feelings, How to Help Yourself Everyone feels sad or unhappy at times, but depressing thoughts and feelings of hopelessness can lead to thoughts of suicide. It can seem as if life is too tough to handle. If you feel as though you have reached the point where suicide is the only answer, it is time to let someone know immediately.  HOW TO COPE AND PREVENT SUICIDE  Let family, friends, teachers, or counselors know. Get help. Try not to isolate yourself from those who care about you. Even though you may not feel sociable, talk with someone every day. It is best if it is face-to-face. Remember, they will want to help you.  Eat a regularly spaced and well-balanced diet.  Get plenty of rest.  Avoid alcohol and drugs because they will only make you feel worse and may also lower your inhibitions. Remove them from the home. If you are thinking of taking an overdose of your prescribed medicines, give your medicines to someone who can give them to you one day at a time. If you are on antidepressants, let your caregiver know of your feelings so he or she can provide a safer medicine, if that is a concern.  Remove weapons or poisons from your home.  Try to stick to routines. Follow a schedule and remind yourself that you have to keep that schedule every day.  Set some realistic goals and achieve them. Make a list and cross things off as you go. Accomplishments give a sense of worth. Wait until you are feeling better before doing things you find difficult or unpleasant to do.  If you are able, try to start exercising. Even half-hour periods of exercise each day will make you feel better. Getting out in the sun or into nature helps you recover from depression faster. If you have a favorite place to walk, take advantage of that.  Increase safe activities that have always given you pleasure. This may include playing your favorite music, reading a good book, painting a picture, or playing your favorite  instrument. Do whatever takes your mind off your depression.  Keep your living space well-lighted. GET HELP Contact a suicide hotline, crisis center, or local suicide prevention center for help right away. Local centers may include a hospital, clinic, community service organization, social service provider, or health department.  Call your local emergency services (911 in the [place]).  Call a suicide hotline:  1-800-273-TALK (212-304-23011-775-731-2305) in the [place].  1-800-SUICIDE 901-641-8141(1-501 626 9951) in the [place].  (323) [phone] in the [place] for Spanish-speaking counselors.  5-284-132-4MWN1-800-799-4TTY 9372576605(1-986-138-5501) in the [place] for TTY users.  Visit the following websites for information and help:  National Suicide Prevention Lifeline: www.suicidepreventionlifeline.org  Hopeline: www.hopeline.com  [employer] for Suicide Prevention: https://www.ayers.com/www.afsp.org  For lesbian, gay, bisexual, transgender, or questioning youth, contact The [employer]:  3-474-2-V-ZDGLOV1-866-4-U-TREVOR 516 379 6357(1-7041418522) in the [place].  www.thetrevorproject.org  In [place], treatment resources are listed in each province with listings available under [employer] for [employer] Services or similar titles. Another source for Crisis Centres by [place] is located at http://www.suicideprevention.ca/in-crisis-now/find-a-crisis-centre-now/crisis-centres Document Released: 08/19/2002 Document Revised: 05/07/2011 Document Reviewed: 06/09/2013 [facility] Patient Information 2015 [place], [place]. This information is not intended to replace advice given to you by your health care provider. Make sure you discuss any questions you have with your health care provSubstance Abuse Treatment Programs  Intensive Outpatient Programs [facility]     [address]      [place], [place]  [phone]       [facility] [address] [place] [address] [place],  [place] [phone]  [facility]     (Inpatient and outpatient)     [address]           [phone]    [facility] [phone] (Suboxone and Methadone)  [address]      [place], [place] [address]      [phone]       [address] [place], [place] [phone]  [facility] (Outpatient/Inpatient, Chemical)    (insurance only) [phone]             [facility] (Groups & Residential) [place], [place] [phone]     [facility]     [address]     [place], [place]      [phone]       [facility] (for caregivers and family) [phone] [address] [place], [place] [phone]      Residential Treatment Programs [facility]      [address], [place], [place] [address]  [phone]       [facility] [address], [place], [place] [address] [phone]  [facility] [place]        [phone]       [facility] [phone]  [facility] ([facility]             [address]                                         [place], [place]                                                [phone] or [phone]                               [facility] [place] [address] [place], [address] [phone]  [facility]    [address]      [place], [place]     [phone]       [facility] [address] [place], [place] [phone]  [facility]   [address] [place], [place] [address]     [phone]      Admissions: 8am-3pm M-F  [facility] (RTS) [address] [place], [place] [phone]  [facility]: Residential Program [phone] Days)   [place], [place]      [phone] or [phone]     [facility]: [facility] [employer], [place] (Walk in Hours over the weekend or by referral)  [facility] [address] [place], [place], [place] [address] [phone]  Crisis Mobile: [facility]:  [phone] (for crisis response 24 hours a day) [facility] Hotline:      715 382 5069 Outpatient Psychiatry and Counseling  [facility]: Mobile Crisis Management 24 hours:  [phone]  [facility] the [employer] sliding scale fee and walk in schedule: M-F 8am-12pm/1pm-3pm [address]  [place], [place] [address] [phone]  [facility] [address] [place], [place] [address] [phone]  [facility] (Formerly known as  [facility])- new patient walk-in appointments available Monday - Friday 8am -3pm.          [address] [place], [place] [address] [phone] or crisis line- [phone]  [facility] Out[facility] Services/ Intensive Outpatient Therapy Program [address] [place], [place] [address] [phone]  [facility]                  Crisis Services      [phone] [address]     [place], [place] [address]                 [facility]   [facility] [phone]. [address] [place], [place] [address]   [employer] [facility] of Care          [address] [place]  [place], [place] [address]       [phone]  [facility] [address] [place], [place] [address] [phone]  [facility]    [address]    [place], [place] [address]     [phone]       Andee Poles, MD     [address]     [place] [address]     [phone]       [facility] [address] [place] [address]  [facility]     [address] [place], [place]      [phone]       [facility] Eulogio Ditch, MD [address] [place], [place] [address] [phone]  [facility]     [address]     [place], [place] [address]     [phone]       [facility] [address] [place], [place] [address] [phone] Resources for Temporary Residential Assistance/Crisis Centers  DAY CENTERS [facility]) M-F 8am-3pm   [address] [place], [place] [address]   [phone] Services include: laundry, barbering, support groups, case management, phone  & computer access, showers, AA/NA mtgs, mental health/substance abuse nurse, job skills class, disability information, VA assistance, spiritual classes, etc.   HOMELESS SHELTERS  [facility]     [facility]   [address] [place]     [phone]              [employer] (women and children)       [address] [place], [place] [address] [phone] [email or web address] for application and process Application Required  [facility]   [address]    [place] [address]     [phone]                    [facility] [address] [place], [place] [address] [phone] application appt.) Application Required  [facility] (women only)    [address]     [place], [place] [address]     [phone]      Intake starts 6pm daily Need valid ID, SSC, & Police report [employer] [address] [place], [place] [phone] Application Required  [employer] (men only)     [address]      [place],  [place]     [phone]       [facility] [place] (Pregnant women only) [address] [place], [place] [phone]  [facility]      [address]      [place], [place] [address]     [phone]             [facility] [address] [place], [place] [phone] 90 day commitment/SA/Application process  [facility](men only)     [address]     [place], [place]     [phone]       Check-in at [facility] [address] [place], [place] [address] [phone] Men/Women/Women and Children must be there by 7 pm  [facility] [place], [place] [phone]

## 2014-07-25 NOTE — ED Notes (Signed)
Pt laid in floor in room. When pt got up, he walked to door frame and intentionally placed his forehead on it. Pt stable. Pt continues to stand in room and in doorway of room. Security, [employer], and M Scruggs, Consulting civil engineerCharge RN, standing by.

## 2014-07-25 NOTE — ED Notes (Signed)
PT ASKING FOR [employer] OFFICER TO SHOOT HIM.

## 2014-07-25 NOTE — ED Notes (Signed)
PT BECAME UPSET D/T IS UNABLE TO LEAVE. PT STATES HE WANTS [employer] TO SHOOT HIM. PT THREW LEMONADE IN FLOOR AND REMOVED ALL LINEN FROM CABINET AND THREW THEM ON THE FLOOR. SECURITY AND [employer] TALKING W/PT - PT ASKING [employer] OFFICER TO SHOOT HIM. PT REFUSES TO SIT IN ROOM - STANDING IN DOOR WAY. PT CLOSES DOOR TO ROOM THEN OPENS IT AGAIN.

## 2014-07-25 NOTE — ED Notes (Addendum)
PHARMACY ADVISED SENDING ZYPREXA.

## 2014-07-25 NOTE — ED Notes (Signed)
Pt banging head on door frame. Advised pt to stop, states 'they are telling me to do it'. [employer] and Security remain outside pt's room. Pt taunting [employer] without reciprocal. No noted injury to pt's forehead.

## 2014-07-25 NOTE — ED Notes (Signed)
Pt progressively becoming more and more agitated. Verbal de-escalation attempted with constant re-direction.

## 2014-07-25 NOTE — ED Notes (Signed)
All belongings returned to pt.

## 2014-07-25 NOTE — Consult Note (Signed)
[place]   Reason for Consult:  Reported suicidal ideations  Referring Physician:  EDP Patient Identification: [name] MRN:  [id number] Principal Diagnosis: Schizoaffective disorder, bipolar type Diagnosis:   Patient Active Problem List   Diagnosis Date Noted  . Schizoaffective disorder, bipolar type [F25.0]     Priority: High  . Suicide attempt [T14.91]   . Laceration of right upper arm [S41.111A]   . Suicidal thoughts [R45.851]   . Suicidal ideation [R45.851]   . Hallucinations [R44.3]   . Suicidal ideations [R45.851]   . 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 Asthma, chronic [J45.909] 03/25/2014  . Obesity, morbid [E66.01] 03/25/2014  . Drug overdose, intentional [T50.902A] 10/09/2013  . Aggression [F60.89] 08/22/2013    Total Time spent with patient: 50 minutes  Subjective:  [name] is an 24 y.o. male. Well-known to this Probation officer and ED staff presenting to the ED for the 21st time in 6 months. Pt reports that he left the ED and wanted to run into traffic, but that he is OK. Pt reports having spent the night in the ED and that now he is "not suicidal or homicidal anymore". Pt also denies psychosis and does not appear to be responding to internal stimuli. Pt states that he "feels much better" after having benadryl and zyprexa and that he would like to sign a no-harm contract and leave with his ACT Team.   HPI:  I have reviewed and concur with ED HPI notes and have modified as follows: [name] is an 24 y.o. male, single, African-American who was discharged from [facility] ED on the 27th. Pt has a history of schizoaffective disorder and repeated visits to the ED. Per Pt report and report by ED staff Pt was discharged and intentionally walked into traffic trying to kill himself by being hit by a vehicle. Pt reports that [place] escorted him out of traffic and brought him back to the emergency room. Pt states he is currently  experiencing command hallucinations berating him and telling him to kill himself by walking into traffic. Pt also reports he sees people that others do not and they appear real to him. Pt continues to verbalize suicidal ideation and states he is not safe to leave the hospital. Pt has a history of intentional self-injurious behaviors including cutting himself, biting himself and drinking hand sanitizer. He denies current homicidal ideation or thoughts of harming others. Pt denies any history of aggression but ED record indicates a history of aggressive behavior. Pt denies alcohol or substance abuse.  Past Medical History:  Past Medical History  Diagnosis Date  . Asthma   . Bipolar 1 disorder   . ODD (oppositional defiant disorder)   . ADHD (attention deficit hyperactivity disorder)   . Schizophrenia   . Suicidal ideation   . Homicidal ideation   . Explosive personality disorder    History reviewed. No pertinent past surgical history. Family History:  Family History  Problem Relation Age of Onset  . Asthma Mother   . Asthma Sister   . Thyroid disease     Social History:  History  Alcohol Use  . 0.0 oz/week  . 0 Standard drinks or equivalent per week    Comment: pt reported has not drank since last week     History  Drug Use No    Comment: Patient denies     History   Social History  . Marital Status: Single    Spouse Name: N/A  .  Number of Children: N/A  . Years of Education: N/A   Social History Main Topics  . Smoking status: Never Smoker   . Smokeless tobacco: Not on file  . Alcohol Use: 0.0 oz/week    0 Standard drinks or equivalent per week     Comment: pt reported has not drank since last week  . Drug Use: No     Comment: Patient denies   . Sexual Activity: Not on file   Other Topics Concern  . None   Social History Narrative   Additional Social History:                          Allergies:   Allergies  Allergen Reactions  . Carbamazepine  Anaphylaxis, Other (See Comments) and Cough    Cough up blood  . Geodon [Ziprasidone Hcl] Anaphylaxis and Other (See Comments)    " Causes my throat to close"  . Haldol [Haloperidol Lactate] Anaphylaxis and Other (See Comments)    Patient reports that he stopped taking this because of throat swelling.   Christianne Borrow [Atomoxetine] Anaphylaxis, Other (See Comments) and Cough    Cough up blood  . Other Nausea Only and Other (See Comments)    Apple Juice, stomach hurts and itchy throat   . Shellfish Allergy Swelling    Throat swelling  . Food Nausea And Vomiting    Lamb/mutton.     Labs:  Results for orders placed or performed during the hospital encounter of 07/23/14 (from the past 48 hour(s))  CBC WITH DIFFERENTIAL     Status: None   Collection Time: 07/23/14  8:24 PM  Result Value Ref Range   WBC 6.9 4.0 - 10.5 K/uL   RBC 5.60 4.22 - 5.81 MIL/uL   Hemoglobin 15.2 13.0 - 17.0 g/dL   HCT 44.5 39.0 - 52.0 %   MCV 79.5 78.0 - 100.0 fL   MCH 27.1 26.0 - 34.0 pg   MCHC 34.2 30.0 - 36.0 g/dL   RDW 13.8 11.5 - 15.5 %   Platelets 331 150 - 400 K/uL   Neutrophils Relative % 64 43 - 77 %   Neutro Abs 4.4 1.7 - 7.7 K/uL   Lymphocytes Relative 28 12 - 46 %   Lymphs Abs 1.9 0.7 - 4.0 K/uL   Monocytes Relative 6 3 - 12 %   Monocytes Absolute 0.4 0.1 - 1.0 K/uL   Eosinophils Relative 2 0 - 5 %   Eosinophils Absolute 0.1 0.0 - 0.7 K/uL   Basophils Relative 0 0 - 1 %   Basophils Absolute 0.0 0.0 - 0.1 K/uL  Comprehensive metabolic panel     Status: None   Collection Time: 07/23/14  8:24 PM  Result Value Ref Range   Sodium 139 135 - 145 mmol/L   Potassium 4.2 3.5 - 5.1 mmol/L   Chloride 104 101 - 111 mmol/L   CO2 25 22 - 32 mmol/L   Glucose, Bld 94 65 - 99 mg/dL   BUN 7 6 - 20 mg/dL   Creatinine, Ser 1.01 0.61 - 1.24 mg/dL   Calcium 9.5 8.9 - 10.3 mg/dL   Total Protein 7.7 6.5 - 8.1 g/dL   Albumin 4.5 3.5 - 5.0 g/dL   AST 30 15 - 41 U/L   ALT 35 17 - 63 U/L   Alkaline Phosphatase  81 38 - 126 U/L   Total Bilirubin 0.4 0.3 - 1.2 mg/dL   GFR calc non  Af Amer >60 >60 mL/min   GFR calc Af Amer >60 >60 mL/min    Comment: (NOTE) The eGFR has been calculated using the CKD EPI equation. This calculation has not been validated in all clinical situations. eGFR's persistently <60 mL/min signify possible Chronic Kidney Disease.    Anion gap 10 5 - 15  Ethanol     Status: None   Collection Time: 07/23/14  8:24 PM  Result Value Ref Range   Alcohol, Ethyl (B) <5 <5 mg/dL    Comment:        LOWEST DETECTABLE LIMIT FOR SERUM ALCOHOL IS 11 mg/dL FOR MEDICAL PURPOSES ONLY   I-stat troponin, ED     Status: None   Collection Time: 07/24/14  1:20 AM  Result Value Ref Range   Troponin i, poc 0.00 0.00 - 0.08 ng/mL   Comment 3            Comment: Due to the release kinetics of cTnI, a negative result within the first hours of the onset of symptoms does not rule out myocardial infarction with certainty. If myocardial infarction is still suspected, repeat the test at appropriate intervals.     Vitals: Blood pressure 132/88, pulse 85, temperature 99.1 F (37.3 C), temperature source Oral, resp. rate 17, SpO2 98 %.  Risk to Self: Suicidal Ideation: Yes-Currently Present Suicidal Intent: Yes-Currently Present Is patient at risk for suicide?: Yes Suicidal Plan?: Yes-Currently Present Specify Current Suicidal Plan: Pt walked into traffictrying to be hit by a car. Access to Means: Yes Specify Access to Suicidal Means: Pt walked into traffic What has been your use of drugs/alcohol within the last 12 months?: Pt reports occasional alcohol use How many times?: 11 Other Self Harm Risks: Cutting, biting, scratching Triggers for Past Attempts: Unpredictable, Hallucinations Intentional Self Injurious Behavior: Cutting, Damaging Comment - Self Injurious Behavior: Pt has history of cutting Risk to Others: Homicidal Ideation: No Thoughts of Harm to Others: No Comment - Thoughts of  Harm to Others: Pt denies current thoughts of harming others Current Homicidal Intent: No Current Homicidal Plan: No Describe Current Homicidal Plan: None Access to Homicidal Means: No Describe Access to Homicidal Means: Pt denies access to guns Identified Victim: None History of harm to others?: No Assessment of Violence: None Noted Violent Behavior Description: Pt denies history of aggression Does patient have access to weapons?: No Criminal Charges Pending?: No Does patient have a court date: No Prior Inpatient Therapy: Prior Inpatient Therapy: Yes Prior Therapy Dates: multiple since young child Prior Therapy Facilty/Provider(s): [place], [place], [place] Reason for Treatment: Psychosis, Aggression. SI Prior Outpatient Therapy: Prior Outpatient Therapy: Yes Prior Therapy Dates: Ongoing Prior Therapy Facilty/Provider(s): [facility] with [facility], [facility] Reason for Treatment: Med Mgmt, Therapy Does patient have an ACCT team?: Yes Does patient have Intensive In-House Services?  : No Does patient have [facility] services? : No Does patient have [facility] services?: No  Current Facility-Administered Medications  Medication Dose Route Frequency Provider Last Rate Last Dose  . acetaminophen (TYLENOL) tablet 650 mg  650 mg Oral Q6H PRN Lacretia Leigh, MD   650 mg at 07/24/14 2049  . albuterol (PROVENTIL HFA;VENTOLIN HFA) 108 (90 BASE) MCG/ACT inhaler 1 puff  1 puff Inhalation Q6H PRN Hanna Patel-Mills, PA-C      . alum & mag hydroxide-simeth (MAALOX/MYLANTA) 200-200-20 MG/5ML suspension 30 mL  30 mL Oral PRN Tanna Furry, MD   30 mL at 07/25/14 0855  . fluticasone (FLOVENT HFA) 110 MCG/ACT inhaler 2  puff  2 puff Inhalation BID Ottie Glazier, PA-C   2 puff at 07/25/14 0856  . MUSCLE RUB CREA   Topical PRN Fredia Sorrow, MD      . OLANZapine zydis (ZYPREXA) disintegrating tablet 5 mg  5 mg Oral Daily Sherwood Gambler, MD   5 mg at 07/25/14 1357  . QUEtiapine (SEROQUEL) tablet 100 mg  100 mg  Oral BID Hanna Patel-Mills, PA-C   100 mg at 07/25/14 1022  . sterile water (preservative free) injection 2.1 mL  2.1 mL Injection Once Sherwood Gambler, MD   2.1 mL at 07/25/14 1341   Current Outpatient Prescriptions  Medication Sig Dispense Refill  . albuterol (PROVENTIL HFA;VENTOLIN HFA) 108 (90 BASE) MCG/ACT inhaler Inhale 1 puff into the lungs every 6 (six) hours as needed for wheezing or shortness of breath. 18 g 3  . ARIPiprazole 400 MG SUSR Inject 400 mg into the muscle every 30 (thirty) days.    . DiphenhydrAMINE HCl (BENADRYL PO) Take 1 tablet by mouth daily as needed (sleep,allergies).     . EPINEPHrine (EPIPEN 2-PAK IJ) Inject 1 each as directed as needed (anaphylaxis).     . fluticasone (FLOVENT HFA) 110 MCG/ACT inhaler Inhale 2 puffs into the lungs every 12 (twelve) hours. 1 Inhaler 12  . QUEtiapine (SEROQUEL) 100 MG tablet Take 100 mg by mouth 2 (two) times daily.     . Vitamin D, Ergocalciferol, (DRISDOL) 50000 UNITS CAPS capsule Take 1 capsule (50,000 Units total) by mouth every 7 (seven) days. (Patient not taking: Reported on 05/18/2014) 12 capsule 0   Musculoskeletal: UTO, camera  Psychiatric Specialty Exam:   Review of Systems  Psychiatric/Behavioral: Positive for depression. Negative for suicidal ideas. The patient is nervous/anxious.   All other systems reviewed and are negative.    BP 132/88 mmHg  Pulse 85  Temp(Src) 99.1 F (37.3 C) (Oral)  Resp 17  SpO2 98%   General Appearance: Casual  Eye Contact::  Good  Speech:  Normal Rate  Volume:  Normal  Mood:  Euthymic  Affect:  Congruent  Thought Process:  Coherent  Orientation:  Full (Time, Place, and Person)  Thought Content:  WDL  Suicidal Thoughts:  Denies  Homicidal Thoughts:  No  Memory:  Immediate;   Good Recent;   Good Remote;   Good  Judgement:  Fair  Insight:  Fair  Psychomotor Activity:  Normal  Concentration:  Good  Recall:  Good  Fund of Knowledge:Good  Language: Good  Akathisia:  No   Handed:  Right  AIMS (if indicated):     Assets:  Catering manager Housing Leisure Time Physical Health Resilience Social Support  Sleep:     Cognition: WNL  ADL's:  Intact   Treatment Plan Summary: Schizoaffective disorder, bipolar type stable, baseline, treated with abilify and seroquel  Plan:  No evidence of imminent risk to self or others at present.   Patient does not meet criteria for psychiatric inpatient admission. Supportive therapy provided about ongoing stressors. Discussed crisis plan, support from social network, calling 911, coming to the Emergency Department, and calling Suicide Hotline. Send home with ACT TEAM  Disposition: Discharge to ACT Team  *Case reviewed with Dr. Micheline Maze, John C,FNP-BC 07/25/2014 2:30 PM I have been consulted about this patient and agree with the assessment and plan Geralyn Flash A. [place].D.

## 2014-07-25 NOTE — ED Notes (Signed)
Pharmacy to send Zyprexa.

## 2014-07-25 NOTE — ED Notes (Addendum)
DR Criss AlvineGOLDSTON IN POD C ASSESSING PT AND ADVISING ORDER FOR ZYPREXA AND BENADRYL CHANGED FROM IM TO PO.

## 2014-07-25 NOTE — ED Provider Notes (Signed)
Patient is becoming increasingly agitated. He is able to be redirected sometimes but will try and give oral Zyprexa and reconsult psych. Patient currently under IVC.  Pricilla LovelessScott Vernona Peake, MD 07/25/14 1341

## 2014-07-25 NOTE — ED Notes (Addendum)
Pt refused for injections to be placed in hip or legs. Insisted on being given IM into deltoids. Pt noted w/pinkish-red area to mid upper forehead. States feels fine. Pt apologized for behavior. States "I know I shouldn't have acted out but I was mad."

## 2014-07-25 NOTE — ED Provider Notes (Signed)
Patient has been in the ER for some time for psychiatric evaluation. Patient is frequently seen in this ER after making statements about wanting to harm himself. He was evaluated by psychiatry earlier today. Dr. Dub MikesLugo had felt that the patient can be discharged to home. He is well-known to the psychiatry service and has chronic suicidal ideations without any intent to harm himself, according to psychiatry. It was recommended that the involuntary commitment be rescinded and he be sent home with his ACT team. The ACT team however did not look notable taking the patient into his home environment. Patient has been monitored here in the ER for the remainder of the day. He has been intermittently agitated because he wants to leave, but has adamantly denied any thoughts of harming himself or anyone else. He has made contact with his mother who agrees that he was not a harm to himself and will come to pick him up. Patient contracts for safety. His mother agrees to take him home and he was therefore discharged into the care of his mother.  Gilda Creasehristopher J Kimberlee Shoun, MD 07/25/14 2242

## 2014-07-25 NOTE — ED Notes (Signed)
Pt sitter ask for plain paper for pt to draw on, pt given 3 sheets.

## 2014-07-25 NOTE — ED Notes (Signed)
IVC rescind paper filled out and signed by MD and placed in file for clerk and copy placed for medical records.

## 2014-07-25 NOTE — Progress Notes (Signed)
CSW contacted [facility] to request service to receive patient as he has been cleared for discharge.  ACTT clinician stated that at this time they are not in support of discharge and are supportive of the IVC and [facility]  CSW informed ACTT clinician that patient states that the Zyprexa has resolved his A/H and he is no longer having any S/I or H/I.  ACTT clinician stated at this time they were not coming to transport patient, but would check with their supervisor, Trey PaulaJeff and call back to this CSW.  CSW relayed input from Nursing staff and ACTT staff to psych, who is still in support of discharge.  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  This pattern is the patient calling, "3 out of 5 nights with suicidal ideation, most of the time we go out and get him to take medication and he calms down.  When he is discharged from the ED, within 3 hours he is having a drink, feeling suicidal, and walking into traffic.  We think he is in need of [facility] inpatient stay."  CSW will follow up with ACTT team in order to safely discharge patient.  Seward SpeckLeo Shakeila Pfarr Bet[facility] Park Surgery CenterCSW,LCAS Behavioral Health Disposition CSW [phone]

## 2014-07-25 NOTE — ED Notes (Signed)
Pt is standing in the door way of [address] and hitting his head on the door frame.

## 2014-07-25 NOTE — ED Notes (Addendum)
ADVISED PT OF TX PLAN, AS PER DR POLLINA - STAYING THE NIGHT AND WILL BE RE-ASSESSED TOMORROW. PT UPSET, CALLED HIS ACT TEAM, SLAMMED THE PHONE AND CURSED. PT RETURNED TO ROOM, YELLING. SECURITY AND OFF-DUTY [employer] AT BEDSIDE. PT YELLING AT THEM. DR Dayna BarkerPOLLINA AWARE. PT'S DINNER TRAY REMOVED FROM ROOM - PT STATES HE DOES NOT WANT TO EAT IT ANYWAY.

## 2014-07-25 NOTE — ED Notes (Signed)
No harm contract filled out and signed by pt, mom and this Charity fundraiserN. Original placed in Medical records and [facility].

## 2014-11-09 IMAGING — CR DG WRIST COMPLETE 3+V*L*
4 series · 4 of 4 positions shown · non-contrast
Comparison: None.

CLINICAL DATA: Fell 3 days ago.  Left wrist pain.

EXAM:
LEFT WRIST - COMPLETE 3+ VIEW

[x wrist pa left]
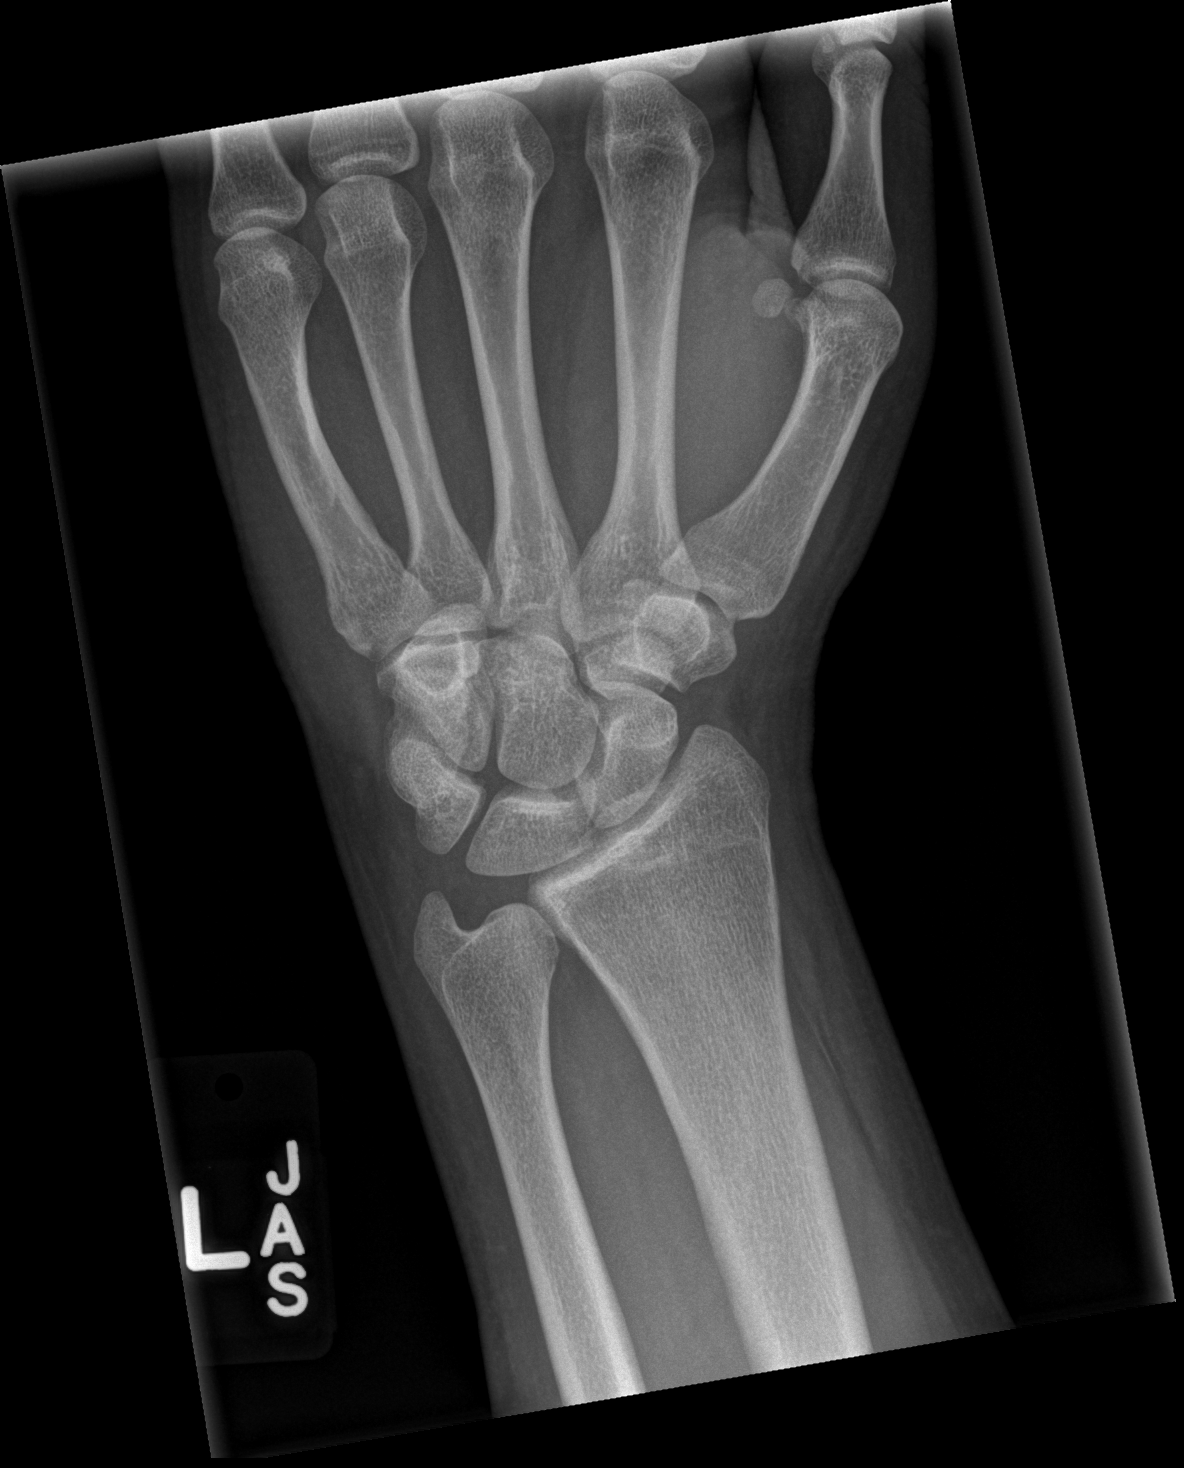

[x wrist obl left]
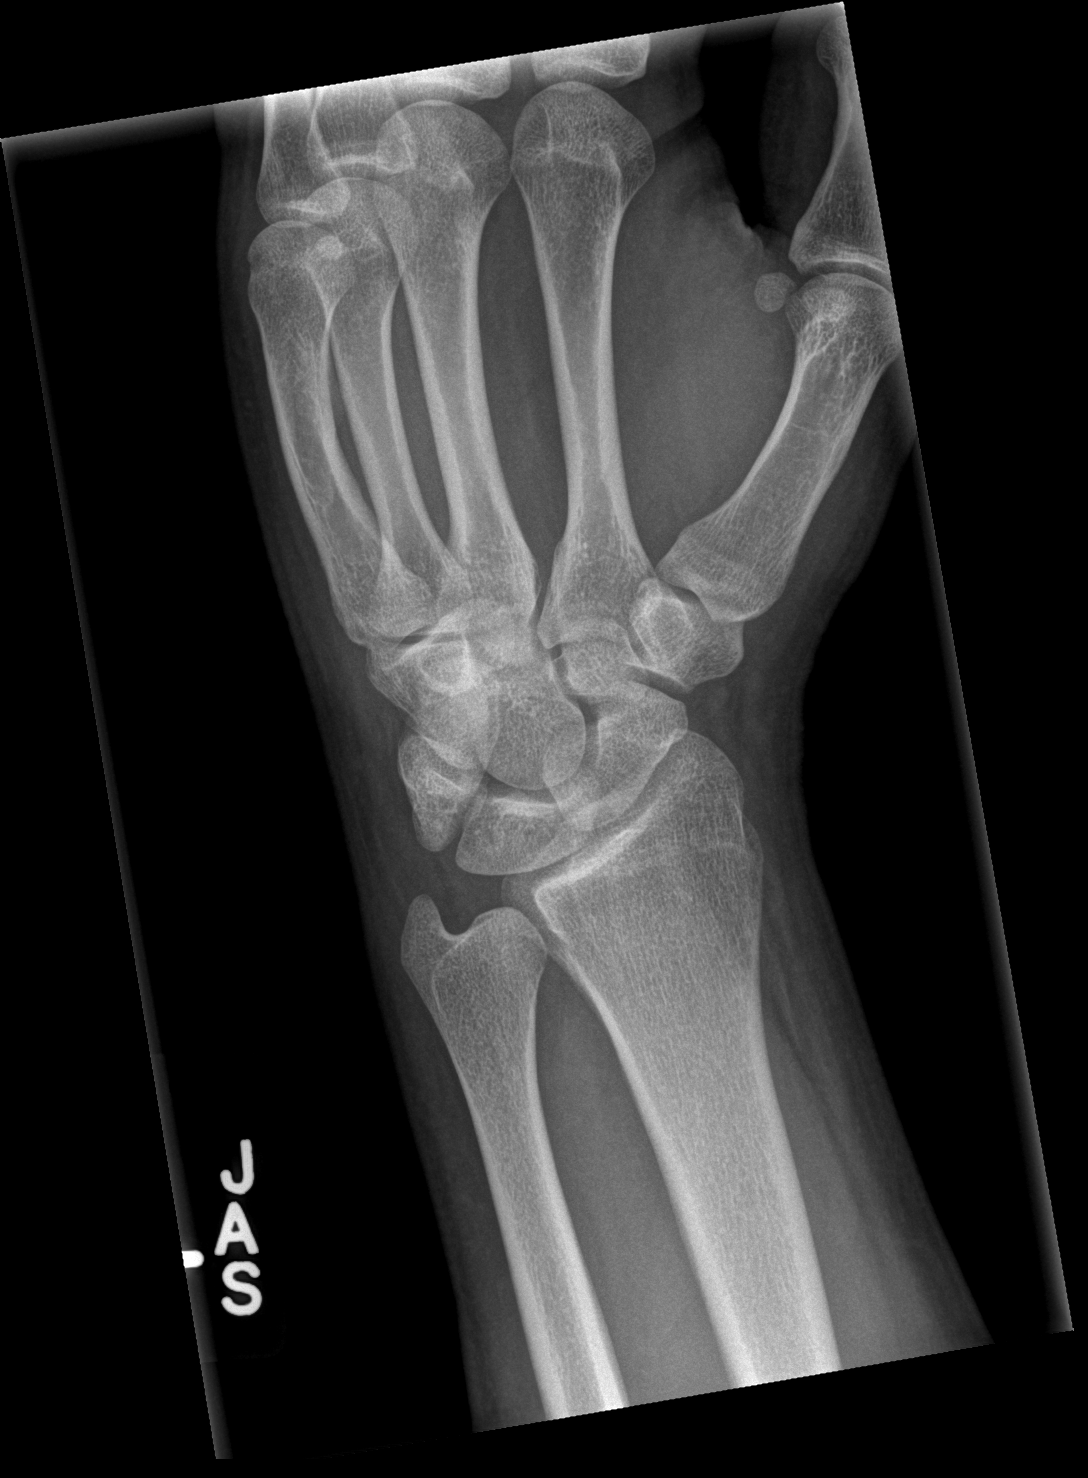

[x wrist lat left]
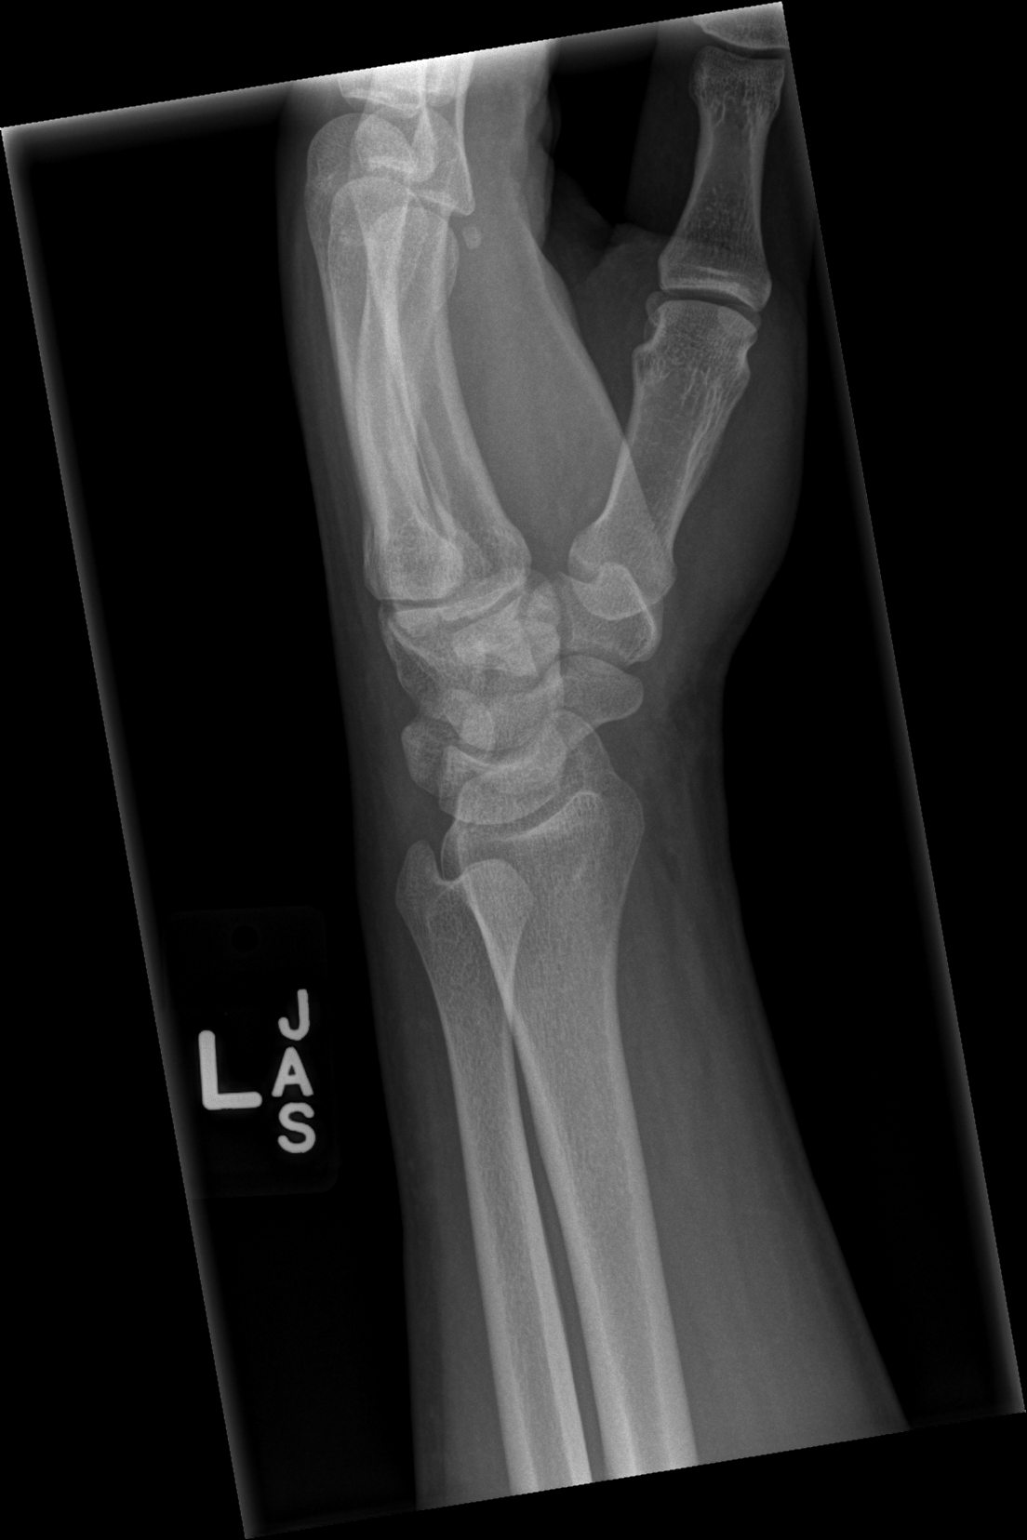

[x wrist navicular view left]
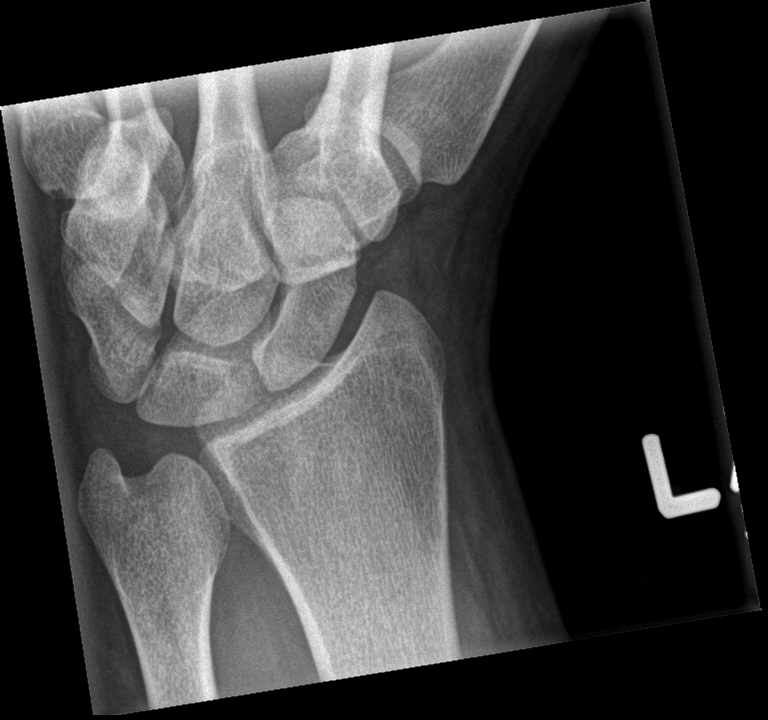

[4 of 4 positions shown; findings below may reference images not displayed]

FINDINGS: There is no evidence of fracture or dislocation. There is no
evidence of arthropathy or other focal bone abnormality. Soft
tissues are unremarkable.
IMPRESSION: Negative.

## 2015-08-04 IMAGING — CR DG CHEST 2V
2 series · 2 of 2 positions shown · non-contrast
Comparison: None.

CLINICAL DATA: Altered mental status.  Chest pain.

EXAM:
CHEST  2 VIEW

[w chest pa]
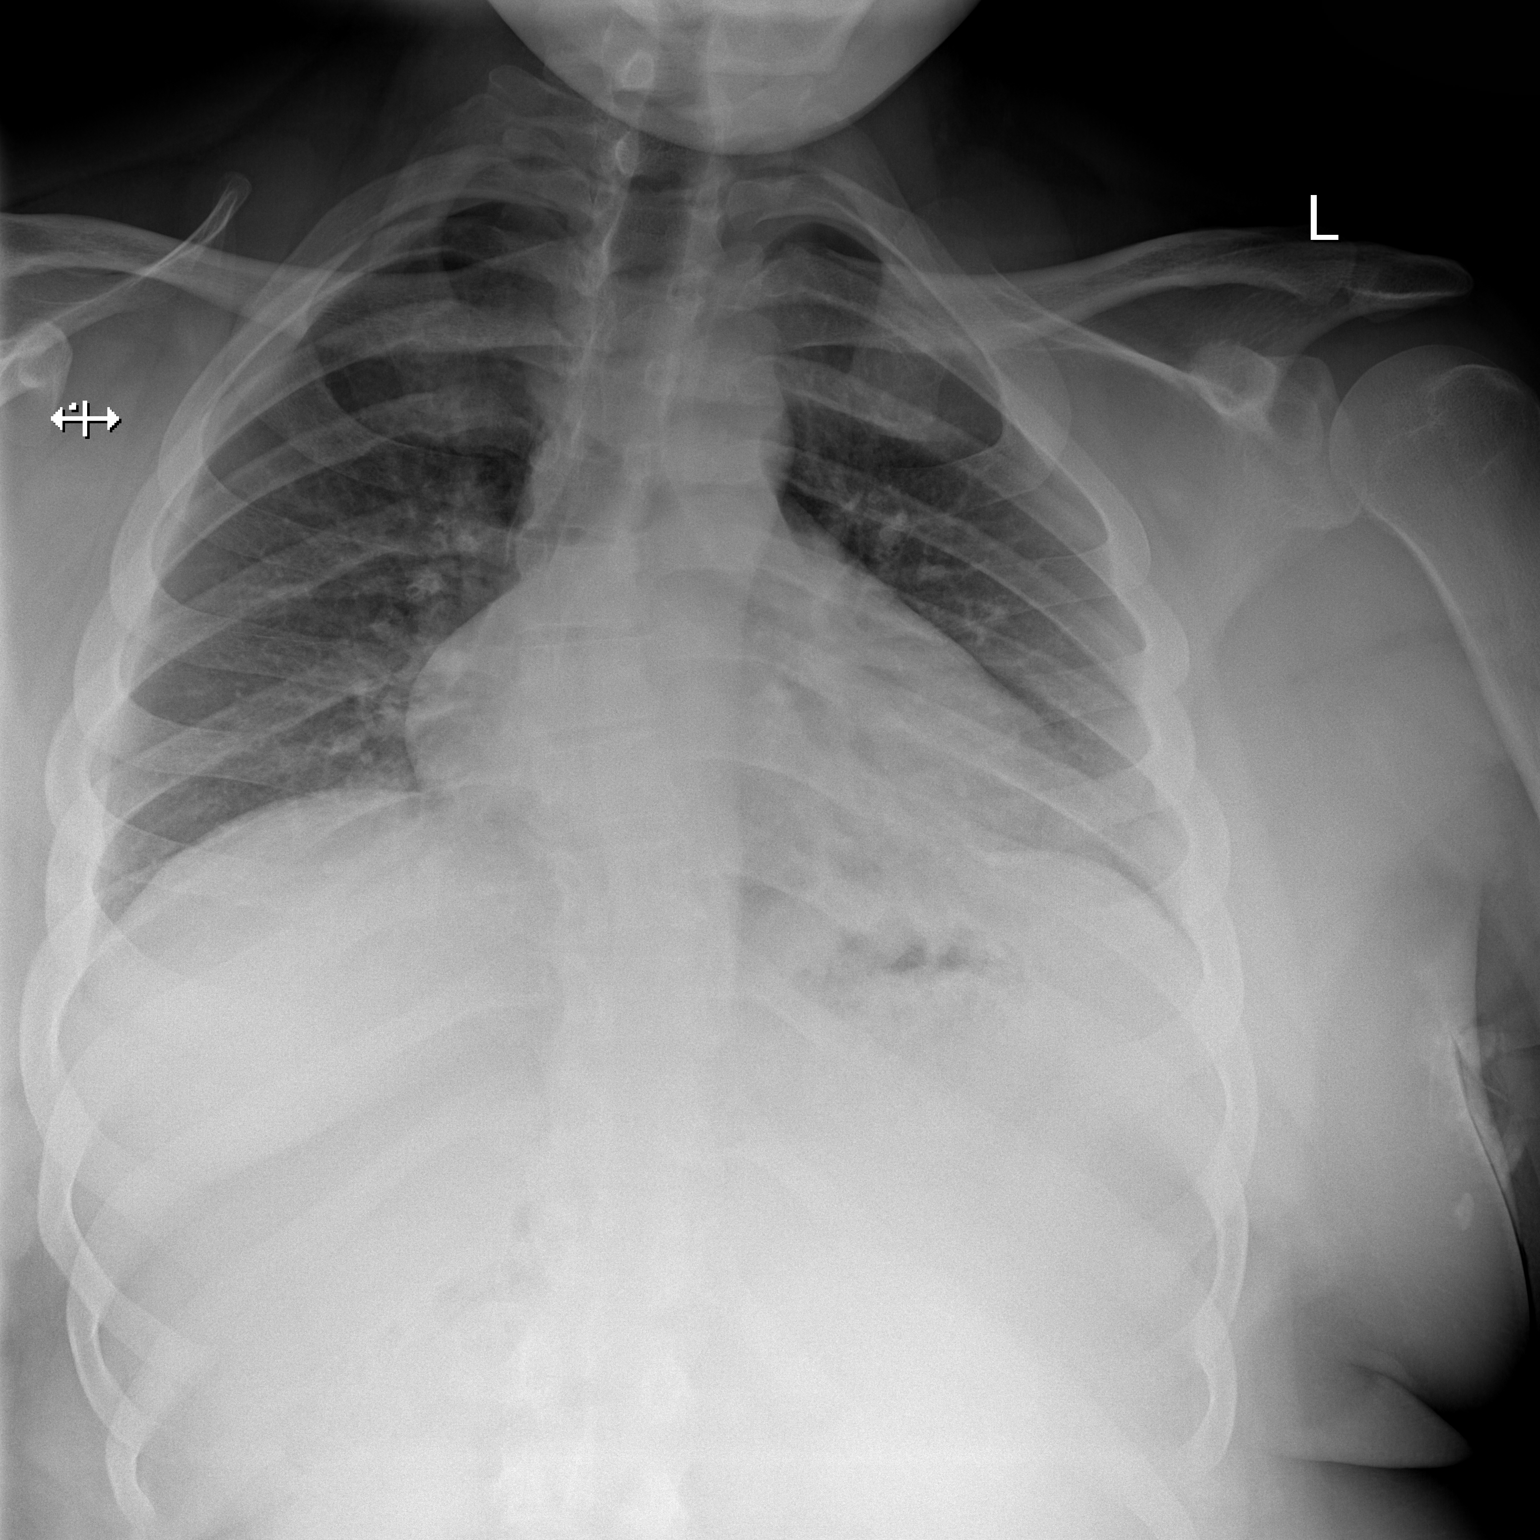

[w chest lat]
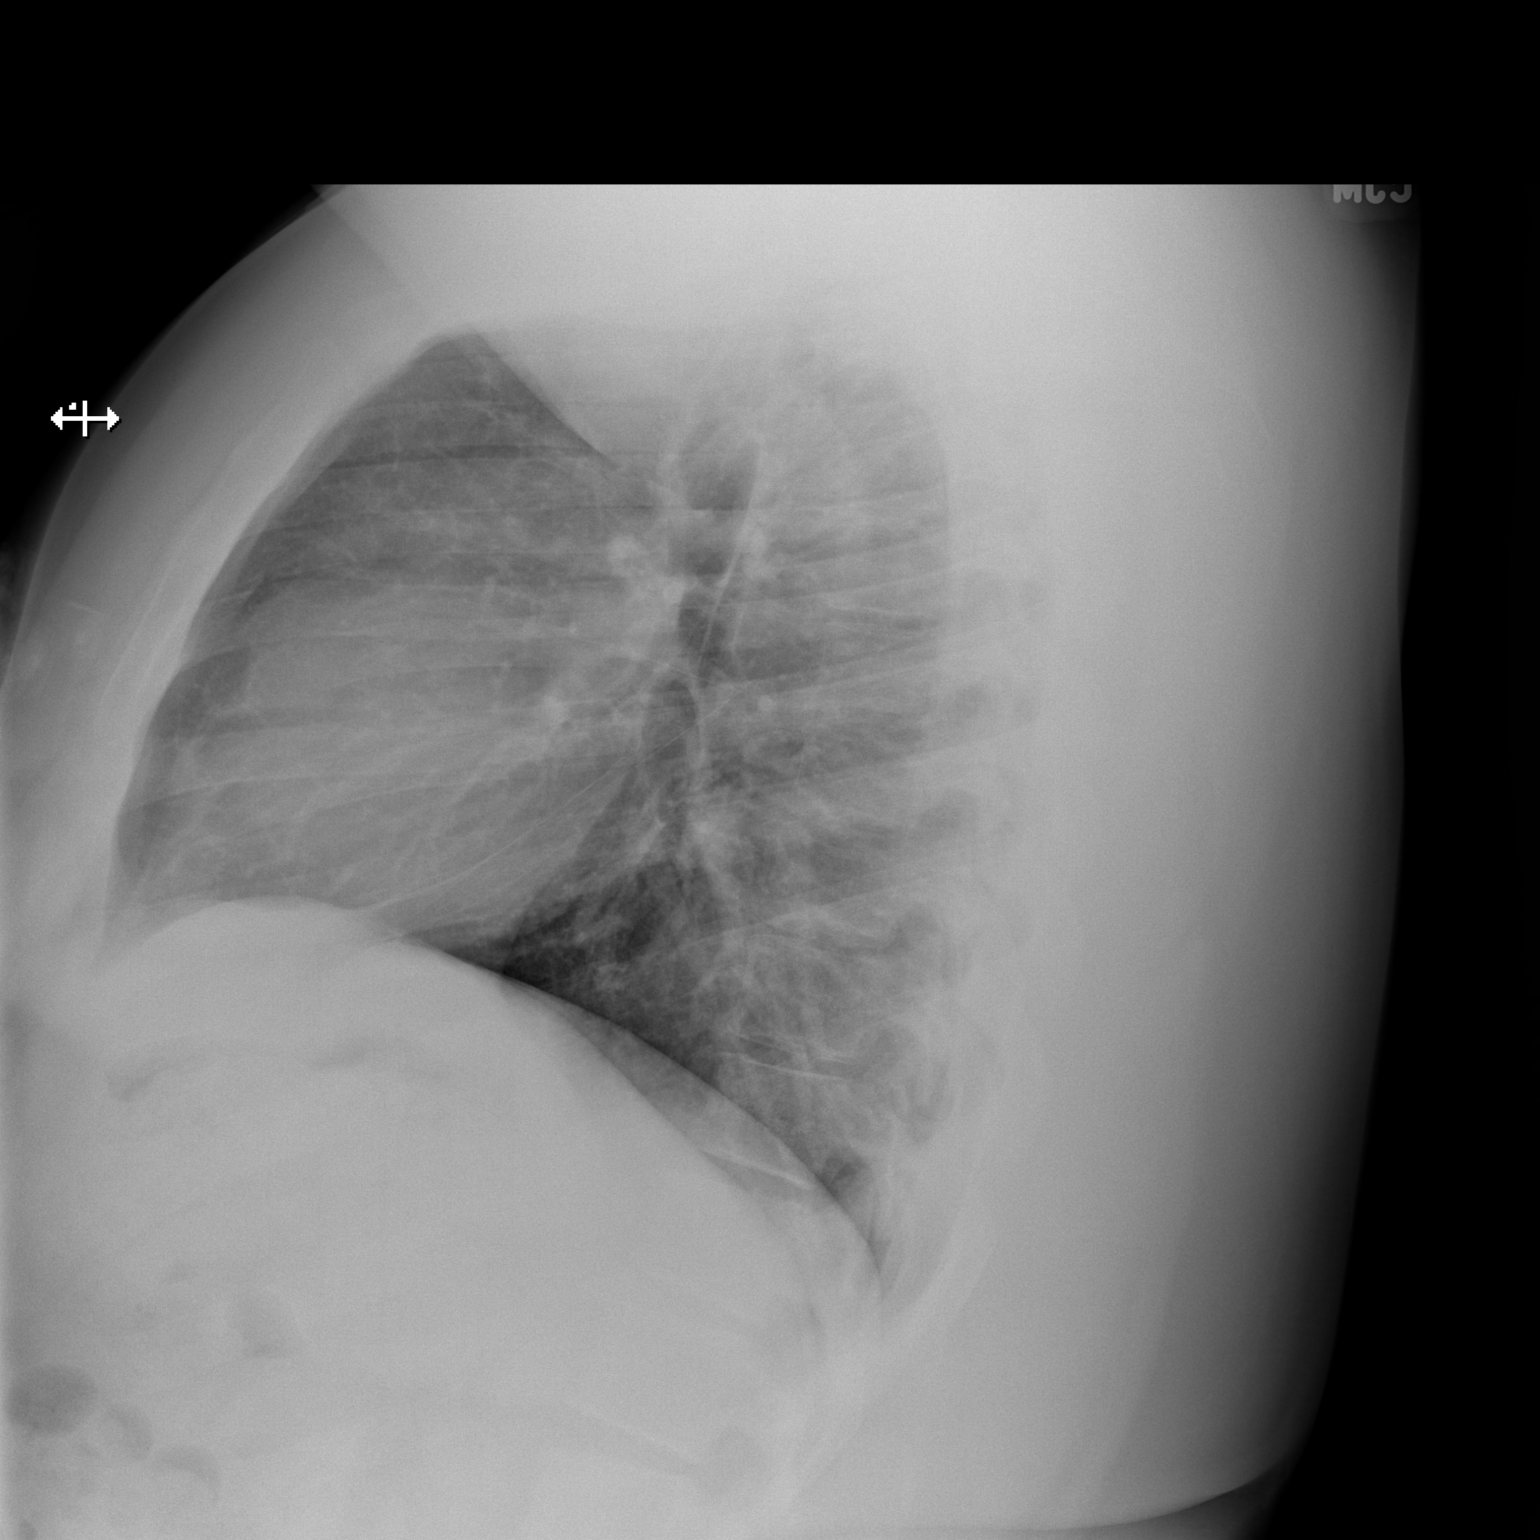

[2 of 2 positions shown; findings below may reference images not displayed]

FINDINGS: The heart size and mediastinal contours are within normal limits.
Both lungs are clear. No pleural effusion or pneumothorax. Mild
dextroscoliosis of the thoracic spine.
IMPRESSION: No active cardiopulmonary disease.
